# Patient Record
Sex: Female | Born: 1985 | Race: White | Hispanic: No | Marital: Single | Smoking: Light tobacco smoker
Health system: Southern US, Community
[De-identification: ages and names within clinical notes are randomized; demographics above are authoritative.]

## PROBLEM LIST (undated history)

## (undated) ENCOUNTER — Inpatient Hospital Stay (HOSPITAL_COMMUNITY): Payer: Self-pay

## (undated) DIAGNOSIS — F909 Attention-deficit hyperactivity disorder, unspecified type: Secondary | ICD-10-CM

## (undated) DIAGNOSIS — F419 Anxiety disorder, unspecified: Secondary | ICD-10-CM

## (undated) DIAGNOSIS — I079 Rheumatic tricuspid valve disease, unspecified: Secondary | ICD-10-CM

## (undated) DIAGNOSIS — F1990 Other psychoactive substance use, unspecified, uncomplicated: Secondary | ICD-10-CM

## (undated) DIAGNOSIS — I330 Acute and subacute infective endocarditis: Secondary | ICD-10-CM

## (undated) DIAGNOSIS — F429 Obsessive-compulsive disorder, unspecified: Secondary | ICD-10-CM

## (undated) DIAGNOSIS — I059 Rheumatic mitral valve disease, unspecified: Secondary | ICD-10-CM

## (undated) DIAGNOSIS — Z3490 Encounter for supervision of normal pregnancy, unspecified, unspecified trimester: Secondary | ICD-10-CM

## (undated) DIAGNOSIS — F4481 Dissociative identity disorder: Secondary | ICD-10-CM

## (undated) DIAGNOSIS — M419 Scoliosis, unspecified: Secondary | ICD-10-CM

## (undated) DIAGNOSIS — F3112 Bipolar disorder, current episode manic without psychotic features, moderate: Secondary | ICD-10-CM

## (undated) HISTORY — PX: KIDNEY STONE SURGERY: SHX686

---

## 2015-01-19 NOTE — L&D Delivery Note (Signed)
Delivery Note Pt experienced recurrent variable decels to FHR in 60s.  Pt's anterior lip was reduced after brief second stage.  At 11:01 PM a viable female was delivered via Vaginal, Spontaneous Delivery LOA.  APGAR:8,9 weight pending. Placenta status: intact, spontaneous delivery   3 vessel Cord: Delayed cord clamping after 1 minute.  Anesthesia:  epidural Episiotomy: None Lacerations: None Suture Repair: none Est. Blood Loss (mL): 100  Mom to postpartum.  Baby to Couplet care / Skin to Skin.  Clayton BiblesSamantha Anastasio Wogan, SNM 11/06/2015, 11:18 PM

## 2015-09-26 ENCOUNTER — Encounter (HOSPITAL_COMMUNITY): Payer: Self-pay | Admitting: *Deleted

## 2015-09-26 ENCOUNTER — Inpatient Hospital Stay (HOSPITAL_COMMUNITY)
Admission: AD | Admit: 2015-09-26 | Discharge: 2015-09-26 | Disposition: A | Discharge status: Home / Self Care | Payer: Medicaid Other | Source: Ambulatory Visit | Attending: Obstetrics & Gynecology | Admitting: Obstetrics & Gynecology

## 2015-09-26 DIAGNOSIS — O99323 Drug use complicating pregnancy, third trimester: Secondary | ICD-10-CM | POA: Diagnosis not present

## 2015-09-26 DIAGNOSIS — O99333 Smoking (tobacco) complicating pregnancy, third trimester: Secondary | ICD-10-CM | POA: Insufficient documentation

## 2015-09-26 DIAGNOSIS — N898 Other specified noninflammatory disorders of vagina: Secondary | ICD-10-CM | POA: Diagnosis not present

## 2015-09-26 DIAGNOSIS — O26899 Other specified pregnancy related conditions, unspecified trimester: Secondary | ICD-10-CM

## 2015-09-26 DIAGNOSIS — M419 Scoliosis, unspecified: Secondary | ICD-10-CM | POA: Diagnosis not present

## 2015-09-26 DIAGNOSIS — O26893 Other specified pregnancy related conditions, third trimester: Secondary | ICD-10-CM | POA: Diagnosis not present

## 2015-09-26 DIAGNOSIS — R109 Unspecified abdominal pain: Secondary | ICD-10-CM | POA: Diagnosis present

## 2015-09-26 DIAGNOSIS — Z888 Allergy status to other drugs, medicaments and biological substances status: Secondary | ICD-10-CM | POA: Diagnosis not present

## 2015-09-26 DIAGNOSIS — Z3A33 33 weeks gestation of pregnancy: Secondary | ICD-10-CM | POA: Diagnosis not present

## 2015-09-26 DIAGNOSIS — Z79899 Other long term (current) drug therapy: Secondary | ICD-10-CM | POA: Diagnosis not present

## 2015-09-26 DIAGNOSIS — Z3689 Encounter for other specified antenatal screening: Secondary | ICD-10-CM

## 2015-09-26 DIAGNOSIS — O99320 Drug use complicating pregnancy, unspecified trimester: Secondary | ICD-10-CM | POA: Diagnosis present

## 2015-09-26 HISTORY — DX: Scoliosis, unspecified: M41.9

## 2015-09-26 LAB — URINALYSIS, DIPSTICK ONLY
BILIRUBIN URINE: NEGATIVE
GLUCOSE, UA: NEGATIVE mg/dL
Ketones, ur: NEGATIVE mg/dL
Nitrite: NEGATIVE
PROTEIN: NEGATIVE mg/dL
pH: 6 (ref 5.0–8.0)

## 2015-09-26 LAB — DIFFERENTIAL
Basophils Absolute: 0 10*3/uL (ref 0.0–0.1)
Basophils Relative: 0 %
EOS ABS: 0 10*3/uL (ref 0.0–0.7)
EOS PCT: 0 %
Lymphocytes Relative: 23 %
Lymphs Abs: 2.1 10*3/uL (ref 0.7–4.0)
MONO ABS: 0.2 10*3/uL (ref 0.1–1.0)
Monocytes Relative: 3 %
NEUTROS PCT: 74 %
Neutro Abs: 6.6 10*3/uL (ref 1.7–7.7)

## 2015-09-26 LAB — OB RESULTS CONSOLE ANTIBODY SCREEN: Antibody Screen: NEGATIVE

## 2015-09-26 LAB — CBC
HCT: 34.3 % — ABNORMAL LOW (ref 36.0–46.0)
Hemoglobin: 11.7 g/dL — ABNORMAL LOW (ref 12.0–15.0)
MCH: 30 pg (ref 26.0–34.0)
MCHC: 34.1 g/dL (ref 30.0–36.0)
MCV: 87.9 fL (ref 78.0–100.0)
PLATELETS: 175 10*3/uL (ref 150–400)
RBC: 3.9 MIL/uL (ref 3.87–5.11)
RDW: 12.8 % (ref 11.5–15.5)
WBC: 8.9 10*3/uL (ref 4.0–10.5)

## 2015-09-26 LAB — OB RESULTS CONSOLE ABO/RH

## 2015-09-26 LAB — RAPID URINE DRUG SCREEN, HOSP PERFORMED
Amphetamines: NOT DETECTED
BARBITURATES: NOT DETECTED
Benzodiazepines: NOT DETECTED
Cocaine: NOT DETECTED
Opiates: POSITIVE — AB
Tetrahydrocannabinol: POSITIVE — AB

## 2015-09-26 LAB — WET PREP, GENITAL
CLUE CELLS WET PREP: NONE SEEN
Sperm: NONE SEEN
TRICH WET PREP: NONE SEEN
YEAST WET PREP: NONE SEEN

## 2015-09-26 LAB — OB RESULTS CONSOLE RPR: RPR: NONREACTIVE

## 2015-09-26 NOTE — MAU Note (Signed)
Has had some PNC in [place]. Sporadic care. No care in [place]. Today having some abd pain in lower abd and some lower back pain. Feel like I'm leaking fld for last 2 days and alittle more today.

## 2015-09-26 NOTE — MAU Provider Note (Signed)
History     CSN: [id number]  Arrival date and time: 09/26/15 1843   First Provider Initiated Contact with Patient 09/26/15 2001      Chief Complaint  Patient presents with  . Abdominal Pain  . Vaginal Discharge   30 y.o. [id number] at [redacted]w[redacted]d here for evaluation of leaking of fluid and vaginal discharge for three days; noticed white-clear thin discharge occasionally over the past few days. She has minimal prenatal care, just moved from [place]. Reports seeing a midwife a few times, but mostly going to ER multiple times "to make sure everything is okay".  No bleeding. Also reports mild lower abdominal cramping occasionally.  No contractions. Good fetal movement.   Obstetric History   G5   P0   T0   P0   A3   L1    SAB0   TAB0   Ectopic0   Multiple0   Live Births1     # Outcome Date GA Lbr Len/2nd Weight Sex Delivery Anes PTL Lv  5 Current           4 Gravida 2007     Vag-Spont  Y LIV  3 AB           2 AB           1 AB                Past Medical History:  Diagnosis Date  . Anemia   . Scoliosis     Past Surgical History:  Procedure Laterality Date  . KIDNEY STONE SURGERY      Family History  Problem Relation Age of Onset  . Hypertension Mother   . Hyperlipidemia Father     Social History  Substance Use Topics  . Smoking status: Light Tobacco Smoker    Types: Cigarettes  . Smokeless tobacco: Never Used     Comment: uses vape pen primarily now  . Alcohol use Yes     Comment: not since found out was pregnant at 2 mths.    Allergies:  Allergies  Allergen Reactions  . Latex Swelling    Prescriptions Prior to Admission  Medication Sig Dispense Refill Last Dose  . Prenatal Vit-Fe Fumarate-FA (PRENATAL MULTIVITAMIN) TABS tablet Take 1 tablet by mouth at bedtime.    09/25/2015 at Unknown time  Reports recent Vicodin use for tooth pain  ROS Physical Exam   Blood pressure 94/74, pulse 94, temperature 97.9 F (36.6 C), resp. rate 18, height 5' (1.524 m), weight 113 lb  0.6 oz (51.3 kg), SpO2 98 %. Reactive FHR tracing - Category I No contractions on tocometer  Physical Exam  Constitutional: She is oriented to person, place, and time. She appears well-developed and well-nourished. No distress.  HENT:  Head: Normocephalic and atraumatic.  Eyes: EOM are normal. Pupils are equal, round, and reactive to light.  Neck: Normal range of motion. Neck supple.  Cardiovascular: Normal rate and regular rhythm.   Respiratory: Effort normal and breath sounds normal.  GI: Soft. There is no tenderness.  Gravid, patient pointed to bilateral groin areas as points of occasional tenderness  Genitourinary: Uterus normal. Vaginal discharge found.  Genitourinary Comments: No evidence of amniotic fluid. Scant white discharge seen, wet prep and GC/Chlam samples obtained.  Musculoskeletal: Normal range of motion.  Neurological: She is alert and oriented to person, place, and time.  Skin: Skin is warm and dry.    MAU Course  Procedures  MDM Prenatal labs drawn Pelvic cultures pending  Results  for orders placed or performed during the hospital encounter of 09/26/15 (from the past 24 hour(s))  Urine rapid drug screen (hosp performed)     Status: Abnormal   Collection Time: 09/26/15  7:25 PM  Result Value Ref Range   Opiates POSITIVE (A) NONE DETECTED   Cocaine NONE DETECTED NONE DETECTED   Benzodiazepines NONE DETECTED NONE DETECTED   Amphetamines NONE DETECTED NONE DETECTED   Tetrahydrocannabinol POSITIVE (A) NONE DETECTED   Barbiturates NONE DETECTED NONE DETECTED  Urinalysis, dipstick only     Status: Abnormal   Collection Time: 09/26/15  7:25 PM  Result Value Ref Range   Color, Urine YELLOW YELLOW   APPearance CLEAR CLEAR   Specific Gravity, Urine >1.030 (H) 1.005 - 1.030   pH 6.0 5.0 - 8.0   Glucose, UA NEGATIVE NEGATIVE mg/dL   Hgb urine dipstick TRACE (A) NEGATIVE   Bilirubin Urine NEGATIVE NEGATIVE   Ketones, ur NEGATIVE NEGATIVE mg/dL   Protein, ur  NEGATIVE NEGATIVE mg/dL   Nitrite NEGATIVE NEGATIVE   Leukocytes, UA TRACE (A) NEGATIVE  Wet prep, genital     Status: Abnormal   Collection Time: 09/26/15  8:19 PM  Result Value Ref Range   Yeast Wet Prep HPF POC NONE SEEN NONE SEEN   Trich, Wet Prep NONE SEEN NONE SEEN   Clue Cells Wet Prep HPF POC NONE SEEN NONE SEEN   WBC, Wet Prep HPF POC MODERATE (A) NONE SEEN   Sperm NONE SEEN   CBC     Status: Abnormal   Collection Time: 09/26/15  8:36 PM  Result Value Ref Range   WBC 8.9 4.0 - 10.5 K/uL   RBC 3.90 3.87 - 5.11 MIL/uL   Hemoglobin 11.7 (L) 12.0 - 15.0 g/dL   HCT 46.934.3 (L) 62.936.0 - 52.846.0 %   MCV 87.9 78.0 - 100.0 fL   MCH 30.0 26.0 - 34.0 pg   MCHC 34.1 30.0 - 36.0 g/dL   RDW 41.312.8 24.411.5 - 01.015.5 %   Platelets 175 150 - 400 K/uL  Differential     Status: None (Preliminary result)   Collection Time: 09/26/15  8:36 PM  Result Value Ref Range   Neutrophils Relative % 74 %   Neutro Abs 6.6 1.7 - 7.7 K/uL   Lymphocytes Relative 23 %   Lymphs Abs 2.1 0.7 - 4.0 K/uL   Monocytes Relative 3 %   Monocytes Absolute 0.2 0.1 - 1.0 K/uL   Eosinophils Relative 0 %   Eosinophils Absolute 0.0 0.0 - 0.7 K/uL   Basophils Relative 0 %   Basophils Absolute 0.0 0.0 - 0.1 K/uL   Other PENDING %    Assessment and Plan  Vaginal discharge in pregnancy, negative evaluation. No intervention needed.   Abdominal cramping likely round ligament pain, rare Braxton-Hicks. UA negative.  Patient cautioned about opiate and THC use in pregnancy.  Anatomy scan ordered as outpatient. Will send message to clinic to get her an appointment soon. Preterm labor and fetal movement precautions advised.  Tereso NewcomerANYANWU,Ahmiya Abee A, MD 09/26/2015, 9:02 PM

## 2015-09-26 NOTE — Discharge Instructions (Signed)

## 2015-09-27 LAB — HEMOGLOBIN A1C
Hgb A1c MFr Bld: 5.2 % (ref 4.8–5.6)
MEAN PLASMA GLUCOSE: 103 mg/dL

## 2015-09-27 LAB — HEPATITIS B SURFACE ANTIGEN: HEP B S AG: NEGATIVE

## 2015-09-27 LAB — ABO/RH: ABO/RH(D): O POS

## 2015-09-27 LAB — RPR: RPR: NONREACTIVE

## 2015-09-27 LAB — HIV ANTIBODY (ROUTINE TESTING W REFLEX): HIV SCREEN 4TH GENERATION: NONREACTIVE

## 2015-09-27 LAB — RUBELLA SCREEN: Rubella: 2.71 index (ref >0.99)

## 2015-09-29 LAB — GC/CHLAMYDIA PROBE AMP (~~LOC~~) NOT AT ARMC
Chlamydia: NEGATIVE
Neisseria Gonorrhea: NEGATIVE

## 2015-10-01 ENCOUNTER — Other Ambulatory Visit: Payer: Self-pay | Admitting: Family

## 2015-10-01 ENCOUNTER — Ambulatory Visit (INDEPENDENT_AMBULATORY_CARE_PROVIDER_SITE_OTHER): Payer: Medicaid Other | Admitting: Family

## 2015-10-01 ENCOUNTER — Ambulatory Visit (INDEPENDENT_AMBULATORY_CARE_PROVIDER_SITE_OTHER): Payer: Self-pay | Admitting: Clinical

## 2015-10-01 ENCOUNTER — Encounter: Payer: Self-pay | Admitting: Family

## 2015-10-01 VITALS — BP 100/63 | HR 82 | Wt 114.0 lb

## 2015-10-01 DIAGNOSIS — F3162 Bipolar disorder, current episode mixed, moderate: Secondary | ICD-10-CM

## 2015-10-01 DIAGNOSIS — Z23 Encounter for immunization: Secondary | ICD-10-CM | POA: Diagnosis not present

## 2015-10-01 DIAGNOSIS — O09213 Supervision of pregnancy with history of pre-term labor, third trimester: Secondary | ICD-10-CM

## 2015-10-01 DIAGNOSIS — O09893 Supervision of other high risk pregnancies, third trimester: Secondary | ICD-10-CM

## 2015-10-01 DIAGNOSIS — O0990 Supervision of high risk pregnancy, unspecified, unspecified trimester: Secondary | ICD-10-CM | POA: Insufficient documentation

## 2015-10-01 DIAGNOSIS — O0933 Supervision of pregnancy with insufficient antenatal care, third trimester: Secondary | ICD-10-CM

## 2015-10-01 DIAGNOSIS — O0993 Supervision of high risk pregnancy, unspecified, third trimester: Secondary | ICD-10-CM

## 2015-10-01 MED ORDER — TETANUS-DIPHTH-ACELL PERTUSSIS 5-2.5-18.5 LF-MCG/0.5 IM SUSP
0.5000 mL | Freq: Once | INTRAMUSCULAR | Status: AC
  Administered 2015-10-01: 0.5 mL via INTRAMUSCULAR

## 2015-10-01 NOTE — Patient Instructions (Addendum)
Third Trimester of Pregnancy The third trimester is from week 29 through week 42, months 7 through 9. The third trimester is a time when the fetus is growing rapidly. At the end of the ninth month, the fetus is about 20 inches in length and weighs 6-10 pounds.  BODY CHANGES Your body goes through many changes during pregnancy. The changes vary from woman to woman.   Your weight will continue to increase. You can expect to gain 25-35 pounds (11-16 kg) by the end of the pregnancy.  You may begin to get stretch marks on your hips, abdomen, and breasts.  You may urinate more often because the fetus is moving lower into your pelvis and pressing on your bladder.  You may develop or continue to have heartburn as a result of your pregnancy.  You may develop constipation because certain hormones are causing the muscles that push waste through your intestines to slow down.  You may develop hemorrhoids or swollen, bulging veins (varicose veins).  You may have pelvic pain because of the weight gain and pregnancy hormones relaxing your joints between the bones in your pelvis. Backaches may result from overexertion of the muscles supporting your posture.  You may have changes in your hair. These can include thickening of your hair, rapid growth, and changes in texture. Some women also have hair loss during or after pregnancy, or hair that feels dry or thin. Your hair will most likely return to normal after your baby is born.  Your breasts will continue to grow and be tender. A yellow discharge may leak from your breasts called colostrum.  Your belly button may stick out.  You may feel short of breath because of your expanding uterus.  You may notice the fetus "dropping," or moving lower in your abdomen.  You may have a bloody mucus discharge. This usually occurs a few days to a week before labor begins.  Your cervix becomes thin and soft (effaced) near your due date. WHAT TO EXPECT AT YOUR  PRENATAL EXAMS  You will have prenatal exams every 2 weeks until week 36. Then, you will have weekly prenatal exams. During a routine prenatal visit:  You will be weighed to make sure you and the fetus are growing normally.  Your blood pressure is taken.  Your abdomen will be measured to track your baby's growth.  The fetal heartbeat will be listened to.  Any test results from the previous visit will be discussed.  You may have a cervical check near your due date to see if you have effaced. At around 36 weeks, your caregiver will check your cervix. At the same time, your caregiver will also perform a test on the secretions of the vaginal tissue. This test is to determine if a type of bacteria, Group B streptococcus, is present. Your caregiver will explain this further. Your caregiver may ask you:  What your birth plan is.  How you are feeling.  If you are feeling the baby move.  If you have had any abnormal symptoms, such as leaking fluid, bleeding, severe headaches, or abdominal cramping.  If you are using any tobacco products, including cigarettes, chewing tobacco, and electronic cigarettes.  If you have any questions. Other tests or screenings that may be performed during your third trimester include:  Blood tests that check for low iron levels (anemia).  Fetal testing to check the health, activity level, and growth of the fetus. Testing is done if you have certain medical conditions or if   there are problems during the pregnancy.  HIV (human immunodeficiency virus) testing. If you are at high risk, you may be screened for HIV during your third trimester of pregnancy. FALSE LABOR You may feel small, irregular contractions that eventually go away. These are called Braxton Hicks contractions, or false labor. Contractions may last for hours, days, or even weeks before true labor sets in. If contractions come at regular intervals, intensify, or become painful, it is best to be seen  by your caregiver.  SIGNS OF LABOR   Menstrual-like cramps.  Contractions that are 5 minutes apart or less.  Contractions that start on the top of the uterus and spread down to the lower abdomen and back.  A sense of increased pelvic pressure or back pain.  A watery or bloody mucus discharge that comes from the vagina. If you have any of these signs before the 37th week of pregnancy, call your caregiver right away. You need to go to the hospital to get checked immediately. HOME CARE INSTRUCTIONS   Avoid all smoking, herbs, alcohol, and unprescribed drugs. These chemicals affect the formation and growth of the baby.  Do not use any tobacco products, including cigarettes, chewing tobacco, and electronic cigarettes. If you need help quitting, ask your health care provider. You may receive counseling support and other resources to help you quit.  Follow your caregiver's instructions regarding medicine use. There are medicines that are either safe or unsafe to take during pregnancy.  Exercise only as directed by your caregiver. Experiencing uterine cramps is a good sign to stop exercising.  Continue to eat regular, healthy meals.  Wear a good support bra for breast tenderness.  Do not use hot tubs, steam rooms, or saunas.  Wear your seat belt at all times when driving.  Avoid raw meat, uncooked cheese, cat litter boxes, and soil used by cats. These carry germs that can cause birth defects in the baby.  Take your prenatal vitamins.  Take 1500-2000 mg of calcium daily starting at the 20th week of pregnancy until you deliver your baby.  Try taking a stool softener (if your caregiver approves) if you develop constipation. Eat more high-fiber foods, such as fresh vegetables or fruit and whole grains. Drink plenty of fluids to keep your urine clear or pale yellow.  Take warm sitz baths to soothe any pain or discomfort caused by hemorrhoids. Use hemorrhoid cream if your caregiver  approves.  If you develop varicose veins, wear support hose. Elevate your feet for 15 minutes, 3-4 times a day. Limit salt in your diet.  Avoid heavy lifting, wear low heal shoes, and practice good posture.  Rest a lot with your legs elevated if you have leg cramps or low back pain.  Visit your dentist if you have not gone during your pregnancy. Use a soft toothbrush to brush your teeth and be gentle when you floss.  A sexual relationship may be continued unless your caregiver directs you otherwise.  Do not travel far distances unless it is absolutely necessary and only with the approval of your caregiver.  Take prenatal classes to understand, practice, and ask questions about the labor and delivery.  Make a trial run to the hospital.  Pack your hospital bag.  Prepare the baby's nursery.  Continue to go to all your prenatal visits as directed by your caregiver. SEEK MEDICAL CARE IF:  You are unsure if you are in labor or if your water has broken.  You have dizziness.  You have   mild pelvic cramps, pelvic pressure, or nagging pain in your abdominal area.  You have persistent nausea, vomiting, or diarrhea.  You have a bad smelling vaginal discharge.  You have pain with urination. SEEK IMMEDIATE MEDICAL CARE IF:   You have a fever.  You are leaking fluid from your vagina.  You have spotting or bleeding from your vagina.  You have severe abdominal cramping or pain.  You have rapid weight loss or gain.  You have shortness of breath with chest pain.  You notice sudden or extreme swelling of your face, hands, ankles, feet, or legs.  You have not felt your baby move in over an hour.  You have severe headaches that do not go away with medicine.  You have vision changes.   This information is not intended to replace advice given to you by your health care provider. Make sure you discuss any questions you have with your health care provider.   Document Released:  12/29/2000 Document Revised: 01/25/2014 Document Reviewed: 03/07/2012 Elsevier Interactive Patient Education 2016 Elsevier Inc.  Group B streptococcus (GBS) is a type of bacteria often found in healthy women. GBS is not the same as the bacteria that causes strep throat. You may have GBS in your vagina, rectum, or bladder. GBS does not spread through sexual contact, but it can be passed to a baby during childbirth. This can be dangerous for your baby. It is not dangerous to you and usually does not cause any symptoms. Your health care provider may test you for GBS when your pregnancy is between 35 and 37 weeks. GBS is dangerous only during birth, so there is no need to test for it earlier. It is possible to have GBS during pregnancy and never pass it to your baby. If your test results are positive for GBS, your health care provider may recommend giving you antibiotic medicine during delivery to make sure your baby stays healthy. RISK FACTORS You are more likely to pass GBS to your baby if:   Your water breaks (ruptured membrane) or you go into labor before 37 weeks.  Your water breaks 18 hours before you deliver.  You passed GBS during a previous pregnancy.  You have a urinary tract infection caused by GBS any time during pregnancy.  You have a fever during labor. SYMPTOMS Most women who have GBS do not have any symptoms. If you have a urinary tract infection caused by GBS, you might have frequent or painful urination and fever. Babies who get GBS usually show symptoms within 7 days of birth. Symptoms may include:   Breathing problems.  Heart and blood pressure problems.  Digestive and kidney problems. DIAGNOSIS Routine screening for GBS is recommended for all pregnant women. A health care provider takes a sample of the fluid in your vagina and rectum with a swab. It is then sent to a lab to be checked for GBS. A sample of your urine may also be checked for the bacteria.  TREATMENT If  you test positive for GBS, you may need treatment with an antibiotic medicine during labor. As soon as you go into labor, or as soon as your membranes rupture, you will get the antibiotic medicine through an IV access. You will continue to get the medicine until after you give birth. You do not need antibiotic medicine if you are having a cesarean delivery.If your baby shows signs or symptoms of GBS after birth, your baby can also be treated with an antibiotic medicine. HOME CARE  INSTRUCTIONS   Take all antibiotic medicine as prescribed by your health care provider. Only take medicine as directed.   Continue with prenatal visits and care.   Keep all follow-up appointments.  SEEK MEDICAL CARE IF:   You have pain when you urinate.   You have to urinate frequently.   You have a fever.  SEEK IMMEDIATE MEDICAL CARE IF:   Your membranes rupture.  You go into labor.   This information is not intended to replace advice given to you by your health care provider. Make sure you discuss any questions you have with your health care provider.   Document Released: 04/13/2007 Document Revised: 01/09/2013 Document Reviewed: 10/27/2012 Elsevier Interactive Patient Education 2016 [employer]  [facility]  [facility] [address], [address] [place], [place]  [address] Phone - [phone]   Fax - [phone]  [facility] [address] [place], [place] [address] Phone - [phone]   Fax - [phone]  JACK AMOS [address] [place], [place]  [address] Phone - [phone]   Fax - [phone]  [facility] [address], [address] [place], [place]  [address] Phone - [phone]   Fax - [phone]  [facility] [address] [place], [place]  [address] Phone - [phone]   Fax - [phone]  [facility] [address], [address] [place], [place]   [address] Phone - [phone]   Fax - [phone]  [facility] [address], [address] [place], [place]  [address] Phone - [phone]   Fax - [phone]  [facility] [address], [address] [place], [place]  [address] Phone - [phone]   Fax - [phone]  [facility] [address] [place], [place]  [address] Phone - [phone]   Fax - [phone] [facility] [address] [place], [place]  [address] Phone - [phone]   Fax - [phone]  [facility] [address] [place], [place]  [address] Phone - [phone]   Fax - [phone]  [facility] [address], [address] [place], [place]  [address] Phone - [phone]   Fax - [phone]  [facility] [address], [address] [place], [place]  [address] Phone - [phone]   Fax - [phone]  [facility] [address], [address] [place], [place]  [address] Phone - [phone]  [facility] [address] [place] , [place]  [address] Phone - [phone]   Fax - [phone]  [facility] [address], [address] [place], [place]  [address] Phone - [phone]   Fax - [phone]  [facility] [address] [place], [place]  [address] Phone - [phone]   Fax - [phone]  [facility] [address] [place], [place]  [address] Phone - [phone]   Fax - [phone]   [facility] [address], [address] [place], [place]  [address] Phone - [phone]   Fax - [phone]  [facility] [address], [address] [place], [place]  [address] Phone - [phone]   Fax - [phone]  DAVID RUBIN [address], [address] [place], [place]  [address] Phone - [phone]   Fax - [phone]  [facility] [address], [address] [place], [place]  [address] Phone - [phone]  Fax - [phone]  [facility] [address] [place], [place]  [address] Phone - [phone]   Fax - [phone] Gerarda Fraction [phone] [address] [place], [place]  [address] Phone - [phone]   Fax - [phone]  [facility] [address] [place], [place]  [address] Phone - [phone]   Fax - [phone]  [facility] [place] [address], [address] [place], [place]  [address] Phone - [phone]   Fax - [phone]

## 2015-10-01 NOTE — Addendum Note (Signed)
Addended by: Cheree DittoGRAHAM, DEMETRICE A on: 10/01/2015 10:41 AM   Modules accepted: Orders

## 2015-10-01 NOTE — Progress Notes (Signed)
  ASSESSMENT: Pt currently experiencing Bipolar affective disorder, current episode mixed,mild.Pt needs [facility]. Pt would benefit from psychoeducation and brief therapeutic intervention regarding coping with symptoms of bipolar disorder. Pt may benefit from additional community resources.  Stage of Change: contemplative  PLAN: 1. F/U with behavioral health clinician in two weeks, or as needed, for postpartum planning 2. Psychiatric Medications: none 3. Behavioral recommendations:   -Consider establishing care with psychiatry, as discussed in office visit -Consider substance use and opioid use outpatient treatment, as discussed in office visit -Consider [facility] as resource for asking questions about substance and BH meds in pregnancy and postpartum -Consider community resources, as discussed in office visit -Consider reading educational material regarding coping with symptoms of anxiety and depression, as they relate to bipolar disorder  SUBJECTIVE: Pt. referred by Rochele PagesWalidah Karim, CNM, for symptoms of anxiety and depression Pt. reports the following symptoms/concerns: Pt states that she has been treated in past for bipolar disorder, but that she does not like medication; family history of bipolar with postpartum depression(mother), but pt did not experience any postpartum mood issues after last pregnancy. Pt copes by "going into the backyard and screaming until I feel better". Pt [facility] and substance treatment in area, but not interested in going at this time.  Duration of problem: Undetermined number of years Severity: mild   OBJECTIVE: Orientation & Cognition: Oriented x3. Thought processes normal and appropriate to situation. Mood: appropriate Affect: appropriate Appearance: appropriate Risk of harm to self or others: no known risk of harm to self or others, no SI, no HI Substance use: opioids and marijuana(in past  history) Assessments administered: PHQ9: 14/ GAD7: 15  Diagnosis: Bipolar disorder, current episode mixed, mild CPT Code: F31.62  -------------------------------------------- Other(s) present in the room: none  Time spent with patient in exam room: 30 minutes, 9:30-10:00am  Dep[facility] screen [facility] 10/01/2015  Decreased Interest 2  Down, Depressed, Hopeless 1  PHQ - 2 Score 3  Altered sleeping 3  Tired, decreased energy 3  Change in appetite 3  Feeling bad or failure about yourself  0  Trouble concentrating 1  Moving slowly or fidgety/restless 1  Suicidal thoughts 0  PHQ-9 Score 14   GAD 7 : Generalized Anxiety Score 10/01/2015  Nervous, Anxious, on Edge 2  Control/stop worrying 3  Worry too much - different things 3  Trouble relaxing 3  Restless 1  Easily annoyed or irritable 2  Afraid - awful might happen 1  Total GAD 7 Score 15

## 2015-10-01 NOTE — Progress Notes (Signed)
  Subjective:    Summer Cain is a [id number] 2441w1d being seen today for her first obstetrical visit.  Her obstetrical history is significant for late prenatatal care, +opiates/marijuana, decreased appetite in early pregnancy, and history of preterm delivery.  Pt states pregnancy was induced due to baby being too small (IUGR??).  Patient does intend to breast feed. Pregnancy history fully reviewed.  Patient reports backache.  Vitals:   10/01/15 0857  BP: 100/63  Pulse: 82  Weight: 114 lb (51.7 kg)    HISTORY: OB History  Gravida Para Term Preterm AB Living  5 1 0 1 3 1   SAB TAB Ectopic Multiple Live Births  2 1 0 0 1    # Outcome Date GA Lbr Len/2nd Weight Sex Delivery Anes PTL Lv  5 Current           4 Preterm 06/09/05 2322w0d  6 lb 1 oz (2.75 kg) M Vag-Spont  N LIV  3 TAB           2 SAB           1 SAB              Past Medical History:  Diagnosis Date  . Scoliosis    Past Surgical History:  Procedure Laterality Date  . KIDNEY STONE SURGERY     Family History  Problem Relation Age of Onset  . Hypertension Mother   . Depression Mother   . Hyperlipidemia Father   . Diabetes Paternal Grandmother      Exam   Vitals:   10/01/15 0857  BP: 100/63  Pulse: 82  Weight: 114 lb (51.7 kg)    Fetal Status: Fetal Heart Rate (bpm): 146   Movement: Present     General:  Alert, oriented and cooperative. Patient is in no acute distress.  Skin: Skin is warm and dry. No rash noted.   Cardiovascular: Normal heart rate noted  Respiratory: Normal respiratory effort, no problems with respiration noted  Abdomen: Soft, gravid, appropriate for gestational age. Pain/Pressure: Present     Vaginal: Vag. Bleeding: None.       Cervix: Not evaluated        Extremities: Normal range of motion.  Edema: None  Mental Status: Normal mood and affect. Normal behavior. Normal judgment and thought content.   Urinalysis:     Urine results not available at discharge.      Assessment:    Pregnancy: [id number] Patient Active Problem List   Diagnosis Date Noted  . THC and opiate use complicating pregnancy, antepartum 09/26/2015     Plan:     Antibody lab drawn; 1 hr obtained Prenatal vitamins. Visit with Asher MuirJamie today [facility](Behavior Health) Problem list reviewed and updated. Genetic Screening discussed:  Late to care  Ultrasound discussed; fetal survey: ordered.  Marlis EdelsonKARIM, Nasiyah Laverdiere N 10/01/2015

## 2015-10-01 NOTE — Progress Notes (Signed)
Patient reports she was told to take CBD oil [place]. Patient was not aware that wasn't okay in 

## 2015-10-03 LAB — GLUCOSE TOLERANCE, 1 HOUR

## 2015-10-03 LAB — GLUCOSE TOLERANCE, 1 HOUR (50G) W/O FASTING: Glucose, 1 Hr, gestational: 102 mg/dL (ref <140)

## 2015-10-07 ENCOUNTER — Ambulatory Visit (HOSPITAL_COMMUNITY)
Admission: RE | Admit: 2015-10-07 | Discharge: 2015-10-07 | Disposition: A | Discharge status: Home / Self Care | Payer: Medicaid Other | Source: Ambulatory Visit | Attending: Obstetrics & Gynecology | Admitting: Obstetrics & Gynecology

## 2015-10-07 DIAGNOSIS — O99323 Drug use complicating pregnancy, third trimester: Secondary | ICD-10-CM | POA: Diagnosis not present

## 2015-10-07 DIAGNOSIS — Z3689 Encounter for other specified antenatal screening: Secondary | ICD-10-CM

## 2015-10-07 DIAGNOSIS — O0933 Supervision of pregnancy with insufficient antenatal care, third trimester: Secondary | ICD-10-CM | POA: Insufficient documentation

## 2015-10-07 DIAGNOSIS — O09213 Supervision of pregnancy with history of pre-term labor, third trimester: Secondary | ICD-10-CM | POA: Diagnosis not present

## 2015-10-07 DIAGNOSIS — Z3A35 35 weeks gestation of pregnancy: Secondary | ICD-10-CM | POA: Diagnosis not present

## 2015-10-08 ENCOUNTER — Other Ambulatory Visit: Payer: Self-pay | Admitting: Obstetrics & Gynecology

## 2015-10-08 DIAGNOSIS — O0933 Supervision of pregnancy with insufficient antenatal care, third trimester: Secondary | ICD-10-CM

## 2015-10-08 DIAGNOSIS — O99323 Drug use complicating pregnancy, third trimester: Secondary | ICD-10-CM

## 2015-10-08 NOTE — Progress Notes (Signed)
Anatomy scan normal with limitations due to late GA at 6371w1d but EFW 13%, normal AFV. Needs follow up in 3-4 weeks given insufficient prenatal care, opiate and THC use. Order placed, patient will be contacted with time and date of ultrasound. Message sent to clinic for her to be seen as a new patient as soon as possible.  Tereso NewcomerUgonna A Anyanwu, MD

## 2015-10-13 ENCOUNTER — Encounter: Payer: Self-pay | Admitting: *Deleted

## 2015-10-13 ENCOUNTER — Telehealth: Payer: Self-pay | Admitting: *Deleted

## 2015-10-13 NOTE — Telephone Encounter (Signed)
Aili called front desk and call transferred to nurse.  She c/o dental issues and can't get a dentist to see her.  I informed her she will need a dental letter which she can pick up at our office front desk.. I also gave her 2 dentists that will see patients with medicaid. She voices understanding. I also reviewed her next ob appt with her.

## 2015-10-17 NOTE — Progress Notes (Signed)
Patient has been scheduled for appt and u/s

## 2015-10-21 ENCOUNTER — Encounter: Payer: Self-pay | Admitting: Obstetrics and Gynecology

## 2015-10-21 ENCOUNTER — Ambulatory Visit (INDEPENDENT_AMBULATORY_CARE_PROVIDER_SITE_OTHER): Payer: Medicaid Other | Admitting: Obstetrics and Gynecology

## 2015-10-21 ENCOUNTER — Other Ambulatory Visit (HOSPITAL_COMMUNITY)
Admission: RE | Admit: 2015-10-21 | Discharge: 2015-10-21 | Disposition: A | Discharge status: Home / Self Care | Payer: Medicaid Other | Source: Ambulatory Visit | Attending: Obstetrics and Gynecology | Admitting: Obstetrics and Gynecology

## 2015-10-21 VITALS — BP 100/82 | HR 96 | Wt 117.3 lb

## 2015-10-21 DIAGNOSIS — Z113 Encounter for screening for infections with a predominantly sexual mode of transmission: Secondary | ICD-10-CM | POA: Insufficient documentation

## 2015-10-21 DIAGNOSIS — Z3A38 38 weeks gestation of pregnancy: Secondary | ICD-10-CM

## 2015-10-21 DIAGNOSIS — O9989 Other specified diseases and conditions complicating pregnancy, childbirth and the puerperium: Secondary | ICD-10-CM

## 2015-10-21 DIAGNOSIS — K047 Periapical abscess without sinus: Secondary | ICD-10-CM | POA: Insufficient documentation

## 2015-10-21 LAB — OB RESULTS CONSOLE GBS: GBS: NEGATIVE

## 2015-10-21 LAB — OB RESULTS CONSOLE GC/CHLAMYDIA: Gonorrhea: NEGATIVE

## 2015-10-21 NOTE — Patient Instructions (Signed)

## 2015-10-21 NOTE — Progress Notes (Signed)
   PRENATAL VISIT NOTE  Subjective:  Summer Cain is a 30 y.o. [phone] at 2264w1d being seen today for ongoing prenatal care.  She is currently monitored for the following issues for this low-risk pregnancy and has THC and opiate use complicating pregnancy, antepartum; Supervision of high risk pregnancy, antepartum; History of preterm delivery, currently pregnant in third trimester; Insufficient prenatal care in third trimester; and Dental infection on her problem list.  Patient reports dental pain with some fevers of 100. Takes Tylenol and Tylenol #3, Penicillin. Has seen dentists and surgery (extraction of 4 teeth) planned for postpartum  .  Marland Kitchen.  Movement: Present. Denies leaking of fluid.   The following portions of the patient's history were reviewed and updated as appropriate: allergies, current medications, past family history, past medical history, past social history, past surgical history and problem list. Problem list updated.  Objective:   Vitals:   10/21/15 1546  BP: 100/82  Pulse: 96  Weight: 117 lb 5 oz (53.2 kg)    Fetal Status: Fetal Heart Rate (bpm): 143   Movement: Present     General:  Alert, oriented and cooperative. Patient is in no acute distress.  Skin: Skin is warm and dry. No rash noted.   Cardiovascular: Normal heart rate noted  Respiratory: Normal respiratory effort, no problems with respiration noted  Abdomen: Soft, gravid, appropriate for gestational age. Pain/Pressure: Absent     Pelvic:  Cervical exam performed      1/L/H  Extremities: Normal range of motion.     Mental Status: Normal mood and affect. Normal behavior. Normal judgment and thought content.   Urinalysis:      Assessment and Plan:  Pregnancy: G5P0131 at 5764w1d [redacted] weeks gestation of pregnancy - Plan: GC/Chlamydia probe amp ([place])[facility], Culture, beta strep (group b only)  Dental infection   1. Dental infection Continue analgesics, not over 4gm acetaminophen /day,  and PCN.     Term labor symptoms and general obstetric precautions including but not limited to vaginal bleeding, contractions, leaking of fluid and fetal movement were reviewed in detail with the patient. Please refer to After Visit Summary for other counseling recommendations.  Return in about 1 week (around 10/28/2015).  Danae Orleanseirdre C Nasif Bos, CNM

## 2015-10-22 LAB — GC/CHLAMYDIA PROBE AMP (~~LOC~~) NOT AT ARMC
CHLAMYDIA, DNA PROBE: NEGATIVE
Neisseria Gonorrhea: NEGATIVE

## 2015-10-24 LAB — CULTURE, BETA STREP (GROUP B ONLY)

## 2015-10-28 ENCOUNTER — Other Ambulatory Visit: Payer: Self-pay | Admitting: Obstetrics & Gynecology

## 2015-10-28 ENCOUNTER — Ambulatory Visit (HOSPITAL_COMMUNITY)
Admission: RE | Admit: 2015-10-28 | Discharge: 2015-10-28 | Disposition: A | Discharge status: Home / Self Care | Payer: Medicaid Other | Source: Ambulatory Visit | Attending: Obstetrics & Gynecology | Admitting: Obstetrics & Gynecology

## 2015-10-28 ENCOUNTER — Other Ambulatory Visit (HOSPITAL_COMMUNITY): Payer: Self-pay | Admitting: *Deleted

## 2015-10-28 ENCOUNTER — Ambulatory Visit (INDEPENDENT_AMBULATORY_CARE_PROVIDER_SITE_OTHER): Payer: Medicaid Other | Admitting: Obstetrics & Gynecology

## 2015-10-28 ENCOUNTER — Ambulatory Visit (INDEPENDENT_AMBULATORY_CARE_PROVIDER_SITE_OTHER): Payer: Medicaid Other | Admitting: Clinical

## 2015-10-28 VITALS — BP 92/77 | HR 93 | Wt 118.0 lb

## 2015-10-28 DIAGNOSIS — F3162 Bipolar disorder, current episode mixed, moderate: Secondary | ICD-10-CM

## 2015-10-28 DIAGNOSIS — O0933 Supervision of pregnancy with insufficient antenatal care, third trimester: Secondary | ICD-10-CM

## 2015-10-28 DIAGNOSIS — O99323 Drug use complicating pregnancy, third trimester: Secondary | ICD-10-CM | POA: Diagnosis not present

## 2015-10-28 DIAGNOSIS — O09213 Supervision of pregnancy with history of pre-term labor, third trimester: Secondary | ICD-10-CM | POA: Diagnosis not present

## 2015-10-28 DIAGNOSIS — O0990 Supervision of high risk pregnancy, unspecified, unspecified trimester: Secondary | ICD-10-CM

## 2015-10-28 DIAGNOSIS — O0993 Supervision of high risk pregnancy, unspecified, third trimester: Secondary | ICD-10-CM

## 2015-10-28 DIAGNOSIS — Z3A38 38 weeks gestation of pregnancy: Secondary | ICD-10-CM | POA: Insufficient documentation

## 2015-10-28 DIAGNOSIS — O365930 Maternal care for other known or suspected poor fetal growth, third trimester, not applicable or unspecified: Secondary | ICD-10-CM

## 2015-10-28 DIAGNOSIS — O365931 Maternal care for other known or suspected poor fetal growth, third trimester, fetus 1: Secondary | ICD-10-CM

## 2015-10-28 NOTE — Patient Instructions (Signed)
Return to clinic for any scheduled appointments or obstetric concerns, or go to MAU for evaluation  

## 2015-10-28 NOTE — Progress Notes (Signed)
   PRENATAL VISIT NOTE  Subjective:  Rosine DoorHeather Hoelzel is a 30 y.o. [phone] at 2965w1d being seen today for ongoing prenatal care.  She is currently monitored for the following issues for this high-risk pregnancy and has THC and opiate use complicating pregnancy, antepartum; Supervision of high risk pregnancy, antepartum; History of preterm delivery, currently pregnant in third trimester; Insufficient prenatal care in third trimester; and Dental infection on her problem list.  Patient reports no complaints.  Contractions: Irregular.  .  Movement: Present. Denies leaking of fluid.   The following portions of the patient's history were reviewed and updated as appropriate: allergies, current medications, past family history, past medical history, past social history, past surgical history and problem list. Problem list updated.  Objective:   Vitals:   10/28/15 1532  BP: 92/77  Pulse: 93  Weight: 118 lb (53.5 kg)    Fetal Status: Fetal Heart Rate (bpm): 147 Fundal Height: 32 cm Movement: Present     General:  Alert, oriented and cooperative. Patient is in no acute distress.  Skin: Skin is warm and dry. No rash noted.   Cardiovascular: Normal heart rate noted  Respiratory: Normal respiratory effort, no problems with respiration noted  Abdomen: Soft, gravid, appropriate for gestational age. Pain/Pressure: Absent     Pelvic:  Cervical exam deferred        Extremities: Normal range of motion.  Edema: None  Mental Status: Normal mood and affect. Normal behavior. Normal judgment and thought content.   Urinalysis:      Assessment and Plan:  Pregnancy: G5P0131 at 8465w1d  1. IUGR (intrauterine growth restriction) affecting care of mother, third trimester, fetus 1 Will follow up today's scan; dopplers and BPP also done. Will follow up MFM recommendations.  2. Late prenatal care affecting pregnancy, antepartum, third trimester  3. Supervision of high risk pregnancy, antepartum, third  trimester Preterm labor symptoms and general obstetric precautions including but not limited to vaginal bleeding, contractions, leaking of fluid and fetal movement were reviewed in detail with the patient. Please refer to After Visit Summary for other counseling recommendations.  Return in about 1 week (around 11/04/2015) for OB Visit.  Tereso NewcomerUgonna A Anyanwu, MD

## 2015-10-28 NOTE — Progress Notes (Signed)
Session Start time: 4:30   End Time: 4:50pm Total Time:  20 minutes Type of Service: Behavioral Health - Individual/Family Interpreter: No.   Interpreter Name & Language: n/a # [facility]   SUBJECTIVE: Summer Cain is a 30 y.o. female  Pt. f/u for:  anxiety and depression. Pt. reports the following symptoms/concerns: Pt states that she is not concerned with symptoms of anxiety or depression today; greatest concern today is desire to give birth soon, so she may get dental work completed. Pt currently expresses pain in mouth and back; will cope by finalizing nursery/baby furniture in upcoming week, and walk dog daily. Duration of problem:  Over three months  Severity: moderate Previous treatment: treated in past, not interested in treatment now   OBJECTIVE: Mood: Appropriate & Affect: Appropriate Risk of harm to self or others: no known risk of harm to self or others Assessments administered: PHQ9: 15/ GAD7: 13  LIFE CONTEXT:  Family & Social: Lives with husband, child, Oncologistdog School/ Work: no work or school current; FOB supports Self-Care: opioids, not current/ walks dog daily, sleeps well  Life changes: Current pregnancy What is important to pt/family (values): Family, dog, being pain free all important   GOALS ADDRESSED:  -Alleviate symptoms of anxiety and depression to manageable level  INTERVENTIONS: Strength-based   ASSESSMENT:  Pt currently experiencing Bipolar disorder, mixed, mild.  Pt may benefit from brief therapeutic intervention regarding coping with symptoms of anxiety and depression.     PLAN: 1. F/U with behavioral health clinician: At postpartum visit 2. Behavioral Health meds: none 3. Behavioral recommendations:  -Continue taking dog on daily walks to distract from pain -Work with FOB to finalize nursery this week -Continue using non-pharmacological methods of coping with dental pain(black tea bag between teeth, etc.) until after  birth -After birth, set up appointment with dentist for dental work -Consider re-establishing with psychiatry postpartum 4. Referral: Brief Counseling/Psychotherapy 5. From scale of 1-10, how likely are you to follow plan: uncertain   Gaynell FaceJamie C Mcmannes LCSWA Behavioral Health Clinician  Marlon PelWarmhandoff: no  Depression screen [facility] 2/9 10/28/2015 10/21/2015 10/01/2015  Decreased Interest 1 3 2   Down, Depressed, Hopeless 0 0 1  PHQ - 2 Score 1 3 3   Altered sleeping 3 3 3   Tired, decreased energy 3 3 3   Change in appetite 3 3 3   Feeling bad or failure about yourself  0 0 0  Trouble concentrating 2 3 1   Moving slowly or fidgety/restless 3 0 1  Suicidal thoughts 0 0 0  PHQ-9 Score 15 15 14    GAD 7 : Generalized Anxiety Score 10/28/2015 10/21/2015 10/01/2015  Nervous, Anxious, on Edge 2 3 2   Control/stop worrying 1 3 3   Worry too much - different things 1 3 3   Trouble relaxing 3 3 3   Restless 3 3 1   Easily annoyed or irritable 3 3 2   Afraid - awful might happen 0 0 1  Total GAD 7 Score 13 18 15

## 2015-10-29 ENCOUNTER — Encounter: Payer: Self-pay | Admitting: Obstetrics & Gynecology

## 2015-10-29 DIAGNOSIS — O365931 Maternal care for other known or suspected poor fetal growth, third trimester, fetus 1: Secondary | ICD-10-CM | POA: Insufficient documentation

## 2015-11-04 ENCOUNTER — Encounter (HOSPITAL_COMMUNITY): Payer: Self-pay | Admitting: *Deleted

## 2015-11-04 ENCOUNTER — Inpatient Hospital Stay (HOSPITAL_COMMUNITY)
Admission: AD | Admit: 2015-11-04 | Discharge: 2015-11-04 | Disposition: A | Discharge status: Home / Self Care | Payer: Medicaid Other | Source: Ambulatory Visit | Attending: Obstetrics & Gynecology | Admitting: Obstetrics & Gynecology

## 2015-11-04 ENCOUNTER — Encounter (HOSPITAL_COMMUNITY): Payer: Self-pay

## 2015-11-04 ENCOUNTER — Ambulatory Visit (HOSPITAL_COMMUNITY)
Admission: RE | Admit: 2015-11-04 | Discharge: 2015-11-04 | Disposition: A | Discharge status: Home / Self Care | Payer: Medicaid Other | Source: Ambulatory Visit | Attending: Obstetrics & Gynecology | Admitting: Obstetrics & Gynecology

## 2015-11-04 ENCOUNTER — Ambulatory Visit (INDEPENDENT_AMBULATORY_CARE_PROVIDER_SITE_OTHER): Payer: Medicaid Other | Admitting: Advanced Practice Midwife

## 2015-11-04 ENCOUNTER — Other Ambulatory Visit (HOSPITAL_COMMUNITY): Payer: Self-pay | Admitting: Maternal and Fetal Medicine

## 2015-11-04 VITALS — BP 124/78 | HR 99 | Wt 122.1 lb

## 2015-11-04 DIAGNOSIS — Z3A39 39 weeks gestation of pregnancy: Secondary | ICD-10-CM | POA: Insufficient documentation

## 2015-11-04 DIAGNOSIS — O365930 Maternal care for other known or suspected poor fetal growth, third trimester, not applicable or unspecified: Secondary | ICD-10-CM | POA: Insufficient documentation

## 2015-11-04 DIAGNOSIS — O99323 Drug use complicating pregnancy, third trimester: Secondary | ICD-10-CM | POA: Diagnosis not present

## 2015-11-04 DIAGNOSIS — O0933 Supervision of pregnancy with insufficient antenatal care, third trimester: Secondary | ICD-10-CM | POA: Diagnosis not present

## 2015-11-04 DIAGNOSIS — R109 Unspecified abdominal pain: Secondary | ICD-10-CM

## 2015-11-04 DIAGNOSIS — O26893 Other specified pregnancy related conditions, third trimester: Secondary | ICD-10-CM | POA: Diagnosis not present

## 2015-11-04 DIAGNOSIS — O09213 Supervision of pregnancy with history of pre-term labor, third trimester: Secondary | ICD-10-CM | POA: Diagnosis not present

## 2015-11-04 DIAGNOSIS — O0990 Supervision of high risk pregnancy, unspecified, unspecified trimester: Secondary | ICD-10-CM

## 2015-11-04 DIAGNOSIS — O365931 Maternal care for other known or suspected poor fetal growth, third trimester, fetus 1: Secondary | ICD-10-CM

## 2015-11-04 DIAGNOSIS — O99320 Drug use complicating pregnancy, unspecified trimester: Secondary | ICD-10-CM

## 2015-11-04 DIAGNOSIS — O09893 Supervision of other high risk pregnancies, third trimester: Secondary | ICD-10-CM

## 2015-11-04 DIAGNOSIS — O99333 Smoking (tobacco) complicating pregnancy, third trimester: Secondary | ICD-10-CM | POA: Diagnosis not present

## 2015-11-04 DIAGNOSIS — F1910 Other psychoactive substance abuse, uncomplicated: Secondary | ICD-10-CM

## 2015-11-04 DIAGNOSIS — F17210 Nicotine dependence, cigarettes, uncomplicated: Secondary | ICD-10-CM | POA: Insufficient documentation

## 2015-11-04 NOTE — ED Notes (Signed)
NST post amnio for fetal lung maturity.

## 2015-11-04 NOTE — MAU Provider Note (Signed)
History     CSN: [id number]  Arrival date and time: 11/04/15 1744   First Provider Initiated Contact with Patient 11/04/15 1814      Chief Complaint  Patient presents with  . Abdominal Pain   HPI RN note: Summer Chard, RN  Nursing    [] Hide copied text [] Hover for attribution information Pt was seen at [facility] today for amnio.  M. Ardelia Mems, RN reports pt has reactive NST after procedure but pt was crying in pain at amnio puncture site.  MD wanted pt to be evaluated further in MAU.  Pt denies any vaginal bleeding, abnormal vaginal discharge or ROM.  Baby is moving well and very active.  Pt states the pain is now just a dull pain at the two sites of fluid withdrawn on mid abd.    Electronically signed by Summer Chard, RN at 11/04/2015 6:00 PM       Past Medical History:  Diagnosis Date  . Scoliosis     Past Surgical History:  Procedure Laterality Date  . KIDNEY STONE SURGERY      Family History  Problem Relation Age of Onset  . Hypertension Mother   . Depression Mother   . Hyperlipidemia Father   . Diabetes Paternal Grandmother     Social History  Substance Use Topics  . Smoking status: Light Tobacco Smoker    Types: Cigarettes  . Smokeless tobacco: Never Used     Comment: uses vape pen primarily now  . Alcohol use Yes     Comment: not since found out was pregnant at 2 mths.    Allergies:  Allergies  Allergen Reactions  . Latex Swelling    Prescriptions Prior to Admission  Medication Sig Dispense Refill Last Dose  . acetaminophen-codeine (TYLENOL #3) 300-30 MG tablet Take 1 tablet by mouth every 4 (four) hours as needed for moderate pain.   Not Taking  . penicillin v potassium (VEETID) 500 MG tablet Take 500 mg by mouth 3 (three) times daily.   Not Taking  . Prenatal Vit-Fe Fumarate-FA (PRENATAL MULTIVITAMIN) TABS tablet Take 1 tablet by mouth at bedtime.    Taking    Review of Systems  Constitutional: Negative for chills and fever.    Gastrointestinal: Positive for abdominal pain. Negative for constipation, diarrhea, nausea and vomiting.  Genitourinary: Negative for dysuria.  Neurological: Negative for headaches.   Physical Exam   Blood pressure 109/70, pulse 77, temperature 97.6 F (36.4 C), temperature source Oral, resp. rate 18, last menstrual period 02/03/2015, SpO2 100 %.  Physical Exam  Constitutional: She is oriented to person, place, and time. She appears well-developed and well-nourished. No distress.  HENT:  Head: Normocephalic.  Eyes: Pupils are equal, round, and reactive to light.  Neck: Normal range of motion. Neck supple.  Cardiovascular: Normal rate.   Respiratory: Effort normal.  GI: Soft. She exhibits no distension. There is no tenderness. There is no rebound and no guarding.  Musculoskeletal: Normal range of motion.  Neurological: She is alert and oriented to person, place, and time.  Skin: Skin is warm and dry.  Psychiatric: She has a normal mood and affect.    MAU Course  Procedures NST reactive with 6 to 25 bpm variability 15x15 accelerations; irreg mild contractions Pt is not in pain at this time and want to go home Discussed with Dr. Macon Large- pt may be discharged home Assessment and Plan  IUGR at [redacted]w[redacted]d with amnio results pending Pain after amnio- subsided F/u as scheduled  Dinara Lupu 11/04/2015, 6:33 PM

## 2015-11-04 NOTE — Patient Instructions (Signed)
Labor Precautions Reasons to come to MAU:  1.  Contractions are  5 minutes apart or less, each last 1 minute, these have been going on for 1-2 hours, and you cannot walk or talk during them 2.  You have a large gush of fluid, or a trickle of fluid that will not stop and you have to wear a pad 3.  You have bleeding that is bright red, heavier than spotting--like menstrual bleeding (spotting can be normal in early labor or after a check of your cervix) 4.  You do not feel the baby moving like he/she normally does 

## 2015-11-04 NOTE — ED Notes (Signed)
Pt crying.  Reports cramping.  Dr. Claudean SeveranceWhitecar aware.  Pt to MAU for prolonged monitoring.

## 2015-11-04 NOTE — MAU Note (Signed)
[facility].  M. Ardelia Memsobb, RN reports pt has reactive NST after procedure but pt was crying in pain at amnio puncture site.  MD wanted pt to be evaluated further in MAU.  Pt denies any vaginal bleeding, abnormal vaginal discharge or ROM.  Baby is moving well and very active.  Pt states the pain is now just a dull pain at the two sites of fluid withdrawn on mid abd.

## 2015-11-04 NOTE — Progress Notes (Signed)
   PRENATAL VISIT NOTE  Subjective:  Summer Cain is a 30 y.o. [phone] at 2292w1d being seen today for ongoing prenatal care.  She is currently monitored for the following issues for this high-risk pregnancy and has THC and opiate use complicating pregnancy, antepartum; Supervision of high risk pregnancy, antepartum; History of preterm delivery, currently pregnant in third trimester; Insufficient prenatal care in third trimester; Dental infection; and IUGR (intrauterine growth restriction) affecting care of mother, third trimester on her problem list.  Patient reports backache.  Contractions: Irregular.  .  Movement: Present. Denies leaking of fluid.   The following portions of the patient's history were reviewed and updated as appropriate: allergies, current medications, past family history, past medical history, past social history, past surgical history and problem list. Problem list updated.  Objective:   Vitals:   11/04/15 1328  BP: 124/78  Pulse: 99  Weight: 122 lb 1.6 oz (55.4 kg)    Fetal Status: Fetal Heart Rate (bpm): 156 Fundal Height: 33 cm Movement: Present  Presentation: Vertex  General:  Alert, oriented and cooperative. Patient is in no acute distress.  Skin: Skin is warm and dry. No rash noted.   Cardiovascular: Normal heart rate noted  Respiratory: Normal respiratory effort, no problems with respiration noted  Abdomen: Soft, gravid, appropriate for gestational age. Pain/Pressure: Present     Pelvic:  Cervical exam performed Dilation: 2 Effacement (%): 0 Station: -3  Extremities: Normal range of motion.     Mental Status: Normal mood and affect. Normal behavior. Normal judgment and thought content.   Assessment and Plan:  Pregnancy: G5P0131 at 5092w1d  1. IUGR (intrauterine growth restriction) affecting care of mother, third trimester --US with MFM today to evaluate.  2. Drug abuse during pregnancy  - Pain Mgmt, Profile 6 Conf w/o mM, U  Term labor symptoms and  general obstetric precautions including but not limited to vaginal bleeding, contractions, leaking of fluid and fetal movement were reviewed in detail with the patient. Please refer to After Visit Summary for other counseling recommendations.  No Follow-up on file.  Hurshel PartyLisa A Leftwich-Kirby, CNM

## 2015-11-04 NOTE — Discharge Instructions (Signed)
Amniocentesis Amniocentesis is a procedure to remove a small amount of the fluid that surrounds a baby in the uterus (amniotic fluid) so that it can be tested. Testing this fluid can provide important information about the baby. Amniocentesis is most commonly done to check whether the baby has certain genetic problems and to check how well the baby's lungs have matured. It is typically performed between 15-20 weeks of pregnancy. LET [facility] PROVIDER KNOW ABOUT:  Any complications you have had with your pregnancy, such as bleeding or contractions.  Any allergies you have.  All medicines you are taking, including vitamins, herbs, eye drops, creams, and over-the-counter medicines.  Previous problems you or members of your family have had with the use of anesthetics.  Any blood disorders you have.  Previous surgeries you have had.  Any medical conditions you may have.  Your blood type, if it is Rh negative. RISKS AND COMPLICATIONS Generally this is a safe procedure. However, problems may occur, including:  Vaginal bleeding.  Leaking of amniotic fluid.  Premature labor.  Injury to the fetus.  Injury to the placenta.  Infection.  Miscarriage (rare). BEFORE THE PROCEDURE  Drink enough fluid to keep your urine clear or pale yellow.  You may want to arrange for someone to take you home after the procedure. PROCEDURE  You may be asked to undress and put on a patient gown.  You will be asked to lie down on an exam table.  An ultrasound will be done to determine the baby's position and the best place to remove the fluid sample.  A solution to help prevent infection will be applied to your abdomen. Do not touch the area where the solution was applied until after the procedure.  You may be given a medicine to numb the area (local anesthetic). It is normal to feel some cramping while the procedure is being performed, even after getting this medicine.  A thin needle  will be inserted through the skin and into the uterus.  The needle will be used to remove a small amount (usually less than 2 tablespoons) of amniotic fluid from the amniotic sac.  The thin needle will be removed.  The part of the skin where the needle was inserted will be covered with a bandage (dressing).  The fluid that was removed will be sent for testing. The procedure may vary among health care providers and hospitals. AFTER THE PROCEDURE  You may be asked to stay after the procedure for 1 to 2 hours. During this time, you and your baby will be monitored by your health care team to make sure there are no problems before you go home.  If you are Rh negative, you may need a Rho (D) immune globulin shot. Ask your health care provider if you need to have this shot.   This information is not intended to replace advice given to you by your health care provider. Make sure you discuss any questions you have with your health care provider.   Document Released: 01/02/2000 Document Revised: 09/25/2014 Document Reviewed: 05/08/2014 Elsevier Interactive Patient Education 2016 [employer] Inc[employer]   Abdominal Pain During Pregnancy Belly (abdominal) pain is common during pregnancy. Most of the time, it is not a serious problem. Other times, it can be a sign that something is wrong with the pregnancy. Always tell your doctor if you have belly pain. HOME CARE Monitor your belly pain for any changes. The following actions may help you feel better:  Do not  have sex (intercourse) or put anything in your vagina until you feel better.  Rest until your pain stops.  Drink clear fluids if you feel sick to your stomach (nauseous). Do not eat solid food until you feel better.  Only take medicine as told by your doctor.  Keep all doctor visits as told. GET HELP RIGHT AWAY IF:   You are bleeding, leaking fluid, or pieces of tissue come out of your vagina.  You have more pain or cramping.  You keep  throwing up (vomiting).  You have pain when you pee (urinate) or have blood in your pee.  You have a fever.  You do not feel your baby moving as much.  You feel very weak or feel like passing out.  You have trouble breathing, with or without belly pain.  You have a very bad headache and belly pain.  You have fluid leaking from your vagina and belly pain.  You keep having watery poop (diarrhea).  Your belly pain does not go away after resting, or the pain gets worse. MAKE SURE YOU:   Understand these instructions.  Will watch your condition.  Will get help right away if you are not doing well or get worse.   This information is not intended to replace advice given to you by your health care provider. Make sure you discuss any questions you have with your health care provider.   Document Released: 12/23/2008 Document Revised: 09/06/2012 Document Reviewed: 08/03/2012 Elsevier Interactive Patient Education [employer]

## 2015-11-05 ENCOUNTER — Encounter (HOSPITAL_COMMUNITY): Payer: Self-pay

## 2015-11-05 ENCOUNTER — Telehealth: Payer: Self-pay | Admitting: *Deleted

## 2015-11-05 ENCOUNTER — Other Ambulatory Visit: Payer: Self-pay | Admitting: Advanced Practice Midwife

## 2015-11-05 LAB — MISC LABCORP TEST (SEND OUT): LABCORP TEST CODE: 5038

## 2015-11-05 NOTE — Telephone Encounter (Signed)
Per Dr Shawnie PonsPratt, patient is scheduled for IOL at 7am tomorrow morning. Called patient to inform her of scheduled induction. Patient stated that she cannot be there at 7 d/t having a 28 year old that she has to get to the school bus. Can she come at 8? I called Dr Shawnie PonsPratt who said that is fine. Patient informed that she can come in at 8am, Understanding voiced.

## 2015-11-06 ENCOUNTER — Inpatient Hospital Stay (HOSPITAL_COMMUNITY): Payer: Medicaid Other | Admitting: Anesthesiology

## 2015-11-06 ENCOUNTER — Inpatient Hospital Stay (HOSPITAL_COMMUNITY)
Admission: RE | Admit: 2015-11-06 | Discharge: 2015-11-08 | DRG: 775 | Disposition: A | Discharge status: Home / Self Care | Payer: Medicaid Other | Source: Ambulatory Visit | Attending: Obstetrics & Gynecology | Admitting: Obstetrics & Gynecology

## 2015-11-06 ENCOUNTER — Encounter (HOSPITAL_COMMUNITY): Payer: Self-pay

## 2015-11-06 DIAGNOSIS — O99324 Drug use complicating childbirth: Secondary | ICD-10-CM | POA: Diagnosis present

## 2015-11-06 DIAGNOSIS — Z833 Family history of diabetes mellitus: Secondary | ICD-10-CM

## 2015-11-06 DIAGNOSIS — O99334 Smoking (tobacco) complicating childbirth: Secondary | ICD-10-CM | POA: Diagnosis present

## 2015-11-06 DIAGNOSIS — F1190 Opioid use, unspecified, uncomplicated: Secondary | ICD-10-CM | POA: Diagnosis present

## 2015-11-06 DIAGNOSIS — O365990 Maternal care for other known or suspected poor fetal growth, unspecified trimester, not applicable or unspecified: Secondary | ICD-10-CM | POA: Diagnosis present

## 2015-11-06 DIAGNOSIS — O365930 Maternal care for other known or suspected poor fetal growth, third trimester, not applicable or unspecified: Principal | ICD-10-CM | POA: Diagnosis present

## 2015-11-06 DIAGNOSIS — M419 Scoliosis, unspecified: Secondary | ICD-10-CM | POA: Diagnosis present

## 2015-11-06 DIAGNOSIS — Z8249 Family history of ischemic heart disease and other diseases of the circulatory system: Secondary | ICD-10-CM | POA: Diagnosis not present

## 2015-11-06 DIAGNOSIS — F17210 Nicotine dependence, cigarettes, uncomplicated: Secondary | ICD-10-CM | POA: Diagnosis present

## 2015-11-06 DIAGNOSIS — Z3A39 39 weeks gestation of pregnancy: Secondary | ICD-10-CM

## 2015-11-06 DIAGNOSIS — F1120 Opioid dependence, uncomplicated: Secondary | ICD-10-CM

## 2015-11-06 LAB — RAPID URINE DRUG SCREEN, HOSP PERFORMED
Amphetamines: NOT DETECTED
BARBITURATES: NOT DETECTED
Benzodiazepines: NOT DETECTED
COCAINE: NOT DETECTED
Opiates: NOT DETECTED
TETRAHYDROCANNABINOL: POSITIVE — AB

## 2015-11-06 LAB — TYPE AND SCREEN
ABO/RH(D): O POS
ANTIBODY SCREEN: NEGATIVE

## 2015-11-06 LAB — CBC
HCT: 38.7 % (ref 36.0–46.0)
HEMOGLOBIN: 13.3 g/dL (ref 12.0–15.0)
MCH: 30.9 pg (ref 26.0–34.0)
MCHC: 34.4 g/dL (ref 30.0–36.0)
MCV: 90 fL (ref 78.0–100.0)
Platelets: 175 10*3/uL (ref 150–400)
RBC: 4.3 MIL/uL (ref 3.87–5.11)
RDW: 13.8 % (ref 11.5–15.5)
WBC: 8.2 10*3/uL (ref 4.0–10.5)

## 2015-11-06 MED ORDER — LACTATED RINGERS IV SOLN: 500.0000 mL | Freq: Once | INTRAVENOUS | Status: DC

## 2015-11-06 MED ORDER — LIDOCAINE HCL (PF) 1 % IJ SOLN
INTRAMUSCULAR | Status: DC | PRN
  Administered 2015-11-06: 4 mL via EPIDURAL

## 2015-11-06 MED ORDER — LACTATED RINGERS IV SOLN
INTRAVENOUS | Status: DC
  Administered 2015-11-06: 18:00:00 via INTRAVENOUS

## 2015-11-06 MED ORDER — ONDANSETRON HCL 4 MG/2ML IJ SOLN
4.0000 mg | Freq: Four times a day (QID) | INTRAMUSCULAR | Status: DC | PRN
  Administered 2015-11-06: 4 mg via INTRAVENOUS
  Filled 2015-11-06: qty 2

## 2015-11-06 MED ORDER — OXYCODONE-ACETAMINOPHEN 5-325 MG PO TABS: 1.0000 | ORAL_TABLET | ORAL | Status: DC | PRN

## 2015-11-06 MED ORDER — SOD CITRATE-CITRIC ACID 500-334 MG/5ML PO SOLN: 30.0000 mL | ORAL | Status: DC | PRN

## 2015-11-06 MED ORDER — LACTATED RINGERS IV SOLN: 500.0000 mL | INTRAVENOUS | Status: DC | PRN

## 2015-11-06 MED ORDER — DIPHENHYDRAMINE HCL 50 MG/ML IJ SOLN: 12.5000 mg | INTRAMUSCULAR | Status: DC | PRN

## 2015-11-06 MED ORDER — ACETAMINOPHEN 325 MG PO TABS: 650.0000 mg | ORAL_TABLET | ORAL | Status: DC | PRN

## 2015-11-06 MED ORDER — OXYTOCIN 40 UNITS IN LACTATED RINGERS INFUSION - SIMPLE MED
2.5000 [IU]/h | INTRAVENOUS | Status: DC
Start: 2015-11-06 — End: 2015-11-07
  Filled 2015-11-06: qty 1000

## 2015-11-06 MED ORDER — EPHEDRINE 5 MG/ML INJ
10.0000 mg | INTRAVENOUS | Status: DC | PRN
  Filled 2015-11-06: qty 4

## 2015-11-06 MED ORDER — PHENYLEPHRINE 40 MCG/ML (10ML) SYRINGE FOR IV PUSH (FOR BLOOD PRESSURE SUPPORT)
80.0000 ug | PREFILLED_SYRINGE | INTRAVENOUS | Status: DC | PRN
  Filled 2015-11-06: qty 10
  Filled 2015-11-06: qty 5

## 2015-11-06 MED ORDER — OXYTOCIN BOLUS FROM INFUSION: 500.0000 mL | Freq: Once | INTRAVENOUS | Status: DC

## 2015-11-06 MED ORDER — LIDOCAINE HCL (PF) 1 % IJ SOLN
30.0000 mL | INTRAMUSCULAR | Status: DC | PRN
  Filled 2015-11-06: qty 30

## 2015-11-06 MED ORDER — FENTANYL 2.5 MCG/ML BUPIVACAINE 1/10 % EPIDURAL INFUSION (WH - ANES)
14.0000 mL/h | INTRAMUSCULAR | Status: DC | PRN
  Administered 2015-11-06: 14 mL/h via EPIDURAL
  Filled 2015-11-06: qty 125

## 2015-11-06 MED ORDER — TERBUTALINE SULFATE 1 MG/ML IJ SOLN
0.2500 mg | Freq: Once | INTRAMUSCULAR | Status: DC | PRN
  Filled 2015-11-06: qty 1

## 2015-11-06 MED ORDER — PHENYLEPHRINE 40 MCG/ML (10ML) SYRINGE FOR IV PUSH (FOR BLOOD PRESSURE SUPPORT): 80.0000 ug | PREFILLED_SYRINGE | INTRAVENOUS | Status: DC | PRN

## 2015-11-06 MED ORDER — PHENYLEPHRINE 40 MCG/ML (10ML) SYRINGE FOR IV PUSH (FOR BLOOD PRESSURE SUPPORT)
80.0000 ug | PREFILLED_SYRINGE | INTRAVENOUS | Status: DC | PRN
  Filled 2015-11-06: qty 5

## 2015-11-06 MED ORDER — MISOPROSTOL 25 MCG QUARTER TABLET
25.0000 ug | ORAL_TABLET | ORAL | Status: DC | PRN
  Administered 2015-11-06 (×2): 25 ug via VAGINAL
  Filled 2015-11-06: qty 1
  Filled 2015-11-06 (×2): qty 0.25

## 2015-11-06 MED ORDER — FENTANYL CITRATE (PF) 100 MCG/2ML IJ SOLN: 100.0000 ug | INTRAMUSCULAR | Status: DC | PRN

## 2015-11-06 MED ORDER — OXYTOCIN 40 UNITS IN LACTATED RINGERS INFUSION - SIMPLE MED
1.0000 m[IU]/min | INTRAVENOUS | Status: DC
  Administered 2015-11-06: 2 m[IU]/min via INTRAVENOUS

## 2015-11-06 MED ORDER — OXYCODONE-ACETAMINOPHEN 5-325 MG PO TABS: 2.0000 | ORAL_TABLET | ORAL | Status: DC | PRN

## 2015-11-06 MED ORDER — EPHEDRINE 5 MG/ML INJ: 10.0000 mg | INTRAVENOUS | Status: DC | PRN

## 2015-11-06 NOTE — Anesthesia Pain Management Evaluation Note (Signed)
  CRNA Pain Management Visit Note  Patient: Summer Cain, 30 y.o., female  "Hello I am a member of the anesthesia team at [facility]. We have an anesthesia team available at all times to provide care throughout the hospital, including epidural management and anesthesia for C-section. I don't know your plan for the delivery whether it a natural birth, water birth, IV sedation, nitrous supplementation, doula or epidural, but we want to meet your pain goals."   1.Was your pain managed to your expectations on prior hospitalizations?   Yes   2.What is your expectation for pain management during this hospitalization?     Epidural and IV pain meds  3.How can we help you reach that goal? IV medication and epidural, nursing comfort measures  Record the patient's initial score and the patient's pain goal.   Pain: 3  Pain Goal: 4 [facility] wants you to be able to say your pain was always managed very well.  [facility],[name] 11/06/2015

## 2015-11-06 NOTE — Progress Notes (Signed)
Delivery under my direct supervision.  CRESENZO-DISHMAN,Kerry-Anne Mezo

## 2015-11-06 NOTE — Anesthesia Preprocedure Evaluation (Signed)
Anesthesia Evaluation  Patient identified by MRN, date of birth, ID band Patient awake    Reviewed: Allergy & Precautions, NPO status , Patient's Chart, lab work & pertinent test results  Airway Mallampati: I  TM Distance: >3 FB Neck ROM: Full    Dental  (+) Teeth Intact, Dental Advisory Given   Pulmonary Current Smoker,    breath sounds clear to auscultation       Cardiovascular negative cardio ROS   Rhythm:Regular Rate:Normal     Neuro/Psych negative neurological ROS  negative psych ROS   GI/Hepatic negative GI ROS, Neg liver ROS,   Endo/Other  negative endocrine ROS  Renal/GU negative Renal ROS  negative genitourinary   Musculoskeletal negative musculoskeletal ROS (+)   Abdominal   Peds negative pediatric ROS (+)  Hematology negative hematology ROS (+)   Anesthesia Other Findings Illicit Drug Use  Reproductive/Obstetrics negative OB ROS                             Lab Results  Component Value Date   WBC 8.2 11/06/2015   HGB 13.3 11/06/2015   HCT 38.7 11/06/2015   MCV 90.0 11/06/2015   PLT 175 11/06/2015   No results found for: INR, PROTIME   Anesthesia Physical Anesthesia Plan  ASA: II  Anesthesia Plan: Epidural   Post-op Pain Management:    Induction:   Airway Management Planned:   Additional Equipment:   Intra-op Plan:   Post-operative Plan:   Informed Consent: I have reviewed the patients History and Physical, chart, labs and discussed the procedure including the risks, benefits and alternatives for the proposed anesthesia with the patient or authorized representative who has indicated his/her understanding and acceptance.     Plan Discussed with:   Anesthesia Plan Comments:         Anesthesia Quick Evaluation

## 2015-11-06 NOTE — H&P (Signed)
LABOR AND DELIVERY ADMISSION HISTORY AND PHYSICAL NOTE  Summer Cain is a 30 y.o. female [id number] with IUP at 5571w3d by LMP presenting for IOL 2/2 IUGR.   She reports positive fetal movement. She denies leakage of fluid or vaginal bleeding. States had a previous preterm infant about [redacted] weeks EGA, induced for IUGR. Admits to smoking "vapes".   Prenatal History/Complications:  Late prenatal care at 34 weeks; h/o preterm birth 2/2 IUGR  Past Medical History: Past Medical History:  Diagnosis Date  . Scoliosis     Past Surgical History: Past Surgical History:  Procedure Laterality Date  . KIDNEY STONE SURGERY      Obstetrical History: OB History    Gravida Para Term Preterm AB Living   5 1 0 1 3 1    SAB TAB Ectopic Multiple Live Births   2 1 0 0 1      Social History: Social History   Social History  . Marital status: Single    Spouse name: N/A  . Number of children: N/A  . Years of education: N/A   Social History Main Topics  . Smoking status: Light Tobacco Smoker    Types: Cigarettes  . Smokeless tobacco: Never Used     Comment: uses vape pen primarily now  . Alcohol use Yes     Comment: not since found out was pregnant at 2 mths.  . Drug use:     Types: Marijuana     Comment: last marijuana use was 2016  . Sexual activity: Yes    Birth control/ protection: None   Other Topics Concern  . None   Social History Narrative  . None    Family History: Family History  Problem Relation Age of Onset  . Hypertension Mother   . Depression Mother   . Hyperlipidemia Father   . Diabetes Paternal Grandmother     Allergies: Allergies  Allergen Reactions  . Latex Swelling    Prescriptions Prior to Admission  Medication Sig Dispense Refill Last Dose  . acetaminophen-codeine (TYLENOL #3) 300-30 MG tablet Take 1 tablet by mouth every 4 (four) hours as needed for moderate pain.   Not Taking  . penicillin v potassium (VEETID) 500 MG tablet Take 500 mg by mouth 3  (three) times daily.   Not Taking  . Prenatal Vit-Fe Fumarate-FA (PRENATAL MULTIVITAMIN) TABS tablet Take 1 tablet by mouth at bedtime.    Taking     Review of Systems   All systems reviewed and negative except as stated in HPI  Blood pressure 108/67, pulse 81, temperature 97.9 F (36.6 C), height 5\' 1"  (1.549 m), weight 122 lb (55.3 kg), last menstrual period 02/03/2015. General appearance: alert, cooperative, appears stated age and no distress Lungs: clear to auscultation bilaterally Heart: regular rate and rhythm Abdomen: soft, non-tender; bowel sounds normal Extremities: No calf swelling or tenderness Presentation: cephalic Fetal monitoring: 140, mod var +accels, no decels Uterine activity: Infrequent q3-5 min     Prenatal labs: ABO, Rh: --/--/O POS (10/19 1040) Antibody: PENDING (10/19 1040) Rubella: !Error! RPR: Non Reactive (09/08 2036)  HBsAg: Negative (09/08 2036)  HIV: Non Reactive (09/08 2036)  GBS: Negative (10/03 0000)  1 hr Glucola: 102 Genetic screening:  Declined (too late) Anatomy US: Limited 2/2 advanced gestational age, but normal, female  Prenatal Transfer Tool  Maternal Diabetes: No Genetic Screening: Declined Maternal Ultrasounds/Referrals: Normal Fetal Ultrasounds or other Referrals:  None Maternal Substance Abuse:  Yes:  Type: Smoker, Marijuana, Prescription drugs Significant Maternal  Medications:  None Significant Maternal Lab Results: None  Results for orders placed or performed during the hospital encounter of 11/06/15 (from the past 24 hour(s))  CBC   Collection Time: 11/06/15 10:40 AM  Result Value Ref Range   WBC 8.2 4.0 - 10.5 K/uL   RBC 4.30 3.87 - 5.11 MIL/uL   Hemoglobin 13.3 12.0 - 15.0 g/dL   HCT 40.9 81.1 - 91.4 %   MCV 90.0 78.0 - 100.0 fL   MCH 30.9 26.0 - 34.0 pg   MCHC 34.4 30.0 - 36.0 g/dL   RDW 78.2 95.6 - 21.3 %   Platelets 175 150 - 400 K/uL  Type and screen   Collection Time: 11/06/15 10:40 AM  Result Value Ref  Range   ABO/RH(D) O POS    Antibody Screen PENDING    Sample Expiration 11/09/2015     Patient Active Problem List   Diagnosis Date Noted  . IUGR (intrauterine growth restriction) affecting care of mother 11/06/2015  . IUGR (intrauterine growth restriction) affecting care of mother, third trimester 10/29/2015  . Dental infection 10/21/2015  . Insufficient prenatal care in third trimester 10/08/2015  . Supervision of high risk pregnancy, antepartum 10/01/2015  . History of preterm delivery, currently pregnant in third trimester 10/01/2015  . THC and opiate use complicating pregnancy, antepartum 09/26/2015    Assessment: Summer Cain is a 30 y.o. [id number] at [redacted]w[redacted]d here for IOL 2/2 IUGR.  #Labor: IOL w/ FB and PV cytotec #Pain: Epidural on request, IV pain meds prn before #FWB: Cat I #ID:  GBS Neg #MOF: Breast #MOC: Nexplanon #Circ:   Yes (outpatient)  Jen Mow, DO 11/06/2015, 11:45 AM

## 2015-11-06 NOTE — Anesthesia Procedure Notes (Signed)
Epidural Patient location during procedure: OB Start time: 11/06/2015 7:05 PM End time: 11/06/2015 7:13 PM  Staffing Anesthesiologist: Shona SimpsonHOLLIS, Summer Corriher D Performed: anesthesiologist   Preanesthetic Checklist Completed: patient identified, site marked, surgical consent, pre-op evaluation, timeout performed, IV checked, risks and benefits discussed and monitors and equipment checked  Epidural Patient position: sitting Prep: ChloraPrep Patient monitoring: heart rate, continuous pulse ox and blood pressure Approach: midline Location: L3-L4 Injection technique: LOR saline  Needle:  Needle type: Tuohy  Needle gauge: 17 G Needle length: 9 cm Catheter type: closed end flexible Catheter size: 20 Guage Test dose: negative and 1.5% lidocaine  Assessment Events: blood not aspirated, injection not painful, no injection resistance and no paresthesia  Additional Notes LOR @ 5  Patient identified. Risks/Benefits/Options discussed with patient including but not limited to bleeding, infection, nerve damage, paralysis, failed block, incomplete pain control, headache, blood pressure changes, nausea, vomiting, reactions to medications, itching and postpartum back pain. Confirmed with bedside nurse the patient's most recent platelet count. Confirmed with patient that they are not currently taking any anticoagulation, have any bleeding history or any family history of bleeding disorders. Patient expressed understanding and wished to proceed. All questions were answered. Sterile technique was used throughout the entire procedure. Please see nursing notes for vital signs. Test dose was given through epidural catheter and negative prior to continuing to dose epidural or start infusion. Warning signs of high block given to the patient including shortness of breath, tingling/numbness in hands, complete motor block, or any concerning symptoms with instructions to call for help. Patient was given instructions on  fall risk and not to get out of bed. All questions and concerns addressed with instructions to call with any issues or inadequate analgesia.    Reason for block:procedure for pain

## 2015-11-07 ENCOUNTER — Encounter (HOSPITAL_COMMUNITY): Payer: Self-pay

## 2015-11-07 LAB — RPR: RPR: NONREACTIVE

## 2015-11-07 MED ORDER — OXYTOCIN 40 UNITS IN LACTATED RINGERS INFUSION - SIMPLE MED: 2.5000 [IU]/h | INTRAVENOUS | Status: DC | PRN

## 2015-11-07 MED ORDER — OXYCODONE HCL 5 MG PO TABS
10.0000 mg | ORAL_TABLET | ORAL | Status: DC | PRN
Start: 2015-11-07 — End: 2015-11-08

## 2015-11-07 MED ORDER — ONDANSETRON HCL 4 MG/2ML IJ SOLN: 4.0000 mg | INTRAMUSCULAR | Status: DC | PRN

## 2015-11-07 MED ORDER — TETANUS-DIPHTH-ACELL PERTUSSIS 5-2.5-18.5 LF-MCG/0.5 IM SUSP: 0.5000 mL | Freq: Once | INTRAMUSCULAR | Status: DC

## 2015-11-07 MED ORDER — PRENATAL MULTIVITAMIN CH
1.0000 | ORAL_TABLET | Freq: Every day | ORAL | Status: DC
  Administered 2015-11-07: 1 via ORAL
  Filled 2015-11-07: qty 1

## 2015-11-07 MED ORDER — DIPHENHYDRAMINE HCL 25 MG PO CAPS: 25.0000 mg | ORAL_CAPSULE | Freq: Four times a day (QID) | ORAL | Status: DC | PRN

## 2015-11-07 MED ORDER — ONDANSETRON HCL 4 MG PO TABS: 4.0000 mg | ORAL_TABLET | ORAL | Status: DC | PRN

## 2015-11-07 MED ORDER — OXYCODONE HCL 5 MG PO TABS: 5.0000 mg | ORAL_TABLET | ORAL | Status: DC | PRN

## 2015-11-07 MED ORDER — METHYLERGONOVINE MALEATE 0.2 MG/ML IJ SOLN: 0.2000 mg | INTRAMUSCULAR | Status: DC | PRN

## 2015-11-07 MED ORDER — BENZOCAINE-MENTHOL 20-0.5 % EX AERO: 1.0000 "application " | INHALATION_SPRAY | CUTANEOUS | Status: DC | PRN

## 2015-11-07 MED ORDER — ACETAMINOPHEN 325 MG PO TABS
650.0000 mg | ORAL_TABLET | ORAL | Status: DC | PRN
  Administered 2015-11-07: 650 mg via ORAL
  Filled 2015-11-07: qty 2

## 2015-11-07 MED ORDER — DIBUCAINE 1 % RE OINT: 1.0000 "application " | TOPICAL_OINTMENT | RECTAL | Status: DC | PRN

## 2015-11-07 MED ORDER — SIMETHICONE 80 MG PO CHEW: 80.0000 mg | CHEWABLE_TABLET | ORAL | Status: DC | PRN

## 2015-11-07 MED ORDER — COCONUT OIL OIL: 1.0000 "application " | TOPICAL_OIL | Status: DC | PRN

## 2015-11-07 MED ORDER — MEASLES, MUMPS & RUBELLA VAC ~~LOC~~ INJ
0.5000 mL | INJECTION | Freq: Once | SUBCUTANEOUS | Status: DC
  Filled 2015-11-07: qty 0.5

## 2015-11-07 MED ORDER — SENNOSIDES-DOCUSATE SODIUM 8.6-50 MG PO TABS
2.0000 | ORAL_TABLET | ORAL | Status: DC
  Filled 2015-11-07 (×2): qty 2

## 2015-11-07 MED ORDER — METHYLERGONOVINE MALEATE 0.2 MG PO TABS: 0.2000 mg | ORAL_TABLET | ORAL | Status: DC | PRN

## 2015-11-07 MED ORDER — ZOLPIDEM TARTRATE 5 MG PO TABS: 5.0000 mg | ORAL_TABLET | Freq: Every evening | ORAL | Status: DC | PRN

## 2015-11-07 MED ORDER — IBUPROFEN 600 MG PO TABS
600.0000 mg | ORAL_TABLET | Freq: Four times a day (QID) | ORAL | Status: DC
  Administered 2015-11-07 – 2015-11-08 (×4): 600 mg via ORAL
  Filled 2015-11-07 (×6): qty 1

## 2015-11-07 MED ORDER — WITCH HAZEL-GLYCERIN EX PADS: 1.0000 "application " | MEDICATED_PAD | CUTANEOUS | Status: DC | PRN

## 2015-11-07 NOTE — Plan of Care (Signed)
Problem: Urinary Elimination: Goal: Ability to reestablish a normal urinary elimination pattern will improve Outcome: Completed/Met Date Met: 11/07/15 Pt voiding independently and without difficulty.
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
## 2015-11-07 NOTE — Lactation Note (Signed)
This note was copied from a baby's chart. Lactation Consultation Note  Patient Name: Summer Cain [id number]'s Date: 11/07/2015 Reason for consult: Initial assessment;Infant < 6lbs  Visited with 2nd time Mom, baby 7116 hrs old.  Baby term (512w3d) weighing 5 lbs 4.1 oz.  Mom has BF baby 6 times, with 2 attempts so far.  Output today 3 stools, and 3 voids.  Baby appear vigorous and alert, with bruised face from face presentation at delivery (per Mom).  Assisted Mom with latching baby in cradle hold (resisted cross cradle).  Mom has small breasts, and erect nipples.  Baby able to latch deeply onto areola while in cradle.  Reviewed basics, and showed how to use breast compression to increase milk transfer during feeding.  Encouraged skin to skin, and feeding baby every 3 hrs or sooner if he cues to feed.  Encouraged Mom to call for assistance as needed.  Mentioned the possibility that baby may require supplementation due to being small at birth.  We will continue to monitor baby and watch for sleepiness at the breast, and output, and baby's weight each night.   Brochure left with Mom.  Informed her of IP and OP lactation support available to her.  Lactation to f/u in am.  Maternal Data Formula Feeding for Exclusion: No Has patient been taught Hand Expression?: Yes Does the patient have breastfeeding experience prior to this delivery?: Yes  Feeding Feeding Type: Breast Fed Length of feed: 15 min  LATCH Score/Interventions Latch: Grasps breast easily, tongue down, lips flanged, rhythmical sucking. Intervention(s): Waking techniques;Teach feeding cues;Skin to skin Intervention(s): Adjust position;Assist with latch;Breast massage;Breast compression  Audible Swallowing: A few with stimulation Intervention(s): Skin to skin;Hand expression Intervention(s): Skin to skin;Hand expression;Alternate breast massage  Type of Nipple: Everted at rest and after stimulation  Comfort (Breast/Nipple): Soft /  non-tender     Hold (Positioning): Assistance needed to correctly position infant at breast and maintain latch. Intervention(s): Breastfeeding basics reviewed;Support Pillows;Position options;Skin to skin  LATCH Score: 8  Consult Status Consult Status: Follow-up Date: 11/08/15 Follow-up type: In-patient    Summer Cain, Summer Cain 11/07/2015, 3:24 PM

## 2015-11-07 NOTE — Progress Notes (Signed)
Patient ID: Summer Cain, female   DOB: March 12, 1985, 30 y.o.   MRN: [id number]  POSTPARTUM PROGRESS NOTE  Post Partum Day #1 -   Subjective:  Summer DoorHeather Einstein is a 30 y.o. [id number] 1025w3d s/p NSVD.  No acute events overnight.  Pt denies problems with ambulating, voiding or po intake.  She denies nausea or vomiting.  Pain is well controlled.  She has had flatus. She has not had bowel movement.  Lochia Small.   Objective: Blood pressure 118/62, pulse (!) 54, temperature 97.5 F (36.4 C), temperature source Oral, resp. rate 18, height 5\' 1"  (1.549 m), weight 122 lb (55.3 kg), last menstrual period 02/03/2015, unknown if currently breastfeeding.  Physical Exam:  General: alert, cooperative and no distress Lochia:normal flow Chest: CTAB Heart: RRR no m/r/g Abdomen: +BS, soft, nontender,  Uterine Fundus: firm, below umbilicus DVT Evaluation: No calf swelling or tenderness Extremities: No edema   Recent Labs  11/06/15 1040  HGB 13.3  HCT 38.7    Assessment/Plan:  ASSESSMENT: Summer DoorHeather Sollers is a 30 y.o. [id number] 5225w3d s/p NSVD  Plan for discharge tomorrow, Breastfeeding, Lactation consult and Contraception Nexplanon. Circumcision outpatient.   LOS: 1 day   Jen MowElizabeth Medha Pippen, DO 11/07/2015

## 2015-11-07 NOTE — Anesthesia Postprocedure Evaluation (Signed)
Anesthesia Post Note  Patient: Geophysical data processorHeather Natter  Procedure(s) Performed: * No procedures listed *  Patient location during evaluation: Mother Baby Anesthesia Type: Epidural Level of consciousness: awake and alert, oriented and patient cooperative Pain management: pain level controlled Vital Signs Assessment: post-procedure vital signs reviewed and stable Respiratory status: spontaneous breathing Cardiovascular status: stable Postop Assessment: no headache, epidural receding, patient able to bend at knees and no signs of nausea or vomiting Anesthetic complications: no Comments: Pain score 1.     Last Vitals:  Vitals:   11/07/15 0200 11/07/15 0600  BP: (!) 97/54 (!) 96/51  Pulse: 70 69  Resp: 18 18  Temp: 36.6 C 36.7 C    Last Pain:  Vitals:   11/07/15 0600  TempSrc: Oral  PainSc:    Pain Goal:                 [facility] Scott[facility]aleWRINKLE,Karri Kallenbach

## 2015-11-08 ENCOUNTER — Ambulatory Visit: Payer: Self-pay

## 2015-11-08 MED ORDER — ACETAMINOPHEN 325 MG PO TABS: 650.0000 mg | ORAL_TABLET | ORAL | 0 refills | Status: DC | PRN

## 2015-11-08 MED ORDER — SENNOSIDES-DOCUSATE SODIUM 8.6-50 MG PO TABS: 2.0000 | ORAL_TABLET | ORAL | 0 refills | Status: DC

## 2015-11-08 MED ORDER — IBUPROFEN 600 MG PO TABS: 600.0000 mg | ORAL_TABLET | Freq: Four times a day (QID) | ORAL | 0 refills | Status: DC

## 2015-11-08 NOTE — Progress Notes (Signed)
At approximately 2130pm shouting noted at nurses station from room 106 "get out!" heard by Clinical research associatewriter and Jarvis MorganW. Semple RN (verbal confirmation confirmed).  In to see pt. Pt stated, "The baby hurt me when he latched."  Assisted MOB with breastfeeding.

## 2015-11-08 NOTE — Discharge Instructions (Signed)

## 2015-11-08 NOTE — Lactation Note (Signed)
This note was copied from a baby's chart. Lactation Consultation Note  Baby is being supplemented with formula related to increasing NAS scores, respirations and weight loss.  Mom is unsure why baby has lost so much weight because he has been "eating". Observed him BF and with breast compressions many swallows were heard. Suspect mom was not using them before and baby was not transferring well. Plan is to feed on cue. Follow-up with supplement and post pump to aid in bringing milk to volume. Explained to mother that we had consider birth weight, weight loss, facial bruising and potential for jaundice and NAS. She was understanding of this and agreed to plan.  Follow-up tomorrow.  Patient Name: Summer Cain [id number] Date: 11/08/2015     Maternal Data    Feeding Feeding Type: Bottle Fed - Formula Nipple Type: Slow - flow  LATCH Score/Interventions                      Lactation Tools Discussed/Used     Consult Status Consult Status: Follow-up Date: 11/09/15 Follow-up type: In-patient    Soyla Dryer 11/08/2015, 3:36 PM

## 2015-11-08 NOTE — Discharge Summary (Signed)
OB Discharge Summary     Patient Name: Summer Cain DOB: 1985/06/29 MRN: [id number]  Date of admission: 11/06/2015 Delivering MD: Clayton Bibles C   Date of discharge: 11/08/2015  Admitting diagnosis: INDUCTION Intrauterine pregnancy: [redacted]w[redacted]d     Secondary diagnosis:  Active Problems:   IUGR (intrauterine growth restriction) affecting care of mother  Additional problems: none     Discharge diagnosis: Term Pregnancy Delivered                                                                                                Post partum procedures:none  Augmentation: AROM, Pitocin, Cytotec and Foley Balloon  Complications: None  Hospital course:  Induction of Labor With Vaginal Delivery   30 y.o. yo [id number] at [redacted]w[redacted]d was admitted to the hospital 11/06/2015 for induction of labor.  Indication for induction: IUGR.  Patient had an uncomplicated labor course as follows: Membrane Rupture Time/Date: 6:41 PM ,11/06/2015   Intrapartum Procedures: Episiotomy: None [1]                                         Lacerations:  None [1]  Patient had delivery of a Viable infant.  Information for the patient's newborn:  Foxworthy, Sparkle Aube [[id number]]  Delivery Method: Vaginal, Spontaneous Delivery (Filed from Delivery Summary)   11/06/2015  Details of delivery can be found in separate delivery note.  Patient had a routine postpartum course. Patient is discharged home 11/08/15.   Physical exam Vitals:   11/07/15 0921 11/07/15 1807 11/07/15 2000 11/08/15 0648  BP: 109/65 118/62  98/61  Pulse: (!) 57 (!) 54  (!) 51  Resp: 18 18  18   Temp: 97.5 F (36.4 C)  97.9 F (36.6 C) 98 F (36.7 C)  TempSrc: Oral  Oral Oral  Weight:      Height:       General: alert, cooperative and no distress Lochia: appropriate Uterine Fundus: firm Incision: N/A DVT Evaluation: No evidence of DVT seen on physical exam. No significant calf/ankle edema. Labs: Lab Results  Component Value Date   WBC  8.2 11/06/2015   HGB 13.3 11/06/2015   HCT 38.7 11/06/2015   MCV 90.0 11/06/2015   PLT 175 11/06/2015   No flowsheet data found.  Discharge instruction: per After Visit Summary and "Baby and Me Booklet".  After visit meds:    Medication List    STOP taking these medications   penicillin v potassium 500 MG tablet Commonly known as:  VEETID     TAKE these medications   acetaminophen 325 MG tablet Commonly known as:  TYLENOL Take 2 tablets (650 mg total) by mouth every 4 (four) hours as needed (for pain scale < 4).   ibuprofen 600 MG tablet Commonly known as:  ADVIL,MOTRIN Take 1 tablet (600 mg total) by mouth every 6 (six) hours.   prenatal multivitamin Tabs tablet Take 1 tablet by mouth at bedtime.   senna-docusate 8.6-50 MG tablet Commonly known as:  Senokot-S Take 2 tablets  by mouth daily. Start taking on:  11/09/2015       Diet: routine diet  Activity: Advance as tolerated. Pelvic rest for 6 weeks.   Outpatient follow up:6 weeks Follow up Appt:No future appointments. Follow up Visit:No Follow-up on file.  Postpartum contraception: Nexplanon  Newborn Data: Live born female  Birth Weight: 5 lb 4.1 oz (2384 g) APGAR: 8, 9  Baby Feeding: Breast Disposition:home with mother   11/08/2015 Ernestina PennaNicholas Zafir Schauer, MD
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
## 2015-11-09 ENCOUNTER — Ambulatory Visit: Payer: Self-pay

## 2015-11-09 NOTE — Lactation Note (Signed)
This note was copied from a baby's chart. Lactation Consultation Note  Patient Name: Summer Cain NFAOZ'HToday's Date: 11/09/2015 Reason for consult: Follow-up assessment Baby at 71 hr of life. Mom is reporting bilateral sore nipples. The R breast as 2 dark brown scabs and the L breast has 1 dark brown scab. Given comfort gels. Reviewed nipple care and manual expression. Mom stated that because of the nipple soreness she has not been latching, she has been pumping to feed. Encouraged her to pump g3hr around the clock and she was agreeable. She will call for latch help as needed. She is aware of lactation services and support group. She will call as needed.    Maternal Data    Feeding Feeding Type: Breast Milk Nipple Type: Slow - flow Length of feed: 15 min  LATCH Score/Interventions       Type of Nipple: Everted at rest and after stimulation  Comfort (Breast/Nipple): Filling, red/small blisters or bruises, mild/mod discomfort  Problem noted: Mild/Moderate discomfort;Cracked, bleeding, blisters, bruises Interventions  (Cracked/bleeding/bruising/blister): Expressed breast milk to nipple Interventions (Mild/moderate discomfort): Comfort gels        Lactation Tools Discussed/Used     Consult Status Consult Status: Follow-up Date: 11/10/15 Follow-up type: In-patient    Summer Cain 11/09/2015, 10:02 PM

## 2015-11-10 ENCOUNTER — Ambulatory Visit: Payer: Self-pay

## 2015-11-10 NOTE — Lactation Note (Signed)
This note was copied from a baby's chart. Lactation Consultation Note  Patient Name: Boy Herbert SetaHeather Cecere Today's Date: 11/10/2015  Follow up visit made prior to discharge.  Breasts are slightly engorged but leaking.  Baby crying and showing feeding cues.  Observed mom latch baby well to breast.  Instructed on good breast massage and compression during feeding.  Ice packs given to mom to use in between feedings.  Instructed mom on keeping a feeding diary and use of manual pump.  Mom denies questions.  Lactation outpatient services and support reviewed and encouraged.   Maternal Data    Feeding Feeding Type: Breast Milk Nipple Type: Slow - flow  LATCH Score/Interventions                      Lactation Tools Discussed/Used     Consult Status      Huston FoleyMOULDEN, Wiletta Bermingham S 11/10/2015, 9:36 AM

## 2015-11-11 ENCOUNTER — Encounter: Payer: Self-pay | Admitting: Family

## 2015-11-17 ENCOUNTER — Encounter: Payer: Self-pay | Admitting: Medical

## 2015-12-22 ENCOUNTER — Ambulatory Visit: Payer: Self-pay | Admitting: Medical

## 2016-01-02 ENCOUNTER — Ambulatory Visit (HOSPITAL_COMMUNITY)
Admission: EM | Admit: 2016-01-02 | Discharge: 2016-01-02 | Disposition: A | Discharge status: Home / Self Care | Payer: Medicaid Other | Attending: Internal Medicine | Admitting: Internal Medicine

## 2016-01-02 ENCOUNTER — Encounter (HOSPITAL_COMMUNITY): Payer: Self-pay | Admitting: Emergency Medicine

## 2016-01-02 DIAGNOSIS — L02412 Cutaneous abscess of left axilla: Secondary | ICD-10-CM | POA: Diagnosis not present

## 2016-01-02 MED ORDER — IBUPROFEN 800 MG PO TABS: 800.0000 mg | ORAL_TABLET | Freq: Three times a day (TID) | ORAL | 0 refills | Status: AC

## 2016-01-02 MED ORDER — LIDOCAINE-EPINEPHRINE (PF) 2 %-1:200000 IJ SOLN
INTRAMUSCULAR | Status: AC
  Filled 2016-01-02: qty 20

## 2016-01-02 MED ORDER — CEPHALEXIN 500 MG PO CAPS: 500.0000 mg | ORAL_CAPSULE | Freq: Two times a day (BID) | ORAL | 0 refills | Status: DC

## 2016-01-02 MED ORDER — SULFAMETHOXAZOLE-TRIMETHOPRIM 800-160 MG PO TABS: 1.0000 | ORAL_TABLET | Freq: Two times a day (BID) | ORAL | 0 refills | Status: AC

## 2016-01-02 NOTE — Discharge Instructions (Addendum)
Partially drained abscess left underarm.  Wash wound with soap/water 1-2 times daily and apply antibiotic ointment/bandage.  Recheck for increasing redness/swelling/drainage/pain or new fever >100.5, or if not starting to improve in a few days.

## 2016-01-02 NOTE — ED Provider Notes (Signed)
[facility]    CSN: [id number] Arrival date & time: 01/02/16  1846     History   Chief Complaint Chief Complaint  Patient presents with  . Abscess    HPI Summer Cain is a 30 y.o. female. She presents today with a one-week history of increasingly painful abscess in the left axilla. She has attempted to drain this a couple of times with a pin at home, with minimal relief. No fever, no malaise. She has had some boils in the past but not persistent like this.    HPI  Past Medical History:  Diagnosis Date  . Scoliosis     Patient Active Problem List   Diagnosis Date Noted  . IUGR (intrauterine growth restriction) affecting care of mother 11/06/2015  . IUGR (intrauterine growth restriction) affecting care of mother, third trimester 10/29/2015  . Dental infection 10/21/2015  . Insufficient prenatal care in third trimester 10/08/2015  . Supervision of high risk pregnancy, antepartum 10/01/2015  . History of preterm delivery, currently pregnant in third trimester 10/01/2015  . THC and opiate use complicating pregnancy, antepartum 09/26/2015    Past Surgical History:  Procedure Laterality Date  . KIDNEY STONE SURGERY      OB History    Gravida Para Term Preterm AB Living   5 2 1 1 3 2    SAB TAB Ectopic Multiple Live Births   2 1 0 0 2       Home Medications    Prior to Admission medications   Medication Sig Start Date End Date Taking? Authorizing Provider  Prenatal Vit-Fe Fumarate-FA (PRENATAL MULTIVITAMIN) TABS tablet Take 1 tablet by mouth at bedtime.    Yes Historical Provider, MD  acetaminophen (TYLENOL) 325 MG tablet Take 2 tablets (650 mg total) by mouth every 4 (four) hours as needed (for pain scale < 4). 11/08/15   Lorne SkeensNicholas Michael Schenk, MD  cephALEXin (KEFLEX) 500 MG capsule Take 1 capsule (500 mg total) by mouth 2 (two) times daily. 01/02/16   Eustace MooreLaura W Lamoine Magallon, MD  ibuprofen (ADVIL,MOTRIN) 800 MG tablet Take 1 tablet (800 mg total) by mouth  3 (three) times daily. 01/02/16 01/12/16  Eustace MooreLaura W Shanece Cochrane, MD  senna-docusate (SENOKOT-S) 8.6-50 MG tablet Take 2 tablets by mouth daily. 11/09/15   Lorne SkeensNicholas Michael Schenk, MD  sulfamethoxazole-trimethoprim (BACTRIM DS,SEPTRA DS) 800-160 MG tablet Take 1 tablet by mouth 2 (two) times daily. 01/02/16 01/12/16  Eustace MooreLaura W Janila Arrazola, MD    Family History Family History  Problem Relation Age of Onset  . Hypertension Mother   . Depression Mother   . Hyperlipidemia Father   . Diabetes Paternal Grandmother     Social History Social History  Substance Use Topics  . Smoking status: Light Tobacco Smoker    Types: Cigarettes  . Smokeless tobacco: Never Used     Comment: uses vape pen primarily now  . Alcohol use Yes     Comment: not since found out was pregnant at 2 mths.     Allergies   Latex   Review of Systems Review of Systems  All other systems reviewed and are negative.    Physical Exam Triage Vital Signs ED Triage Vitals  Enc Vitals Group     BP 01/02/16 1908 107/71     Pulse Rate 01/02/16 1908 79     Resp 01/02/16 1908 20     Temp 01/02/16 1908 98.4 F (36.9 C)     Temp Source 01/02/16 1908 Oral     SpO2 01/02/16  1908 96 %     Weight --      Height --      Pain Score 01/02/16 1907 7   Updated Vital Signs BP 107/71 (BP Location: Left Arm)   Pulse 79   Temp 98.4 F (36.9 C) (Oral)   Resp 20   SpO2 96%   Breastfeeding? No  Physical Exam  Constitutional: She is oriented to person, place, and time. No distress.  Alert, nicely groomed  HENT:  Head: Atraumatic.  Eyes:  Conjugate gaze, no eye redness/drainage  Neck: Neck supple.  Cardiovascular: Normal rate.   Pulmonary/Chest: No respiratory distress.  Abdominal: She exhibits no distension.  Musculoskeletal: Normal range of motion.  No leg swelling  Neurological: She is alert and oriented to person, place, and time.  Skin: Skin is warm and dry.  No cyanosis 2.5 inch deep red tense fluctuant swelling in the  left axilla, pointing.  Nursing note and vitals reviewed.    UC Treatments / Results   Procedures Procedures (including critical care time) Surface of the abscess prepped with Hibiclens and saline; infiltrated with 2% lidocaine with epi. A stab incision was made with a #11 blade, with release of a moderate amount of pus, and reduction in size of the abscess by about 50%. Patient did not tolerate further exploration or expression of contents of the abscess, and the procedure was stopped. A antibiotic ointment/dry dressing were applied by clinical staff.  Final Clinical Impressions(s) / UC Diagnoses   Final diagnoses:  Abscess of left axilla   Partially drained abscess left underarm.  Wash wound with soap/water 1-2 times daily and apply antibiotic ointment/bandage.  Recheck for increasing redness/swelling/drainage/pain or new fever >100.5, or if not starting to improve in a few days.   New Prescriptions New Prescriptions   CEPHALEXIN (KEFLEX) 500 MG CAPSULE    Take 1 capsule (500 mg total) by mouth 2 (two) times daily.   IBUPROFEN (ADVIL,MOTRIN) 800 MG TABLET    Take 1 tablet (800 mg total) by mouth 3 (three) times daily.   SULFAMETHOXAZOLE-TRIMETHOPRIM (BACTRIM DS,SEPTRA DS) 800-160 MG TABLET    Take 1 tablet by mouth 2 (two) times daily.     Eustace MooreLaura W Dafna Romo, MD 01/04/16 2206

## 2016-01-02 NOTE — ED Triage Notes (Signed)
Here for abscess on left axilla onset 1 week associated w/drainage, pain and swelling   Denies fevers  A&O x4... NAD

## 2016-01-04 NOTE — ED Notes (Signed)
Patient called by today 01/04/2016 stating that she needed a prescription for diflucan. Per Dr. Piedad ClimesHonig, Called in Diflucan 150mg  1 po with no refills. [facility]

## 2016-07-05 ENCOUNTER — Encounter (HOSPITAL_COMMUNITY): Payer: Self-pay | Admitting: Emergency Medicine

## 2016-07-05 ENCOUNTER — Ambulatory Visit (HOSPITAL_COMMUNITY)
Admission: EM | Admit: 2016-07-05 | Discharge: 2016-07-05 | Disposition: A | Discharge status: Home / Self Care | Payer: Medicaid Other | Attending: Internal Medicine | Admitting: Internal Medicine

## 2016-07-05 DIAGNOSIS — B9789 Other viral agents as the cause of diseases classified elsewhere: Secondary | ICD-10-CM | POA: Diagnosis not present

## 2016-07-05 DIAGNOSIS — J069 Acute upper respiratory infection, unspecified: Secondary | ICD-10-CM | POA: Diagnosis not present

## 2016-07-05 MED ORDER — ONDANSETRON 4 MG PO TBDP: 4.0000 mg | ORAL_TABLET | Freq: Three times a day (TID) | ORAL | 0 refills | Status: DC | PRN

## 2016-07-05 MED ORDER — BENZONATATE 100 MG PO CAPS: 100.0000 mg | ORAL_CAPSULE | Freq: Three times a day (TID) | ORAL | 0 refills | Status: DC

## 2016-07-05 NOTE — ED Provider Notes (Signed)
CSN: [id number]     Arrival date & time 07/05/16  1602 History   None    Chief Complaint  Patient presents with  . URI   (Consider location/radiation/quality/duration/timing/severity/associated sxs/prior Treatment) Summer Cain is a 31 y.o. female who [facility] with a chief complaint of nausea, vomiting, cough, congestion for 5 days   The history is provided by the patient.  URI  Presenting symptoms: congestion, cough, fever and rhinorrhea   Presenting symptoms: no ear pain, no fatigue and no sore throat   Severity:  Moderate Onset quality:  Gradual Duration:  5 days Timing:  Constant Progression:  Improving Chronicity:  New Relieved by:  None tried Worsened by:  Nothing Ineffective treatments:  None tried Associated symptoms: no arthralgias, no neck pain, no sinus pain, no sneezing, no swollen glands and no wheezing   Associated symptoms comment:  Nausea, vomiting, and diarrhea   Past Medical History:  Diagnosis Date  . Scoliosis    Past Surgical History:  Procedure Laterality Date  . KIDNEY STONE SURGERY     Family History  Problem Relation Age of Onset  . Hypertension Mother   . Depression Mother   . Hyperlipidemia Father   . Diabetes Paternal Grandmother    Social History  Substance Use Topics  . Smoking status: Light Tobacco Smoker    Types: Cigarettes  . Smokeless tobacco: Never Used     Comment: uses vape pen primarily now  . Alcohol use Yes     Comment: not since found out was pregnant at 2 mths.   OB History    Gravida Para Term Preterm AB Living   5 2 1 1 3 2    SAB TAB Ectopic Multiple Live Births   2 1 0 0 2     Review of Systems  Constitutional: Positive for appetite change and fever. Negative for chills and fatigue.  HENT: Positive for congestion and rhinorrhea. Negative for ear pain, sinus pain, sneezing and sore throat.   Respiratory: Positive for cough. Negative for wheezing.   Cardiovascular: Negative.    Gastrointestinal: Positive for diarrhea, nausea and vomiting. Negative for abdominal pain and constipation.  Musculoskeletal: Negative for arthralgias, neck pain and neck stiffness.  Skin: Negative.   Neurological: Negative.     Allergies  Latex  Home Medications   Prior to Admission medications   Medication Sig Start Date End Date Taking? Authorizing Provider  acetaminophen (TYLENOL) 325 MG tablet Take 2 tablets (650 mg total) by mouth every 4 (four) hours as needed (for pain scale < 4). 11/08/15   Lorne SkeensSchenk, Nicholas Michael, MD  benzonatate (TESSALON) 100 MG capsule Take 1 capsule (100 mg total) by mouth every 8 (eight) hours. 07/05/16   Dorena BodoKennard, Izekiel Flegel, NP  cephALEXin (KEFLEX) 500 MG capsule Take 1 capsule (500 mg total) by mouth 2 (two) times daily. 01/02/16   Eustace MooreMurray, Laura W, MD  ondansetron (ZOFRAN ODT) 4 MG disintegrating tablet Take 1 tablet (4 mg total) by mouth every 8 (eight) hours as needed for nausea or vomiting. 07/05/16   Dorena BodoKennard, Kenecia Barren, NP  Prenatal Vit-Fe Fumarate-FA (PRENATAL MULTIVITAMIN) TABS tablet Take 1 tablet by mouth at bedtime.     [provider]  senna-docusate (SENOKOT-S) 8.6-50 MG tablet Take 2 tablets by mouth daily. 11/09/15   Lorne SkeensSchenk, Nicholas Michael, MD   Meds Ordered and Administered this Visit  Medications - No data to display  BP 92/66 (BP Location: Left Arm)   Pulse 71  Temp 97.6 F (36.4 C) (Oral)   Resp 20   LMP 07/05/2016   SpO2 99%   Breastfeeding? No  No data found.   Physical Exam  Constitutional: She is oriented to person, place, and time. She appears well-developed and well-nourished. She does not have a sickly appearance. She does not appear ill. No distress.  HENT:  Right Ear: Tympanic membrane and external ear normal.  Left Ear: Tympanic membrane and external ear normal.  Nose: Nose normal. Right sinus exhibits no maxillary sinus tenderness and no frontal sinus tenderness. Left sinus exhibits no maxillary sinus  tenderness and no frontal sinus tenderness.  Mouth/Throat: Uvula is midline and oropharynx is clear and moist. No oropharyngeal exudate.  Eyes: Conjunctivae are normal.  Neck: Normal range of motion. Neck supple. No JVD present.  Cardiovascular: Normal rate and regular rhythm.   Pulmonary/Chest: Effort normal and breath sounds normal. No respiratory distress. She has no wheezes.  Abdominal: Soft. Bowel sounds are normal. She exhibits no distension. There is no tenderness. There is no guarding.  Lymphadenopathy:    She has no cervical adenopathy.  Neurological: She is alert and oriented to person, place, and time.  Skin: Skin is warm and dry. Capillary refill takes less than 2 seconds. She is not diaphoretic.  Psychiatric: She has a normal mood and affect. Her behavior is normal.  Nursing note and vitals reviewed.   Urgent Care Course     Procedures (including critical care time)  Labs Review Labs Reviewed - No data to display  Imaging Review No results found.    MDM   1. Viral URI with cough    Most likely viral respiratory illness, given Zofran, Tessalon, right counseling on over-the-counter therapies for symptom management. Recommend rest, fluid, follow-up with primary care provider or return to clinic as needed.     Dorena Bodo, NP 07/05/16 1734

## 2016-07-05 NOTE — ED Triage Notes (Signed)
Pt c/o cold sx onset 1 week associated w/prod cough, CP, BA  A&O x4... NAD... Ambulatory

## 2016-07-05 NOTE — Discharge Instructions (Signed)
You most likely have a viral URI, this type of infection will not be helped by antibiotics. I advise rest, plenty of fluids and management of symptoms with over the counter medicines. For symptoms you may take Tylenol as needed every 4-6 hours for body aches or fever, not to exceed 4,000 mg a day, Take mucinex or mucinex DM ever 12 hours with a full glass of water, you may use an inhaled steroid such as Flonase, 2 sprays each nostril once a day for congestion, or an antihistamine such as Claritin or Zyrtec once a day. Another alternative for congestion, is a pseudoephedrine containing product available from the pharmacist. For cough, I have prescribed a medication called Tessalon. Take 1 tablet every 8 hours as needed for your cough. For Nausea, I have prescribed Zofran, take 1 tablet under the tongue every 8 hours as needed. Should your symptoms worsen or fail to resolve, follow up with your primary care provider or return to clinic.

## 2016-10-09 ENCOUNTER — Encounter (HOSPITAL_COMMUNITY): Payer: Self-pay | Admitting: Emergency Medicine

## 2016-10-09 ENCOUNTER — Ambulatory Visit (HOSPITAL_COMMUNITY)
Admission: EM | Admit: 2016-10-09 | Discharge: 2016-10-09 | Disposition: A | Discharge status: Home / Self Care | Payer: Medicaid Other | Attending: Family Medicine | Admitting: Family Medicine

## 2016-10-09 DIAGNOSIS — J40 Bronchitis, not specified as acute or chronic: Secondary | ICD-10-CM | POA: Diagnosis not present

## 2016-10-09 MED ORDER — AZITHROMYCIN 250 MG PO TABS: 250.0000 mg | ORAL_TABLET | Freq: Every day | ORAL | 0 refills | Status: DC

## 2016-10-09 MED ORDER — ALBUTEROL SULFATE HFA 108 (90 BASE) MCG/ACT IN AERS: 2.0000 | INHALATION_SPRAY | RESPIRATORY_TRACT | 0 refills | Status: DC | PRN

## 2016-10-09 NOTE — ED Triage Notes (Signed)
Patient thinks she has bronchitis.  Patient says she has chest tightness, frequent cough, yellow, green brown phlegm, sore throat

## 2016-10-09 NOTE — ED Provider Notes (Signed)
  [facility]   [id number] 10/09/16 Arrival Time: 1341  ASSESSMENT & PLAN:  1. Bronchitis     Meds ordered this encounter  Medications  . ibuprofen (ADVIL,MOTRIN) 200 MG tablet    Sig: Take 200 mg by mouth every 6 (six) hours as needed.  Marland Kitchen albuterol (PROVENTIL HFA;VENTOLIN HFA) 108 (90 Base) MCG/ACT inhaler    Sig: Inhale 2 puffs into the lungs every 4 (four) hours as needed for wheezing or shortness of breath.    Dispense:  1 Inhaler    Refill:  0  . azithromycin (ZITHROMAX) 250 MG tablet    Sig: Take 1 tablet (250 mg total) by mouth daily. Take first 2 tablets together, then 1 every day until finished.    Dispense:  6 tablet    Refill:  0    OTC symptom care as needed. May f/u as needed. Declines short course of prednisone.  Reviewed expectations re: course of current medical issues. Questions answered. Outlined signs and symptoms indicating need for more acute intervention. Patient verbalized understanding. After Visit Summary given.   SUBJECTIVE:  Carollee Nussbaumer is a 31 y.o. female who presents with complaint of nasal congestion, post-nasal drainage, and a persistent dry cough for greater than one week. Nasal symptoms better but cough continuing. Smoker. Wheezing at night. No OTC treatment. No SOB. Afebrile.  ROS: As per HPI.   OBJECTIVE:  Vitals:   10/09/16 1524  BP: 111/60  Pulse: 89  Resp: 18  Temp: 98.1 F (36.7 C)  TempSrc: Oral  SpO2: 98%     General appearance: alert; no distress HEENT: nasal congestion; clear runny nose; throat irritation secondary to post-nasal drainage Neck: supple without LAD Lungs: mild to moderate wheezing bilaterally; active cough Skin: warm and dry Psychological: alert and cooperative; normal mood and affect    Allergies  Allergen Reactions  . Latex Swelling    Past Medical History:  Diagnosis Date  . Scoliosis    Social History   Social History  . Marital status: Single    Spouse name: N/A  .  Number of children: N/A  . Years of education: N/A   Occupational History  . Not on file.   Social History Main Topics  . Smoking status: Light Tobacco Smoker    Types: Cigarettes  . Smokeless tobacco: Never Used     Comment: uses vape pen primarily now  . Alcohol use Yes     Comment: not since found out was pregnant at 2 mths.  . Drug use: Yes    Types: Marijuana     Comment: last marijuana use was 2016  . Sexual activity: Yes    Birth control/ protection: None   Other Topics Concern  . Not on file   Social History Narrative  . No narrative on file   Family History  Problem Relation Age of Onset  . Hypertension Mother   . Depression Mother   . Hyperlipidemia Father   . Diabetes Paternal Dulce Sellar, MD 10/09/16 [phone]

## 2016-12-20 IMAGING — US US MFM AMNIOCENTESIS
1 series · 14 of 24 positions shown · non-contrast
Comparison: none

[Series 1: us mfm amniocentesis · 24 acquisitions, 14 frames shown]
[im 1/24]
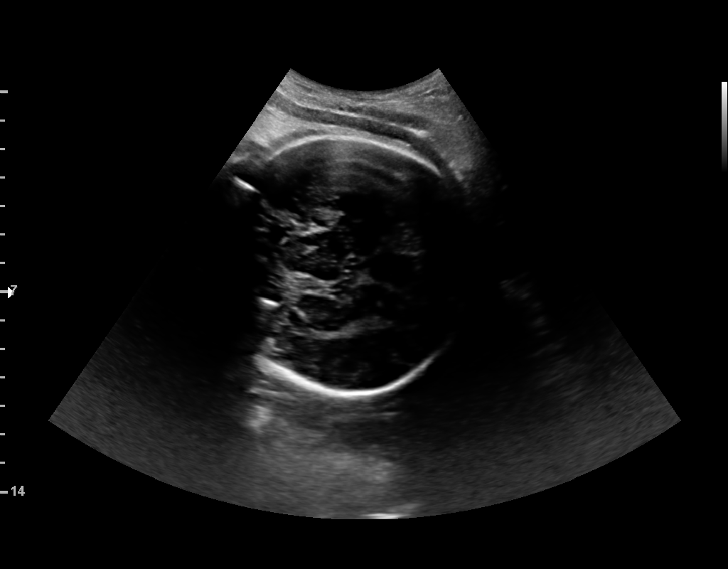
[im 3/24]
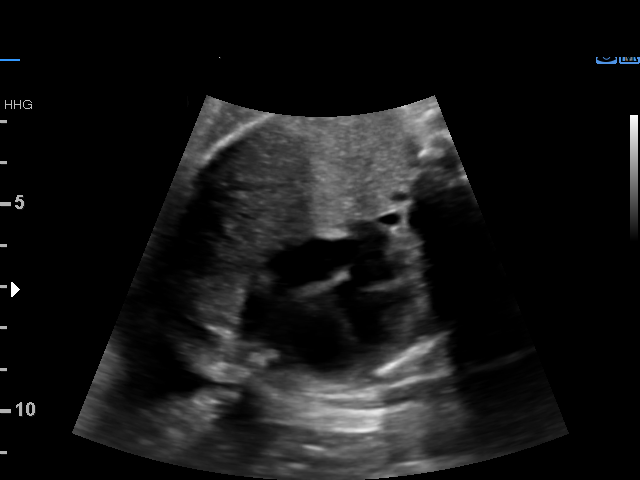
[im 5/24]
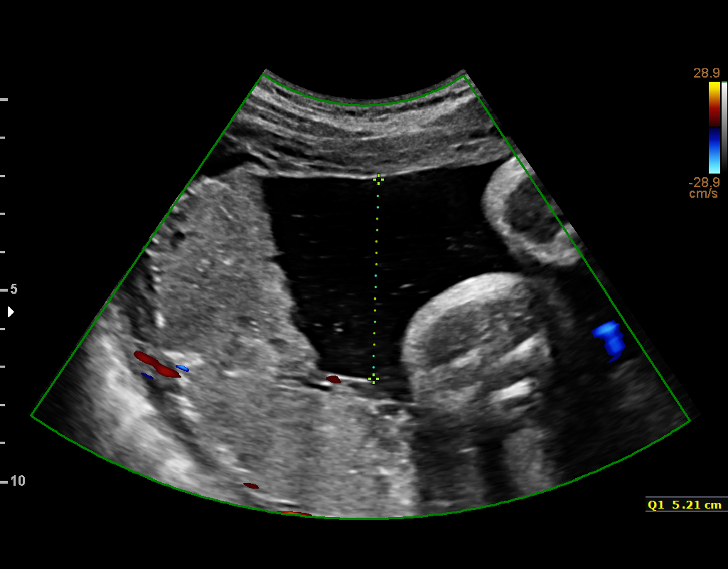
[im 7/24]
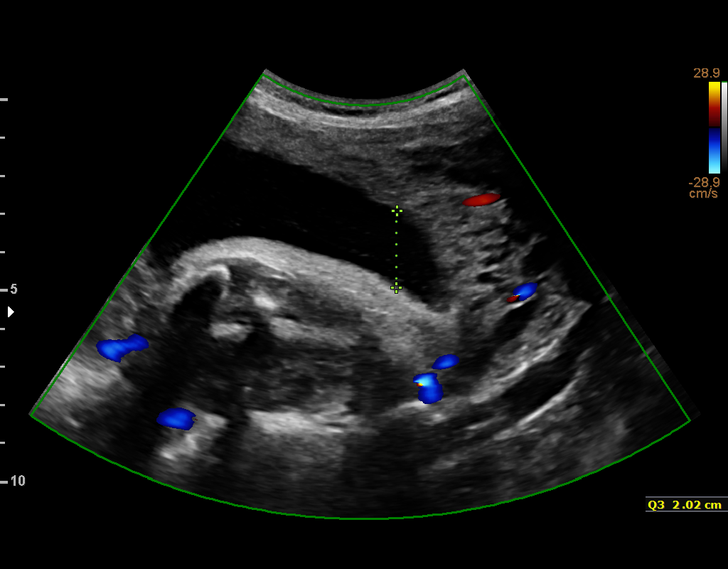
[im 8/24]
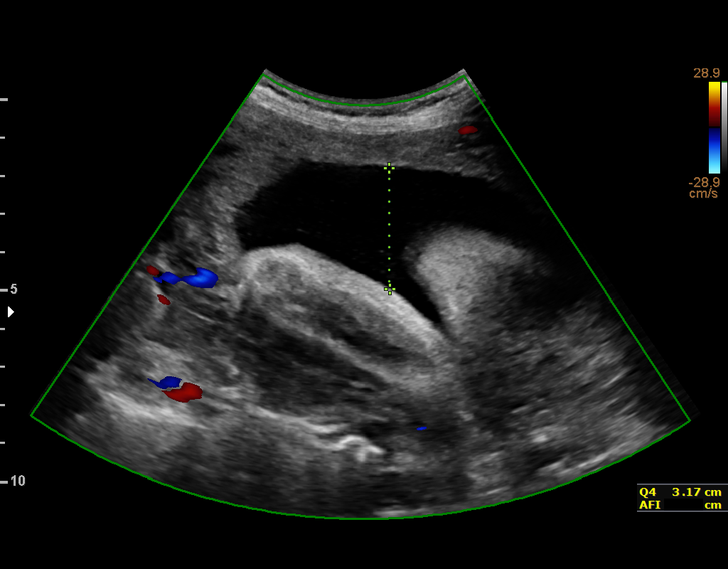
[im 10/24]
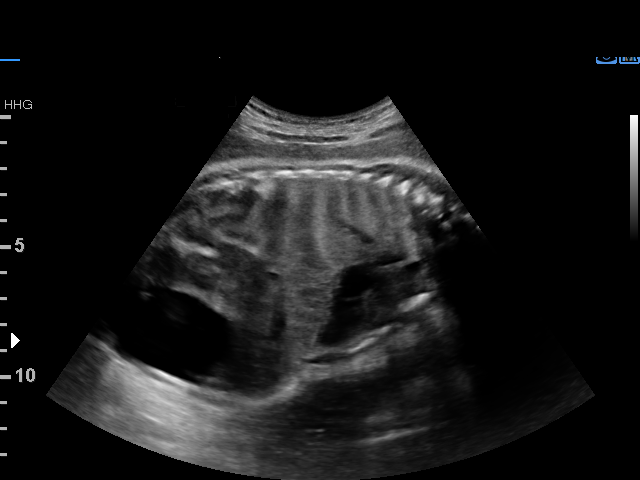
[im 12/24]
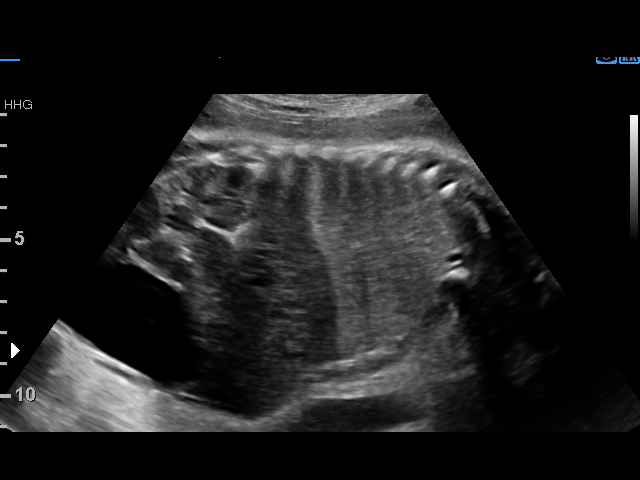
[im 13/24]
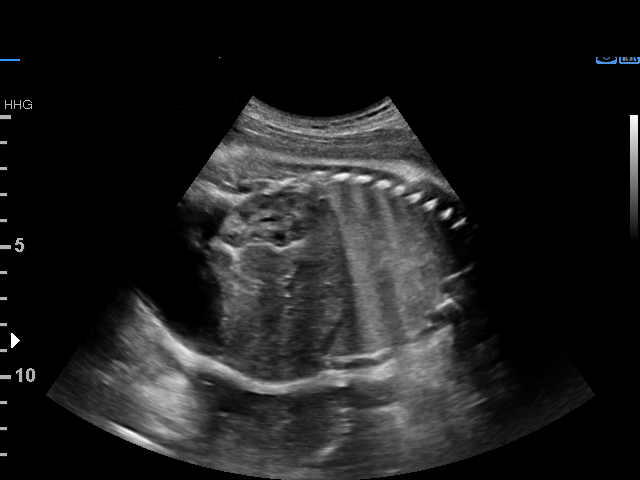
[im 15/24]
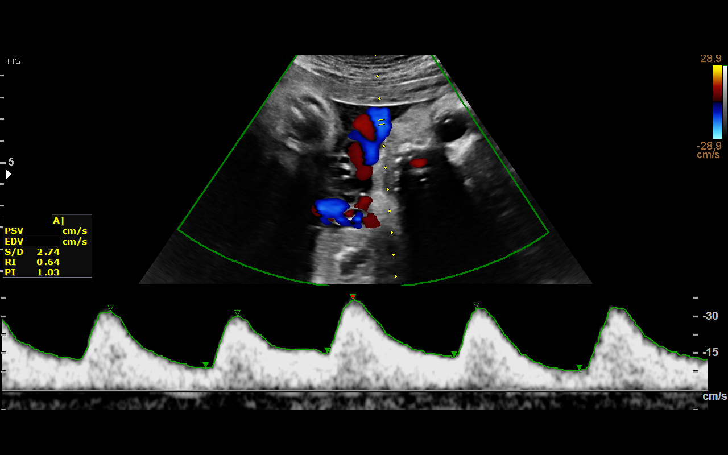
[im 17/24]
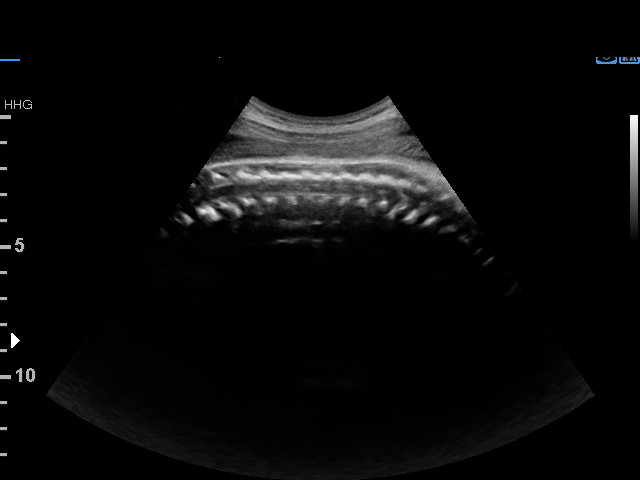
[im 19/24]
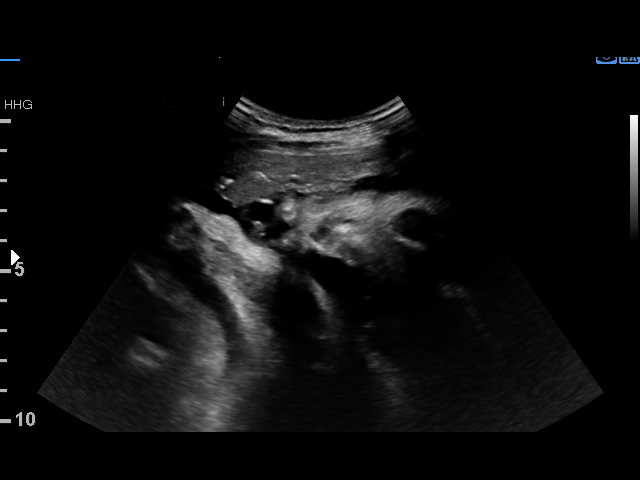
[im 20/24]
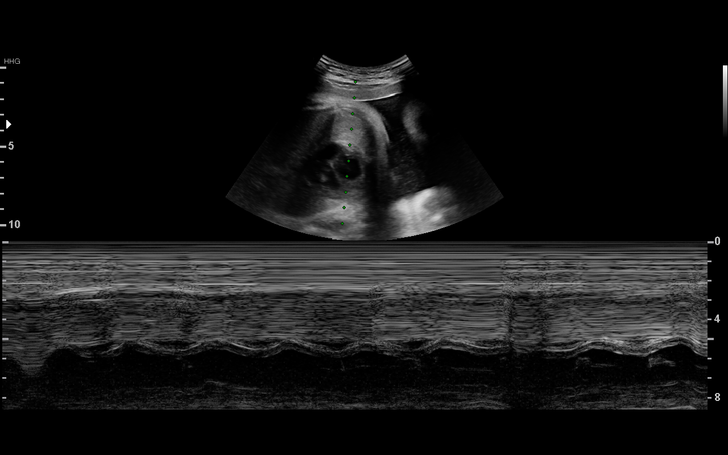
[im 22/24]
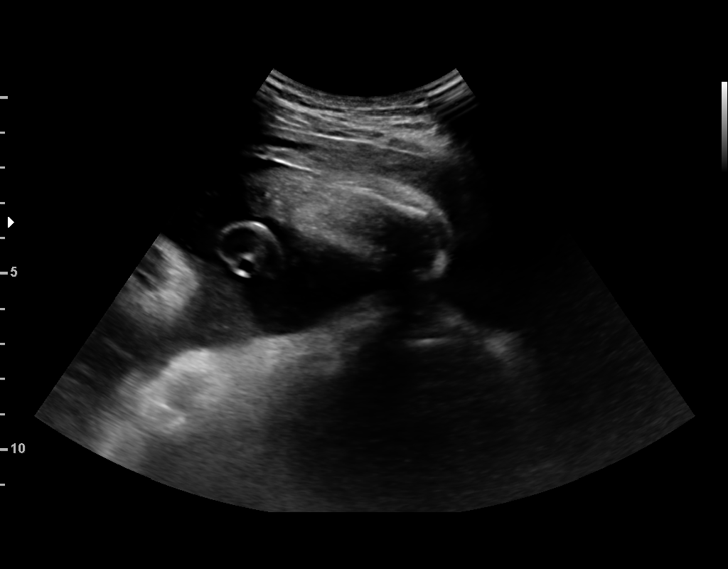
[im 24/24]
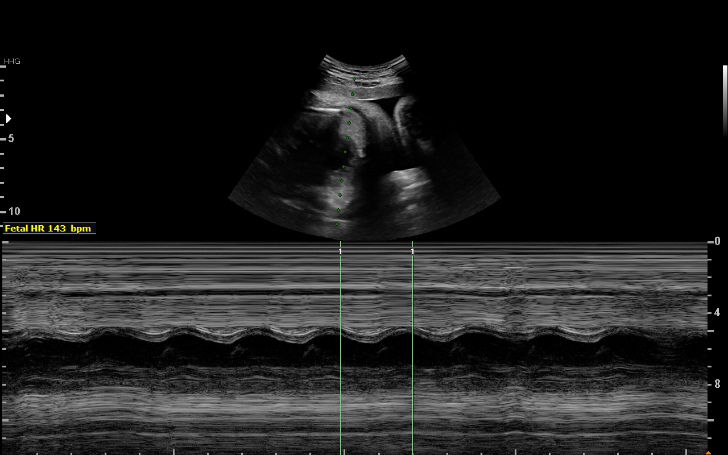

[14 of 24 positions shown; findings below may reference images not displayed]

1  NELDA ISSA            [id number]      [id number]     [id number]
2  NELDA ISSA            [id number]      [id number]     [id number]
3  KHEMAKHEM SIERRA            [id number]      [id number]     [id number]
Indications

39 weeks gestation of pregnancy
Late to prenatal care, third trimester
Poor obstetric history (prior pre-term labor)
Poor obstetric history: Previous preterm
delivery, antepartum; 35 weeks
Drug use complicating pregnancy, third
trimester
Tobacco use complicating pregnancy
OB History

Gravidity:    5         Term:   0        Prem:   1        SAB:   2
TOP:          1       Ectopic:  0        Living: 1
Fetal Evaluation

Num Of Fetuses:     1
Fetal Heart         140
Rate(bpm):
Cardiac Activity:   Observed
Presentation:       Cephalic
Placenta:           Posterior right
P. Cord Insertion:  Previously Visualized

Amniotic Fluid
AFI FV:      Subjectively within normal limits
AFI Sum(cm)     %Tile       Largest Pocket(cm)
14.96           61

RUQ(cm)       RLQ(cm)       LUQ(cm)        LLQ(cm)
5.21
Biophysical Evaluation

Amniotic F.V:   Within normal limits       F. Tone:        Observed
F. Movement:    Observed                   Score:          [DATE]
F. Breathing:   Observed
Gestational Age

LMP:           39w 1d       Date:   02/03/15                 EDD:   11/10/15
Best:          39w 1d    Det. By:   LMP  (02/03/15)          EDD:   11/10/15
Guided Procedures

Type:   Amniocentesis for fetal lung maturity

FH Post Procedure:     Normal             RH Type:          O+
Rh Immune Globulin:    Not required,      Discharge Inst.:  Post-procedure
Rh positive                          instructions
given
Needle Insertions:     22 gauge x 2       Vol. Withdrawn:   10 ml
Catheter Passes:       2 passes
Transabdominal:        Yes

Complications:  None

Comment:                    After informed consent and time out preformed, a 22 gu needle was inserted.  Had uterine
contraction - needle withdrawn and a second pass was required.  10 ml of clear amniotic fluid
was withdrawn.
Doppler - Fetal Vessels

Umbilical Artery
S/D     %tile     RI              PI              PSV
(cm/s)
2.69       74

Impression

Single IUP at 39 w 1d by dates
Suspected IUGR vs. poor dates (unable to obtain
documentation of previous ultrasounds)
UA Doppler studies normal for gestation
BPP [DATE]
Normal amniotic fluid volume

Amniocentesis was performed for fetal lung maturity.  After
initial needle insertion, a uterine contraction occurred and the
needle was withdrawn.  A second 22 gu spine was obtained
for a second pass - approximately 10 ml of clear amniotic
fluid was obtained without difficulty and sent for fetal lung
maturity studies.  The fetus was monitored for approximately
30 minutes following the procedure - reactive NST.
Recommendations

Once fetal lung maturity is documented, recommend moving
toward induction.  Will continue antenatal testing pending the
results.

## 2017-01-18 NOTE — L&D Delivery Note (Signed)
Pt states that she thought she was having BH ctx until about 40 minutes prior to delivery. She spent time getting her bags packed and her 2716 month old ready, but when she went to the bathroom, her water broke and she knew the baby was coming.  She got (back in ) the bathtub and delivered a viable female there. Wt 5# 4 oz.  38.5 weeks by LMP per pt. No PNC. States time was 0535.  Called EMS at that time.  Pt arrived w/placenta already delivered, bleeding normal.  1st degree perineal lac, hemostatic, pt declined repair.    Please schedule this patient for PP visit in: 4 weeks  pregnancy complicated by: no PNC. Hx opioid abuse last pregnancy Delivery mode:  SVD at home Anticipated Birth Control:  Plans Interval BTL PP Procedures needed: needs preop ASAP  Schedule Integrated BH visit: yes Provider: Any provider  Jacklyn ShellFrances Cresenzo-Dishmon 7:44 AM

## 2017-04-06 ENCOUNTER — Encounter (HOSPITAL_COMMUNITY): Payer: Self-pay | Admitting: Emergency Medicine

## 2017-04-06 ENCOUNTER — Ambulatory Visit (HOSPITAL_COMMUNITY)
Admission: EM | Admit: 2017-04-06 | Discharge: 2017-04-06 | Disposition: A | Discharge status: Home / Self Care | Payer: Medicaid Other | Attending: Family Medicine | Admitting: Family Medicine

## 2017-04-06 ENCOUNTER — Other Ambulatory Visit: Payer: Self-pay

## 2017-04-06 DIAGNOSIS — Z3A38 38 weeks gestation of pregnancy: Secondary | ICD-10-CM | POA: Insufficient documentation

## 2017-04-06 DIAGNOSIS — J069 Acute upper respiratory infection, unspecified: Secondary | ICD-10-CM | POA: Insufficient documentation

## 2017-04-06 DIAGNOSIS — F17210 Nicotine dependence, cigarettes, uncomplicated: Secondary | ICD-10-CM | POA: Insufficient documentation

## 2017-04-06 DIAGNOSIS — O99333 Smoking (tobacco) complicating pregnancy, third trimester: Secondary | ICD-10-CM | POA: Diagnosis not present

## 2017-04-06 DIAGNOSIS — Z79899 Other long term (current) drug therapy: Secondary | ICD-10-CM | POA: Insufficient documentation

## 2017-04-06 DIAGNOSIS — O99513 Diseases of the respiratory system complicating pregnancy, third trimester: Secondary | ICD-10-CM | POA: Diagnosis not present

## 2017-04-06 DIAGNOSIS — K5900 Constipation, unspecified: Secondary | ICD-10-CM | POA: Diagnosis not present

## 2017-04-06 DIAGNOSIS — R05 Cough: Secondary | ICD-10-CM | POA: Diagnosis not present

## 2017-04-06 DIAGNOSIS — B9789 Other viral agents as the cause of diseases classified elsewhere: Secondary | ICD-10-CM | POA: Diagnosis not present

## 2017-04-06 DIAGNOSIS — M419 Scoliosis, unspecified: Secondary | ICD-10-CM | POA: Diagnosis not present

## 2017-04-06 DIAGNOSIS — R0981 Nasal congestion: Secondary | ICD-10-CM | POA: Diagnosis not present

## 2017-04-06 HISTORY — DX: Encounter for supervision of normal pregnancy, unspecified, unspecified trimester: Z34.90

## 2017-04-06 LAB — POCT RAPID STREP A: Streptococcus, Group A Screen (Direct): NEGATIVE

## 2017-04-06 MED ORDER — CETIRIZINE HCL 10 MG PO CAPS: 10.0000 mg | ORAL_CAPSULE | Freq: Every day | ORAL | 0 refills | Status: DC

## 2017-04-06 MED ORDER — GUAIFENESIN-DM 100-10 MG/5ML PO SYRP: 5.0000 mL | ORAL_SOLUTION | ORAL | 0 refills | Status: DC | PRN

## 2017-04-06 MED ORDER — SALINE NASAL SPRAY 0.65 % NA SOLN: 1.0000 | NASAL | 12 refills | Status: DC | PRN

## 2017-04-06 MED ORDER — POLYETHYLENE GLYCOL 3350 17 GM/SCOOP PO POWD: 17.0000 g | Freq: Once | ORAL | 0 refills | Status: DC

## 2017-04-06 MED ORDER — POLYETHYLENE GLYCOL 3350 17 GM/SCOOP PO POWD: 17.0000 g | Freq: Once | ORAL | 0 refills | Status: AC

## 2017-04-06 NOTE — Discharge Instructions (Signed)
Please use Zyrtec and saline nasal spray for congestion.  You may use Robitussin-DM for cough every 6 hours.  Please use capful of MiraLAX daily to help with constipation.

## 2017-04-06 NOTE — ED Triage Notes (Signed)
Pt. Stated, I've had body aches, constipated, nasal congestion.  Pt is preganant

## 2017-04-06 NOTE — ED Provider Notes (Signed)
[facility]    CSN: [id number] Arrival date & time: 04/06/17  1859     History   Chief Complaint Chief Complaint  Patient presents with  . Nasal Congestion  . Cough    HPI Summer Cain is a 32 y.o. female currently pregnant, due in 9 days presenting today with backache, constipation, congestion and cough.  Her main concern is the cough as it has been causing chest discomfort.  Symptoms began last Thursday and have persisted for approximately 4-5 days.  Denies fevers.  Patient has not taken anything.  Tolerating oral intake.  Denies nausea or vomiting.  States that she had a bowel movement 2 days ago, but prior to that she had not had a bowel movement for 2 weeks.  HPI  Past Medical History:  Diagnosis Date  . Pregnant   . Scoliosis     Patient Active Problem List   Diagnosis Date Noted  . IUGR (intrauterine growth restriction) affecting care of mother 11/06/2015  . IUGR (intrauterine growth restriction) affecting care of mother, third trimester 10/29/2015  . Dental infection 10/21/2015  . Insufficient prenatal care in third trimester 10/08/2015  . Supervision of high risk pregnancy, antepartum 10/01/2015  . History of preterm delivery, currently pregnant in third trimester 10/01/2015  . THC and opiate use complicating pregnancy, antepartum 09/26/2015    Past Surgical History:  Procedure Laterality Date  . KIDNEY STONE SURGERY      OB History    Gravida Para Term Preterm AB Living   5 2 1 1 3 2    SAB TAB Ectopic Multiple Live Births   2 1 0 0 2       Home Medications    Prior to Admission medications   Medication Sig Start Date End Date Taking? Authorizing Provider  acetaminophen (TYLENOL) 325 MG tablet Take 2 tablets (650 mg total) by mouth every 4 (four) hours as needed (for pain scale < 4). 11/08/15   Lorne Skeens, MD  albuterol (PROVENTIL HFA;VENTOLIN HFA) 108 (90 Base) MCG/ACT inhaler Inhale 2 puffs into the lungs every 4 (four)  hours as needed for wheezing or shortness of breath. 10/09/16   Mardella Layman, MD  azithromycin (ZITHROMAX) 250 MG tablet Take 1 tablet (250 mg total) by mouth daily. Take first 2 tablets together, then 1 every day until finished. 10/09/16   Mardella Layman, MD  benzonatate (TESSALON) 100 MG capsule Take 1 capsule (100 mg total) by mouth every 8 (eight) hours. 07/05/16   Dorena Bodo, NP  cephALEXin (KEFLEX) 500 MG capsule Take 1 capsule (500 mg total) by mouth 2 (two) times daily. 01/02/16   Isa Rankin, MD  Cetirizine HCl 10 MG CAPS Take 1 capsule (10 mg total) by mouth daily for 10 days. 04/06/17 04/16/17  Tyffani Foglesong C, PA-C  guaiFENesin-dextromethorphan (ROBITUSSIN DM) 100-10 MG/5ML syrup Take 5 mLs by mouth every 4 (four) hours as needed for up to 10 days for cough. 04/06/17 04/16/17  Costas Sena C, PA-C  ibuprofen (ADVIL,MOTRIN) 200 MG tablet Take 200 mg by mouth every 6 (six) hours as needed.    [provider]  ondansetron (ZOFRAN ODT) 4 MG disintegrating tablet Take 1 tablet (4 mg total) by mouth every 8 (eight) hours as needed for nausea or vomiting. 07/05/16   Dorena Bodo, NP  polyethylene glycol powder (GLYCOLAX/MIRALAX) powder Take 17 g by mouth once for 1 dose. 04/06/17 04/06/17  Carlyon Nolasco, Junius Creamer, PA-C  Prenatal Vit-Fe Fumarate-FA (PRENATAL MULTIVITAMIN) TABS tablet Take  1 tablet by mouth at bedtime.     [provider]  senna-docusate (SENOKOT-S) 8.6-50 MG tablet Take 2 tablets by mouth daily. 11/09/15   Lorne Skeens, MD  sodium chloride (OCEAN) 0.65 % nasal spray Place 1 spray into the nose as needed for congestion. 04/06/17   Shadeed Colberg, Junius Creamer, PA-C    Family History Family History  Problem Relation Age of Onset  . Hypertension Mother   . Depression Mother   . Hyperlipidemia Father   . Diabetes Paternal Grandmother     Social History Social History   Tobacco Use  . Smoking status: Light Tobacco Smoker    Types: Cigarettes  .  Smokeless tobacco: Never Used  . Tobacco comment: uses vape pen primarily now  Substance Use Topics  . Alcohol use: Yes    Comment: not since found out was pregnant at 2 mths.  . Drug use: Yes    Types: Marijuana    Comment: last marijuana use was 2016     Allergies   Latex   Review of Systems Review of Systems  Constitutional: Negative for chills, fatigue and fever.  HENT: Positive for congestion, rhinorrhea and sore throat. Negative for ear pain, sinus pressure and trouble swallowing.   Respiratory: Positive for cough. Negative for chest tightness and shortness of breath.   Cardiovascular: Negative for chest pain.  Gastrointestinal: Positive for constipation. Negative for abdominal pain, nausea and vomiting.  Musculoskeletal: Positive for back pain and myalgias.  Skin: Negative for rash.  Neurological: Negative for dizziness, light-headedness and headaches.     Physical Exam Triage Vital Signs ED Triage Vitals  Enc Vitals Group     BP --      Pulse Rate 04/06/17 2021 81     Resp 04/06/17 2021 18     Temp 04/06/17 2021 (!) 97.4 F (36.3 C)     Temp Source 04/06/17 2021 Oral     SpO2 04/06/17 2021 100 %     Weight 04/06/17 2019 140 lb (63.5 kg)     Height 04/06/17 2019 (!) 5.25" (0.133 m)     Head Circumference --      Peak Flow --      Pain Score 04/06/17 2019 4     Pain Loc --      Pain Edu? --      Excl. in GC? --    No data found.  Updated Vital Signs Pulse 81   Temp (!) 97.4 F (36.3 C) (Oral)   Resp 18   Ht (!) 5.25" (0.133 m)   Wt 140 lb (63.5 kg)   LMP 04/06/2016   SpO2 100%   BMI 3571.18 kg/m   Visual Acuity Right Eye Distance:   Left Eye Distance:   Bilateral Distance:    Right Eye Near:   Left Eye Near:    Bilateral Near:     Physical Exam  Constitutional: She appears well-developed and well-nourished. No distress.  HENT:  Head: Normocephalic and atraumatic.  Eyes: Conjunctivae are normal.  Neck: Neck supple.  Cardiovascular:  Normal rate and regular rhythm.  No murmur heard. Pulmonary/Chest: Effort normal and breath sounds normal. No respiratory distress.  Breathing comfortably at rest, CTA BL  Abdominal:  Pregnant  Musculoskeletal: She exhibits no edema.  Neurological: She is alert.  Skin: Skin is warm and dry.  Psychiatric: She has a normal mood and affect.  Nursing note and vitals reviewed.    UC Treatments / Results  Labs (all labs  ordered are listed, but only abnormal results are displayed) Labs Reviewed  CULTURE, GROUP A STREP [facility])  POCT RAPID STREP A    EKG  EKG Interpretation None       Radiology No results found.  Procedures Procedures (including critical care time)  Medications Ordered in UC Medications - No data to display   Initial Impression / Assessment and Plan / UC Course  I have reviewed the triage vital signs and the nursing notes.  Pertinent labs & imaging results that were available during my care of the patient were reviewed by me and considered in my medical decision making (see chart for details).     Pregnant patient with cough and congestion.  Will start on Zyrtec, Robitussin-DM and Ocean nasal spray.  Provided list of safe medications in pregnancy.  Will begin on MiraLAX daily.  Discussed strict return precautions. Patient verbalized understanding and is agreeable with plan.   Final Clinical Impressions(s) / UC Diagnoses   Final diagnoses:  Viral URI with cough  Constipation, unspecified constipation type    ED Discharge Orders        Ordered    guaiFENesin-dextromethorphan (ROBITUSSIN DM) 100-10 MG/5ML syrup  Every 4 hours PRN,   Status:  Discontinued     04/06/17 2101    Cetirizine HCl 10 MG CAPS  Daily,   Status:  Discontinued     04/06/17 2101    sodium chloride (OCEAN) 0.65 % nasal spray  As needed,   Status:  Discontinued     04/06/17 2101    polyethylene glycol powder (GLYCOLAX/MIRALAX) powder   Once,   Status:  Discontinued      04/06/17 2103    Cetirizine HCl 10 MG CAPS  Daily     04/06/17 2108    guaiFENesin-dextromethorphan (ROBITUSSIN DM) 100-10 MG/5ML syrup  Every 4 hours PRN     04/06/17 2108    polyethylene glycol powder (GLYCOLAX/MIRALAX) powder   Once     04/06/17 2108    sodium chloride (OCEAN) 0.65 % nasal spray  As needed     04/06/17 2108       Controlled Substance Prescriptions [place] Controlled Substance Registry consulted? Not Applicable   Lew DawesWieters, Kaylob Wallen C, [place] JerseyPA-C 04/06/17 2122

## 2017-04-08 ENCOUNTER — Inpatient Hospital Stay (HOSPITAL_COMMUNITY)
Admission: AD | Admit: 2017-04-08 | Discharge: 2017-04-10 | DRG: 776 | Disposition: A | Discharge status: Home / Self Care | Payer: Medicaid Other | Source: Ambulatory Visit | Attending: Obstetrics and Gynecology | Admitting: Obstetrics and Gynecology

## 2017-04-08 ENCOUNTER — Encounter (HOSPITAL_COMMUNITY): Payer: Self-pay

## 2017-04-08 DIAGNOSIS — Z3A38 38 weeks gestation of pregnancy: Secondary | ICD-10-CM | POA: Diagnosis not present

## 2017-04-08 DIAGNOSIS — O365931 Maternal care for other known or suspected poor fetal growth, third trimester, fetus 1: Secondary | ICD-10-CM | POA: Diagnosis present

## 2017-04-08 DIAGNOSIS — O0990 Supervision of high risk pregnancy, unspecified, unspecified trimester: Secondary | ICD-10-CM

## 2017-04-08 DIAGNOSIS — O09213 Supervision of pregnancy with history of pre-term labor, third trimester: Secondary | ICD-10-CM

## 2017-04-08 DIAGNOSIS — Z23 Encounter for immunization: Secondary | ICD-10-CM

## 2017-04-08 DIAGNOSIS — O09893 Supervision of other high risk pregnancies, third trimester: Secondary | ICD-10-CM

## 2017-04-08 DIAGNOSIS — F1910 Other psychoactive substance abuse, uncomplicated: Secondary | ICD-10-CM | POA: Diagnosis present

## 2017-04-08 DIAGNOSIS — O0933 Supervision of pregnancy with insufficient antenatal care, third trimester: Secondary | ICD-10-CM

## 2017-04-08 DIAGNOSIS — O99320 Drug use complicating pregnancy, unspecified trimester: Secondary | ICD-10-CM | POA: Diagnosis present

## 2017-04-08 LAB — DIFFERENTIAL
BAND NEUTROPHILS: 0 %
BASOS PCT: 0 %
BLASTS: 0 %
Basophils Absolute: 0 10*3/uL (ref 0.0–0.1)
EOS PCT: 1 %
Eosinophils Absolute: 0.1 10*3/uL (ref 0.0–0.7)
LYMPHS PCT: 16 %
Lymphs Abs: 2.2 10*3/uL (ref 0.7–4.0)
MONOS PCT: 3 %
Metamyelocytes Relative: 0 %
Monocytes Absolute: 0.4 10*3/uL (ref 0.1–1.0)
Myelocytes: 0 %
NEUTROS PCT: 80 %
NRBC: 0 /100{WBCs}
Neutro Abs: 11 10*3/uL — ABNORMAL HIGH (ref 1.7–7.7)
OTHER: 0 %
Promyelocytes Absolute: 0 %

## 2017-04-08 LAB — HEPATITIS B SURFACE ANTIGEN: HEP B S AG: NEGATIVE

## 2017-04-08 LAB — RAPID HIV SCREEN (HIV 1/2 AB+AG)
HIV 1/2 Antibodies: NONREACTIVE
HIV-1 P24 Antigen - HIV24: NONREACTIVE

## 2017-04-08 LAB — CBC
HCT: 32.4 % — ABNORMAL LOW (ref 36.0–46.0)
HEMOGLOBIN: 11.2 g/dL — AB (ref 12.0–15.0)
MCH: 30.1 pg (ref 26.0–34.0)
MCHC: 34.6 g/dL (ref 30.0–36.0)
MCV: 87.1 fL (ref 78.0–100.0)
Platelets: 159 10*3/uL (ref 150–400)
RBC: 3.72 MIL/uL — AB (ref 3.87–5.11)
RDW: 13.3 % (ref 11.5–15.5)
WBC: 13.7 10*3/uL — AB (ref 4.0–10.5)

## 2017-04-08 LAB — TYPE AND SCREEN
ABO/RH(D): O POS
Antibody Screen: NEGATIVE

## 2017-04-08 LAB — RPR: RPR: NONREACTIVE

## 2017-04-08 MED ORDER — BENZOCAINE-MENTHOL 20-0.5 % EX AERO: 1.0000 "application " | INHALATION_SPRAY | CUTANEOUS | Status: DC | PRN

## 2017-04-08 MED ORDER — METHYLERGONOVINE MALEATE 0.2 MG PO TABS: 0.2000 mg | ORAL_TABLET | ORAL | Status: DC | PRN

## 2017-04-08 MED ORDER — ACETAMINOPHEN 325 MG PO TABS
650.0000 mg | ORAL_TABLET | ORAL | Status: DC | PRN
  Administered 2017-04-08 – 2017-04-10 (×3): 650 mg via ORAL
  Filled 2017-04-08 (×3): qty 2

## 2017-04-08 MED ORDER — TETANUS-DIPHTH-ACELL PERTUSSIS 5-2.5-18.5 LF-MCG/0.5 IM SUSP
0.5000 mL | Freq: Once | INTRAMUSCULAR | Status: AC
  Administered 2017-04-09: 0.5 mL via INTRAMUSCULAR
  Filled 2017-04-08: qty 0.5

## 2017-04-08 MED ORDER — LIDOCAINE HCL (PF) 1 % IJ SOLN
INTRAMUSCULAR | Status: AC
  Filled 2017-04-08: qty 30

## 2017-04-08 MED ORDER — HYDROXYZINE HCL 50 MG PO TABS
50.0000 mg | ORAL_TABLET | Freq: Four times a day (QID) | ORAL | Status: DC | PRN
  Filled 2017-04-08: qty 1

## 2017-04-08 MED ORDER — LIDOCAINE HCL (PF) 1 % IJ SOLN
30.0000 mL | INTRAMUSCULAR | Status: AC | PRN
  Administered 2017-04-08: 30 mL via SUBCUTANEOUS
  Filled 2017-04-08: qty 30

## 2017-04-08 MED ORDER — FERROUS SULFATE 325 (65 FE) MG PO TABS
325.0000 mg | ORAL_TABLET | Freq: Two times a day (BID) | ORAL | Status: DC
  Administered 2017-04-09 – 2017-04-10 (×3): 325 mg via ORAL
  Filled 2017-04-08 (×3): qty 1

## 2017-04-08 MED ORDER — METHYLERGONOVINE MALEATE 0.2 MG/ML IJ SOLN: 0.2000 mg | INTRAMUSCULAR | Status: DC | PRN

## 2017-04-08 MED ORDER — LIDOCAINE HCL (PF) 1 % IJ SOLN: 30.0000 mL | Freq: Once | INTRAMUSCULAR | Status: DC

## 2017-04-08 MED ORDER — SOD CITRATE-CITRIC ACID 500-334 MG/5ML PO SOLN: 30.0000 mL | ORAL | Status: DC | PRN

## 2017-04-08 MED ORDER — ONDANSETRON HCL 4 MG PO TABS: 4.0000 mg | ORAL_TABLET | ORAL | Status: DC | PRN

## 2017-04-08 MED ORDER — DIBUCAINE 1 % RE OINT: 1.0000 "application " | TOPICAL_OINTMENT | RECTAL | Status: DC | PRN

## 2017-04-08 MED ORDER — DIPHENHYDRAMINE HCL 25 MG PO CAPS
25.0000 mg | ORAL_CAPSULE | Freq: Four times a day (QID) | ORAL | Status: DC | PRN
  Administered 2017-04-10: 25 mg via ORAL
  Filled 2017-04-08: qty 1

## 2017-04-08 MED ORDER — ONDANSETRON HCL 4 MG/2ML IJ SOLN: 4.0000 mg | Freq: Four times a day (QID) | INTRAMUSCULAR | Status: DC | PRN

## 2017-04-08 MED ORDER — PRENATAL MULTIVITAMIN CH
1.0000 | ORAL_TABLET | Freq: Every day | ORAL | Status: DC
  Administered 2017-04-09 – 2017-04-10 (×2): 1 via ORAL
  Filled 2017-04-08 (×2): qty 1

## 2017-04-08 MED ORDER — ONDANSETRON HCL 4 MG/2ML IJ SOLN: 4.0000 mg | INTRAMUSCULAR | Status: DC | PRN

## 2017-04-08 MED ORDER — IBUPROFEN 600 MG PO TABS
600.0000 mg | ORAL_TABLET | Freq: Four times a day (QID) | ORAL | Status: DC
  Administered 2017-04-08 – 2017-04-10 (×10): 600 mg via ORAL
  Filled 2017-04-08 (×10): qty 1

## 2017-04-08 MED ORDER — INFLUENZA VAC SPLIT QUAD 0.5 ML IM SUSY
0.5000 mL | PREFILLED_SYRINGE | INTRAMUSCULAR | Status: AC
  Administered 2017-04-09: 0.5 mL via INTRAMUSCULAR
  Filled 2017-04-08: qty 0.5

## 2017-04-08 MED ORDER — ACETAMINOPHEN 325 MG PO TABS
650.0000 mg | ORAL_TABLET | ORAL | Status: DC | PRN
  Administered 2017-04-08: 650 mg via ORAL
  Filled 2017-04-08: qty 2

## 2017-04-08 MED ORDER — BISACODYL 10 MG RE SUPP: 10.0000 mg | Freq: Every day | RECTAL | Status: DC | PRN

## 2017-04-08 MED ORDER — SIMETHICONE 80 MG PO CHEW: 80.0000 mg | CHEWABLE_TABLET | ORAL | Status: DC | PRN

## 2017-04-08 MED ORDER — FLEET ENEMA 7-19 GM/118ML RE ENEM: 1.0000 | ENEMA | RECTAL | Status: DC | PRN

## 2017-04-08 MED ORDER — LACTATED RINGERS IV SOLN: INTRAVENOUS | Status: DC

## 2017-04-08 MED ORDER — MEASLES, MUMPS & RUBELLA VAC ~~LOC~~ INJ
0.5000 mL | INJECTION | Freq: Once | SUBCUTANEOUS | Status: DC
  Filled 2017-04-08: qty 0.5

## 2017-04-08 MED ORDER — WITCH HAZEL-GLYCERIN EX PADS: 1.0000 "application " | MEDICATED_PAD | CUTANEOUS | Status: DC | PRN

## 2017-04-08 MED ORDER — DOCUSATE SODIUM 100 MG PO CAPS
100.0000 mg | ORAL_CAPSULE | Freq: Two times a day (BID) | ORAL | Status: DC
  Administered 2017-04-08 – 2017-04-10 (×4): 100 mg via ORAL
  Filled 2017-04-08 (×4): qty 1

## 2017-04-08 MED ORDER — COCONUT OIL OIL: 1.0000 "application " | TOPICAL_OIL | Status: DC | PRN

## 2017-04-08 MED ORDER — FLEET ENEMA 7-19 GM/118ML RE ENEM: 1.0000 | ENEMA | Freq: Every day | RECTAL | Status: DC | PRN

## 2017-04-08 NOTE — Progress Notes (Signed)
CSW made CPS report with CPS worker, K. Herndon.  CPS will follow-up with CSW regarding acceptance of report.  There are barriers to d/c.  Hiroko Tregre Boyd-Gilyard, MSW, LCSW Clinical Social Work [phone]  

## 2017-04-08 NOTE — Plan of Care (Signed)
Admission education completed.  MOB voiced understanding.  She is able to tolerate increased activity and is ambulating in room by herself.

## 2017-04-08 NOTE — Progress Notes (Signed)
CSW acknowledges consult and completed clinical assessment.  Clinical documentation will follow.  There are barriers to d/c. CSW left a message for [place] CPS Intake Worker (Pam Miller) and requested a return call to complete a CPS report.  Freddie Nghiem Boyd-Gilyard, MSW, LCSW Clinical Social Work [phone] 

## 2017-04-08 NOTE — H&P (Signed)
Summer Cain is a 32 y.o. female [phone] with IUP at Unknown presenting for delivered at home at 0535.  Arrived via EMS with placenta delivered, bleeding normal. See Delivery summary for details. . Pt did not have any prenatal care this pregnancy 38.5 by LMP .  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 Pregnant   . Scoliosis     Past Surgical History: Past Surgical History:  Procedure Laterality Date  . KIDNEY STONE SURGERY      Obstetrical History: OB History    Gravida  6   Para  2   Term  1   Preterm  1   AB  3   Living  2     SAB  2   TAB  1   Ectopic  0   Multiple  0   Live Births  2            Social History: Social History   Socioeconomic History  . Marital status: Single    Spouse name: Not on file  . Number of children: Not on file  . Years of education: Not on file  . Highest education level: Not on file  Occupational History  . Not on file  Social Needs  . Financial resource strain: Not on file  . Food insecurity:    Worry: Not on file    Inability: Not on file  . Transportation needs:    Medical: Not on file    Non-medical: Not on file  Tobacco Use  . Smoking status: Light Tobacco Smoker    Types: Cigarettes  . Smokeless tobacco: Never Used  . Tobacco comment: uses vape pen primarily now  Substance and Sexual Activity  . Alcohol use: Yes    Comment: not since found out was pregnant at 2 mths.  . Drug use: Yes    Types: Marijuana    Comment: last marijuana use was "couple months ago"-pt states she has prescriptions for anxiety meds that she takes-unable to provide strength or name of meds  . Sexual activity: Yes    Birth control/protection: None  Lifestyle  . Physical activity:    Days per week: Not on file     Minutes per session: Not on file  . Stress: Not on file  Relationships  . Social connections:    Talks on phone: Not on file    Gets together: Not on file    Attends religious service: Not on file    Active member of club or organization: Not on file    Attends meetings of clubs or organizations: Not on file    Relationship status: Not on file  Other Topics Concern  . Not on file  Social History Narrative  . Not on file    Family History: Family History  Problem Relation Age of Onset  . Hypertension Mother   . Depression Mother   . Hyperlipidemia Father   . Diabetes Paternal Grandmother     Allergies: Allergies  Allergen Reactions  . Latex Swelling    Medications Prior to Admission  Medication Sig Dispense Refill Last Dose  . acetaminophen (TYLENOL) 325 MG tablet Take 2 tablets (650 mg total) by mouth every 4 (four) hours as needed (for pain scale < 4). 30 tablet 0  Unknown at Unknown time  . albuterol (PROVENTIL HFA;VENTOLIN HFA) 108 (90 Base) MCG/ACT inhaler Inhale 2 puffs into the lungs every 4 (four) hours as needed for wheezing or shortness of breath. 1 Inhaler 0   . azithromycin (ZITHROMAX) 250 MG tablet Take 1 tablet (250 mg total) by mouth daily. Take first 2 tablets together, then 1 every day until finished. 6 tablet 0   . benzonatate (TESSALON) 100 MG capsule Take 1 capsule (100 mg total) by mouth every 8 (eight) hours. 21 capsule 0   . cephALEXin (KEFLEX) 500 MG capsule Take 1 capsule (500 mg total) by mouth 2 (two) times daily. 20 capsule 0 Unknown at Unknown time  . Cetirizine HCl 10 MG CAPS Take 1 capsule (10 mg total) by mouth daily for 10 days. 10 capsule 0   . guaiFENesin-dextromethorphan (ROBITUSSIN DM) 100-10 MG/5ML syrup Take 5 mLs by mouth every 4 (four) hours as needed for up to 10 days for cough. 118 mL 0   . ibuprofen (ADVIL,MOTRIN) 200 MG tablet Take 200 mg by mouth every 6 (six) hours as needed.     . ondansetron (ZOFRAN ODT) 4 MG disintegrating  tablet Take 1 tablet (4 mg total) by mouth every 8 (eight) hours as needed for nausea or vomiting. 20 tablet 0   . Prenatal Vit-Fe Fumarate-FA (PRENATAL MULTIVITAMIN) TABS tablet Take 1 tablet by mouth at bedtime.    Unknown at Unknown time  . senna-docusate (SENOKOT-S) 8.6-50 MG tablet Take 2 tablets by mouth daily. 30 tablet 0 Unknown at Unknown time  . sodium chloride (OCEAN) 0.65 % nasal spray Place 1 spray into the nose as needed for congestion. 30 mL 12         Review of Systems   Constitutional: Negative for fever and chills Eyes: Negative for visual disturbances Respiratory: Negative for shortness of breath, dyspnea Cardiovascular: Negative for chest pain or palpitations  Gastrointestinal: Negative for vomiting, diarrhea and constipation.  POSITIVE for abdominal pain (contractions) Genitourinary: Negative for dysuria and urgency Musculoskeletal: Negative for back pain, joint pain, myalgias  Neurological: Negative for dizziness and headaches      Blood pressure 100/60, pulse 70, temperature (!) 97.3 F (36.3 C), temperature source Oral, last menstrual period 07/10/2016, not currently breastfeeding. General appearance: alert, distracted, no distress and somewhat agitated Lungs: clear to auscultation bilaterally Heart: regular rate and rhythm Abdomen: soft, non-tender; bowel sounds normal,.  FF @ U-2 Pelvic:  Lochia minimal.  1st degree perineal lac, hemostatic. Offered repair and pt refused.  Extremities: Homans sign is negative, no sign of DVT DTR's 2+   Prenatal labs:  pending    No results found for this or any previous visit (from the past 24 hour(s)).  Assessment: Summer Cain is a 32 y.o. [id number] , 2 hrs pp s/p unplanned home delivery, stable.  Suspicious for drug use d/t erratic behavior.  EMS personal stated that when they arrived, pt excused herself to the bathroom for a little while and "seemed disoriented" when she came out  Plan: Routine PP  care UDS SW consult PT wants BTL, so get papers signed while in hospital  Summer Cain 04/08/2017, 7:29 AM

## 2017-04-09 LAB — CULTURE, GROUP A STREP (THRC)

## 2017-04-09 LAB — RUBELLA SCREEN: RUBELLA: 1.69 {index} (ref >0.99)

## 2017-04-09 NOTE — Progress Notes (Signed)
Post Partum Day 1 Subjective: no complaints, up ad lib, voiding, tolerating PO and + flatus  Objective: Blood pressure (!) 99/55, pulse 63, temperature 98.6 F (37 C), temperature source Oral, resp. rate 14, height 5' 0.25" (1.53 m), weight 115 lb (52.2 kg), last menstrual period 07/10/2016, SpO2 97 %, unknown if currently breastfeeding.  Physical Exam:  General: alert and no distress Lochia: appropriate Uterine Fundus: firm Incision: NA DVT Evaluation: No evidence of DVT seen on physical exam.  Recent Labs    04/08/17 0802  HGB 11.2*  HCT 32.4*    Assessment/Plan: Plan for discharge tomorrow and Social Work consult  SW consult pending SW is aware and has contacted CPS SW notes barriers to DC at this time UDS that was ordered on admission was not sent a new one has been ordered, but patient has had pain medication while here.    LOS: 1 day   Thressa ShellerHeather Eyan Hagood 04/09/2017, 4:16 AM

## 2017-04-09 NOTE — Progress Notes (Signed)
RN instructed pt to save urine for collection. Pt states she did not have time to urinate in container during her last void because it was an emergency. RN instructed pt to void into the container next time. Will continue to monitor for urine collected.

## 2017-04-09 NOTE — Progress Notes (Signed)
Found mom sleeping with baby/mom hard to arouse    Baby taken to nursery   Baby very restless/mom stated baby can have formula 

## 2017-04-09 NOTE — Clinical Social Work Maternal (Signed)
CLINICAL SOCIAL WORK MATERNAL/CHILD NOTE  Patient Details  Name: Summer Cain MRN: [id number] Date of Birth: 04/22/1985  Date:  04/09/2017  Clinical Social Worker Initiating Note:  Beyonca Wisz Boyd-Gilyard Date/Time: Initiated:  04/08/17/1530     Child's Name:  Meadow Thomas(This is the name MOB is thinking about. )   Biological Parents:  Mother, Father   Need for Interpreter:  None   Reason for Referral:  Late or No Prenatal Care , Current Substance Use/Substance Use During Pregnancy , Behavioral Health Concerns   Address:  [address] [place] [address]    Phone number:  [phone] (home)     Additional phone number: FOB number is [phone]  Household Members/Support Persons (HM/SP):   Household Member/Support Person 1, Household Member/Support Person 2, Household Member/Support Person 3   HM/SP Name Relationship DOB or Age  HM/SP -1 Keving Thomas FOB  11/20/1985  HM/SP -2 Shane Gray son 06/09/05  HM/SP -3 Jameson Thomas  son 11/06/15  HM/SP -4        HM/SP -5        HM/SP -6        HM/SP -7        HM/SP -8          Natural Supports (not living in the home):  (Per MOB, the family has no supports. )   Professional Supports: Case Manager/Social Worker(CPS report was made and a worker will be assigned within 24 hours. )   Employment: Unemployed   Type of Work:     Education:  High school graduate   Homebound arranged:    Financial Resources:  Medicaid   Other Resources:  Food Stamps    Cultural/Religious Considerations Which May Impact Care:  Per MOB's Face Sheet, MOB is Christian.   Strengths:  Ability to meet basic needs , Home prepared for child , Understanding of illness, Pediatrician chosen   Psychotropic Medications:         Pediatrician:    [place] area  Pediatrician List:   [facility]  [place]    [place]    [place]    [place]    [place]       Pediatrician Fax Number:    Risk Factors/Current Problems:  Substance Use , Mental Health Concerns    Cognitive State:  Linear Thinking , Alert    Mood/Affect:  Anxious , Interested , Comfortable    CSW Assessment: CSW met with MOB to complete an assessment for MH hx, NPNC, and SA hx.  When CSW arrived, MOB was bonding with infant as evidence by MOB engaging in skin to skin.  MOB was polite, appeared forthcoming, and receptive to meeting with CSW.    CSW asked about MOB MH hx and MOB openly shared that MOB has a dx of bipolar disorder and anxiety.  MOB reported that she was dx as teen and is currently not taking any medications.  Per MOB, MOB manages her symptoms by taking walks, painting, and playing various musical instruments. CSW offered MOB resources for outpatient counseling and MOB declined. CSW provided education regarding the baby blues period vs. perinatal mood disorders, discussed treatment and gave resources for mental health follow up if concerns arise.  CSW recommends self-evaluation during the postpartum time period using the New Mom Checklist from Postpartum Progress and encouraged MOB to contact a medical professional if symptoms are noted at any time.  CSW assessed for safety and MOB denied   SI, HI, and DV.  MOB did not present with any acute MH symptoms and appeared to have insight and awareness about her dx.   CSW asked about MOB not receiving PNC and MOB communicated, "I did not plan to have this baby.  For months I contemplated on an abortion and recently an adoption."  CSW inquired to see if MOB was still interested in doing an adoption plan and MOB responded, "No." CSW shared the hospital policy regarding [place] and MOB was understanding.  CSW made MOB aware of 2 screens for infants and MOB is aware that CSW will monitor screens and will report the results to [place]. MOB openly shared that MOB used marijuana (last use was January/February 2019), Percocets  (last use was last week; MOB does not have an active Rx), and Xanax (last use was 2 weeks ago; MOB does not have an active Rx). CSW thanked MOB for her honesty and made MOB that CSW was making a report to CPS; MOB was understanding and shared that she had a CPS report made from the hospital with her 2nd child for substance exposure. Per MOB, MOB's case closed after 2 months (CSW verified with CPS worker, Wendall Stade, that MOB does not currently have an open case.  CSW also made MOB aware that medical team will monitor infant for the next 3-5 days for withdrawal signs and other medical problems bc MOB did not have any PNC; MOB was understanding and communicated that she will remain in the hospital with infant until infant is ready for discharge. CSW offered MOB resources for SA and MOB declined.   Per MOB, MOB has all essential for infant and feels prepared to parent. MOB also stated the MOB's oldest 2 children are home with FOB.  CSW provided review of Sudden Infant Death Syndrome (SIDS) precautions.    There are barriers to d/c.  CSW Plan/Description:  Sudden Infant Death Syndrome (SIDS) Education, Perinatal Mood and Anxiety Disorder (PMADs) Education, [place], Child Protective Service Report , CSW Will Continue to Monitor Umbilical Cord Tissue Drug Screen Results and Make Report if Warranted, CSW Awaiting CPS Disposition Plan   Summer Cain, MSW, LCSW Clinical Social Work [phone]   Summer Nanas, LCSW 04/09/2017, 8:35 AM

## 2017-04-09 NOTE — Progress Notes (Deleted)
Found mom sleeping with baby/mom hard to arouse    Baby taken to nursery   Baby very restless/mom stated baby can have formula

## 2017-04-09 NOTE — Lactation Note (Addendum)
This note was copied from a baby's chart. Lactation Consultation Note  Patient Name: Girl Rosine DoorHeather Altier ZOXWR'UToday's Date: 04/09/2017 Reason for consult: Initial assessment;Term;Infant < 6lbs No PNC, history of Substance abuse (THC, multiple drug use)  Visited with P2 Mom of ?term infant at 5338 hrs old.  Baby born at 5 lbs 3 oz, weight loss of 4.6% down to <5 lbs.   Mom delivered baby at home, with no PNC.    Mom resting on her side, with baby beside her.  Offered to assist with feeding.  Undressed baby to provide STS.  Mom has small breasts, with small areola (compressible), and erect nipples.  Reviewed hand expression, and transitional milk easily expressed.  Baby able to attain a deep areolar latch.  Occasional swallowing noted.  Taught Mom how to do alternate breast compression to increase milk transfer.    Baby has gotten 3 formula feedings of 10, 22 and 40 ml.  Mom states that was because baby went to nursery so she could visit her children.    Late Preterm Infant guidelines given to Mom.  Informed Mom of importance of limiting breastfeeding to 30 mins, avoiding overstimulation, and offering 10-20 ml of EBM+/formula by bottle after breast feeding.  Mom doesn't seem to  Understand that baby is very small, and will need assistance (pumping and supplementing) with breastfeeding for a few weeks until she gains weight.  Plan- 1- Every 3 hrs or sooner, breastfeed STS, using breast compression to increase swallowing. Limit breastfeeding to 30 mins. 2- offer supplement of 10-20 ml EBM+/formula by bottle after breast, increasing volume each day as tolerated. 3- pump both breasts 15 mins on initiation setting. 4- Keep baby STS as much as possible (Plan written on dry erase board, and explained to Mom)  Explained about washing pump parts, and drying parts in basin away from sink.  Mom has a pacifier for baby due to probably withdrawal symptoms.  Encouraged Mom to keep baby STS to help with this.  Lights  dimmed. Baby very alert and gazing at [facility]Mom while she breastfeeds.   Lactation brochure given to Mom. Informed her of IP and OP lactation services.  Encouraged her to call prn   Interventions Interventions: Breast feeding basics reviewed;Assisted with latch;Skin to skin;Breast massage;Hand express;Breast compression;Adjust position;Support pillows;Position options;Expressed milk;DEBP  Lactation Tools Discussed/Used Tools: Pump;Bottle Breast pump type: Double-Electric Breast Pump WIC Program: No Pump Review: Setup, frequency, and cleaning;Milk Storage Initiated by:: RN Date initiated:: 04/08/17   Consult Status Consult Status: Follow-up Date: 04/10/17 Follow-up type: In-patient    Judee ClaraSmith, Arnold Depinto E 04/09/2017, 8:24 PM

## 2017-04-10 MED ORDER — IBUPROFEN 600 MG PO TABS: 600.0000 mg | ORAL_TABLET | Freq: Four times a day (QID) | ORAL | 0 refills | Status: DC

## 2017-04-10 NOTE — Progress Notes (Signed)
CSW spoke with CPS worker, Bethann Berkshireasey Owens regarding discharge safety plan for family.  CPS informed CSW that there are barriers to infant's discharge to MOB at this time. CPS stated that CPS will follow-up with CSW on Monday (04/11/17) regarding discharge plan. CSW also updated MOB regarding MOB continuing to co sleep with infant after being educated about SIDS and safe sleep at least twice by staff.   There are barriers to infant's discharge to MOB.   ]Terea Neubauer Boyd-Gilyard, MSW, LCSW Clinical Social Work [phone]

## 2017-04-10 NOTE — Discharge Instructions (Signed)
Vaginal Delivery, Care After °Refer to this sheet in the next few weeks. These instructions provide you with information about caring for yourself after vaginal delivery. Your health care provider may also give you more specific instructions. Your treatment has been planned according to current medical practices, but problems sometimes occur. Call your health care provider if you have any problems or questions. °What can I expect after the procedure? °After vaginal delivery, it is common to have: °· Some bleeding from your vagina. °· Soreness in your abdomen, your vagina, and the area of skin between your vaginal opening and your anus (perineum). °· Pelvic cramps. °· Fatigue. ° °Follow these instructions at home: °Medicines °· Take over-the-counter and prescription medicines only as told by your health care provider. °· If you were prescribed an antibiotic medicine, take it as told by your health care provider. Do not stop taking the antibiotic until it is finished. °Driving ° °· Do not drive or operate heavy machinery while taking prescription pain medicine. °· Do not drive for 24 hours if you received a sedative. °Lifestyle °· Do not drink alcohol. This is especially important if you are breastfeeding or taking medicine to relieve pain. °· Do not use tobacco products, including cigarettes, chewing tobacco, or e-cigarettes. If you need help quitting, ask your health care provider. °Eating and drinking °· Drink at least 8 eight-ounce glasses of water every day unless you are told not to by your health care provider. If you choose to breastfeed your baby, you may need to drink more water than this. °· Eat high-fiber foods every day. These foods may help prevent or relieve constipation. High-fiber foods include: °? Whole grain cereals and breads. °? Brown rice. °? Beans. °? Fresh fruits and vegetables. °Activity °· Return to your normal activities as told by your health care provider. Ask your health care provider  what activities are safe for you. °· Rest as much as possible. Try to rest or take a nap when your baby is sleeping. °· Do not lift anything that is heavier than your baby or 10 lb (4.5 kg) until your health care provider says that it is safe. °· Talk with your health care provider about when you can engage in sexual activity. This may depend on your: °? Risk of infection. °? Rate of healing. °? Comfort and desire to engage in sexual activity. °Vaginal Care °· If you have an episiotomy or a vaginal tear, check the area every day for signs of infection. Check for: °? More redness, swelling, or pain. °? More fluid or blood. °? Warmth. °? Pus or a bad smell. °· Do not use tampons or douches until your health care provider says this is safe. °· Watch for any blood clots that may pass from your vagina. These may look like clumps of dark red, brown, or black discharge. °General instructions °· Keep your perineum clean and dry as told by your health care provider. °· Wear loose, comfortable clothing. °· Wipe from front to back when you use the toilet. °· Ask your health care provider if you can shower or take a bath. If you had an episiotomy or a perineal tear during labor and delivery, your health care provider may tell you not to take baths for a certain length of time. °· Wear a bra that supports your breasts and fits you well. °· If possible, have someone help you with household activities and help care for your baby for at least a few days after   you leave the hospital. °· Keep all follow-up visits for you and your baby as told by your health care provider. This is important. °Contact a health care provider if: °· You have: °? Vaginal discharge that has a bad smell. °? Difficulty urinating. °? Pain when urinating. °? A sudden increase or decrease in the frequency of your bowel movements. °? More redness, swelling, or pain around your episiotomy or vaginal tear. °? More fluid or blood coming from your episiotomy or  vaginal tear. °? Pus or a bad smell coming from your episiotomy or vaginal tear. °? A fever. °? A rash. °? Little or no interest in activities you used to enjoy. °? Questions about caring for yourself or your baby. °· Your episiotomy or vaginal tear feels warm to the touch. °· Your episiotomy or vaginal tear is separating or does not appear to be healing. °· Your breasts are painful, hard, or turn red. °· You feel unusually sad or worried. °· You feel nauseous or you vomit. °· You pass large blood clots from your vagina. If you pass a blood clot from your vagina, save it to show to your health care provider. Do not flush blood clots down the toilet without having your health care provider look at them. °· You urinate more than usual. °· You are dizzy or light-headed. °· You have not breastfed at all and you have not had a menstrual period for 12 weeks after delivery. °· You have stopped breastfeeding and you have not had a menstrual period for 12 weeks after you stopped breastfeeding. °Get help right away if: °· You have: °? Pain that does not go away or does not get better with medicine. °? Chest pain. °? Difficulty breathing. °? Blurred vision or spots in your vision. °? Thoughts about hurting yourself or your baby. °· You develop pain in your abdomen or in one of your legs. °· You develop a severe headache. °· You faint. °· You bleed from your vagina so much that you fill two sanitary pads in one hour. °This information is not intended to replace advice given to you by your health care provider. Make sure you discuss any questions you have with your health care provider. °Document Released: 01/02/2000 Document Revised: 06/18/2015 Document Reviewed: 01/19/2015 °Elsevier Interactive Patient Education © 2018 Elsevier Inc. ° °

## 2017-04-10 NOTE — Discharge Summary (Addendum)
OB Discharge Summary     Patient Name: Summer DoorHeather Ivanov DOB: 1985/03/15 MRN: [id number]  Date of admission: 04/08/2017 Delivering MD:     Date of discharge: 04/10/2017  Admitting diagnosis: DELIVERED AT HOME Intrauterine pregnancy: Unknown     Secondary diagnosis:  Active Problems:   Precipitate labor, with delivery  Additional problems:  No PNC Marijuana use during pregnancy H/o previous PTD and prior IUGR     Discharge diagnosis: Term Pregnancy Delivered at home                                                                                          Post partum procedures: None  Augmentation: None  Complications: None  Hospital course:  Patient arrived to hospital after delivery of viable infant, 5lbs 4oz, and placenta at home in the bathtub around 0535. Patient noted to have 1st degree perineal lac but was hemostatic and patient declined repair. Postpartum course uncomplicated but notable for CPS involvement.   Physical exam  Vitals:   04/08/17 1530 04/09/17 0607 04/09/17 1847 04/10/17 0525  BP: (!) 99/55 96/60 (!) 99/58 92/80  Pulse: 63 76 68 75  Resp: 14 18 20 20   Temp: 98.6 F (37 C) 98.5 F (36.9 C) 98.1 F (36.7 C) 97.8 F (36.6 C)  TempSrc: Oral Oral  Oral  SpO2: 97% 98%    Weight:      Height:       General: alert, cooperative and no distress Lochia: appropriate Uterine Fundus: firm Incision: N/A DVT Evaluation: No evidence of DVT seen on physical exam. Labs: Lab Results  Component Value Date   WBC 13.7 (H) 04/08/2017   HGB 11.2 (L) 04/08/2017   HCT 32.4 (L) 04/08/2017   MCV 87.1 04/08/2017   PLT 159 04/08/2017   No flowsheet data found.  Discharge instruction: per After Visit Summary and "Baby and Me Booklet".  After visit meds:  Allergies as of 04/10/2017      Reactions   Latex Swelling      Medication List    STOP taking these medications   oxyCODONE-acetaminophen 10-325 MG tablet Commonly known as:  PERCOCET     TAKE these  medications   albuterol 108 (90 Base) MCG/ACT inhaler Commonly known as:  PROVENTIL HFA;VENTOLIN HFA Inhale 2 puffs into the lungs every 4 (four) hours as needed for wheezing or shortness of breath.   ALPRAZolam 1 MG tablet Commonly known as:  XANAX Take 1 mg by mouth at bedtime as needed for anxiety.   ibuprofen 600 MG tablet Commonly known as:  ADVIL,MOTRIN Take 1 tablet (600 mg total) by mouth every 6 (six) hours.   prenatal multivitamin Tabs tablet Take 1 tablet by mouth at bedtime.       Diet: routine diet  Activity: Advance as tolerated. Pelvic rest for 6 weeks.   Outpatient follow up:4 weeks, PP BTL Follow up Appt: Future Appointments  Date Time Provider Department Center  05/12/2017  8:35 AM Marble BingPickens, Charlie, MD [facility]   Follow up Visit:No follow-ups on file.  Postpartum contraception: outpatient Tubal Ligation  Newborn Data: Live born female  Birth Weight: 5  lb 3 oz (2353 g) APGAR: ,   Newborn Delivery   Birth date/time:  04/08/2017 05:35:00 Delivery type:  Vaginal, Spontaneous     Baby Feeding: Breast Disposition:rooming in

## 2017-04-11 ENCOUNTER — Ambulatory Visit: Payer: Self-pay

## 2017-04-11 NOTE — Lactation Note (Addendum)
This note was copied from a baby's chart. Lactation Consultation Note  Patient Name: Girl Herbert SetaHeather Manthei Today's Date: 04/11/2017   Despite infant's negative UDS, I shared my concerns with Irving BurtonEmily, RN about staff supporting pumping in a Mom who had a +cocaine history with her last pregnancy & received no PNC, etc with this pregnancy.   Lurline HareRichey, Nasteho Glantz [facility] 04/11/2017, 11:09 PM

## 2017-05-12 ENCOUNTER — Ambulatory Visit: Payer: Self-pay | Admitting: Obstetrics and Gynecology

## 2017-05-12 ENCOUNTER — Encounter: Payer: Self-pay | Admitting: Obstetrics and Gynecology

## 2017-05-12 NOTE — Progress Notes (Signed)
Patient did not keep postpartum appointment for 05/12/2017.  Summer Cain, Jr MD Attending Center for [facility](Faculty Practice)

## 2018-01-18 NOTE — L&D Delivery Note (Signed)
Patient arrived complete and pushing. SVD of viable female infant in MAU over intact perineum. Infant delivered to mom's abdomen. Delayed cord clamping x 1 minute. Cord clamped x 2, cut. Spontaneous cry heard. Weight and Apgars pending. Cord blood obtained. Placenta delivered spontaneously and intact. EBL: 150 cc Anesthesia: none

## 2018-04-13 ENCOUNTER — Ambulatory Visit (HOSPITAL_COMMUNITY)
Admission: EM | Admit: 2018-04-13 | Discharge: 2018-04-13 | Disposition: A | Discharge status: Home / Self Care | Payer: Medicaid Other

## 2018-05-22 ENCOUNTER — Encounter (HOSPITAL_COMMUNITY): Payer: Self-pay | Admitting: *Deleted

## 2018-05-22 ENCOUNTER — Inpatient Hospital Stay (HOSPITAL_COMMUNITY)
Admission: AD | Admit: 2018-05-22 | Discharge: 2018-05-24 | DRG: 805 | Disposition: A | Discharge status: Home / Self Care | Payer: Medicaid Other | Attending: Family Medicine | Admitting: Family Medicine

## 2018-05-22 DIAGNOSIS — O99334 Smoking (tobacco) complicating childbirth: Secondary | ICD-10-CM | POA: Diagnosis present

## 2018-05-22 DIAGNOSIS — Z3A35 35 weeks gestation of pregnancy: Secondary | ICD-10-CM | POA: Diagnosis not present

## 2018-05-22 DIAGNOSIS — O26893 Other specified pregnancy related conditions, third trimester: Secondary | ICD-10-CM | POA: Diagnosis present

## 2018-05-22 DIAGNOSIS — F17210 Nicotine dependence, cigarettes, uncomplicated: Secondary | ICD-10-CM | POA: Diagnosis present

## 2018-05-22 DIAGNOSIS — O0930 Supervision of pregnancy with insufficient antenatal care, unspecified trimester: Secondary | ICD-10-CM

## 2018-05-22 LAB — DIFFERENTIAL
Abs Immature Granulocytes: 0.04 10*3/uL (ref 0.00–0.07)
Basophils Absolute: 0 10*3/uL (ref 0.0–0.1)
Basophils Relative: 0 %
Eosinophils Absolute: 0 10*3/uL (ref 0.0–0.5)
Eosinophils Relative: 0 %
Immature Granulocytes: 1 %
Lymphocytes Relative: 11 %
Lymphs Abs: 0.9 10*3/uL (ref 0.7–4.0)
Monocytes Absolute: 0.5 10*3/uL (ref 0.1–1.0)
Monocytes Relative: 6 %
Neutro Abs: 6.7 10*3/uL (ref 1.7–7.7)
Neutrophils Relative %: 82 %

## 2018-05-22 LAB — CBC
HCT: 32.6 % — ABNORMAL LOW (ref 36.0–46.0)
Hemoglobin: 10.9 g/dL — ABNORMAL LOW (ref 12.0–15.0)
MCH: 29.4 pg (ref 26.0–34.0)
MCHC: 33.4 g/dL (ref 30.0–36.0)
MCV: 87.9 fL (ref 80.0–100.0)
Platelets: 145 10*3/uL — ABNORMAL LOW (ref 150–400)
RBC: 3.71 MIL/uL — ABNORMAL LOW (ref 3.87–5.11)
RDW: 12.9 % (ref 11.5–15.5)
WBC: 8.2 10*3/uL (ref 4.0–10.5)
nRBC: 0 % (ref 0.0–0.2)

## 2018-05-22 LAB — RPR: RPR Ser Ql: NONREACTIVE

## 2018-05-22 LAB — TYPE AND SCREEN
ABO/RH(D): O POS
Antibody Screen: NEGATIVE

## 2018-05-22 LAB — HEPATITIS B SURFACE ANTIGEN: Hepatitis B Surface Ag: NEGATIVE

## 2018-05-22 LAB — HIV ANTIBODY (ROUTINE TESTING W REFLEX): HIV Screen 4th Generation wRfx: NONREACTIVE

## 2018-05-22 MED ORDER — DIBUCAINE (PERIANAL) 1 % EX OINT: 1.0000 "application " | TOPICAL_OINTMENT | CUTANEOUS | Status: DC | PRN

## 2018-05-22 MED ORDER — SIMETHICONE 80 MG PO CHEW: 80.0000 mg | CHEWABLE_TABLET | ORAL | Status: DC | PRN

## 2018-05-22 MED ORDER — BENZOCAINE-MENTHOL 20-0.5 % EX AERO
1.0000 "application " | INHALATION_SPRAY | CUTANEOUS | Status: DC | PRN
  Filled 2018-05-22: qty 56

## 2018-05-22 MED ORDER — ACETAMINOPHEN 325 MG PO TABS: 650.0000 mg | ORAL_TABLET | ORAL | 1 refills | Status: DC | PRN

## 2018-05-22 MED ORDER — SENNOSIDES-DOCUSATE SODIUM 8.6-50 MG PO TABS
2.0000 | ORAL_TABLET | ORAL | Status: DC
  Administered 2018-05-22 – 2018-05-23 (×2): 2 via ORAL
  Filled 2018-05-22 (×2): qty 2

## 2018-05-22 MED ORDER — ACETAMINOPHEN 325 MG PO TABS: 650.0000 mg | ORAL_TABLET | ORAL | Status: DC | PRN

## 2018-05-22 MED ORDER — OXYTOCIN 40 UNITS IN NORMAL SALINE INFUSION - SIMPLE MED: 2.5000 [IU]/h | INTRAVENOUS | Status: DC | PRN

## 2018-05-22 MED ORDER — DIPHENHYDRAMINE HCL 25 MG PO CAPS
25.0000 mg | ORAL_CAPSULE | Freq: Four times a day (QID) | ORAL | Status: DC | PRN
  Administered 2018-05-22 – 2018-05-23 (×2): 25 mg via ORAL
  Filled 2018-05-22 (×2): qty 1

## 2018-05-22 MED ORDER — IBUPROFEN 600 MG PO TABS
600.0000 mg | ORAL_TABLET | Freq: Four times a day (QID) | ORAL | Status: DC
  Administered 2018-05-22 – 2018-05-24 (×10): 600 mg via ORAL
  Filled 2018-05-22 (×10): qty 1

## 2018-05-22 MED ORDER — ZOLPIDEM TARTRATE 5 MG PO TABS: 5.0000 mg | ORAL_TABLET | Freq: Every evening | ORAL | Status: DC | PRN

## 2018-05-22 MED ORDER — MEDROXYPROGESTERONE ACETATE 150 MG/ML IM SUSP
150.0000 mg | Freq: Once | INTRAMUSCULAR | Status: AC
  Administered 2018-05-23: 150 mg via INTRAMUSCULAR
  Filled 2018-05-22: qty 1

## 2018-05-22 MED ORDER — WITCH HAZEL-GLYCERIN EX PADS: 1.0000 "application " | MEDICATED_PAD | CUTANEOUS | Status: DC | PRN

## 2018-05-22 MED ORDER — ONDANSETRON HCL 4 MG/2ML IJ SOLN: 4.0000 mg | INTRAMUSCULAR | Status: DC | PRN

## 2018-05-22 MED ORDER — TETANUS-DIPHTH-ACELL PERTUSSIS 5-2.5-18.5 LF-MCG/0.5 IM SUSP: 0.5000 mL | Freq: Once | INTRAMUSCULAR | Status: DC

## 2018-05-22 MED ORDER — COCONUT OIL OIL: 1.0000 "application " | TOPICAL_OIL | Status: DC | PRN

## 2018-05-22 MED ORDER — OXYTOCIN 10 UNIT/ML IJ SOLN
INTRAMUSCULAR | Status: AC
  Administered 2018-05-22: 10 [IU]
  Filled 2018-05-22: qty 1

## 2018-05-22 MED ORDER — IBUPROFEN 600 MG PO TABS: 600.0000 mg | ORAL_TABLET | Freq: Four times a day (QID) | ORAL | 0 refills | Status: DC

## 2018-05-22 MED ORDER — PRENATAL MULTIVITAMIN CH
1.0000 | ORAL_TABLET | Freq: Every day | ORAL | Status: DC
  Administered 2018-05-22 – 2018-05-24 (×3): 1 via ORAL
  Filled 2018-05-22 (×3): qty 1

## 2018-05-22 MED ORDER — TETANUS-DIPHTH-ACELL PERTUSSIS 5-2.5-18.5 LF-MCG/0.5 IM SUSP
0.5000 mL | Freq: Once | INTRAMUSCULAR | Status: AC
  Administered 2018-05-24: 0.5 mL via INTRAMUSCULAR
  Filled 2018-05-22: qty 0.5

## 2018-05-22 MED ORDER — ONDANSETRON HCL 4 MG PO TABS: 4.0000 mg | ORAL_TABLET | ORAL | Status: DC | PRN

## 2018-05-22 MED ORDER — MEASLES, MUMPS & RUBELLA VAC IJ SOLR: 0.5000 mL | Freq: Once | INTRAMUSCULAR | Status: DC

## 2018-05-22 NOTE — Progress Notes (Signed)
RN has tried multiple times today to get UDS on pt. Per pt, she has not been able to pee, except for when she coughs or when she had a bowel movement. Pt does not feel like her bladder is full, despite reporting drinking coffee, iced tea, hot chocolate, lemonade, and a couple cups of water today. RN bladder scanned pt to find 39 mL in bladder. Fundus not deviated, bleeding WNL. Encouraged pt to drink water. Will alert MD for what to do for UDS.

## 2018-05-22 NOTE — Lactation Note (Signed)
This note was copied from a baby's chart. Lactation Consultation Note Baby 4 hrs old est. Gestation 35 wks.  Baby sleepy, not interested in BF or bottle feeding.  Suckled on bottle once, milk running out of mouth.  Baby color appeared to be slightly bruised. Mom holding baby STS under her gown. Mom took baby out from her gown to wake baby up.  LC mentioned baby needs to stay covered some and hat on d/t LBW.   Mom stated she BF her daughter 74 months, she is 1 yr old now. Mom denies taking anything drugs while BF.  LPI information sheet given and reviewed. Reviewed supplementing after BF. Encouraged STS, strict I&O, hand expression, supply and demand. Mom has everted nipples, expressed colostrum. Has DEBP, RN has set up at bedside. Encouraged mom to call for assistance or questions.  LC is concerned of baby's Gestational age and LBW and no interest in feeding from breast or bottle. LC concerned of hypoglycemia. Baby appears to be very sleepy. LC understands normal for age. Baby needs to be watched closely for I&O.  Lactation left at bedside.   Patient Name: Summer Cain PTELM'R Date: 05/22/2018 Reason for consult: Initial assessment;Infant < 6lbs;Late-preterm 34-36.6wks   Maternal Data Has patient been taught Hand Expression?: Yes Does the patient have breastfeeding experience prior to this delivery?: Yes  Feeding Feeding Type: Formula Nipple Type: Slow - flow  LATCH Score       Type of Nipple: Everted at rest and after stimulation  Comfort (Breast/Nipple): Soft / non-tender        Interventions Interventions: Breast compression;Breast feeding basics reviewed;DEBP  Lactation Tools Discussed/Used Tools: Pump Breast pump type: Double-Electric Breast Pump WIC Program: Yes Pump Review: Setup, frequency, and cleaning;Milk Storage Initiated by:: Landry Dyke, RN Date initiated:: 05/22/18   Consult Status Consult Status: Follow-up Date: 05/23/18 Follow-up type:  In-patient    Charyl Dancer 05/22/2018, 5:26 AM

## 2018-05-22 NOTE — Progress Notes (Signed)
RN notified CNM Luna Kitchens to determine direction for obtaining UDS on pt. CNM unsure of all of pt's history, and she wants to speak with Dr. Marice Potter and follow up with RN afterwards. As of now, have pt keep trying in clean urine hat.

## 2018-05-22 NOTE — Progress Notes (Signed)
Pt continues to be very drowsy throughout day. Now, pt requesting benadryl due to allergy complaints. RN administered per pt request and educated pt again on safe sleep for infant and the need to take th infant to the nursery if MOB becomes too tired. MOB was upset about the idea of her infant being away from her, stating she wouldn't be ab;e to sleep if he was away because of "how she is". RN educated pt and she verbalized understanding.

## 2018-05-22 NOTE — Progress Notes (Signed)
Nurse Tech entered pt room and MOB was asleep in bed, while holding the infant. NT reiterated the importance of safe sleep practices and pt understood. Infant was taken to the nursery so that MOB could take a nap.

## 2018-05-22 NOTE — Discharge Summary (Signed)
Postpartum Discharge Summary  Patient Name: Summer DoorHeather Cain DOB: September 11, 1985 MRN: [id number]  Date of admission: 05/22/2018 Delivering Provider: Reva BoresPRATT, TANYA S   Date of discharge: 05/24/2018  Admitting diagnosis: 36wks water broke Intrauterine pregnancy: Unknown     Secondary diagnosis:  Active Problems:   Preterm newborn infant with birth weight of 2,000 to 2,499 grams and 35 completed weeks of gestation   Precipitous delivery   No prenatal care in current pregnancy  Discharge diagnosis: Preterm Pregnancy Delivered                           Postpartum procedures:none Augmentation: NONE Complications: None  Hospital course:  Onset of Labor With Vaginal Delivery     33 y.o. yo [id number] at Unknown was admitted in Active Labor on 05/22/2018. Patient had an uncomplicated labor course as follows:  Membrane Rupture Time/Date: 12:27 AM ,05/22/2018   Intrapartum Procedures: Episiotomy: None [1]                                         Lacerations:  None [1]  Patient had a delivery of a Viable infant. 05/22/2018  Information for the patient's newborn:  Gaetano, Graceann CongressBoy Karma [[id number]][[id number]]  Delivery Method: Vag-Spont   Patient had an uncomplicated postpartum course. A social work consult was placed for lack of prenatal care. She is ambulating, tolerating a regular diet, passing flatus, and urinating well. Patient is discharged home in stable condition on 05/24/18.  Magnesium Sulfate recieved: No BMZ received: No  Physical exam  Vitals:   05/23/18 1508 05/23/18 2327 05/24/18 0626 05/24/18 0825  BP: 97/80 103/72 104/64   Pulse: 68 72 87   Resp: 18 18 18    Temp: 98.1 F (36.7 C) 98.3 F (36.8 C) 98.1 F (36.7 C) 97.9 F (36.6 C)  TempSrc: Oral Oral Oral Oral  SpO2: 97%  97%    General: alert, well-appearing, NAD Lochia: appropriate Uterine Fundus: firm Incision: N/A DVT Evaluation: No significant calf/ankle edema  Labs: Lab Results  Component Value Date   WBC 8.2 05/22/2018   HGB 10.9  (L) 05/22/2018   HCT 32.6 (L) 05/22/2018   MCV 87.9 05/22/2018   PLT 145 (L) 05/22/2018   No flowsheet data found.  Discharge instruction: per After Visit Summary and "Baby and Me Booklet".  After visit meds:  Allergies as of 05/24/2018      Reactions   Latex Swelling      Medication List    TAKE these medications   acetaminophen 325 MG tablet Commonly known as:  Tylenol Take 2 tablets (650 mg total) by mouth every 4 (four) hours as needed (for pain scale < 4).   ibuprofen 800 MG tablet Commonly known as:  ADVIL Take 1 tablet (800 mg total) by mouth 3 (three) times daily. What changed:    medication strength  how much to take  when to take this      Diet: routine diet Activity: Advance as tolerated. Pelvic rest for 6 weeks.   Outpatient follow up:4 weeks Follow up Appt:No future appointments. Follow up Visit: Follow-up Information    [facility] Schedule an appointment as soon as possible for a visit.   Specialty:  Obstetrics and Gynecology Why:  You should schedule a postpartum follow-up visit in 4-6 weeks.  Contact information: [address], [address] [id number] mc [facility] [address] [phone]        Please schedule this patient for Postpartum visit in: 4 weeks with the following provider: Any provider For C/S patients schedule nurse incision check in weeks 2 weeks: no Low risk pregnancy complicated by: NO PRENATAL CARE Delivery mode:  SVD Anticipated Birth Control:  other/unsure PP Procedures needed: NONE  Schedule Integrated BH visit: no  Newborn Data: Live born female  Birth Weight: 4 lb 9.9 oz (2095 g) APGAR: 8, 9  Newborn Delivery   Birth date/time:  05/22/2018 01:23:00 Delivery type:  Vaginal, Spontaneous    Baby Feeding: Breast and Bottle Disposition: Pending SW, CPS   05/24/2018 Tamera Stands, DO
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
## 2018-05-22 NOTE — H&P (Signed)
  Summer Cain is an 33 y.o. [phone] Unknown female.   Chief Complaint: labor, ROM HPI: Patient is here without PNC. 35 wks by LMP. Has h/o similar story last year. She arrived complete and pushing. No u/s done. Appears premature by fundal height.  Past Medical History:  Diagnosis Date  . Pregnant   . Scoliosis     Past Surgical History:  Procedure Laterality Date  . KIDNEY STONE SURGERY      Family History  Problem Relation Age of Onset  . Hypertension Mother   . Depression Mother   . Hyperlipidemia Father   . Diabetes Paternal Grandmother    Social History:  reports that she has been smoking cigarettes. She has never used smokeless tobacco. She reports current alcohol use. She reports current drug use. Drug: Marijuana.    Allergies  Allergen Reactions  . Latex Swelling    Medications Prior to Admission  Medication Sig Dispense Refill  . ALPRAZolam (XANAX) 1 MG tablet Take 1 mg by mouth at bedtime as needed for anxiety.    Marland Kitchen ibuprofen (ADVIL,MOTRIN) 600 MG tablet Take 1 tablet (600 mg total) by mouth every 6 (six) hours. 30 tablet 0  . Prenatal Vit-Fe Fumarate-FA (PRENATAL MULTIVITAMIN) TABS tablet Take 1 tablet by mouth at bedtime.     Marland Kitchen albuterol (PROVENTIL HFA;VENTOLIN HFA) 108 (90 Base) MCG/ACT inhaler Inhale 2 puffs into the lungs every 4 (four) hours as needed for wheezing or shortness of breath. 1 Inhaler 0     Pertinent items are noted in HPI.  unknown if currently breastfeeding. General appearance: alert, cooperative and appears stated age Head: Normocephalic, without obvious abnormality, atraumatic Neck: supple, symmetrical, trachea midline Lungs: normal effort Heart: regular rate and rhythm Abdomen: gravid, non-tender, bloody vaginal discharge noted Extremities: Homans sign is negative, no sign of DVT Skin: Skin color, texture, turgor normal. No rashes or lesions Neurologic: Grossly normal   Lab Results  Component Value Date   WBC 13.7 (H)  04/08/2017   HGB 11.2 (L) 04/08/2017   HCT 32.4 (L) 04/08/2017   MCV 87.1 04/08/2017   PLT 159 04/08/2017         ABO, Rh:    Antibody:    Rubella:    RPR:    HBsAg:    HIV:    GBS:       Assessment/Plan Active Problems:   Preterm newborn infant with birth weight of 2,000 to 2,499 grams and 35 completed weeks of gestation  For routine pp care SW to see pt. Labs + UDS  Reva Bores 05/22/2018, 1:38 AM

## 2018-05-22 NOTE — Progress Notes (Signed)
Pt seemed a little drowsy when RN did her assessment around 2030. Reminded pt that I needed to collect urine and informed her to call out after she pees in the hat. Pt agreed. About 30 minutes later the nurse tech went in to ask about giving the baby a bath. The tech came out of the room and reported to the RN that pt was very drowsy and kept nodding off in the middle of sentences. Patient has not been given any medications during this shift. Baby was in the crib at this time. The tech and RN will monitor closely and make sure safe sleep is maintained for baby.

## 2018-05-23 LAB — RAPID URINE DRUG SCREEN, HOSP PERFORMED
Amphetamines: NOT DETECTED
Barbiturates: NOT DETECTED
Benzodiazepines: NOT DETECTED
Cocaine: NOT DETECTED
Opiates: NOT DETECTED
Tetrahydrocannabinol: NOT DETECTED

## 2018-05-23 LAB — ABO/RH: ABO/RH(D): O POS

## 2018-05-23 LAB — RUBELLA SCREEN: Rubella: 2.32 index (ref >0.99)
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
## 2018-05-23 NOTE — Progress Notes (Signed)
Still unable to collect the UDS. The pt states that she "pees when she coughs" but no urine has been collected in the hat. Pt has had multiple cups of tea, a can of sprite, and some water throughout the night. RN noticed that the pts pad that had been thrown in the trash had a blue line down the middle indicating that there was urine on the pad.

## 2018-05-23 NOTE — Progress Notes (Signed)
RN "accidentally" uncovered pt's vape pen in room when moving bedside table (it fell from the space under the table ledge). RN reminded pt that she cannot vape in her hospital room as it is against policy. Pt states that she knows this already and is not vaping. RN also noticed that the pt has a coin purse in her room that she is carrying with her everywhere- to the bathroom, sleeping with it, to the sink, etc. Pt states she was excited to find money in it.

## 2018-05-23 NOTE — Lactation Note (Signed)
This note was copied from a baby's chart. Lactation Consultation Note  Patient Name: Summer Cain [id number] Date: 05/23/2018 Reason for consult: Follow-up assessment;Late-preterm 34-36.6wks;Infant < 6lbs Mom is putting baby to breast with cues and then supplementing with expressed milk/formula.  She post pumps and obtains about 20 mls of transitional milk.  RN reports that baby has some difficulty with bottle.  Speech consult has been ordered.  Mom is currently latching baby to breast using cradle hold.  Mom has large nipple for baby's small mouth so obtaining good depth is challenging.  Baby has a burst of sucks and then slips off and needs relatched.  Mom understands plan and will call for assist prn.  Maternal Data    Feeding Feeding Type: Breast Fed Nipple Type: Slow - flow  LATCH Score Latch: Repeated attempts needed to sustain latch, nipple held in mouth throughout feeding, stimulation needed to elicit sucking reflex.  Audible Swallowing: None  Type of Nipple: Everted at rest and after stimulation  Comfort (Breast/Nipple): Soft / non-tender  Hold (Positioning): No assistance needed to correctly position infant at breast.  LATCH Score: 7  Interventions    Lactation Tools Discussed/Used     Consult Status Consult Status: Follow-up Date: 05/24/18 Follow-up type: In-patient    Huston Foley 05/23/2018, 2:20 PM

## 2018-05-23 NOTE — Progress Notes (Signed)
Post Partum Day 1 Subjective: Doing well, having some mild discomfort in abdomen. Really tired.   Objective: Blood pressure 93/68, pulse (!) 55, temperature 98.2 F (36.8 C), temperature source Oral, resp. rate 16, SpO2 97 %, unknown if currently breastfeeding.  Physical Exam:  General: alert, NAD Lochia: appropriate Uterine Fundus: firm Incision: n/a DVT Evaluation: No significant calf/ankle edema.  Recent Labs    05/22/18 0205  HGB 10.9*  HCT 32.6*    Assessment/Plan: Plan for discharge tomorrow  Discussed reason for UDS, will try to collect today Continue routine postpartum care   LOS: 1 day   Tamera Stands, DO  05/23/2018, 9:19 AM

## 2018-05-23 NOTE — Progress Notes (Signed)
Post Partum Day 1 s/p NSVD, no prenatal care Subjective: no complaints, up ad lib, voiding and tolerating PO  Objective: Blood pressure 93/68, pulse (!) 55, temperature 98.2 F (36.8 C), temperature source Oral, resp. rate 16, SpO2 97 %, unknown if currently breastfeeding.  Physical Exam:  General: groggy Lochia: appropriate Uterine Fundus: firm DVT Evaluation: No evidence of DVT seen on physical exam.  Recent Labs    05/22/18 0205  HGB 10.9*  HCT 32.6*    Assessment/Plan: Plan for discharge tomorrow  She signed Medicaid BTL forms with me this morning.   LOS: 1 day   Allie Bossier 05/23/2018, 8:43 AM

## 2018-05-23 NOTE — Progress Notes (Addendum)
Pt notified RN that she was "finally able to force out enough urine for a sample". RN collected sample and sent to lab for drug screen per order. To note, the urine sample had absolutely no blood mixed with it nor any blood on the urine hat.

## 2018-05-24 MED ORDER — IBUPROFEN 800 MG PO TABS: 800.0000 mg | ORAL_TABLET | Freq: Three times a day (TID) | ORAL | 0 refills | Status: DC

## 2018-05-24 MED ORDER — LORATADINE 10 MG PO TABS
10.0000 mg | ORAL_TABLET | Freq: Every day | ORAL | Status: DC
  Administered 2018-05-24: 09:00:00 10 mg via ORAL
  Filled 2018-05-24: qty 1

## 2018-05-24 NOTE — Progress Notes (Signed)
CSW emphasized concerns to [facility] CPS, however, CSW has been informed that case has been assigned to [facility] who will follow up with MOB within 72 hours. CSW will continue to follow and update CPS of any additional concerns.   Archie Balboa, LCSWA  [facility] [phone]

## 2018-05-24 NOTE — Progress Notes (Signed)
Notified CNM Denton Meek of pt's nasal congestion, no sore throat, no cough, clear lungs, temp 97.9 oral. Pt sates this is seasonal allergies and requests claritin. Verbal order is received for claritin.

## 2018-06-01 NOTE — BH Specialist Note (Signed)
Attempt to contact patient to set up [employer] for visit today; pt does not have MyChart, and did not answer the phone; Left HIPPA-compliant message to call back Asher Muir from [employer] for [employer] at [phone].    Integrated Behavioral Health Visit via Telemedicine (Telephone)  06/01/2018 [facility] [place] [phone]   Rae Lips, LCSW

## 2018-06-05 ENCOUNTER — Other Ambulatory Visit: Payer: Self-pay

## 2018-06-05 ENCOUNTER — Ambulatory Visit: Payer: Medicaid Other | Admitting: Clinical

## 2018-06-06 DIAGNOSIS — F1020 Alcohol dependence, uncomplicated: Secondary | ICD-10-CM | POA: Insufficient documentation

## 2018-06-06 DIAGNOSIS — F1320 Sedative, hypnotic or anxiolytic dependence, uncomplicated: Secondary | ICD-10-CM | POA: Insufficient documentation

## 2018-06-06 DIAGNOSIS — F1220 Cannabis dependence, uncomplicated: Secondary | ICD-10-CM | POA: Insufficient documentation

## 2018-06-06 DIAGNOSIS — F1420 Cocaine dependence, uncomplicated: Secondary | ICD-10-CM | POA: Insufficient documentation

## 2018-06-06 DIAGNOSIS — F3132 Bipolar disorder, current episode depressed, moderate: Secondary | ICD-10-CM | POA: Insufficient documentation

## 2018-06-19 ENCOUNTER — Telehealth: Payer: Self-pay | Admitting: Advanced Practice Midwife

## 2018-06-19 NOTE — Telephone Encounter (Signed)
Called the patient to confirm the virtual appointment. Also sent the patient a link via text message to enroll in [place]. Stated she will setup the account once she ends the call. Stated she picks her child up from the hospital tomorrow, she would much rather have a virtual visit. Has questions about the date of her tubal. Stated she will discuss the concerns tomorrow.

## 2018-06-20 ENCOUNTER — Telehealth: Payer: Medicaid Other

## 2018-06-20 ENCOUNTER — Other Ambulatory Visit: Payer: Self-pay

## 2018-06-20 DIAGNOSIS — Z91199 Patient's noncompliance with other medical treatment and regimen due to unspecified reason: Secondary | ICD-10-CM

## 2018-06-20 DIAGNOSIS — Z5329 Procedure and treatment not carried out because of patient's decision for other reasons: Secondary | ICD-10-CM

## 2018-06-20 NOTE — Progress Notes (Signed)
Unable to reach patient for virtual visit. Will reschedule.  Rolm Bookbinder, CNM 06/20/18 3:55 PM

## 2018-06-21 ENCOUNTER — Telehealth (INDEPENDENT_AMBULATORY_CARE_PROVIDER_SITE_OTHER): Payer: Medicaid Other | Admitting: Nurse Practitioner

## 2018-06-21 ENCOUNTER — Encounter: Payer: Self-pay | Admitting: Nurse Practitioner

## 2018-06-21 ENCOUNTER — Other Ambulatory Visit: Payer: Self-pay

## 2018-06-21 DIAGNOSIS — Z1389 Encounter for screening for other disorder: Secondary | ICD-10-CM | POA: Diagnosis not present

## 2018-06-21 DIAGNOSIS — Z3009 Encounter for other general counseling and advice on contraception: Secondary | ICD-10-CM

## 2018-06-21 MED ORDER — IBUPROFEN 800 MG PO TABS: 800.0000 mg | ORAL_TABLET | Freq: Three times a day (TID) | ORAL | 0 refills | Status: DC

## 2018-06-21 NOTE — Progress Notes (Signed)
TELEHEALTH VIRTUAL POSTPARTUM VISIT ENCOUNTER NOTE  I connected with client on 06/21/18 at  3:55 PM EDT by MyChart at home and verified that I am speaking with the correct person using two identifiers.   I discussed the limitations, risks, security and privacy concerns of performing an evaluation and management service by telephone and the availability of in person appointments. I also discussed with the patient that there may be a patient responsible charge related to this service. The patient expressed understanding and agreed to proceed. Seen on MyChart video visit  Appointment Date: 06/21/2018  OB[facility]N Clinic: [facility]  Chief Complaint:  Chief Complaint  Patient presents with  . Postpartum Care    History of Present Illness: Summer Cain is a 33 y.o. Caucasian [id number] (No LMP recorded.), seen for the above chief complaint. Her past medical history is significant for Bipolar, Alcohol, cocaine and cannibis abuse.  She is s/p precipitous, preterm vaginal delivery on 05-22-18 at 36 weeks; she was discharged to home on 05-24-18. Pregnancy complicated by spotty prenatal care out of state and IUGR. Pecola LeisureBaby is doing well and living with her parents (all children are currently with the clients' parents).  Complains of continued vaginal bleeding and occasional abdominal cramping.  Did not get postpartum medications.  Pharmacy said the prescription had expired and needed to be resent.  Is currently under the care of a psychiatrist and is in counseling.  Has new medications and is being closely monitored.  Is scheduled for BTL on July 28 but client is not sure if she wants additional children.  Asked if her periods would stop when she had the tubal.  Long discussion of the expected permanence of a tubal ligation and some anecdotal reports of heavier bleeding with a tubal ligation.  Client wants to consider another option.  Discussion of IUD contraception as a LARC and is reversible.  Reviewed risks to IUD  including possible ectopic pregnancy if pregnancy occurs with IUD in place, imbedment and perforation.  Vaginal bleeding or discharge: Yes  Mode of feeding infant: Bottle Intercourse: No  Contraception: IUD - Liletta most appropriate choice PP depression s/s: No .  On medication and in counseling Any bowel or bladder issues: No   Some loose stools and frequent urination but no burning and likely due to fluid shifts in postpartum period. Pap smear: Pap smear is overdue and can be done at IUD insertion   Review of Systems: Positive for vaginal bleeding. Her 12 point review of systems is negative or as noted in the History of Present Illness.  Patient Active Problem List   Diagnosis Date Noted  . Preterm newborn infant with birth weight of 2,000 to 2,499 grams and 35 completed weeks of gestation 05/22/2018  . Precipitous delivery 05/22/2018  . No prenatal care in current pregnancy 05/22/2018  . Precipitate labor, with delivery 04/08/2017  . IUGR (intrauterine growth restriction) affecting care of mother 11/06/2015  . IUGR (intrauterine growth restriction) affecting care of mother, third trimester 10/29/2015  . Dental infection 10/21/2015  . Insufficient prenatal care in third trimester 10/08/2015  . Supervision of high risk pregnancy, antepartum 10/01/2015  . History of preterm delivery, currently pregnant in third trimester 10/01/2015  . THC and opiate use complicating pregnancy, antepartum 09/26/2015    Medications Sugar Benedetti had no medications administered during this visit. Current Outpatient Medications  Medication Sig Dispense Refill  . acetaminophen (TYLENOL) 325 MG tablet Take 2 tablets (650 mg total) by mouth every 4 (four) hours  as needed (for pain scale < 4). 60 tablet 1  . ibuprofen (ADVIL) 800 MG tablet Take 1 tablet (800 mg total) by mouth 3 (three) times daily. 30 tablet 0   No current facility-administered medications for this visit.     Allergies Latex   Physical Exam:  General:  Alert, oriented and cooperative.   Mental Status: Normal mood and affect perceived. Normal judgment and thought content.  Rest of physical exam deferred due to type of encounter  PP Depression Screening:   Edinburgh Postnatal Depression Scale - 06/21/18 1504      Edinburgh Postnatal Depression Scale:  In the Past 7 Days   I have been able to laugh and see the funny side of things.  0    I have looked forward with enjoyment to things.  1    I have blamed myself unnecessarily when things went wrong.  0    I have been anxious or worried for no good reason.  1    I have felt scared or panicky for no good reason.  0    Things have been getting on top of me.  0    I have been so unhappy that I have had difficulty sleeping.  1    I have felt sad or miserable.  1    I have been so unhappy that I have been crying.  1    The thought of harming myself has occurred to me.  0    Edinburgh Postnatal Depression Scale Total  5       Assessment:Patient is a 33 y.o. [id number] who is 4 weeks postpartum from a normal spontaneous vaginal delivery.  She is doing well.   Plan: 1. Encounter for postpartum visit Sent rx again to pharmacy for ibuprofen.  Advised to take with food around the clock for 5 days to see if vaginal bleeding will decrease. Advised continue no sex until IUD inserted. Return in one-2 weeks for Pap smear and IUD insertion. Continue to keep appointments with mental health providers.  2. Family planning education, guidance, and counseling Planning to have Liletta IUD inserted rather than BTL which is scheduled July 28. Wanting reversible contraception for now.   RTC 1-2 weeks for IUD insertion and PAP smear.  I discussed the assessment and treatment plan with the patient. The patient was provided an opportunity to ask questions and all were answered. The patient agreed with the plan and demonstrated an understanding of the instructions.   The patient was  advised to call back or seek an in-person evaluation/go to the ED for any concerning postpartum symptoms.  I provided 14 minutes of non-face-to-face time during this encounter.   Currie Paris, NP [facility] [employer], [facility] [phone]

## 2018-06-21 NOTE — Patient Instructions (Signed)

## 2018-07-10 ENCOUNTER — Ambulatory Visit: Payer: Medicaid Other | Admitting: Family Medicine

## 2018-07-10 ENCOUNTER — Telehealth: Payer: Self-pay | Admitting: Family Medicine

## 2018-07-10 NOTE — Telephone Encounter (Signed)
Attempted to call patient about her appointment, and how she needed to have her MyChart app downloaded in order to have this visit. A voicemail was left. °

## 2018-08-02 ENCOUNTER — Telehealth: Payer: Medicaid Other | Admitting: Obstetrics & Gynecology

## 2018-08-15 ENCOUNTER — Ambulatory Visit: Admit: 2018-08-15 | Payer: Medicaid Other | Admitting: Obstetrics & Gynecology

## 2018-08-15 SURGERY — LIGATION, FALLOPIAN TUBE, LAPAROSCOPIC
Anesthesia: Choice | Laterality: Bilateral

## 2019-03-31 ENCOUNTER — Emergency Department (HOSPITAL_COMMUNITY)
Admission: EM | Admit: 2019-03-31 | Discharge: 2019-04-01 | Discharge status: Against Medical Advice | Payer: Medicaid Other | Source: Home / Self Care

## 2019-03-31 ENCOUNTER — Encounter (HOSPITAL_COMMUNITY): Payer: Self-pay | Admitting: Emergency Medicine

## 2019-03-31 NOTE — ED Triage Notes (Signed)
Pt c/o swelling and pain to L dorsal hand x 4-5 days from IV drug use injection site.

## 2019-04-01 ENCOUNTER — Encounter (HOSPITAL_COMMUNITY): Payer: Self-pay | Admitting: Emergency Medicine

## 2019-04-01 ENCOUNTER — Inpatient Hospital Stay (HOSPITAL_COMMUNITY)
Admission: EM | Admit: 2019-04-01 | Discharge: 2019-04-03 | DRG: 603 | Discharge status: Against Medical Advice | Payer: Medicaid Other | Attending: Student in an Organized Health Care Education/Training Program | Admitting: Student in an Organized Health Care Education/Training Program

## 2019-04-01 ENCOUNTER — Emergency Department (HOSPITAL_COMMUNITY): Payer: Medicaid Other

## 2019-04-01 DIAGNOSIS — F17210 Nicotine dependence, cigarettes, uncomplicated: Secondary | ICD-10-CM | POA: Diagnosis present

## 2019-04-01 DIAGNOSIS — Z91040 Latex allergy status: Secondary | ICD-10-CM

## 2019-04-01 DIAGNOSIS — L299 Pruritus, unspecified: Secondary | ICD-10-CM | POA: Diagnosis present

## 2019-04-01 DIAGNOSIS — Z818 Family history of other mental and behavioral disorders: Secondary | ICD-10-CM | POA: Diagnosis not present

## 2019-04-01 DIAGNOSIS — F1220 Cannabis dependence, uncomplicated: Secondary | ICD-10-CM | POA: Diagnosis present

## 2019-04-01 DIAGNOSIS — B1710 Acute hepatitis C without hepatic coma: Secondary | ICD-10-CM

## 2019-04-01 DIAGNOSIS — Z8249 Family history of ischemic heart disease and other diseases of the circulatory system: Secondary | ICD-10-CM

## 2019-04-01 DIAGNOSIS — Z20822 Contact with and (suspected) exposure to covid-19: Secondary | ICD-10-CM | POA: Diagnosis present

## 2019-04-01 DIAGNOSIS — F319 Bipolar disorder, unspecified: Secondary | ICD-10-CM | POA: Diagnosis present

## 2019-04-01 DIAGNOSIS — I808 Phlebitis and thrombophlebitis of other sites: Secondary | ICD-10-CM | POA: Diagnosis not present

## 2019-04-01 DIAGNOSIS — F1123 Opioid dependence with withdrawal: Secondary | ICD-10-CM | POA: Diagnosis present

## 2019-04-01 DIAGNOSIS — M419 Scoliosis, unspecified: Secondary | ICD-10-CM | POA: Diagnosis present

## 2019-04-01 DIAGNOSIS — Z872 Personal history of diseases of the skin and subcutaneous tissue: Secondary | ICD-10-CM

## 2019-04-01 DIAGNOSIS — F1320 Sedative, hypnotic or anxiolytic dependence, uncomplicated: Secondary | ICD-10-CM | POA: Diagnosis present

## 2019-04-01 DIAGNOSIS — B1920 Unspecified viral hepatitis C without hepatic coma: Secondary | ICD-10-CM | POA: Diagnosis present

## 2019-04-01 DIAGNOSIS — F419 Anxiety disorder, unspecified: Secondary | ICD-10-CM | POA: Diagnosis present

## 2019-04-01 DIAGNOSIS — D509 Iron deficiency anemia, unspecified: Secondary | ICD-10-CM | POA: Diagnosis present

## 2019-04-01 DIAGNOSIS — Z833 Family history of diabetes mellitus: Secondary | ICD-10-CM | POA: Diagnosis not present

## 2019-04-01 DIAGNOSIS — R7989 Other specified abnormal findings of blood chemistry: Secondary | ICD-10-CM

## 2019-04-01 DIAGNOSIS — R8281 Pyuria: Secondary | ICD-10-CM | POA: Diagnosis present

## 2019-04-01 DIAGNOSIS — F1990 Other psychoactive substance use, unspecified, uncomplicated: Secondary | ICD-10-CM | POA: Diagnosis present

## 2019-04-01 DIAGNOSIS — F1420 Cocaine dependence, uncomplicated: Secondary | ICD-10-CM | POA: Diagnosis present

## 2019-04-01 DIAGNOSIS — R8271 Bacteriuria: Secondary | ICD-10-CM | POA: Diagnosis present

## 2019-04-01 DIAGNOSIS — Z5329 Procedure and treatment not carried out because of patient's decision for other reasons: Secondary | ICD-10-CM | POA: Diagnosis not present

## 2019-04-01 DIAGNOSIS — L03114 Cellulitis of left upper limb: Secondary | ICD-10-CM | POA: Diagnosis present

## 2019-04-01 DIAGNOSIS — F1190 Opioid use, unspecified, uncomplicated: Secondary | ICD-10-CM | POA: Diagnosis not present

## 2019-04-01 DIAGNOSIS — Z8614 Personal history of Methicillin resistant Staphylococcus aureus infection: Secondary | ICD-10-CM

## 2019-04-01 DIAGNOSIS — F1910 Other psychoactive substance abuse, uncomplicated: Secondary | ICD-10-CM

## 2019-04-01 DIAGNOSIS — Z8349 Family history of other endocrine, nutritional and metabolic diseases: Secondary | ICD-10-CM

## 2019-04-01 DIAGNOSIS — R7401 Elevation of levels of liver transaminase levels: Secondary | ICD-10-CM

## 2019-04-01 DIAGNOSIS — I809 Phlebitis and thrombophlebitis of unspecified site: Secondary | ICD-10-CM | POA: Diagnosis not present

## 2019-04-01 DIAGNOSIS — K759 Inflammatory liver disease, unspecified: Secondary | ICD-10-CM | POA: Diagnosis present

## 2019-04-01 DIAGNOSIS — F1020 Alcohol dependence, uncomplicated: Secondary | ICD-10-CM | POA: Diagnosis present

## 2019-04-01 LAB — CBC WITH DIFFERENTIAL/PLATELET
Abs Immature Granulocytes: 0.02 10*3/uL (ref 0.00–0.07)
Basophils Absolute: 0 10*3/uL (ref 0.0–0.1)
Basophils Relative: 0 %
Eosinophils Absolute: 0.1 10*3/uL (ref 0.0–0.5)
Eosinophils Relative: 1 %
HCT: 42.6 % (ref 36.0–46.0)
Hemoglobin: 13.2 g/dL (ref 12.0–15.0)
Immature Granulocytes: 0 %
Lymphocytes Relative: 29 %
Lymphs Abs: 2.3 10*3/uL (ref 0.7–4.0)
MCH: 27.4 pg (ref 26.0–34.0)
MCHC: 31 g/dL (ref 30.0–36.0)
MCV: 88.4 fL (ref 80.0–100.0)
Monocytes Absolute: 0.4 10*3/uL (ref 0.1–1.0)
Monocytes Relative: 5 %
Neutro Abs: 5.3 10*3/uL (ref 1.7–7.7)
Neutrophils Relative %: 65 %
Platelets: 266 10*3/uL (ref 150–400)
RBC: 4.82 MIL/uL (ref 3.87–5.11)
RDW: 15.1 % (ref 11.5–15.5)
WBC: 8.1 10*3/uL (ref 4.0–10.5)
nRBC: 0 % (ref 0.0–0.2)

## 2019-04-01 LAB — URINALYSIS, ROUTINE W REFLEX MICROSCOPIC
Glucose, UA: NEGATIVE mg/dL
Hgb urine dipstick: NEGATIVE
Ketones, ur: NEGATIVE mg/dL
Nitrite: NEGATIVE
Protein, ur: NEGATIVE mg/dL
Specific Gravity, Urine: 1.029 (ref 1.005–1.030)
pH: 5 (ref 5.0–8.0)

## 2019-04-01 LAB — COMPREHENSIVE METABOLIC PANEL
ALT: 330 U/L — ABNORMAL HIGH (ref 0–44)
AST: 197 U/L — ABNORMAL HIGH (ref 15–41)
Albumin: 3.3 g/dL — ABNORMAL LOW (ref 3.5–5.0)
Alkaline Phosphatase: 145 U/L — ABNORMAL HIGH (ref 38–126)
Anion gap: 13 (ref 5–15)
BUN: 10 mg/dL (ref 6–20)
CO2: 21 mmol/L — ABNORMAL LOW (ref 22–32)
Calcium: 8.8 mg/dL — ABNORMAL LOW (ref 8.9–10.3)
Chloride: 102 mmol/L (ref 98–111)
Creatinine, Ser: 0.57 mg/dL (ref 0.44–1.00)
GFR calc Af Amer: >60 mL/min (ref >60)
GFR calc non Af Amer: >60 mL/min (ref >60)
Glucose, Bld: 96 mg/dL (ref 70–99)
Potassium: 3.7 mmol/L (ref 3.5–5.1)
Sodium: 136 mmol/L (ref 135–145)
Total Bilirubin: 2.1 mg/dL — ABNORMAL HIGH (ref 0.3–1.2)
Total Protein: 6.8 g/dL (ref 6.5–8.1)

## 2019-04-01 LAB — RAPID URINE DRUG SCREEN, HOSP PERFORMED
Amphetamines: POSITIVE — AB
Barbiturates: NOT DETECTED
Benzodiazepines: NOT DETECTED
Cocaine: POSITIVE — AB
Opiates: POSITIVE — AB
Tetrahydrocannabinol: POSITIVE — AB

## 2019-04-01 LAB — HEPATITIS C ANTIBODY: HCV Ab: REACTIVE — AB

## 2019-04-01 LAB — LACTIC ACID, PLASMA
Lactic Acid, Venous: 0.9 mmol/L (ref 0.5–1.9)
Lactic Acid, Venous: 1.7 mmol/L (ref 0.5–1.9)

## 2019-04-01 LAB — BILIRUBIN, FRACTIONATED(TOT/DIR/INDIR)
Bilirubin, Direct: 0.9 mg/dL — ABNORMAL HIGH (ref 0.0–0.2)
Indirect Bilirubin: 0.9 mg/dL (ref 0.3–0.9)
Total Bilirubin: 1.8 mg/dL — ABNORMAL HIGH (ref 0.3–1.2)

## 2019-04-01 LAB — I-STAT BETA HCG BLOOD, ED (MC, WL, AP ONLY): I-stat hCG, quantitative: <5 m[IU]/mL (ref <5)

## 2019-04-01 LAB — APTT: aPTT: 36 seconds (ref 24–36)

## 2019-04-01 LAB — HEPATITIS B CORE ANTIBODY, TOTAL: Hep B Core Total Ab: NONREACTIVE

## 2019-04-01 LAB — HEPATITIS B SURFACE ANTIGEN: Hepatitis B Surface Ag: NONREACTIVE

## 2019-04-01 LAB — PROTIME-INR
INR: 0.9 (ref 0.8–1.2)
Prothrombin Time: 12.5 seconds (ref 11.4–15.2)

## 2019-04-01 LAB — HIV ANTIBODY (ROUTINE TESTING W REFLEX): HIV Screen 4th Generation wRfx: NONREACTIVE

## 2019-04-01 MED ORDER — LACTATED RINGERS IV BOLUS
1000.0000 mL | Freq: Once | INTRAVENOUS | Status: AC
  Administered 2019-04-01: 07:00:00 1000 mL via INTRAVENOUS

## 2019-04-01 MED ORDER — LACTATED RINGERS IV BOLUS
1000.0000 mL | Freq: Once | INTRAVENOUS | Status: AC
  Administered 2019-04-01: 1000 mL via INTRAVENOUS

## 2019-04-01 MED ORDER — HYDROMORPHONE HCL 1 MG/ML IJ SOLN
1.0000 mg | INTRAMUSCULAR | Status: DC | PRN
  Administered 2019-04-01 – 2019-04-03 (×5): 1 mg via INTRAVENOUS
  Filled 2019-04-01 (×5): qty 1

## 2019-04-01 MED ORDER — SODIUM CHLORIDE 0.9 % IV SOLN
1.0000 g | INTRAVENOUS | Status: DC
  Administered 2019-04-01 – 2019-04-02 (×2): 1 g via INTRAVENOUS
  Filled 2019-04-01 (×3): qty 10

## 2019-04-01 MED ORDER — HEPARIN SODIUM (PORCINE) 5000 UNIT/ML IJ SOLN
5000.0000 [IU] | Freq: Three times a day (TID) | INTRAMUSCULAR | Status: DC
  Administered 2019-04-01 – 2019-04-02 (×5): 5000 [IU] via SUBCUTANEOUS
  Filled 2019-04-01 (×4): qty 1

## 2019-04-01 MED ORDER — VANCOMYCIN HCL IN DEXTROSE 1-5 GM/200ML-% IV SOLN
1000.0000 mg | Freq: Once | INTRAVENOUS | Status: AC
  Administered 2019-04-01: 07:00:00 1000 mg via INTRAVENOUS
  Filled 2019-04-01: qty 200

## 2019-04-01 NOTE — ED Notes (Signed)
Pt to and from xray via cart

## 2019-04-01 NOTE — ED Notes (Signed)
Pt wants to leave AMA , Admitting MD called , came to talk to pt , Pt. Has agreed to stay for now

## 2019-04-01 NOTE — ED Provider Notes (Signed)
Emergency Department Provider Note   I have reviewed the triage vital signs and the nursing notes.   HISTORY  Chief Complaint Abscess   HPI Summer Cain is a 34 y.o. female who presents the emerge department today with left hand swelling and pain.  Patient states that she recently had some weight try to shoot up there and since then she is a progressively worsening swelling, pain or redness and warmth over the last 4 days.  States had a fever at home as high as 102.0.  No chest pain or shortness of breath.  She is sleepy but she states that she was here last night was not seen then went walked around all night and then came back and think she sleepy because of that.  She states that she thinks she is pregnant currently secondary to a pregnancy test she took at home.  She states that she is quit using drugs recently.  She relates a history of multiple areas that have drained pus on her arms before but it is unclear what that means.  She also states that she has had a staph infection on her finger 1 time before.   No other associated or modifying symptoms.    Past Medical History:  Diagnosis Date  . Pregnant   . Scoliosis     Patient Active Problem List   Diagnosis Date Noted  . Alcohol use disorder, severe, dependence ([place]) 06/06/2018  . Bipolar affective disorder, currently depressed, moderate ([place]) 06/06/2018  . Hypnotic dependence with current use ([place]) 06/06/2018  . Moderate cannabis use disorder ([place]) 06/06/2018  . Moderate cocaine use disorder ([place]) 06/06/2018    Past Surgical History:  Procedure Laterality Date  . KIDNEY STONE SURGERY     Allergies Latex  Family History  Problem Relation Age of Onset  . Hypertension Mother   . Depression Mother   . Hyperlipidemia Father   . Diabetes Paternal Grandmother     Social History Social History   Tobacco Use  . Smoking status: Light Tobacco Smoker    Types: Cigarettes  . Smokeless tobacco: Never Used  .  Tobacco comment: uses vape pen primarily now  Substance Use Topics  . Alcohol use: Yes    Comment: not since found out was pregnant at 2 mths.  . Drug use: Yes    Types: IV    Review of Systems  All other systems negative except as documented in the HPI. All pertinent positives and negatives as reviewed in the HPI. ____________________________________________   PHYSICAL EXAM:  VITAL SIGNS: ED Triage Vitals  Enc Vitals Group     BP 04/01/19 0521 112/83     Pulse Rate 04/01/19 0521 78     Resp 04/01/19 0521 18     Temp 04/01/19 0521 97.8 F (36.6 C)     Temp Source 04/01/19 0558 Oral     SpO2 04/01/19 0521 97 %    Constitutional: Alert and oriented. Well appearing and in no acute distress. Eyes: Conjunctivae are normal. PERRL. EOMI. Head: Atraumatic. Nose: No congestion/rhinnorhea. Mouth/Throat: Mucous membranes are moist.  Oropharynx non-erythematous. Neck: No stridor.  No meningeal signs.   Cardiovascular: Normal rate, regular rhythm. Good peripheral circulation. Grossly normal heart sounds.   Respiratory: Normal respiratory effort.  No retractions. Lungs CTAB. Gastrointestinal: Soft and nontender. No distention.  Musculoskeletal: No lower extremity tenderness nor edema. No gross deformities of extremities. Neurologic:  Normal speech and language. No gross focal neurologic deficits are appreciated.  Skin:  Scattered track marks. Significant erythema/edema/ttp to dorsal left hand from DIP to just proximal to wrist with sharp demarcated areas.  Multiple nodular areas on upper extremities as well.  ____________________________________________   LABS (all labs ordered are listed, but only abnormal results are displayed)  Labs Reviewed  CULTURE, BLOOD (ROUTINE X 2)  CULTURE, BLOOD (ROUTINE X 2)  URINE CULTURE  LACTIC ACID, PLASMA  CBC WITH DIFFERENTIAL/PLATELET  APTT  PROTIME-INR  LACTIC ACID, PLASMA  COMPREHENSIVE METABOLIC PANEL  URINALYSIS, ROUTINE W REFLEX  MICROSCOPIC  I-STAT BETA HCG BLOOD, ED ([facility], [facility], [facility] ONLY)   ____________________________________________  EKG   EKG Interpretation  Date/Time:    Ventricular Rate:    PR Interval:    QRS Duration:   QT Interval:    QTC Calculation:   R Axis:     Text Interpretation:         ____________________________________________  RADIOLOGY  DG Hand Complete Left  Result Date: 04/01/2019 CLINICAL DATA:  Swelling at IV injection site of hand. EXAM: LEFT HAND - COMPLETE 3+ VIEW COMPARISON:  10/29/2018 FINDINGS: Dorsal soft tissue swelling . No evidence of fracture, erosion, or malalignment. Remote fourth proximal phalanx head fracture. IMPRESSION: Soft tissue swelling without acute osseous finding or opaque foreign body. Electronically Signed   By: Marnee Spring M.D.   On: 04/01/2019 07:02    ____________________________________________   PROCEDURES  Procedure(s) performed:   Procedures   ____________________________________________   INITIAL IMPRESSION / ASSESSMENT AND PLAN / ED COURSE  Patient with pretty extensive tight cellulitis to her hand without evidence of superficial drainable abscess.  She is not septic at this point but has a history of bacteremia.  This is been progressively worsening for 4 days.  She is also introduced foreign bodies on her own concern for possible deep-seated infection that we can evaluate very well on physical exam.  Suspect the patient would likely need to be admitted on IV antibiotics which he already been started after blood cultures were drawn.  No labs done at the time of shift change, transfer of care pending labs, reevaluation and ultimately decision for disposition.     Pertinent labs & imaging results that were available during my care of the patient were reviewed by me and considered in my medical decision making (see chart for details).  ____________________________________________  FINAL CLINICAL IMPRESSION(S) / ED  DIAGNOSES  Final diagnoses:  None     MEDICATIONS GIVEN DURING THIS VISIT:  Medications  vancomycin (VANCOCIN) IVPB 1000 mg/200 mL premix (1,000 mg Intravenous New Bag/Given 04/01/19 0652)  lactated ringers bolus 1,000 mL (1,000 mLs Intravenous New Bag/Given 04/01/19 0651)     NEW OUTPATIENT MEDICATIONS STARTED DURING THIS VISIT:  New Prescriptions   No medications on file    Note:  This note was prepared with assistance of Dragon voice recognition software. Occasional wrong-word or sound-a-like substitutions may have occurred due to the inherent limitations of voice recognition software.   Marynell Bies, Barbara Cower, MD 04/02/19 [phone]

## 2019-04-01 NOTE — ED Notes (Signed)
NT Anise Salvo was assisting the patient to the  when she found an uncapped needle on the patient's bed. The needle was not any needle that we carry here in the hospital. She brought it to my attention and we immediately discarded the contents of the syringe in the narcotic waste bin in the medication room and properly disposed of the sharps in the sharps container.

## 2019-04-01 NOTE — ED Triage Notes (Signed)
Pt c/o redness, swelling to L hand. IV drug injection site

## 2019-04-01 NOTE — H&P (Addendum)
Date: 04/01/2019               Patient Name:  Summer Cain MRN: [id number]  DOB: 1985-06-22 Age / Sex: 34 y.o., female   PCP: Patient, No Pcp Per         Medical Service: Internal Medicine Teaching Service         Attending Physician: Dr. Reymundo Poll, MD    First Contact: Marchia Bond, DO, Sharlet Salina Pager: [facility] [phone])  Second Contact: Dortha Schwalbe, MD, [place] Pager: [facility] [phone])       After Hours (After 5p/  First Contact Pager: [phone]  weekends / holidays): Second Contact Pager: [phone]   Chief Complaint: Left hand pain and swelling  History of Present Illness: Summer Cain is a 34 year old female with a pertinent past medical history of substance use disorder (EtOH, cannabis, cocaine) and  bipolar disorder who presents with a roughly 4 day history of left hand swelling and pain.  She states that she recently used this location to inject heroin and has since had progressively worsening of her symptoms including swelling, pain, redness and warmth.  She has had associated fevers as high as 102F.  She is concerned that she might be pregnant as she recently had a positive pregnancy test at home.  She does have a history of similar symptoms from IV drug use with multiple areas in her arms becoming red and draining pus.  Previous history of staph infection of her finger before last October for a similar circumstance.  She denies any chest pain, shortness of breath, back/neck pain, sick contacts.   The last time she injected Heroin was 2 days ago. She also used meth several days ago. She reports of subjective fevers and chills. She denies chest pain, shortness of breath, cough. She reports of prior symptoms of opioid withdrawal including diarrhea, tremors, diaphoresis. She reports previously on Suboxone through the [facility].   She states that she tested positive for pregnancy within the past week. She denies vaginal discharge but endorses pain with intercourse. She does not use  contraceptive, but admits to only being sexually active with one female partner. She denies dysuria or hematuria.    ED Course: n/a    Lab Orders     Blood Culture (routine x 2)     Urine culture     Lactic acid, plasma     Comprehensive metabolic panel     CBC WITH DIFFERENTIAL     APTT     Protime-INR     Urinalysis, Routine w reflex microscopic     Hepatitis B core antibody, total     Hepatitis B surface antibody     Hepatitis B surface antigen     Hepatitis C antibody     Urine rapid drug screen (hosp performed)     HIV Antibody (routine testing w rflx)     Comprehensive metabolic panel     CBC     Protime-INR     Bilirubin, fractionated(tot/dir/indir)     HCV RNA quant     I-Stat beta hCG blood, ED   Meds:  No outpatient medications have been marked as taking for the 04/01/19 encounter [facility] Encounter).    Social: Lives with a roommate. She has 4 children who have all been taken away from her due to her opioid use disorder. She cleans houses for a living. Lives in [place], [place]  Family History:  Family History  Problem Relation Age of Onset  . Hypertension Mother   .  Depression Mother   . Hyperlipidemia Father   . Diabetes Paternal Grandmother     Allergies: Allergies as of 04/01/2019 - Review Complete 04/01/2019  Allergen Reaction Noted  . Latex Swelling 09/26/2015   Past Medical History:  Diagnosis Date  . Pregnant   . Scoliosis     Review of Systems: A complete ROS was negative except as per HPI.   Physical Exam: Blood pressure 115/80, pulse 96, temperature 98 F (36.7 C), temperature source Oral, resp. rate 20, height 5' (1.524 m), weight 52 kg, SpO2 100 %, not currently breastfeeding. Physical Exam  Constitutional: She is oriented to person, place, and time. She appears malnourished. She appears distressed.  HENT:  Head: Normocephalic and atraumatic.  Eyes: EOM are normal.  Cardiovascular: Intact distal pulses. Tachycardia present.  No  murmur heard. Pulmonary/Chest: Effort normal and breath sounds normal.  Abdominal: Soft. She exhibits no distension (diffuse). There is abdominal tenderness.  Musculoskeletal:        General: Tenderness (diffuse myalgias) present. No edema. Normal range of motion.     Cervical back: Normal range of motion.  Neurological: She is alert and oriented to person, place, and time.  Skin: She is diaphoretic.  Multiple excoriations of her upper extremities bilaterally consistent with injection related trauma.  Excoriations have surrounding crust and erythema without purulence.  Left hand is swollen with significant erythema and pain.  Pain limited examination for areas of fluctuance  Psychiatric: Her mood appears anxious. Her affect is labile.     Labs: CBC    Component Value Date/Time   WBC 8.1 04/01/2019 0630   RBC 4.82 04/01/2019 0630   HGB 13.2 04/01/2019 0630   HCT 42.6 04/01/2019 0630   PLT 266 04/01/2019 0630   MCV 88.4 04/01/2019 0630   MCH 27.4 04/01/2019 0630   MCHC 31.0 04/01/2019 0630   RDW 15.1 04/01/2019 0630   LYMPHSABS 2.3 04/01/2019 0630   MONOABS 0.4 04/01/2019 0630   EOSABS 0.1 04/01/2019 0630   BASOSABS 0.0 04/01/2019 0630  Lactic acid: 0.9 > 1.7 Blood cultures: Pending  CMP     Component Value Date/Time   NA 136 04/01/2019 0630   K 3.7 04/01/2019 0630   CL 102 04/01/2019 0630   CO2 21 (L) 04/01/2019 0630   GLUCOSE 96 04/01/2019 0630   BUN 10 04/01/2019 0630   CREATININE 0.57 04/01/2019 0630   CALCIUM 8.8 (L) 04/01/2019 0630   PROT 6.8 04/01/2019 0630   ALBUMIN 3.3 (L) 04/01/2019 0630   AST 197 (H) 04/01/2019 0630   ALT 330 (H) 04/01/2019 0630   ALKPHOS 145 (H) 04/01/2019 0630   BILITOT 1.8 (H) 04/01/2019 1101   GFRNONAA >60 04/01/2019 0630   GFRAA >60 04/01/2019 0630   Hepatic Function Panel     Component Value Date/Time   PROT 6.8 04/01/2019 0630   ALBUMIN 3.3 (L) 04/01/2019 0630   AST 197 (H) 04/01/2019 0630   ALT 330 (H) 04/01/2019 0630    ALKPHOS 145 (H) 04/01/2019 0630   BILITOT 1.8 (H) 04/01/2019 1101   BILIDIR 0.9 (H) 04/01/2019 1101   IBILI 0.9 04/01/2019 1101  aPTT: 36 INR: 0.9  Urinalysis    Component Value Date/Time   COLORURINE AMBER (A) 04/01/2019 1017   APPEARANCEUR HAZY (A) 04/01/2019 1017   LABSPEC 1.029 04/01/2019 1017   PHURINE 5.0 04/01/2019 1017   GLUCOSEU NEGATIVE 04/01/2019 1017   HGBUR NEGATIVE 04/01/2019 1017   BILIRUBINUR SMALL (A) 04/01/2019 1017   KETONESUR NEGATIVE 04/01/2019 1017  PROTEINUR NEGATIVE 04/01/2019 1017   NITRITE NEGATIVE 04/01/2019 1017   LEUKOCYTESUR SMALL (A) 04/01/2019 1017  Micro exam: 11-20 WBC's per HPF, 6-10 RBC's per HPF, Rare+ bacteria, Ca oxylate crystals seen and Mucus casts seen. Urine Culture: Pending  Drugs of Abuse     Component Value Date/Time   LABOPIA POSITIVE (A) 04/01/2019 1017   COCAINSCRNUR POSITIVE (A) 04/01/2019 1017   LABBENZ NONE DETECTED 04/01/2019 1017   AMPHETMU POSITIVE (A) 04/01/2019 1017   THCU POSITIVE (A) 04/01/2019 1017   LABBARB NONE DETECTED 04/01/2019 1017   Serum hCG: <5.0  HIV: pending HBV: pending HCV: pending  Imaging: DG Hand Complete Left  Result Date: 04/01/2019 CLINICAL DATA:  Swelling at IV injection site of hand. EXAM: LEFT HAND - COMPLETE 3+ VIEW COMPARISON:  10/29/2018 FINDINGS: Dorsal soft tissue swelling . No evidence of fracture, erosion, or malalignment. Remote fourth proximal phalanx head fracture. IMPRESSION: Soft tissue swelling without acute osseous finding or opaque foreign body. Electronically Signed   By: Monte Fantasia M.D.   On: 04/01/2019 07:02    EKG: personally reviewed my interpretation is sinus rhythm  Assessment & Plan by Problem: Principal Problem:   Non-Purulent Cellulitis of left hand Active Problems:   Hepatitis   Substance use disorder   Bacteriuria with pyuria   Hepatitis C   Kierra Snapp is a 34 y.o. with pertinent PMH of substance use disorder (EtOH, cannabis, cocaine) and   bipolar disorder  who presented with left dorsal hand erythema and edema and admit for nonpurulent cellulitis on hospital day 0  #Nonpurulent cellulitis, left Dorsal Hand Patient presents with left dorsal hand swelling and erythema in the setting of IV drug use and is being treated for cellulitis.  Difficult to appreciate any fluctuance or purulence.  We will start her on IV ceftriaxone awaiting for blood cultures.  If blood cultures are positive for bacteremia she will likely need further evaluation for bacterial endocarditis.  She does not show any overt signs of endocarditis such as murmur, chest pain, skin or immunologic stigmata. -IV ceftriaxone 1 g every 24 hours -Continue cardiac monitoring -Patient was given a liter bolus of fluids in the ED and 5 will give her another liter. -Hand surgery consulted in the ED for further evaluation and management.  #Acute Hepatitis, unknown etiology: Patient presents with elevated liver enzymes consistent with a hepatocellular dysfunction.  She does have several risk factors for blood related infections including IV drug use and unprotected sexual intercourse. -HIV screening -Hepatitis C and B screening  #Polysubstance-use Disorder:  Patient presents with a history of polysubstance use including IV heroin, methamphetamines, THC.  She states that in the last week she has had meth and heroin as recent as 2 days ago.  She does seem to be exhibiting signs and symptoms concerning for opioid withdrawal including muscle pain, restlessness, diaphoresis, anxiety, tachycardia, and tremor.  -Start COWS protocol -Started Dilaudid 1 mg every 4 hours as needed for pain and withdrawal  Diet: NPO VTE: Heparin IVF: LR,Bolus Code: Full  Prior to Admission Living Arrangement: Home, living Roommate Anticipated Discharge Location: Home Barriers to Discharge: Further medical evaluation and management  Dispo: Admit patient to Inpatient with expected length of stay  greater than 2 midnights.  Signed: Marianna Payment, D.O. Date 04/01/2019 [facility] Internal Medicine, PGY-1 Pager: [phone]

## 2019-04-01 NOTE — ED Provider Notes (Signed)
Accepted patient at change of shift, 34 year old female presenting with fevers over 102 with cellulitis over the dorsum of the left hand with inability to extend the fingers secondary to severe pain.  She is not tachycardic on exam with a pulse of 90, blood pressure currently 94/71, she is speaking in full sentences.  I do not detect any cardiac murmurs.  She has evidence of track marks in bilateral antecubital fossa's as well as scattered over the bilateral arms and other places.  Antibiotic started, labs pending.        Discussed with hand surgeon who will see the patient in consultation today in the hospital.  Will admit to hospital - medical service - they have called back and accepted care of the patient.  Final diagnoses:  Cellulitis of left hand  IVDU (intravenous drug user)      Eber Hong, MD 04/01/19 [phone]

## 2019-04-01 NOTE — ED Notes (Signed)
Called no answer

## 2019-04-01 NOTE — ED Notes (Signed)
This nurse tech found a needle containing an unknown substance in the pts bed while providing pt care. The substance was immediately wasted in the med room with Victorino Dike, RN present and needle disposed of in sharps container.

## 2019-04-01 NOTE — ED Notes (Signed)
Removed small bag from pt belonging with a white powder substance in it , was given to security to depose of along with a needle and syringe that was placed in sharps box

## 2019-04-01 NOTE — ED Notes (Signed)
Pt came to the ED per triage complaint. Pt conscious, breathing, and A&Ox4. Pt brought back to [facility] via wheelchair. Pt endorses "I injected heroin into my left hand and now there is redness and swelling". Chest rise and fall equally with non-labored breathing. Lungs clear apex to base. Abd soft and non-tender. Pt denies chest pain, n/v/d, shortness of breath, and f/c. Pt endorses pain on the left hand. Pt endorses pain 8 out of 10 pain that's vice-like. PIVC placed on the right forearm with a 20G which had positive blood return and flushed without pain or infiltration. Blood collected, labeled, and sent to lab. Bed in lowest position with call light within reach. Pt on continuous blood pressure, pulse ox, and cardiac monitor. Will continue to monitor. Awaiting MD eval. No distress noted.

## 2019-04-02 ENCOUNTER — Inpatient Hospital Stay (HOSPITAL_COMMUNITY): Payer: Medicaid Other

## 2019-04-02 ENCOUNTER — Other Ambulatory Visit: Payer: Self-pay

## 2019-04-02 DIAGNOSIS — L03114 Cellulitis of left upper limb: Secondary | ICD-10-CM

## 2019-04-02 LAB — CBC
HCT: 36.6 % (ref 36.0–46.0)
Hemoglobin: 11.3 g/dL — ABNORMAL LOW (ref 12.0–15.0)
MCH: 27 pg (ref 26.0–34.0)
MCHC: 30.9 g/dL (ref 30.0–36.0)
MCV: 87.6 fL (ref 80.0–100.0)
Platelets: 268 10*3/uL (ref 150–400)
RBC: 4.18 MIL/uL (ref 3.87–5.11)
RDW: 15.1 % (ref 11.5–15.5)
WBC: 7 10*3/uL (ref 4.0–10.5)
nRBC: 0 % (ref 0.0–0.2)

## 2019-04-02 LAB — COMPREHENSIVE METABOLIC PANEL
ALT: 287 U/L — ABNORMAL HIGH (ref 0–44)
AST: 169 U/L — ABNORMAL HIGH (ref 15–41)
Albumin: 3 g/dL — ABNORMAL LOW (ref 3.5–5.0)
Alkaline Phosphatase: 133 U/L — ABNORMAL HIGH (ref 38–126)
Anion gap: 9 (ref 5–15)
BUN: 8 mg/dL (ref 6–20)
CO2: 27 mmol/L (ref 22–32)
Calcium: 8.7 mg/dL — ABNORMAL LOW (ref 8.9–10.3)
Chloride: 104 mmol/L (ref 98–111)
Creatinine, Ser: 0.6 mg/dL (ref 0.44–1.00)
GFR calc Af Amer: >60 mL/min (ref >60)
GFR calc non Af Amer: >60 mL/min (ref >60)
Glucose, Bld: 112 mg/dL — ABNORMAL HIGH (ref 70–99)
Potassium: 3.8 mmol/L (ref 3.5–5.1)
Sodium: 140 mmol/L (ref 135–145)
Total Bilirubin: 1.5 mg/dL — ABNORMAL HIGH (ref 0.3–1.2)
Total Protein: 6.4 g/dL — ABNORMAL LOW (ref 6.5–8.1)

## 2019-04-02 LAB — HCV RNA QUANT
HCV Quantitative Log: 5.328 log10 IU/mL (ref >1.70)
HCV Quantitative: 213000 IU/mL (ref >50)

## 2019-04-02 LAB — URINE CULTURE: Culture: NO GROWTH

## 2019-04-02 LAB — PROTIME-INR
INR: 0.9 (ref 0.8–1.2)
Prothrombin Time: 12.3 seconds (ref 11.4–15.2)

## 2019-04-02 MED ORDER — VANCOMYCIN HCL 1250 MG/250ML IV SOLN
1250.0000 mg | INTRAVENOUS | Status: DC
  Administered 2019-04-02: 1250 mg via INTRAVENOUS
  Filled 2019-04-02 (×2): qty 250

## 2019-04-02 NOTE — Progress Notes (Signed)
Declines wearing teley monitor 2ndry to the patches cause her to itch

## 2019-04-02 NOTE — Plan of Care (Signed)
  Problem: Clinical Measurements: Goal: Ability to maintain clinical measurements within normal limits will improve Outcome: Progressing   Problem: Activity: Goal: Risk for activity intolerance will decrease Outcome: Progressing   Problem: Nutrition: Goal: Adequate nutrition will be maintained 04/02/2019 0023 by Martyn Ehrich, RN Outcome: Progressing 04/02/2019 0022 by Martyn Ehrich, RN Outcome: Progressing   Problem: Coping: Goal: Level of anxiety will decrease Outcome: Progressing   Problem: Elimination: Goal: Will not experience complications related to urinary retention Outcome: Progressing   Problem: Pain Managment: Goal: General experience of comfort will improve Outcome: Progressing   Problem: Safety: Goal: Ability to remain free from injury will improve Outcome: Progressing

## 2019-04-02 NOTE — Consult Note (Signed)
Reason for Consult/CC: Left hand infection  Summer Cain is an 34 y.o. female.  HPI: Patient presents with several day history of pain erythema and swelling in the dorsal aspect the left hand.  She has a history of IV drug use and has injected this area multiple times.  She noticed this getting worse and ultimately tried to drain it herself the other day before coming to the ER.  She has not been febrile or had a white count.  She has quite a bit of pain in the area of concern while moving her fingers.  She has had a staph infection in this area before.  Allergies:  Allergies  Allergen Reactions  . Latex Swelling    Medications:  Current Facility-Administered Medications:  .  cefTRIAXone (ROCEPHIN) 1 g in sodium chloride 0.9 % 100 mL IVPB, 1 g, Intravenous, Q24H, Agyei, Obed K, MD, Stopped at 04/01/19 1410 .  heparin injection 5,000 Units, 5,000 Units, Subcutaneous, Q8H, Agyei, Obed K, MD, 5,000 Units at 04/02/19 0521 .  HYDROmorphone (DILAUDID) injection 1 mg, 1 mg, Intravenous, Q4H PRN, Dortha Schwalbe, Obed K, MD, 1 mg at 04/02/19 7121  Past Medical History:  Diagnosis Date  . Pregnant   . Scoliosis     Past Surgical History:  Procedure Laterality Date  . KIDNEY STONE SURGERY      Family History  Problem Relation Age of Onset  . Hypertension Mother   . Depression Mother   . Hyperlipidemia Father   . Diabetes Paternal Grandmother     Social History:  reports that she has been smoking cigarettes. She has never used smokeless tobacco. She reports current alcohol use. She reports current drug use. Drug: IV.   Blood pressure (!) 103/57, pulse 86, temperature 98 F (36.7 C), resp. rate 18, height 5' (1.524 m), weight 52 kg, SpO2 100 %, not currently breastfeeding. Physical exam  Left hand: Fingers are well-perfused normal capillary refill popliteal pulse.  Sensation is intact throughout.  She has limited range of motion due to pain.  The dorsal aspect of the hand is erythematous and  mildly swollen.  I cannot identify any fluctuance.  The erythema this morning is better than pictures from yesterday.  Results for orders placed or performed during the hospital encounter of 04/01/19 (from the past 48 hour(s))  Lactic acid, plasma     Status: None   Collection Time: 04/01/19  6:30 AM  Result Value Ref Range   Lactic Acid, Venous 0.9 0.5 - 1.9 mmol/L    Comment: Performed at [facility] Lab, [address] [place], [place] [address]  Comprehensive metabolic panel     Status: Abnormal   Collection Time: 04/01/19  6:30 AM  Result Value Ref Range   Sodium 136 135 - 145 mmol/L   Potassium 3.7 3.5 - 5.1 mmol/L   Chloride 102 98 - 111 mmol/L   CO2 21 (L) 22 - 32 mmol/L   Glucose, Bld 96 70 - 99 mg/dL    Comment: Glucose reference range applies only to samples taken after fasting for at least 8 hours.   BUN 10 6 - 20 mg/dL   Creatinine, Ser 3.25 0.44 - 1.00 mg/dL   Calcium 8.8 (L) 8.9 - 10.3 mg/dL   Total Protein 6.8 6.5 - 8.1 g/dL   Albumin 3.3 (L) 3.5 - 5.0 g/dL   AST 498 (H) 15 - 41 U/L   ALT 330 (H) 0 - 44 U/L   Alkaline Phosphatase 145 (H) 38 -  126 U/L   Total Bilirubin 2.1 (H) 0.3 - 1.2 mg/dL   GFR calc non Af Amer >60 >60 mL/min   GFR calc Af Amer >60 >60 mL/min   Anion gap 13 5 - 15    Comment: Performed at [facility] Lab, [address], [place], [place] [address]  CBC WITH DIFFERENTIAL     Status: None   Collection Time: 04/01/19  6:30 AM  Result Value Ref Range   WBC 8.1 4.0 - 10.5 K/uL   RBC 4.82 3.87 - 5.11 MIL/uL   Hemoglobin 13.2 12.0 - 15.0 g/dL   HCT 36.4 68.0 - 32.1 %   MCV 88.4 80.0 - 100.0 fL   MCH 27.4 26.0 - 34.0 pg   MCHC 31.0 30.0 - 36.0 g/dL   RDW 22.4 82.5 - 00.3 %   Platelets 266 150 - 400 K/uL   nRBC 0.0 0.0 - 0.2 %   Neutrophils Relative % 65 %   Neutro Abs 5.3 1.7 - 7.7 K/uL   Lymphocytes Relative 29 %   Lymphs Abs 2.3 0.7 - 4.0 K/uL   Monocytes Relative 5 %   Monocytes Absolute 0.4 0.1 - 1.0 K/uL   Eosinophils Relative 1  %   Eosinophils Absolute 0.1 0.0 - 0.5 K/uL   Basophils Relative 0 %   Basophils Absolute 0.0 0.0 - 0.1 K/uL   Immature Granulocytes 0 %   Abs Immature Granulocytes 0.02 0.00 - 0.07 K/uL    Comment: Performed at [facility] Lab, [address], [place], [place] [address]  APTT     Status: None   Collection Time: 04/01/19  6:30 AM  Result Value Ref Range   aPTT 36 24 - 36 seconds    Comment: Performed at [facility] Lab, [address] [place], [place] [address]  Protime-INR     Status: None   Collection Time: 04/01/19  6:30 AM  Result Value Ref Range   Prothrombin Time 12.5 11.4 - 15.2 seconds   INR 0.9 0.8 - 1.2    Comment: (NOTE) INR goal varies based on device and disease states. Performed at [facility] Lab, [address], [place], [place] [address]   Blood Culture (routine x 2)     Status: None (Preliminary result)   Collection Time: 04/01/19  6:30 AM   Specimen: BLOOD  Result Value Ref Range   Specimen Description BLOOD RIGHT ARM    Special Requests      BOTTLES DRAWN AEROBIC AND ANAEROBIC Blood Culture adequate volume Performed at [facility], [address], [place], [place] [address]    Culture NO GROWTH 1 DAY    Report Status PENDING   Blood Culture (routine x 2)     Status: None (Preliminary result)   Collection Time: 04/01/19  6:30 AM   Specimen: BLOOD  Result Value Ref Range   Specimen Description BLOOD RIGHT FOREARM    Special Requests      BOTTLES DRAWN AEROBIC AND ANAEROBIC Blood Culture adequate volume Performed at [facility] Lab, [address] [place], [place] [address]    Culture NO GROWTH 1 DAY    Report Status PENDING   I-Stat beta hCG blood, ED     Status: None   Collection Time: 04/01/19  6:43 AM  Result Value Ref Range   I-stat hCG, quantitative <5.0 <5 mIU/mL   Comment 3            Comment:  GEST. AGE      CONC.  (mIU/mL)   <=1 WEEK        5 - 50     2 WEEKS       50 - 500     3 WEEKS       100 - 10,000     4 WEEKS      1,000 - 30,000        FEMALE AND NON-PREGNANT FEMALE:     LESS THAN 5 mIU/mL   Lactic acid, plasma     Status: None   Collection Time: 04/01/19  9:38 AM  Result Value Ref Range   Lactic Acid, Venous 1.7 0.5 - 1.9 mmol/L    Comment: Performed at [facility], [address] [place], [place] [address]  Hepatitis B core antibody, total     Status: None   Collection Time: 04/01/19  9:38 AM  Result Value Ref Range   Hep B Core Total Ab NON REACTIVE NON REACTIVE    Comment: Performed at [facility], [address] [place], [place] [address]  Hepatitis B surface antigen     Status: None   Collection Time: 04/01/19  9:38 AM  Result Value Ref Range   Hepatitis B Surface Ag NON REACTIVE NON REACTIVE    Comment: Performed at [facility], [address] [place], [place] [address]  Hepatitis C antibody     Status: Abnormal   Collection Time: 04/01/19  9:38 AM  Result Value Ref Range   HCV Ab Reactive (A) NON REACTIVE    Comment: (NOTE) The CDC recommends that a Reactive HCV antibody result be followed up  with a HCV Nucleic Acid Amplification test. Performed at [facility], [address] [place], [place] [address]   Urinalysis, Routine w reflex microscopic     Status: Abnormal   Collection Time: 04/01/19 10:17 AM  Result Value Ref Range   Color, Urine AMBER (A) YELLOW    Comment: BIOCHEMICALS MAY BE AFFECTED BY COLOR   APPearance HAZY (A) CLEAR   Specific Gravity, Urine 1.029 1.005 - 1.030   pH 5.0 5.0 - 8.0   Glucose, UA NEGATIVE NEGATIVE mg/dL   Hgb urine dipstick NEGATIVE NEGATIVE   Bilirubin Urine SMALL (A) NEGATIVE   Ketones, ur NEGATIVE NEGATIVE mg/dL   Protein, ur NEGATIVE NEGATIVE mg/dL   Nitrite NEGATIVE NEGATIVE   Leukocytes,Ua SMALL (A) NEGATIVE   RBC / HPF 6-10 0 - 5 RBC/hpf   WBC, UA 11-20 0 - 5 WBC/hpf   Bacteria, UA RARE (A) NONE SEEN   Squamous Epithelial / LPF 0-5 0 - 5   Mucus PRESENT    Ca Oxalate Crys, UA PRESENT     Comment:  Performed at [facility], [address] [place], [place] [address]  Urine culture     Status: None   Collection Time: 04/01/19 10:17 AM   Specimen: In/Out Cath Urine  Result Value Ref Range   Specimen Description IN/OUT CATH URINE    Special Requests NONE    Culture      NO GROWTH Performed at [facility]  Lab, [address] [place], [place] [address]    Report Status 04/02/2019 FINAL   Urine rapid drug screen (hosp performed)     Status: Abnormal   Collection Time: 04/01/19 10:17 AM  Result Value Ref Range   Opiates POSITIVE (A) NONE DETECTED   Cocaine POSITIVE (A) NONE DETECTED  Benzodiazepines NONE DETECTED NONE DETECTED   Amphetamines POSITIVE (A) NONE DETECTED   Tetrahydrocannabinol POSITIVE (A) NONE DETECTED   Barbiturates NONE DETECTED NONE DETECTED    Comment: (NOTE) DRUG SCREEN FOR MEDICAL PURPOSES ONLY.  IF CONFIRMATION IS NEEDED FOR ANY PURPOSE, NOTIFY LAB WITHIN 5 DAYS. LOWEST DETECTABLE LIMITS FOR URINE DRUG SCREEN Drug Class                     Cutoff (ng/mL) Amphetamine and metabolites    1000 Barbiturate and metabolites    200 Benzodiazepine                 956 Tricyclics and metabolites     300 Opiates and metabolites        300 Cocaine and metabolites        300 THC                            50 Performed at [facility] Lab, [place] [address], [place], [place] [address]   HIV Antibody (routine testing w rflx)     Status: None   Collection Time: 04/01/19 11:01 AM  Result Value Ref Range   HIV Screen 4th Generation wRfx NON REACTIVE NON REACTIVE    Comment: Performed at [facility] [address], [place], [place] [address]  Bilirubin, fractionated(tot/dir/indir)     Status: Abnormal   Collection Time: 04/01/19 11:01 AM  Result Value Ref Range   Total Bilirubin 1.8 (H) 0.3 - 1.2 mg/dL   Bilirubin, Direct 0.9 (H) 0.0 - 0.2 mg/dL   Indirect Bilirubin 0.9 0.3 - 0.9 mg/dL    Comment: Performed at [facility] [address], [place], [place] [address]  Comprehensive metabolic panel     Status: Abnormal   Collection Time: 04/02/19  2:46 AM  Result Value Ref Range   Sodium 140 135 - 145 mmol/L   Potassium 3.8 3.5 - 5.1 mmol/L   Chloride 104 98 - 111 mmol/L   CO2 27 22 - 32 mmol/L   Glucose, Bld 112 (H) 70 - 99 mg/dL    Comment: Glucose reference range applies only to samples taken after fasting for at least 8 hours.   BUN 8 6 - 20 mg/dL   Creatinine, Ser 0.60 0.44 - 1.00 mg/dL   Calcium 8.7 (L) 8.9 - 10.3 mg/dL   Total Protein 6.4 (L) 6.5 - 8.1 g/dL   Albumin 3.0 (L) 3.5 - 5.0 g/dL   AST 169 (H) 15 - 41 U/L   ALT 287 (H) 0 - 44 U/L   Alkaline Phosphatase 133 (H) 38 - 126 U/L   Total Bilirubin 1.5 (H) 0.3 - 1.2 mg/dL   GFR calc non Af Amer >60 >60 mL/min   GFR calc Af Amer >60 >60 mL/min   Anion gap 9 5 - 15    Comment: Performed at [facility], [place] [address], [place], [place] [address]  CBC     Status: Abnormal   Collection Time: 04/02/19  2:46 AM  Result Value Ref Range   WBC 7.0 4.0 - 10.5 K/uL   RBC 4.18 3.87 - 5.11 MIL/uL   Hemoglobin 11.3 (L) 12.0 - 15.0 g/dL   HCT 36.6 36.0 - 46.0 %   MCV 87.6 80.0 - 100.0 fL   MCH 27.0 26.0 - 34.0 pg   MCHC 30.9 30.0 - 36.0 g/dL   RDW 15.1  11.5 - 15.5 %   Platelets 268 150 - 400 K/uL   nRBC 0.0 0.0 - 0.2 %    Comment: Performed at [facility] Lab, [address] [place], [place] [address]  Protime-INR     Status: None   Collection Time: 04/02/19  2:46 AM  Result Value Ref Range   Prothrombin Time 12.3 11.4 - 15.2 seconds   INR 0.9 0.8 - 1.2    Comment: (NOTE) INR goal varies based on device and disease states. Performed at [facility], [address] [place], [place] [address]     DG Hand Complete Left  Result Date: 04/01/2019 CLINICAL DATA:  Swelling at IV injection site of hand. EXAM: LEFT HAND - COMPLETE 3+ VIEW COMPARISON:  10/29/2018 FINDINGS: Dorsal soft tissue swelling . No evidence of fracture, erosion, or malalignment.  Remote fourth proximal phalanx head fracture. IMPRESSION: Soft tissue swelling without acute osseous finding or opaque foreign body. Electronically Signed   By: Marnee Spring M.D.   On: 04/01/2019 07:02    Assessment/Plan: Patient presents with what looks like left hand cellulitis.  We will treat her nonoperatively for now.  Please make her n.p.o. at midnight and I will check her again tomorrow morning to make sure this is not getting any worse.  Hopefully this will resolve with antibiotics.  Would encourage her to elevate the hand as much as possible to reduce swelling.  Call with any questions or concerns.  Allena Napoleon 04/02/2019, 10:24 AM

## 2019-04-02 NOTE — Progress Notes (Signed)
Boyfriend came up to hospital to visit and bring her some things- security had noted that he was under the influence- and had escorted him up.  Made action plan with security that al visitors should be banned from this point forward- for security reasons- he did search the bag in room in front of pt. nothing found- boyfriend's presence also seemed to aggitatet her-she has been calm and cooperative since his departure

## 2019-04-02 NOTE — Progress Notes (Signed)
Pharmacy Antibiotic Note  Summer Cain is a 34 y.o. female with h/o IVDU admitted on 04/01/2019 with injection site cellulitis.  Pharmacy has been consulted for vancomycin dosing. Patient received vancomycin 1000mg  on 3/14 at ~0700.   Plan: Vancomycin 1250mg  IV q24h for expected AUC 512, with Scr of 0.8 used.  Monitor clinical course, LOT, and deescalation.  Levels as needed.   Height: 5' (152.4 cm) Weight: 114 lb 10.2 oz (52 kg) IBW/kg (Calculated) : 45.5  Temp (24hrs), Avg:98 F (36.7 C), Min:97.9 F (36.6 C), Max:98 F (36.7 C)  Recent Labs  Lab 04/01/19 0630 04/01/19 0938 04/02/19 0246  WBC 8.1  --  7.0  CREATININE 0.57  --  0.60  LATICACIDVEN 0.9 1.7  --     Estimated Creatinine Clearance: 73.2 mL/min (by C-G formula based on SCr of 0.6 mg/dL).    Allergies  Allergen Reactions  . Latex Swelling    Antimicrobials this admission: 3/14 ceftriaxone >>  3/14 Vancomycin >>   Summer Cain A. 4/14, PharmD, BCPS, FNKF Clinical Pharmacist [place] Please utilize Amion for appropriate phone number to reach the unit pharmacist [facility])     04/02/2019 7:34 PM

## 2019-04-02 NOTE — Progress Notes (Addendum)
Subjective: HD#1 Events Overnight: Patient's partner came to the room last night.  Apparently he was found to be intoxicated.  Partner now banned from patient's room.  Patient was seen this morning on rounds.  Patient was upset because she had to get a right upper quadrant [place] as she would not be able to finish her breakfast.  Patient was counseled on her current infection and her acute hepatitis due to hepatitis C.  We told her that we need the right upper quadrant ultrasound to see if there are any signs of cirrhosis.  Patient admits understanding and all questions were thoroughly answered.  She admits to pain in her left hand with some small bits of clear exudate coming from the dorsal aspect of her left hand.  Objective:  Vital signs in last 24 hours: Vitals:   04/01/19 1515 04/01/19 1631 04/01/19 1927 04/02/19 0355  BP: 102/78 106/77 104/84 (!) 148/66  Pulse: 82 83 85 70  Resp:  18 16 15   Temp:  98.1 F (36.7 C) 98.3 F (36.8 C) 97.9 F (36.6 C)  TempSrc:  Oral Oral Oral  SpO2: 100% 100% 94% 100%  Weight:      Height:       Supplemental O2: RA COWS: 1, 7   Physical Exam: Physical Exam  Constitutional: She is oriented to person, place, and time.  HENT:  Head: Normocephalic and atraumatic.  Eyes: EOM are normal.  Cardiovascular: Normal rate and intact distal pulses.  No murmur heard. Pulmonary/Chest: Effort normal and breath sounds normal. No respiratory distress. She has no wheezes.  Abdominal: Soft. She exhibits no distension. There is abdominal tenderness (diffuse).  Musculoskeletal:        General: No tenderness or edema. Normal range of motion.     Cervical back: Normal range of motion.  Neurological: She is alert and oriented to person, place, and time.  Skin: Skin is warm and dry. She is not diaphoretic.  Patient's cellulitis appears to be mildly improved from yesterday.  Psychiatric: Her mood appears anxious. Her affect is labile. She is agitated. She expresses  impulsivity.    Filed Weights   04/01/19 0810  Weight: 52 kg     Intake/Output Summary (Last 24 hours) at 04/02/2019 0557 Last data filed at 04/01/2019 1927 Gross per 24 hour  Intake 1680 ml  Output --  Net 1680 ml    Risk Score: N/A  Pertinent labs/Imaging: CBC Latest Ref Rng & Units 04/02/2019 04/01/2019 05/22/2018  WBC 4.0 - 10.5 K/uL 7.0 8.1 8.2  Hemoglobin 12.0 - 15.0 g/dL 11.3(L) 13.2 10.9(L)  Hematocrit 36.0 - 46.0 % 36.6 42.6 32.6(L)  Platelets 150 - 400 K/uL 268 266 145(L)    CMP Latest Ref Rng & Units 04/02/2019 04/01/2019 04/01/2019  Glucose 70 - 99 mg/dL 112(H) - 96  BUN 6 - 20 mg/dL 8 - 10  Creatinine 0.44 - 1.00 mg/dL 0.60 - 0.57  Sodium 135 - 145 mmol/L 140 - 136  Potassium 3.5 - 5.1 mmol/L 3.8 - 3.7  Chloride 98 - 111 mmol/L 104 - 102  CO2 22 - 32 mmol/L 27 - 21(L)  Calcium 8.9 - 10.3 mg/dL 8.7(L) - 8.8(L)  Total Protein 6.5 - 8.1 g/dL 6.4(L) - 6.8  Total Bilirubin 0.3 - 1.2 mg/dL 1.5(H) 1.8(H) 2.1(H)  Alkaline Phos 38 - 126 U/L 133(H) - 145(H)  AST 15 - 41 U/L 169(H) - 197(H)  ALT 0 - 44 U/L 287(H) - 330(H)    HCV RNA: pending  Assessment/Plan:  Principal Problem:   Non-Purulent Cellulitis of left hand Active Problems:   Hepatitis   Substance use disorder   Bacteriuria with pyuria   Hepatitis C    Patient Summary: Summer Cain is a 34 y.o. with pertinent PMH of  substance use disorder (EtOH, cannabis, cocaine) and  bipolar disorder  who presented with left dorsal hand erythema and edema  and admit for nonpurulent cellulitis  on hospital day 1   #Nonpurulent cellulitis, left Dorsal Hand Patient states that she still continues to have hand pain but overall feels well.  She believes that there is some clear exudate draining from her left dorsal hand.  On examination she does not appear to have any fluctuating masses of her hands turning for abscess.  She was reassured that we are giving her antibiotics to treat the infection on her hand and  possible infections in her blood.  Cultures are still pending. -Patient is on day 2 of antibiotic therapy and tolerating it well and some improvement of her symptoms.   - I will add vancomycin today due to persistent swelling and erythema.  -Continue ceftriaxone 1 g every 24 hours for 5 to 10 days.  - We will switch oral therapy if cultures are negative. - Blood cultures pending -Hand surgery consulted in the ED.  Pending recommendations.  #Acute Hepatitis #HCV Patient is positive for hepatitis C. HCV RNA level pending.  Patient was counseled on her HCV infection and informed that she will need to follow-up with infectious disease to initiate therapy.  We will get a right upper quadrant ultrasound to look for signs and symptoms of cirrhosis.   -Right upper quadrant ultrasound.  #Polysubstance-use Disorder:  Patient had elevated Scores overnight with concerns for opioid withdrawal.  On evaluation today the patient is appears to be doing well with minimal signs and symptoms of opiate withdrawal.  We are treating her pain and withdrawal symptoms with 1 mg IV Dilaudid every 4 hours as needed. -Continue COWS protocol -Continue Dilaudid 1 mg every 4 hours as needed for pain/withdrawal   Diet: Normal IVF: None,None VTE: Enoxaparin Code: Full PT/OT recs: None TOC recs: None  Dispo: Anticipated discharge 1-2 days.    Dellia Cloud, D.O. MCIMTP, PGY-1 Date 04/02/2019 Time 5:57 AM

## 2019-04-03 DIAGNOSIS — F1190 Opioid use, unspecified, uncomplicated: Secondary | ICD-10-CM

## 2019-04-03 DIAGNOSIS — D509 Iron deficiency anemia, unspecified: Secondary | ICD-10-CM

## 2019-04-03 DIAGNOSIS — I809 Phlebitis and thrombophlebitis of unspecified site: Secondary | ICD-10-CM

## 2019-04-03 DIAGNOSIS — B1920 Unspecified viral hepatitis C without hepatic coma: Secondary | ICD-10-CM

## 2019-04-03 DIAGNOSIS — Z5329 Procedure and treatment not carried out because of patient's decision for other reasons: Secondary | ICD-10-CM

## 2019-04-03 DIAGNOSIS — L03114 Cellulitis of left upper limb: Secondary | ICD-10-CM

## 2019-04-03 LAB — SURGICAL PCR SCREEN
MRSA, PCR: POSITIVE — AB
Staphylococcus aureus: POSITIVE — AB

## 2019-04-03 LAB — COMPREHENSIVE METABOLIC PANEL
ALT: 238 U/L — ABNORMAL HIGH (ref 0–44)
AST: 138 U/L — ABNORMAL HIGH (ref 15–41)
Albumin: 2.5 g/dL — ABNORMAL LOW (ref 3.5–5.0)
Alkaline Phosphatase: 119 U/L (ref 38–126)
Anion gap: 11 (ref 5–15)
BUN: 6 mg/dL (ref 6–20)
CO2: 27 mmol/L (ref 22–32)
Calcium: 8.7 mg/dL — ABNORMAL LOW (ref 8.9–10.3)
Chloride: 103 mmol/L (ref 98–111)
Creatinine, Ser: 0.63 mg/dL (ref 0.44–1.00)
GFR calc Af Amer: >60 mL/min (ref >60)
GFR calc non Af Amer: >60 mL/min (ref >60)
Glucose, Bld: 100 mg/dL — ABNORMAL HIGH (ref 70–99)
Potassium: 4.1 mmol/L (ref 3.5–5.1)
Sodium: 141 mmol/L (ref 135–145)
Total Bilirubin: 1.2 mg/dL (ref 0.3–1.2)
Total Protein: 5.8 g/dL — ABNORMAL LOW (ref 6.5–8.1)

## 2019-04-03 LAB — CBC
HCT: 38.1 % (ref 36.0–46.0)
Hemoglobin: 11.6 g/dL — ABNORMAL LOW (ref 12.0–15.0)
MCH: 27.4 pg (ref 26.0–34.0)
MCHC: 30.4 g/dL (ref 30.0–36.0)
MCV: 89.9 fL (ref 80.0–100.0)
Platelets: 275 10*3/uL (ref 150–400)
RBC: 4.24 MIL/uL (ref 3.87–5.11)
RDW: 15.1 % (ref 11.5–15.5)
WBC: 6.1 10*3/uL (ref 4.0–10.5)
nRBC: 0 % (ref 0.0–0.2)

## 2019-04-03 LAB — VITAMIN B12: Vitamin B-12: 1453 pg/mL — ABNORMAL HIGH (ref 180–914)

## 2019-04-03 LAB — FOLATE: Folate: 14.8 ng/mL (ref >5.9)

## 2019-04-03 LAB — IRON AND TIBC: Iron: <5 ug/dL — ABNORMAL LOW (ref 28–170)

## 2019-04-03 LAB — SARS CORONAVIRUS 2 BY RT PCR (DIASORIN): SARS Coronavirus 2: NEGATIVE

## 2019-04-03 LAB — RETICULOCYTES
Immature Retic Fract: 10 % (ref 2.3–15.9)
RBC.: 4 MIL/uL (ref 3.87–5.11)
Retic Count, Absolute: 50.4 10*3/uL (ref 19.0–186.0)
Retic Ct Pct: 1.3 % (ref 0.4–3.1)

## 2019-04-03 LAB — PROTIME-INR
INR: 0.9 (ref 0.8–1.2)
Prothrombin Time: 12.1 seconds (ref 11.4–15.2)

## 2019-04-03 LAB — FERRITIN: Ferritin: 68 ng/mL (ref 11–307)

## 2019-04-03 LAB — HEPATITIS B SURFACE ANTIBODY, QUANTITATIVE: Hep B S AB Quant (Post): 4.7 m[IU]/mL — ABNORMAL LOW (ref >9.9)

## 2019-04-03 MED ORDER — LOPERAMIDE HCL 2 MG PO CAPS: 2.0000 mg | ORAL_CAPSULE | ORAL | Status: DC | PRN

## 2019-04-03 MED ORDER — METHOCARBAMOL 500 MG PO TABS: 500.0000 mg | ORAL_TABLET | Freq: Three times a day (TID) | ORAL | Status: DC | PRN

## 2019-04-03 MED ORDER — CHLORHEXIDINE GLUCONATE CLOTH 2 % EX PADS: 6.0000 | MEDICATED_PAD | Freq: Every day | CUTANEOUS | Status: DC

## 2019-04-03 MED ORDER — MUPIROCIN 2 % EX OINT
1.0000 "application " | TOPICAL_OINTMENT | Freq: Two times a day (BID) | CUTANEOUS | Status: DC
  Administered 2019-04-03: 1 via NASAL
  Filled 2019-04-03: qty 22

## 2019-04-03 MED ORDER — ONDANSETRON 4 MG PO TBDP: 4.0000 mg | ORAL_TABLET | Freq: Four times a day (QID) | ORAL | Status: DC | PRN

## 2019-04-03 MED ORDER — HYDROMORPHONE HCL 2 MG PO TABS
2.0000 mg | ORAL_TABLET | ORAL | Status: DC | PRN
  Administered 2019-04-03: 15:00:00 2 mg via ORAL
  Filled 2019-04-03 (×2): qty 1

## 2019-04-03 MED ORDER — DICYCLOMINE HCL 20 MG PO TABS
20.0000 mg | ORAL_TABLET | Freq: Four times a day (QID) | ORAL | Status: DC | PRN
  Filled 2019-04-03: qty 1

## 2019-04-03 MED ORDER — HYDROXYZINE HCL 25 MG PO TABS: 25.0000 mg | ORAL_TABLET | Freq: Four times a day (QID) | ORAL | Status: DC | PRN

## 2019-04-03 MED ORDER — NAPROXEN 250 MG PO TABS
500.0000 mg | ORAL_TABLET | Freq: Two times a day (BID) | ORAL | Status: DC | PRN
  Administered 2019-04-03: 500 mg via ORAL
  Filled 2019-04-03 (×2): qty 2

## 2019-04-03 MED ORDER — SULFAMETHOXAZOLE-TRIMETHOPRIM 800-160 MG PO TABS
1.0000 | ORAL_TABLET | Freq: Two times a day (BID) | ORAL | Status: DC
  Administered 2019-04-03: 1 via ORAL
  Filled 2019-04-03: qty 1

## 2019-04-03 NOTE — Plan of Care (Signed)

## 2019-04-03 NOTE — Progress Notes (Signed)
IV infiltrated, removed with tip intact. Patient screaming and cussing trying to rip out IV prior to me removing. Ice pack given but refused and threw it. IV team consulted, patient screamed cursed and crying loudly. MD paged to inform him, IV team recommends PICC.

## 2019-04-03 NOTE — Progress Notes (Signed)
Subjective: No acute overnight events. Pain in left hand improving.  White blood cell this a.m. 6.1. No overnight fevers. Objective: Vital signs in last 24 hours: Temp:  [98 F (36.7 C)] 98 F (36.7 C) (03/15 1242) Pulse Rate:  [52-84] 59 (03/16 0305) Resp:  [14-17] 17 (03/16 0305) BP: (86-110)/(52-74) 94/60 (03/16 0305) SpO2:  [97 %-100 %] 99 % (03/16 0305) Last BM Date: 03/31/19  Intake/Output from previous day: No intake/output data recorded. Intake/Output this shift: No intake/output data recorded.  General appearance: alert, cooperative, no distress and Sitting up in bed, primary team in room.   Resp: Unlabored Pulses: Left radial pulse 2+ Skin: Left hand cellulitis, decreased swelling of dorsal aspect from yesterday, improved range of motion, but still limited.  Tenderness has decreased with palpation.  Fingers are well-perfused with normal capillary refill, normal sensation.  No identifiable fluctuance or fluid collection noted.  Multiple abrasions, scabs noted over left arm.    Lab Results:  CBC Latest Ref Rng & Units 04/03/2019 04/02/2019 04/01/2019  WBC 4.0 - 10.5 K/uL 6.1 7.0 8.1  Hemoglobin 12.0 - 15.0 g/dL 11.6(L) 11.3(L) 13.2  Hematocrit 36.0 - 46.0 % 38.1 36.6 42.6  Platelets 150 - 400 K/uL 275 268 266    BMET Recent Labs    04/02/19 0246 04/03/19 0407  NA 140 141  K 3.8 4.1  CL 104 103  CO2 27 27  GLUCOSE 112* 100*  BUN 8 6  CREATININE 0.60 0.63  CALCIUM 8.7* 8.7*   PT/INR Recent Labs    04/02/19 0246 04/03/19 0407  LABPROT 12.3 12.1  INR 0.9 0.9   ABG No results for input(s): PHART, HCO3 in the last 72 hours.  Invalid input(s): PCO2, PO2  Studies/Results: US Abdomen Limited  Result Date: 04/02/2019 CLINICAL DATA:  Mid abdominal cramping EXAM: ULTRASOUND ABDOMEN LIMITED RIGHT UPPER QUADRANT COMPARISON:  None. FINDINGS: Gallbladder: Contracted. No shadowing stones. Increased wall thickness up to 4.2 mm. Negative sonographic Murphy.  Common bile duct: Diameter: 3.9 mm Liver: No focal lesion identified. Within normal limits in parenchymal echogenicity. Portal vein is patent on color Doppler imaging with normal direction of blood flow towards the liver. Other: None. IMPRESSION: 1. Contracted appearing gallbladder with slight increased wall thickness, suspect largely due to contraction. No other sonographic features suspicious for acute gallbladder disease 2. Otherwise negative right upper quadrant abdominal ultrasound Electronically Signed   By: Donavan Foil M.D.   On: 04/02/2019 20:23    Anti-infectives: Anti-infectives (From admission, onward)   Start     Dose/Rate Route Frequency Ordered Stop   04/02/19 1945  vancomycin (VANCOREADY) IVPB 1250 mg/250 mL     1,250 mg 166.7 mL/hr over 90 Minutes Intravenous Every 24 hours 04/02/19 1932     04/01/19 1122  cefTRIAXone (ROCEPHIN) 1 g in sodium chloride 0.9 % 100 mL IVPB     1 g 200 mL/hr over 30 Minutes Intravenous Every 24 hours 04/01/19 1112     04/01/19 0615  vancomycin (VANCOCIN) IVPB 1000 mg/200 mL premix     1,000 mg 200 mL/hr over 60 Minutes Intravenous  Once 04/01/19 0612 04/01/19 0755      Assessment/Plan: Left hand cellulitis:  No drainable fluid collection noted today on exam.  Erythema is improving.  Range of motion is improving.  Pain improving.   No surgical intervention planned at this time. Continue to elevate hand as much as possible to reduce swelling.  Spoke with primary team this a.m., planning for 1 more day IV  antibiotics then transition to p.o.  Call with any question concerns.  We will continue to follow and monitor. Patient can eat a normal diet now, no longer n.p.o.    LOS: 2 days    Leslee Home, PA-C 04/03/2019

## 2019-04-03 NOTE — Consult Note (Signed)
  Patient is seen and examined.  Patient is a 34 year old female with a past psychiatric history significant for polysubstance dependence.  Consultation was requested.  Concern for underlying bipolar disorder.  Patient stated she had lost her IV this a.m. and "dope sick".  She states she needed her pain meds.  I reviewed the electronic medical record, and clearly her primary diagnosis is polysubstance dependence.  She has been treated with several medications in the past.  This included Remeron, Suboxone, Seroquel, Abilify and Trileptal.  At this point and review of previous psychiatric evaluations I think it is primarily polysubstance dependence.  We will attempt to see her later when her withdrawal symptoms are under better control.  Given her infection I will go on and write for the opiate detox protocol per the behavioral health hospital.  That would not include any opiate blocking agents at this point.  Hopefully we will be able to evaluate her more fully later.

## 2019-04-03 NOTE — Progress Notes (Signed)
04/02/2019 2240- Found pt in the BR, smelled like smoke in the room. Pt denied smoking, stated she was washing her clothes in the BR and they must have had smoke on them. When pt back to bed, patient became extremely drowsy, falling asleep sitting up with head falling down in the bed. She woke up enough to respond when spoken to, but continued this same behavior while nurse gave HS meds.  04/02/2019-2245- Pt sound asleep.  04/03/2019-0330- please see progress note entry. Pt was wide awake during this episode.    04/03/2019- pt received prn hydromorphone @ 0347.    04/03/2019 0630. Pt appears "strung out" with eyes slightly closed and rolling back, but easily aroused. Pt denies having drugs or using drugs at the bedside. She refused to have nurse check her nightstand drawer and belongings. Report given to oncoming nurse who will continue to monitor pt.

## 2019-04-03 NOTE — Progress Notes (Addendum)
Notified Dr Darl Pikes, on call for IM that pt has not had a COVID test for this admission. Requested order to obtain COVID test.   Called lab to confirm COVID test not done. Lab stated there is no order for COVID test.

## 2019-04-03 NOTE — Progress Notes (Addendum)
Patient walked out of room with bags packed and in hands. RN asked where patient was going and she stated that she had to go home to get her kids because the caregiver had to leave and go to work. MD notified & heard patients reasoning. MD called back to state that he would come up and assess patient, but patient was in a rush stating that her ride is going to leave her, so she left before MD could come speak with her. AMA paper signed and placed in chart.

## 2019-04-03 NOTE — Progress Notes (Signed)
MD returned call , informed of IV not able to obtain. MD will be up to speak with patient.

## 2019-04-03 NOTE — Plan of Care (Signed)

## 2019-04-03 NOTE — Progress Notes (Addendum)
Received page from RN Ashely Nevius at [phone] that patient was leaving AMA due to not having child support for her children and that the current caregiver watching her children was planning on leaving her infant with her 32 year old to go to work. As the team was en route to the patient's room to facilitated a discharge, they had been notified that the patient had already left her room.  Dolan Amen, MD IMTS, PGY-1 04/03/2019,9:44 PM

## 2019-04-03 NOTE — Progress Notes (Signed)
Patient stated "it hurts" when RN attempted to do COVID test. Pt wanted a different swab to be done and nurse explained to pt that this was the one ordered by the Dr. She stated "they didn't give me this one at [facility] hospital" Explained to pt that this was the one ordered and must be done. Also informed pt that the COVID test was not done upon admission to the hospital and every pt had to have a COVID test for safety of the patient and others. Pt became very rude and yelling. I requested charge nurse Lauren to assist. Pt continued to be rude and loud, fussing and cussing to this RN and stated she wanted me to leave and did not want me to be her nurse. and started calling this RN names-"bitch". This RN informed pt that she could not talk to me this way. Charge nurse obtained the COVID test and talked to the patient. The COVID test swab was taken to the lab by this RN. RN will continue to monitor.

## 2019-04-03 NOTE — Progress Notes (Addendum)
Subjective: HD#2 Events Overnight: Patient continue to decline COVID testing.  Patient was seen this morning on rounds. Patient was resting comfortably in bed.  She states that she is still having some nausea and feeling flushed/hot flashes.  Otherwise she states that her pain in her left hand is well controlled.  She does feel like she has some swelling and pain on her right forearm where her previous IV was.  Patient does admit to some loose stools.  Patient does admit to diffuse pruritus.  Otherwise she denies chest pain, shortness breath, abdominal pain, headaches, weakness, tremors, constipation.  Objective:  Vital signs in last 24 hours: Vitals:   04/02/19 1242 04/02/19 1737 04/02/19 1958 04/03/19 0305  BP: (!) 86/52 (!) 87/55 110/74 94/60  Pulse: 60 (!) 52 84 (!) 59  Resp: 14  17 17   Temp: 98 F (36.7 C)     TempSrc: Oral     SpO2: 97% 98% 100% 99%  Weight:      Height:       Supplemental O2: RA COWS score: 6 which is been consistent since her hospitalization  Physical Exam: Physical Exam  Constitutional: No distress.  HENT:  Head: Normocephalic and atraumatic.  Eyes: EOM are normal.  Cardiovascular: Normal rate and intact distal pulses.  No murmur heard. Pulmonary/Chest: Effort normal and breath sounds normal.  Abdominal: Soft. She exhibits no distension. There is no abdominal tenderness.  Musculoskeletal:        General: No edema. Normal range of motion.     Cervical back: Normal range of motion.  Neurological: She is alert.  Skin: Skin is warm. She is not diaphoretic.  Right forearm had some edema without erythema surrounding.  Patient does admit to pain in this area.    Filed Weights   04/01/19 0810  Weight: 52 kg    No intake or output data in the 24 hours ending 04/03/19 0631  Risk Score:  N/A  Pertinent labs/Imaging: CBC Latest Ref Rng & Units 04/03/2019 04/02/2019 04/01/2019  WBC 4.0 - 10.5 K/uL 6.1 7.0 8.1  Hemoglobin 12.0 - 15.0 g/dL 11.6(L)  11.3(L) 13.2  Hematocrit 36.0 - 46.0 % 38.1 36.6 42.6  Platelets 150 - 400 K/uL 275 268 266    CMP Latest Ref Rng & Units 04/03/2019 04/02/2019 04/01/2019  Glucose 70 - 99 mg/dL 04/03/2019) 578(I) -  BUN 6 - 20 mg/dL 6 8 -  Creatinine 696(E - 1.00 mg/dL 9.52 8.41 -  Sodium 3.24 - 145 mmol/L 141 140 -  Potassium 3.5 - 5.1 mmol/L 4.1 3.8 -  Chloride 98 - 111 mmol/L 103 104 -  CO2 22 - 32 mmol/L 27 27 -  Calcium 8.9 - 10.3 mg/dL 401) 0.2(V) -  Total Protein 6.5 - 8.1 g/dL 2.5(D) 6.4(L) -  Total Bilirubin 0.3 - 1.2 mg/dL 1.2 6.6(Y) 4.0(H)  Alkaline Phos 38 - 126 U/L 119 133(H) -  AST 15 - 41 U/L 138(H) 169(H) -  ALT 0 - 44 U/L 238(H) 287(H) -   HCV: - Quantitative: 213,000 - Quant Log: 5.328  4.7(Q Abdomen: 1. Contracted appearing gallbladder with slight increased wall thickness, suspect largely due to contraction. No other sonographic features suspicious for acute gallbladder disease  2. Otherwise negative right upper quadrant abdominal ultrasound   Assessment/Plan:  Principal Problem:   Non-Purulent Cellulitis of left hand Active Problems:   Hepatitis   Substance use disorder   Bacteriuria with pyuria   Hepatitis C    Patient Summary: Summer Cain  is a 34 y.o. with pertinent PMH of substance use disorder(EtOH, cannabis, cocaine),bipolar affective disorder, hx of MRSA cellulitis  who presented with left dorsal hand erythema and edemaand admit for nonpurulent cellulitis on hospital day 2  #Nonpurulent cellulitis,left Dorsal Hand Patient is on day 3 of IV vancomycin and ceftriaxone and tolerating it well. Hand surgery will be by today to re-evaluate her need for hand surgery due to decreased range of motion of her hand.  Blood cultures are negative at 2 days.  Considering her improvement on IV antibiotics, the surgeon does not believe she will need surgery.  Otherwise, her pain is well controlled on IV Dilaudid - Continue Vancomycin and ceftriaxone 1 g IV daily - Continue IV  dilaudid 1 mg every 4 hours as needed for pain -We will switch to oral antibiotics for the remainder of her treatment.  She will need antibiotic therapy for roughly 5 to 10 days depending on clinical improvement.  #Thrombophlebitis: Patient is experiencing pain and swelling of the right forearm where the IV site was originally placed can [place] to thrombophlebitis.  Her pain is being managed with IV Dilaudid every 4 hours. Please continue to use warm compresses as needed. -Continue warm compresses as needed.  #Acute Hepatitis #HCV HCV quant was positive and Summer abdomen shows no signs of cholestasis or chronic liver disease.  -Patient will need to be set up with outpatient infectious disease for treatment of her HCV.  #Polysubstance-use Disorder: I counseled the patient on opioid use disorder treatment.  She was agreeable to starting Suboxone and following up at the opioid use disorder clinic at [facility] internal medicine clinic we will keep her on Dilaudid today for acute pain control and transition to Suboxone tomorrow. - Continue COWS. COWS score of 6 overnight  - Continue IV dilaudid q 4 hours for withdrawal   #Bipolar Disorder: Patient was previously hospitalized at [place] for MRSA infection of her hand with abscess.  Hospitalization the patient was seen by inpatient psychiatry and was started on Lexapro 20 mg, Seroquel 200 mg nightly, trazodone 100 mg nightly, and Atarax 50 mg as needed for anxiety and depression.  She was previously hospitalized for behavioral health issues on 05/2018 and diagnosed with bipolar affective disorder.  At that time she was started on Abilify and oxcarbazepine and set up with an IUD clinic for Suboxone therapy.  Patient is no longer taking any of the psychiatric medications and does not follow up with an outpatient psychiatrist.  Clinically, she does appear to be manic/hypomanic.  We consulted psychiatry yesterday for reevaluation and recommendations for  medication management/outpatient follow-up. -Psychiatry recommendations pending  Diet: NPO IVF: None,None VTE: Enoxaparin Code: Full PT/OT recs: None TOC recs: none   Dispo: Anticipated discharge 1-2 days.    Marianna Payment, D.O. [facility], PGY-1 Date 04/03/2019 Time 6:31 AM

## 2019-04-06 LAB — CULTURE, BLOOD (ROUTINE X 2)
Culture: NO GROWTH
Culture: NO GROWTH
Special Requests: ADEQUATE
Special Requests: ADEQUATE

## 2019-04-09 NOTE — Discharge Summary (Signed)
Name: Summer Cain MRN: [id number] DOB: Aug 20, 1985 34 y.o. PCP: Patient, No Pcp Per  Date of Admission: 04/01/2019  5:13 AM Date of Discharge: 04/03/2019 Attending Physician: Dr. Evette Doffing   Discharge Diagnosis: 1.  Cellulitis of the left hand 2.  Opioid use disorder 3.  Hepatitis C infection  Discharge Medications: Allergies as of 04/03/2019      Reactions   Latex Swelling      Medication List    You have not been prescribed any medications.     Disposition and follow-up:   Summer Cain was discharged from [facility] in [place] condition.  At the hospital follow up visit please address:  1.  Cellulitis of the left hand: Unfortunately we were unable to reach her after several attempts to prescribe Bactrim  Opioid use disorder: We would have loved to start her on Suboxone however unfortunately left the hospital  Hepatitis C infection: Requires treatment  Iron deficiency anemia: She was found to have iron level<5 and ferritin at 68.  Requires iron supplements  2.  Labs / imaging needed at time of follow-up: CBC  3.  Pending labs/ test needing follow-up: None  Follow-up Appointments:   Hospital Course by problem list: 1.  Cellulitis of the left hand: Summer Cain is a 33 year old person who is living with opioid use disorder who presented to [facility] on April 01, 2019 with a 4-day history of left hand swelling and pain.  This was in the setting of recent heroin injection.  She reported of subjective fevers with T-max of 102 F.  She was noted to have swelling, pain, erythema and warmness of the dorsal aspect of her left hand.  She was subsequently admitted and treated with IV antibiotics (ceftriaxone and vancomycin).  Her clinical course was unimpressive and she had good response to her IV antibiotics.  She was also evaluated by our plastic surgery colleagues due to the location of her cellulitis, and they had recommended medical therapy.  Our  goal was to transition to oral antibiotics once she was optimized.  She unfortunately left AGAINST MEDICAL ADVICE the morning of April 03, 2019.  We were not able to reach her to prescribe her oral antibiotics  2.  Opioid use disorder: Our goal was to begin Suboxone during this hospitalization as she reports that she had tried Suboxone in the past however we were unable to accomplish this goal   3.  Hepatitis C infection: She was found to have a positive hepatitis C antibody with viral load of 213,000.  An abdominal ultrasound was performed which did not show any evidence of cirrhosis.  Her liver enzymes remained stable with AST in the 130s-160s, ALT in the 230s-280s.  INR and total bilirubin also improved at 1.8>> 1.5>> 1.2  Discharge Vitals:   BP 111/69 (BP Location: Right Arm)   Pulse 73   Temp 97.8 F (36.6 C) (Oral)   Resp 18   Ht 5' (1.524 m)   Wt 52 kg   LMP  (LMP Unknown)   SpO2 100%   BMI 22.39 kg/m   Pertinent Labs, Studies, and Procedures:  CMP Latest Ref Rng & Units 04/03/2019 04/02/2019 04/01/2019  Glucose 70 - 99 mg/dL 100(H) 112(H) -  BUN 6 - 20 mg/dL 6 8 -  Creatinine 0.44 - 1.00 mg/dL 0.63 0.60 -  Sodium 135 - 145 mmol/L 141 140 -  Potassium 3.5 - 5.1 mmol/L 4.1 3.8 -  Chloride 98 - 111 mmol/L 103  104 -  CO2 22 - 32 mmol/L 27 27 -  Calcium 8.9 - 10.3 mg/dL 6.7(E) 9.3(Y) -  Total Protein 6.5 - 8.1 g/dL 1.0(F) 6.4(L) -  Total Bilirubin 0.3 - 1.2 mg/dL 1.2 7.5(Z) 0.2(H)  Alkaline Phos 38 - 126 U/L 119 133(H) -  AST 15 - 41 U/L 138(H) 169(H) -  ALT 0 - 44 U/L 238(H) 287(H) -    ULTRASOUND ABDOMEN LIMITED RIGHT UPPER QUADRANT  COMPARISON:  None.  FINDINGS: Gallbladder:  Contracted. No shadowing stones. Increased wall thickness up to 4.2 mm. Negative sonographic Murphy.  Common bile duct:  Diameter: 3.9 mm  Liver:  No focal lesion identified. Within normal limits in parenchymal echogenicity. Portal vein is patent on color Doppler imaging  with normal direction of blood flow towards the liver.  Other: None.  IMPRESSION: 1. Contracted appearing gallbladder with slight increased wall thickness, suspect largely due to contraction. No other sonographic features suspicious for acute gallbladder disease 2. Otherwise negative right upper quadrant abdominal ultrasound  Discharge Instructions:   Signed: Yvette Rack, MD 04/09/2019, 3:36 PM   Pager: [phone] Internal Medicine Teaching Service

## 2019-08-10 ENCOUNTER — Emergency Department (HOSPITAL_COMMUNITY): Payer: Medicaid Other

## 2019-08-10 ENCOUNTER — Other Ambulatory Visit: Payer: Self-pay

## 2019-08-10 ENCOUNTER — Inpatient Hospital Stay (HOSPITAL_COMMUNITY)
Admission: EM | Admit: 2019-08-10 | Discharge: 2019-08-13 | DRG: 872 | Discharge status: Against Medical Advice | Payer: Medicaid Other | Attending: Family Medicine | Admitting: Family Medicine

## 2019-08-10 DIAGNOSIS — A419 Sepsis, unspecified organism: Secondary | ICD-10-CM

## 2019-08-10 DIAGNOSIS — M25579 Pain in unspecified ankle and joints of unspecified foot: Secondary | ICD-10-CM

## 2019-08-10 DIAGNOSIS — A4102 Sepsis due to Methicillin resistant Staphylococcus aureus: Principal | ICD-10-CM | POA: Diagnosis present

## 2019-08-10 DIAGNOSIS — N390 Urinary tract infection, site not specified: Secondary | ICD-10-CM

## 2019-08-10 DIAGNOSIS — R609 Edema, unspecified: Secondary | ICD-10-CM | POA: Diagnosis not present

## 2019-08-10 DIAGNOSIS — F1990 Other psychoactive substance use, unspecified, uncomplicated: Secondary | ICD-10-CM | POA: Diagnosis not present

## 2019-08-10 DIAGNOSIS — F17210 Nicotine dependence, cigarettes, uncomplicated: Secondary | ICD-10-CM | POA: Diagnosis present

## 2019-08-10 DIAGNOSIS — F1413 Cocaine abuse, unspecified with withdrawal: Secondary | ICD-10-CM | POA: Diagnosis present

## 2019-08-10 DIAGNOSIS — M419 Scoliosis, unspecified: Secondary | ICD-10-CM | POA: Diagnosis present

## 2019-08-10 DIAGNOSIS — I340 Nonrheumatic mitral (valve) insufficiency: Secondary | ICD-10-CM | POA: Diagnosis not present

## 2019-08-10 DIAGNOSIS — R7881 Bacteremia: Secondary | ICD-10-CM | POA: Diagnosis not present

## 2019-08-10 DIAGNOSIS — B1920 Unspecified viral hepatitis C without hepatic coma: Secondary | ICD-10-CM | POA: Diagnosis present

## 2019-08-10 DIAGNOSIS — L02415 Cutaneous abscess of right lower limb: Secondary | ICD-10-CM | POA: Diagnosis present

## 2019-08-10 DIAGNOSIS — L03119 Cellulitis of unspecified part of limb: Secondary | ICD-10-CM

## 2019-08-10 DIAGNOSIS — L03115 Cellulitis of right lower limb: Secondary | ICD-10-CM | POA: Diagnosis present

## 2019-08-10 DIAGNOSIS — B9689 Other specified bacterial agents as the cause of diseases classified elsewhere: Secondary | ICD-10-CM | POA: Diagnosis not present

## 2019-08-10 DIAGNOSIS — I058 Other rheumatic mitral valve diseases: Secondary | ICD-10-CM | POA: Diagnosis present

## 2019-08-10 DIAGNOSIS — B9562 Methicillin resistant Staphylococcus aureus infection as the cause of diseases classified elsewhere: Secondary | ICD-10-CM | POA: Diagnosis not present

## 2019-08-10 DIAGNOSIS — R319 Hematuria, unspecified: Secondary | ICD-10-CM

## 2019-08-10 DIAGNOSIS — F319 Bipolar disorder, unspecified: Secondary | ICD-10-CM | POA: Diagnosis present

## 2019-08-10 DIAGNOSIS — B182 Chronic viral hepatitis C: Secondary | ICD-10-CM | POA: Diagnosis not present

## 2019-08-10 DIAGNOSIS — Z20822 Contact with and (suspected) exposure to covid-19: Secondary | ICD-10-CM | POA: Diagnosis present

## 2019-08-10 DIAGNOSIS — F1910 Other psychoactive substance abuse, uncomplicated: Secondary | ICD-10-CM | POA: Diagnosis not present

## 2019-08-10 DIAGNOSIS — F1210 Cannabis abuse, uncomplicated: Secondary | ICD-10-CM | POA: Diagnosis present

## 2019-08-10 DIAGNOSIS — B965 Pseudomonas (aeruginosa) (mallei) (pseudomallei) as the cause of diseases classified elsewhere: Secondary | ICD-10-CM | POA: Diagnosis not present

## 2019-08-10 LAB — BASIC METABOLIC PANEL
Anion gap: 9 (ref 5–15)
BUN: 10 mg/dL (ref 6–20)
CO2: 27 mmol/L (ref 22–32)
Calcium: 9 mg/dL (ref 8.9–10.3)
Chloride: 102 mmol/L (ref 98–111)
Creatinine, Ser: 0.7 mg/dL (ref 0.44–1.00)
GFR calc Af Amer: >60 mL/min (ref >60)
GFR calc non Af Amer: >60 mL/min (ref >60)
Glucose, Bld: 97 mg/dL (ref 70–99)
Potassium: 4.2 mmol/L (ref 3.5–5.1)
Sodium: 138 mmol/L (ref 135–145)

## 2019-08-10 LAB — URINALYSIS, ROUTINE W REFLEX MICROSCOPIC
Bilirubin Urine: NEGATIVE
Glucose, UA: NEGATIVE mg/dL
Hgb urine dipstick: NEGATIVE
Ketones, ur: NEGATIVE mg/dL
Nitrite: NEGATIVE
Protein, ur: 30 mg/dL — AB
Specific Gravity, Urine: 1.023 (ref 1.005–1.030)
pH: 5 (ref 5.0–8.0)

## 2019-08-10 LAB — CBC
HCT: 40.9 % (ref 36.0–46.0)
Hemoglobin: 12.8 g/dL (ref 12.0–15.0)
MCH: 27.4 pg (ref 26.0–34.0)
MCHC: 31.3 g/dL (ref 30.0–36.0)
MCV: 87.4 fL (ref 80.0–100.0)
Platelets: 279 10*3/uL (ref 150–400)
RBC: 4.68 MIL/uL (ref 3.87–5.11)
RDW: 13.8 % (ref 11.5–15.5)
WBC: 8.9 10*3/uL (ref 4.0–10.5)
nRBC: 0 % (ref 0.0–0.2)

## 2019-08-10 LAB — I-STAT BETA HCG BLOOD, ED (MC, WL, AP ONLY): I-stat hCG, quantitative: <5 m[IU]/mL (ref <5)

## 2019-08-10 LAB — LACTIC ACID, PLASMA: Lactic Acid, Venous: 1.1 mmol/L (ref 0.5–1.9)

## 2019-08-10 MED ORDER — LORAZEPAM 2 MG/ML IJ SOLN
1.0000 mg | Freq: Once | INTRAMUSCULAR | Status: AC
  Administered 2019-08-10: 1 mg via INTRAVENOUS
  Filled 2019-08-10: qty 1

## 2019-08-10 MED ORDER — KETOROLAC TROMETHAMINE 15 MG/ML IJ SOLN
15.0000 mg | Freq: Once | INTRAMUSCULAR | Status: AC
  Administered 2019-08-10: 15 mg via INTRAVENOUS
  Filled 2019-08-10: qty 1

## 2019-08-10 MED ORDER — VANCOMYCIN HCL IN DEXTROSE 1-5 GM/200ML-% IV SOLN
1000.0000 mg | Freq: Once | INTRAVENOUS | Status: AC
  Administered 2019-08-10: 1000 mg via INTRAVENOUS
  Filled 2019-08-10: qty 200

## 2019-08-10 MED ORDER — SODIUM CHLORIDE 0.9 % IV SOLN
1.0000 g | Freq: Once | INTRAVENOUS | Status: AC
  Administered 2019-08-11: 1 g via INTRAVENOUS
  Filled 2019-08-10: qty 10

## 2019-08-10 NOTE — ED Notes (Signed)
Family at bedside. 

## 2019-08-10 NOTE — ED Notes (Signed)
Attempt to draw 2nd set of blood cultures unsuccessful

## 2019-08-10 NOTE — ED Provider Notes (Signed)
[facility] Note   CSN: [id number] Arrival date & time: 08/10/19  1508     History Chief Complaint  Patient presents with  . Abscess  . Hematuria    Summer Cain is a 34 y.o. female.  HPI   34yF with numerous complaints. 3w history of fever, chills and night sweats. In past few days she has noticed multiple painful raised red areas, primarily to her legs. She has a hx of IVDU but says she does not inject in LE. Her boyfriend's mother tried draining one area above her R foot today and she says all that came out was a small amount of blood and then then area "bubbled up." No cough. No v/d. Urine has been very dark/cloudy and smells "foul" but denies dysuria.   Past Medical History:  Diagnosis Date  . Pregnant   . Scoliosis     Patient Active Problem List   Diagnosis Date Noted  . Non-Purulent Cellulitis of left hand 04/01/2019  . Hepatitis 04/01/2019  . Substance use disorder 04/01/2019  . Bacteriuria with pyuria 04/01/2019  . Hepatitis C 04/01/2019  . Alcohol use disorder, severe, dependence (HCC) 06/06/2018  . Bipolar affective disorder, currently depressed, moderate (HCC) 06/06/2018  . 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 Moderate cannabis use disorder (HCC) 06/06/2018  . Moderate cocaine use disorder (HCC) 06/06/2018    Past Surgical History:  Procedure Laterality Date  . KIDNEY STONE SURGERY       OB History    Gravida  7   Para  4   Term  1   Preterm  1   AB  3   Living  4     SAB  2   TAB  1   Ectopic  0   Multiple  0   Live Births  4           Family History  Problem Relation Age of Onset  . Hypertension Mother   . Depression Mother   . Hyperlipidemia Father   . Diabetes Paternal Grandmother     Social History   Tobacco Use  . Smoking status: Light Tobacco Smoker    Types: Cigarettes  . Smokeless tobacco: Never Used  . Tobacco comment: uses vape pen  primarily now  Substance Use Topics  . Alcohol use: Yes    Comment: not since found out was pregnant at 2 mths.  . Drug use: Yes    Types: IV    Home Medications Prior to Admission medications   Not on File    Allergies    Latex  Review of Systems   Review of Systems All systems reviewed and negative, other than as noted in HPI.  Physical Exam Updated Vital Signs BP 119/65   Pulse (!) 113   Temp 98.4 F (36.9 C) (Oral)   Resp 18   SpO2 (!) 18%   Physical Exam Vitals and nursing note reviewed.  Constitutional:      General: She is not in acute distress.    Appearance: She is well-developed.  HENT:     Head: Normocephalic and atraumatic.  Eyes:     General:        Right eye: No discharge.        Left eye: No discharge.     Conjunctiva/sclera: Conjunctivae normal.  Cardiovascular:     Rate and Rhythm: Normal rate and regular rhythm.     Heart sounds: Normal heart sounds. No  murmur heard.  No friction rub. No gallop.   Pulmonary:     Effort: Pulmonary effort is normal. No respiratory distress.     Breath sounds: Normal breath sounds.  Abdominal:     General: There is no distension.     Palpations: Abdomen is soft.     Tenderness: There is no abdominal tenderness.  Musculoskeletal:        General: No tenderness.     Cervical back: Neck supple.  Skin:    General: Skin is warm.     Findings: Rash present.     Comments: Track marks b/l UE. Erythematous/indurated area R posterior thigh. What appears to be a small hematoma distal anterior shin. Streaking erythema.   Neurological:     Mental Status: She is alert.  Psychiatric:        Behavior: Behavior normal.        Thought Content: Thought content normal.     ED Results / Procedures / Treatments   Labs (all labs ordered are listed, but only abnormal results are displayed) Labs Reviewed  URINALYSIS, ROUTINE W REFLEX MICROSCOPIC - Abnormal; Notable for the following components:      Result Value    APPearance HAZY (*)    Protein, ur 30 (*)    Leukocytes,Ua MODERATE (*)    Bacteria, UA FEW (*)    All other components within normal limits  CULTURE, BLOOD (ROUTINE X 2)  CULTURE, BLOOD (ROUTINE X 2)  SARS CORONAVIRUS 2 BY RT PCR (HOSPITAL ORDER, PERFORMED IN [facility])  URINE CULTURE  BASIC METABOLIC PANEL  CBC  LACTIC ACID, PLASMA  LACTIC ACID, PLASMA  PREGNANCY, URINE  I-STAT BETA HCG BLOOD, ED ([facility], [facility], [facility] ONLY)    EKG None  Radiology DG Chest Portable 1 View  Result Date: 08/10/2019 CLINICAL DATA:  Fever history of IV drug use EXAM: PORTABLE CHEST 1 VIEW COMPARISON:  None. FINDINGS: The heart size and mediastinal contours are within normal limits. Both lungs are clear. Scoliosis of the spine. IMPRESSION: No active disease. Electronically Signed   By: Jasmine Pang M.D.   On: 08/10/2019 23:22    Procedures Procedures (including critical care time)  Medications Ordered in ED Medications  vancomycin (VANCOCIN) IVPB 1000 mg/200 mL premix (has no administration in time range)  ketorolac (TORADOL) 15 MG/ML injection 15 mg (has no administration in time range)    ED Course  I have reviewed the triage vital signs and the nursing notes.  Pertinent labs & imaging results that were available during my care of the patient were reviewed by me and considered in my medical decision making (see chart for details).    MDM Rules/Calculators/A&P                          34yF with multiple area of erythema and induration consistent with cellulitis. Only area that could potentially be drained is above her R foot. This "bubbled up" after it a needle inserted in attempt to drain. It seems more consistent with an hematoma than an abscess. She is afebrile currently but reports ~3w history of fever/chills/sweats. HR also up to 140s at times. She last snorted heroin yesterday. Some of this may be related to withdrawal symptoms but I also have concern for possible endocarditis. I  do not appreciate a murmur. Possible UTI as well although some squamous cells on UA. Blood cultures. Rocephin/vancomycin. Consider non-emergent echocardiogram.    Final Clinical Impression(s) /  ED Diagnoses Final diagnoses:  Cellulitis of lower extremity, unspecified laterality  Urinary tract infection with hematuria, site unspecified  Intravenous drug abuse [facility])    Rx / DC Orders ED Discharge Orders    None       Raeford Razor, MD 08/10/19 2344

## 2019-08-10 NOTE — ED Triage Notes (Signed)
Patient reports to the ER for abscesses on her legs and also reports she has dark urine. Patient reports she shoots up in her arms and has a hx of staph infections.

## 2019-08-10 NOTE — ED Notes (Signed)
Urine culture sent with UA 

## 2019-08-10 NOTE — ED Notes (Signed)
Provided pt w/labeled specimen cup for urine collection. ENMiles 

## 2019-08-11 ENCOUNTER — Other Ambulatory Visit: Payer: Self-pay

## 2019-08-11 DIAGNOSIS — A419 Sepsis, unspecified organism: Secondary | ICD-10-CM

## 2019-08-11 DIAGNOSIS — L03119 Cellulitis of unspecified part of limb: Secondary | ICD-10-CM

## 2019-08-11 LAB — BASIC METABOLIC PANEL
Anion gap: 10 (ref 5–15)
BUN: 11 mg/dL (ref 6–20)
CO2: 24 mmol/L (ref 22–32)
Calcium: 8.5 mg/dL — ABNORMAL LOW (ref 8.9–10.3)
Chloride: 102 mmol/L (ref 98–111)
Creatinine, Ser: 0.6 mg/dL (ref 0.44–1.00)
GFR calc Af Amer: >60 mL/min (ref >60)
GFR calc non Af Amer: >60 mL/min (ref >60)
Glucose, Bld: 87 mg/dL (ref 70–99)
Potassium: 4.5 mmol/L (ref 3.5–5.1)
Sodium: 136 mmol/L (ref 135–145)

## 2019-08-11 LAB — CBC
HCT: 39.7 % (ref 36.0–46.0)
Hemoglobin: 12.3 g/dL (ref 12.0–15.0)
MCH: 27.1 pg (ref 26.0–34.0)
MCHC: 31 g/dL (ref 30.0–36.0)
MCV: 87.4 fL (ref 80.0–100.0)
Platelets: 274 10*3/uL (ref 150–400)
RBC: 4.54 MIL/uL (ref 3.87–5.11)
RDW: 13.9 % (ref 11.5–15.5)
WBC: 10.9 10*3/uL — ABNORMAL HIGH (ref 4.0–10.5)
nRBC: 0 % (ref 0.0–0.2)

## 2019-08-11 LAB — BLOOD CULTURE ID PANEL (REFLEXED)
Acinetobacter baumannii: NOT DETECTED
Candida albicans: NOT DETECTED
Candida glabrata: NOT DETECTED
Candida krusei: NOT DETECTED
Candida parapsilosis: NOT DETECTED
Candida tropicalis: NOT DETECTED
Carbapenem resistance: NOT DETECTED
Enterobacter cloacae complex: NOT DETECTED
Enterobacteriaceae species: DETECTED — AB
Enterococcus species: NOT DETECTED
Escherichia coli: NOT DETECTED
Haemophilus influenzae: NOT DETECTED
Klebsiella oxytoca: NOT DETECTED
Klebsiella pneumoniae: NOT DETECTED
Listeria monocytogenes: NOT DETECTED
Methicillin resistance: DETECTED — AB
Neisseria meningitidis: NOT DETECTED
Proteus species: NOT DETECTED
Pseudomonas aeruginosa: DETECTED — AB
Serratia marcescens: DETECTED — AB
Staphylococcus aureus (BCID): DETECTED — AB
Staphylococcus species: DETECTED — AB
Streptococcus agalactiae: NOT DETECTED
Streptococcus pneumoniae: NOT DETECTED
Streptococcus pyogenes: NOT DETECTED
Streptococcus species: NOT DETECTED

## 2019-08-11 LAB — SARS CORONAVIRUS 2 BY RT PCR (HOSPITAL ORDER, PERFORMED IN ~~LOC~~ HOSPITAL LAB): SARS Coronavirus 2: NEGATIVE

## 2019-08-11 LAB — RAPID URINE DRUG SCREEN, HOSP PERFORMED
Amphetamines: NOT DETECTED
Barbiturates: NOT DETECTED
Benzodiazepines: NOT DETECTED
Cocaine: NOT DETECTED
Opiates: POSITIVE — AB
Tetrahydrocannabinol: NOT DETECTED

## 2019-08-11 LAB — LACTIC ACID, PLASMA
Lactic Acid, Venous: 2.2 mmol/L (ref 0.5–1.9)
Lactic Acid, Venous: 1.4 mmol/L (ref 0.5–1.9)

## 2019-08-11 MED ORDER — SODIUM CHLORIDE 0.9 % IV BOLUS
1000.0000 mL | Freq: Once | INTRAVENOUS | Status: AC
  Administered 2019-08-11: 1000 mL via INTRAVENOUS

## 2019-08-11 MED ORDER — ACETAMINOPHEN 325 MG PO TABS
650.0000 mg | ORAL_TABLET | Freq: Four times a day (QID) | ORAL | Status: DC | PRN
  Administered 2019-08-12 (×2): 650 mg via ORAL
  Filled 2019-08-11 (×2): qty 2

## 2019-08-11 MED ORDER — HYDROMORPHONE HCL 1 MG/ML IJ SOLN
1.0000 mg | INTRAMUSCULAR | Status: DC | PRN
  Administered 2019-08-11 – 2019-08-12 (×5): 1 mg via INTRAVENOUS
  Filled 2019-08-11 (×6): qty 1

## 2019-08-11 MED ORDER — VANCOMYCIN HCL 750 MG/150ML IV SOLN
750.0000 mg | Freq: Two times a day (BID) | INTRAVENOUS | Status: DC
  Administered 2019-08-11 – 2019-08-12 (×3): 750 mg via INTRAVENOUS
  Filled 2019-08-11 (×4): qty 150

## 2019-08-11 MED ORDER — MORPHINE SULFATE (PF) 2 MG/ML IV SOLN
2.0000 mg | INTRAVENOUS | Status: DC | PRN
  Administered 2019-08-11 (×2): 2 mg via INTRAVENOUS
  Filled 2019-08-11 (×2): qty 1

## 2019-08-11 MED ORDER — SODIUM CHLORIDE 0.9 % IV SOLN
2.0000 g | Freq: Three times a day (TID) | INTRAVENOUS | Status: DC
  Administered 2019-08-11 – 2019-08-13 (×6): 2 g via INTRAVENOUS
  Filled 2019-08-11 (×7): qty 2

## 2019-08-11 MED ORDER — ENOXAPARIN SODIUM 40 MG/0.4ML ~~LOC~~ SOLN
40.0000 mg | SUBCUTANEOUS | Status: DC
  Administered 2019-08-11: 40 mg via SUBCUTANEOUS
  Filled 2019-08-11: qty 0.4

## 2019-08-11 MED ORDER — VANCOMYCIN HCL IN DEXTROSE 1-5 GM/200ML-% IV SOLN: 1000.0000 mg | Freq: Once | INTRAVENOUS | Status: DC

## 2019-08-11 MED ORDER — PIPERACILLIN-TAZOBACTAM 3.375 G IVPB
3.3750 g | Freq: Three times a day (TID) | INTRAVENOUS | Status: DC
  Administered 2019-08-11 (×2): 3.375 g via INTRAVENOUS
  Filled 2019-08-11 (×2): qty 50

## 2019-08-11 MED ORDER — MAGNESIUM HYDROXIDE 400 MG/5ML PO SUSP: 30.0000 mL | Freq: Every day | ORAL | Status: DC | PRN

## 2019-08-11 MED ORDER — TRAZODONE HCL 50 MG PO TABS
25.0000 mg | ORAL_TABLET | Freq: Every evening | ORAL | Status: DC | PRN
  Administered 2019-08-11 – 2019-08-12 (×2): 25 mg via ORAL
  Filled 2019-08-11 (×2): qty 1

## 2019-08-11 MED ORDER — VANCOMYCIN HCL IN DEXTROSE 1-5 GM/200ML-% IV SOLN
1000.0000 mg | Freq: Two times a day (BID) | INTRAVENOUS | Status: DC
  Administered 2019-08-11: 1000 mg via INTRAVENOUS
  Filled 2019-08-11: qty 200

## 2019-08-11 MED ORDER — SODIUM CHLORIDE 0.9 % IV SOLN: INTRAVENOUS | Status: DC

## 2019-08-11 MED ORDER — ACETAMINOPHEN 650 MG RE SUPP: 650.0000 mg | Freq: Four times a day (QID) | RECTAL | Status: DC | PRN

## 2019-08-11 MED ORDER — KETOROLAC TROMETHAMINE 15 MG/ML IJ SOLN: 15.0000 mg | Freq: Four times a day (QID) | INTRAMUSCULAR | Status: DC | PRN

## 2019-08-11 NOTE — Progress Notes (Signed)
Pharmacy Antibiotic Note  Summer Cain is a 34 y.o. female admitted on 08/10/2019 with cellulitis.  Pharmacy has been consulted for Vancomycin & Zosyn dosing.  Afebrile (but reports fever/chills PTA), WBC & Scr WNL  Plan: Zosyn 3.375gm IV Q8h to be infused over 4hrs Vancomycin 1gm IV q12h Monitor renal function and cx data      Temp (24hrs), Avg:98.4 F (36.9 C), Min:98.4 F (36.9 C), Max:98.4 F (36.9 C)  Recent Labs  Lab 08/10/19 1629 08/10/19 2232  WBC 8.9  --   CREATININE 0.70  --   LATICACIDVEN  --  1.1    CrCl cannot be calculated (Unknown ideal weight.).    Allergies  Allergen Reactions  . Latex Swelling    Antimicrobials this admission: 7/24 Vancomycin >>  7/24 Zosyn >>  7/24 Rocephin x1 in ED  Dose adjustments this admission:  Microbiology results: 7/23 BCx x1 set:  7/23 COVID negative  Thank you for allowing pharmacy to be a part of this patient's care.  Junita Push PharmD, BCPS 08/11/2019 1:51 AM

## 2019-08-11 NOTE — Progress Notes (Signed)
PHARMACY - PHYSICIAN COMMUNICATION CRITICAL VALUE ALERT - BLOOD CULTURE IDENTIFICATION (BCID)  Summer Cain is an 34 y.o. female with hx IVDU presented to the ED on 08/10/2019 with a chief complaint of redness and swelling of RLE.  She's currently on vancomycin and zosyn.  One set of bcx has GNR and GPC (BCID = MRSA, PsA and Serratia).  Name of physician (or Provider) Contacted: Dr. Irene Limbo  Current antibiotics: vancomycin and zosyn  Changes to prescribed antibiotics recommended:  - cont vancomycin and zosyn  Results for orders placed or performed during the hospital encounter of 08/10/19  Blood Culture ID Panel (Reflexed) (Collected: 08/10/2019 10:32 PM)  Result Value Ref Range   Enterococcus species NOT DETECTED NOT DETECTED   Listeria monocytogenes NOT DETECTED NOT DETECTED   Staphylococcus species DETECTED (A) NOT DETECTED   Staphylococcus aureus (BCID) DETECTED (A) NOT DETECTED   Methicillin resistance DETECTED (A) NOT DETECTED   Streptococcus species NOT DETECTED NOT DETECTED   Streptococcus agalactiae NOT DETECTED NOT DETECTED   Streptococcus pneumoniae NOT DETECTED NOT DETECTED   Streptococcus pyogenes NOT DETECTED NOT DETECTED   Acinetobacter baumannii NOT DETECTED NOT DETECTED   Enterobacteriaceae species DETECTED (A) NOT DETECTED   Enterobacter cloacae complex NOT DETECTED NOT DETECTED   Escherichia coli NOT DETECTED NOT DETECTED   Klebsiella oxytoca NOT DETECTED NOT DETECTED   Klebsiella pneumoniae NOT DETECTED NOT DETECTED   Proteus species NOT DETECTED NOT DETECTED   Serratia marcescens DETECTED (A) NOT DETECTED   Carbapenem resistance NOT DETECTED NOT DETECTED   Haemophilus influenzae NOT DETECTED NOT DETECTED   Neisseria meningitidis NOT DETECTED NOT DETECTED   Pseudomonas aeruginosa DETECTED (A) NOT DETECTED   Candida albicans NOT DETECTED NOT DETECTED   Candida glabrata NOT DETECTED NOT DETECTED   Candida krusei NOT DETECTED NOT DETECTED   Candida  parapsilosis NOT DETECTED NOT DETECTED   Candida tropicalis NOT DETECTED NOT DETECTED    Arlester Marker, Mannie Ohlin P 08/11/2019  3:04 PM

## 2019-08-11 NOTE — ED Notes (Signed)
Patient received breakfast tray 

## 2019-08-11 NOTE — ED Notes (Signed)
Went in patient room to answer phone due to patient sleeping, and upon answering phone, this nurse discovered a large collection of needle syringes at bedside hidden in a plastic bag and wrapped in a bra, also found one in bed with patient that had some white residue present, discovered a cigarette butt and lighter in bed with patient previously, no smell of smoke in room, patient aroused to voice, vitals wnl

## 2019-08-11 NOTE — Progress Notes (Signed)
Patient admitted to 1411 from ED. Patient is alert and oriented. Pt verbalized pain from affected leg and requested for pain medication. Leg area appeared swollen with redness, warm to the touch with serosanguinous drainage from open area. Bed pad is placed under affected leg.  RN informed patient of when PRN pain med is due. Patient refused to let RN take belongings out of room. Patient belongings were stored away while in ED. Patient states  she will leave hospital if her belongings are taken. Charge nurse called AC to come talk to patient. RN informed patient of NPO order at midnight. Patient states understanding. Patient currently sleeping, bed in lowest position, call lights in place. Will continue to monitor

## 2019-08-11 NOTE — Progress Notes (Signed)
PROGRESS NOTE  Summer Cain WUJ:[id number] DOB: [birth year] DOA: 08/10/2019 PCP: Patient, No Pcp Per  Brief History   34yow PMH IVDU presented w/ fever, chills, multiple painful raised red areas, primarily legs. Admitted for RLE cellulitis   A & P  Sepsis secondary to RLE cellulitis secondary most likely to IVDU --right ankle abscess vs. hematoma.  --recheck lactic acid, give fluids, follow clinically; doubt worsening or progression of sepsis --continue empiric abx, f/u culture; analgesics for pain --surgery consult placed for consideration of I&D --TOC consult for substance abuse --check UDS  Hepatitis C w/ quant done 03/2019 --outpt followup needed  Bipolar affective disorder --per chart, not on meds. Monitor  Disposition Plan:  Discussion: Continue antibiotics, follow-up surgical recommendations.  Status is: Inpatient  Remains inpatient appropriate because:IV treatments appropriate due to intensity of illness or inability to take PO   Dispo: The patient is from: Home              Anticipated d/c is to: Home              Anticipated d/c date is: 2 days              Patient currently is not medically stable to d/c.  DVT prophylaxis: Place and maintain sequential compression device Start: 08/11/19 1116 Code Status: Full Family Communication: none present or requested   Brendia Sacks, MD  [facility] Direct contact: see [email or web address] (further directions at bottom of note if needed) 7PM-7AM contact night coverage as at bottom of note 08/11/2019, 11:17 AM  LOS: 1 day   Significant Hospital Events   . Admitted 7/23   Consults:  . General surgery   Procedures:  .   Significant Diagnostic Tests:  Marland Kitchen    Micro Data:  . 7/24 BC >   Antimicrobials:  .   Interval History/Subjective  Feels poorly, severe pain in both legs w/ palpation. No n/v.  Objective   Vitals:  Vitals:   08/11/19 0807 08/11/19 0917  BP: (!) 118/100 (!) 97/58  Pulse: (!) 129 (!) 106    Resp:  15  Temp:  98.4 F (36.9 C)  SpO2: 99% 97%    Exam:  Constitutional:   . Appears calm, uncomfortable, disheveled, ill  ENMT:  . grossly normal hearing  . Lips appear normal Respiratory:  . CTA bilaterally, no w/r/r.  . Respiratory effort normal.  Cardiovascular:  . RRR, no m/r/g . No LE extremity edema   Abdomen:  . Soft ntnd Musculoskeletal:  . RUE, LUE, RLE, LLE   o tone grossly normal, no atrophy, no abnormal movements Skin:  . Induration right outer thigh w/ erythema . Induration right ankle w/ prominent swelling and exquisitve pain w/ palpation . Some erythema left leg and hand, no swelling or induration . Track marks notes Psychiatric:  . Mental status o Mood, affect odd. Follows commands. Speech appropriate.  I have personally reviewed the following:   Today's Data  . BMP unremarkable . WBC up 8.9 > 10.9  Scheduled Meds:  Continuous Infusions: . sodium chloride 100 mL/hr at 08/11/19 0235  . piperacillin-tazobactam 3.375 g (08/11/19 1055)  . sodium chloride    . vancomycin 1,000 mg (08/11/19 1057)    Principal Problem:   Sepsis (HCC) Active Problems:   Hepatitis C   Lower extremity cellulitis   LOS: 1 day   How to contact the [facility] Attending or Consulting provider 7A - 7P or covering provider during after hours 7P -7A, for this patient?  1. Check the care team in [facility] and look for a) attending/consulting [facility] provider listed and b) the [facility] team listed 2. Log into [email or web address] and use [place]'s universal password to access. If you do not have the password, please contact the hospital operator. 3. Locate the [facility] provider you are looking for under [facility] and page to a number that you can be directly reached. 4. If you still have difficulty reaching the provider, please page the [facility] (Director on Call) for the Hospitalists listed on amion for assistance.

## 2019-08-11 NOTE — ED Notes (Signed)
Pt refused to let NT hook her back up to the monitor.

## 2019-08-11 NOTE — Progress Notes (Signed)
Pharmacy Antibiotic Note  Summer Cain is a 34 y.o. female admitted on 08/10/2019 with with hx IVDU presented to the ED on 08/10/2019 with a chief complaint of redness and swelling of RLE.  She's currently on vancomycin and zosyn.  One set of bcx has GNR and GPC (BCID = MRSA, PsA and Serratia).  ID recom. to continue with vancomycin and change zosyn to cefepime.  Plan: - cefepime 2gm IV q8h - adjust vancomycin 750 mg  IV q12h for goal trough 15-20  ___________________________________________  Temp (24hrs), Avg:98.4 F (36.9 C), Min:98.4 F (36.9 C), Max:98.4 F (36.9 C)  Recent Labs  Lab 08/10/19 1629 08/10/19 2232 08/11/19 0500 08/11/19 0530  WBC 8.9  --  10.9*  --   CREATININE 0.70  --  0.60  --   LATICACIDVEN  --  1.1  --  2.2*    CrCl cannot be calculated (Unknown ideal weight.).    Allergies  Allergen Reactions  . Latex Swelling     Thank you for allowing pharmacy to be a part of this patient's care.  Lucia Gaskins 08/11/2019 3:29 PM

## 2019-08-11 NOTE — H&P (Addendum)
[place] at [facility]   PATIENT NAME: Summer Cain    MR#:  [id number]  DATE OF BIRTH:  1985/02/22  DATE OF ADMISSION:  08/10/2019  PRIMARY CARE PHYSICIAN: Patient, No Pcp Per   REQUESTING/REFERRING PHYSICIAN: Raeford Razor, MD  CHIEF COMPLAINT:   Chief Complaint  Patient presents with  . Abscess  . Hematuria    HISTORY OF PRESENT ILLNESS:  Summer Cain  is a 34 y.o. Caucasian female with a known history of hepatitis C and polysubstance abuse including IV heroin abuse as well as cocaine and marijuana, who presented to the emergency room with acute onset of right lower extremity swelling with associated erythema, tenderness and pain with boil/small abscess on her ankle that apparently her boyfriend's mom tried to pop and it extended to her upper leg.  She admitted to intermittent fever and night sweats over the last 3 weeks.  No nausea or vomiting or diarrhea.  She admitted to occasional abdominal pain.  No dysuria, oliguria or hematuria or flank pain.  No chest pain or dyspnea or cough or wheezing.  Her urine has been dark and cloudy with offensive color.  When she came to the ER blood pressure was 94/62 with otherwise normal vital signs.  Labs revealed unremarkable CBC and BMP.  Pregnancy test was negative.  Urinalysis showed 21-50 WBCs and few bacteria.  Portable chest ray showed no acute cardiopulmonary disease.  The patient was given IV vancomycin and Rocephin as well as 1 mg of IV Ativan and 50 mg of IV Toradol.  She will be admitted to a telemetry bed for further evaluation and management. PAST MEDICAL HISTORY:   Past Medical History:  Diagnosis Date  . Pregnant   . Scoliosis     PAST SURGICAL HISTORY:   Past Surgical History:  Procedure Laterality Date  . KIDNEY STONE SURGERY      SOCIAL HISTORY:   Social History   Tobacco Use  . Smoking status: Light Tobacco Smoker    Types: Cigarettes  . Smokeless tobacco: Never Used  . Tobacco  comment: uses vape pen primarily now  Substance Use Topics  . Alcohol use: Yes    Comment: not since found out was pregnant at 2 mths.    FAMILY HISTORY:   Family History  Problem Relation Age of Onset  . Hypertension Mother   . Depression Mother   . Hyperlipidemia Father   . Diabetes Paternal Grandmother     DRUG ALLERGIES:   Allergies  Allergen Reactions  . Latex Swelling    REVIEW OF SYSTEMS:   ROS As per history of present illness. All pertinent systems were reviewed above. Constitutional, HEENT, cardiovascular, respiratory, GI, GU, musculoskeletal, neuro, psychiatric, endocrine, integumentary and hematologic systems were reviewed and are otherwise negative/unremarkable except for positive findings mentioned above in the HPI.   MEDICATIONS AT HOME:   Prior to Admission medications   Not on File      VITAL SIGNS:  Blood pressure 119/65, pulse (!) 113, temperature 98.4 F (36.9 C), temperature source Oral, resp. rate 18, SpO2 (!) 18 %, not currently breastfeeding.  PHYSICAL EXAMINATION:  Physical Exam  GENERAL:  34 y.o.-year-old patient lying in the bed with no acute distress.  EYES: Pupils equal, round, reactive to light and accommodation. No scleral icterus. Extraocular muscles intact.  HEENT: Head atraumatic, normocephalic. Oropharynx and nasopharynx clear.  NECK:  Supple, no jugular venous distention. No thyroid enlargement, no tenderness.  LUNGS: Normal breath sounds bilaterally,  no wheezing, rales,rhonchi or crepitation. No use of accessory muscles of respiration.  CARDIOVASCULAR: Regular rate and rhythm, S1, S2 normal. No murmurs, rubs, or gallops.  ABDOMEN: Soft, nondistended, nontender. Bowel sounds present. No organomegaly or mass.  EXTREMITIES: No pitting edema clubbing or cyanosis.  Skin of the right leg as below NEUROLOGIC: Cranial nerves II through XII are intact. Muscle strength 5/5 in all extremities. Sensation intact. Gait not checked.    PSYCHIATRIC: The patient is alert and oriented x 3.  Normal affect and good eye contact. SKIN: Right leg erythema over the anterior leg as well as right lower lateral thigh with induration on both area as well as small abscess in front of her ankle with associated tenderness and fluctuation as well as surrounding erythema and induration with tenderness and warmth.  She has upper and lower extremities needle tracks.     LABORATORY PANEL:   CBC Recent Labs  Lab 08/10/19 1629  WBC 8.9  HGB 12.8  HCT 40.9  PLT 279   ------------------------------------------------------------------------------------------------------------------  Chemistries  Recent Labs  Lab 08/10/19 1629  NA 138  K 4.2  CL 102  CO2 27  GLUCOSE 97  BUN 10  CREATININE 0.70  CALCIUM 9.0   ------------------------------------------------------------------------------------------------------------------  Cardiac Enzymes No results for input(s): TROPONINI in the last 168 hours. ------------------------------------------------------------------------------------------------------------------  RADIOLOGY:  DG Chest Portable 1 View  Result Date: 08/10/2019 CLINICAL DATA:  Fever history of IV drug use EXAM: PORTABLE CHEST 1 VIEW COMPARISON:  None. FINDINGS: The heart size and mediastinal contours are within normal limits. Both lungs are clear. Scoliosis of the spine. IMPRESSION: No active disease. Electronically Signed   By: Jasmine Pang M.D.   On: 08/10/2019 23:22      IMPRESSION AND PLAN:   1.  Right lower extremity severe nonpurulent cellulitis, likely secondary to IV drug abuse with likely immunosuppression secondarily and associated subsequent sepsis without severe sepsis or septic shock.  This is manifested by tachycardia and mild hypotension as well as tachypnea. -The patient will be admitted to a telemetry bed. -Continue IV antibiotic therapy with vancomycin and Zosyn.  Patient had positive MRSA in the  past. -Pain management will be provided. -Warm compresses will be applied. -General surgery consult will be needed for incision and drainage.  I placed a consult for Dr. Carolynne Edouard and he can be called in a.m. -We will follow lactic acid level. -We will obtain a transthoracic 2D echo given history of recent intermittent fever and night sweats. -We will follow blood cultures.  2.  Polysubstance abuse including, cocaine, marijuana and tobacco abuse. -I counseled her for cessation and she will receive further counseling here. -She will be monitored for withdrawal. -She will benefit from drug rehabilitation program.  3.  Possible UTI that could be contributing to her sepsis. -This should be covered with above-mentioned antibiotics. -Urine culture sensitivity will be obtained.  4.  DVT prophylaxis. -Subcutaneous Lovenox.   All the records are reviewed and case discussed with ED provider. The plan of care was discussed in details with the patient (and family). I answered all questions. The patient agreed to proceed with the above mentioned plan. Further management will depend upon hospital course.   CODE STATUS: Full code  Status is: Inpatient  Remains inpatient appropriate because:Ongoing active pain requiring inpatient pain management, Altered mental status, Ongoing diagnostic testing needed not appropriate for outpatient work up, Unsafe d/c plan, IV treatments appropriate due to intensity of illness or inability to take PO and Inpatient level  of care appropriate due to severity of illness and hemodynamically instability.  Dispo: The patient is from: Home              Anticipated d/c is to: Home              Anticipated d/c date is: 2 days              Patient currently is not medically stable to d/c.   TOTAL TIME TAKING CARE OF THIS PATIENT: 55 minutes.    Hannah Beat M.D on 08/11/2019 at 12:00 AM  [facility]   From 7 PM-7 AM, contact night-coverage [email or web address]  CC:  Primary care physician; Patient, No Pcp Per   Note: This dictation was prepared with Dragon dictation along with smaller phrase technology. Any transcriptional typo errors that result from this process are unintentional.

## 2019-08-12 ENCOUNTER — Inpatient Hospital Stay (HOSPITAL_COMMUNITY): Payer: Medicaid Other

## 2019-08-12 ENCOUNTER — Encounter (HOSPITAL_COMMUNITY): Payer: Self-pay | Admitting: Family Medicine

## 2019-08-12 DIAGNOSIS — B965 Pseudomonas (aeruginosa) (mallei) (pseudomallei) as the cause of diseases classified elsewhere: Secondary | ICD-10-CM

## 2019-08-12 DIAGNOSIS — I340 Nonrheumatic mitral (valve) insufficiency: Secondary | ICD-10-CM

## 2019-08-12 DIAGNOSIS — L03115 Cellulitis of right lower limb: Secondary | ICD-10-CM

## 2019-08-12 DIAGNOSIS — B182 Chronic viral hepatitis C: Secondary | ICD-10-CM

## 2019-08-12 DIAGNOSIS — R7881 Bacteremia: Secondary | ICD-10-CM

## 2019-08-12 DIAGNOSIS — B9562 Methicillin resistant Staphylococcus aureus infection as the cause of diseases classified elsewhere: Secondary | ICD-10-CM

## 2019-08-12 DIAGNOSIS — F1990 Other psychoactive substance use, unspecified, uncomplicated: Secondary | ICD-10-CM

## 2019-08-12 DIAGNOSIS — F1910 Other psychoactive substance abuse, uncomplicated: Secondary | ICD-10-CM

## 2019-08-12 DIAGNOSIS — B9689 Other specified bacterial agents as the cause of diseases classified elsewhere: Secondary | ICD-10-CM

## 2019-08-12 LAB — ECHOCARDIOGRAM COMPLETE
Area-P 1/2: 3.54 cm2
Calc EF: 67.9 %
Height: 61 in
MV M vel: 4.13 m/s
MV Peak grad: 68.2 mmHg
Radius: 0.3 cm
S' Lateral: 3 cm
Single Plane A2C EF: 65.2 %
Single Plane A4C EF: 71.3 %
Weight: 1760 oz

## 2019-08-12 LAB — CBC
HCT: 32.1 % — ABNORMAL LOW (ref 36.0–46.0)
Hemoglobin: 10.4 g/dL — ABNORMAL LOW (ref 12.0–15.0)
MCH: 26.9 pg (ref 26.0–34.0)
MCHC: 32.4 g/dL (ref 30.0–36.0)
MCV: 82.9 fL (ref 80.0–100.0)
Platelets: 298 10*3/uL (ref 150–400)
RBC: 3.87 MIL/uL (ref 3.87–5.11)
RDW: 13.9 % (ref 11.5–15.5)
WBC: 8.2 10*3/uL (ref 4.0–10.5)
nRBC: 0 % (ref 0.0–0.2)

## 2019-08-12 LAB — BASIC METABOLIC PANEL
Anion gap: 9 (ref 5–15)
BUN: 7 mg/dL (ref 6–20)
CO2: 20 mmol/L — ABNORMAL LOW (ref 22–32)
Calcium: 8.5 mg/dL — ABNORMAL LOW (ref 8.9–10.3)
Chloride: 108 mmol/L (ref 98–111)
Creatinine, Ser: 0.49 mg/dL (ref 0.44–1.00)
GFR calc Af Amer: >60 mL/min (ref >60)
GFR calc non Af Amer: >60 mL/min (ref >60)
Glucose, Bld: 86 mg/dL (ref 70–99)
Potassium: 5 mmol/L (ref 3.5–5.1)
Sodium: 137 mmol/L (ref 135–145)

## 2019-08-12 LAB — GLUCOSE, CAPILLARY: Glucose-Capillary: 99 mg/dL (ref 70–99)

## 2019-08-12 MED ORDER — GADOBUTROL 1 MMOL/ML IV SOLN
5.0000 mL | Freq: Once | INTRAVENOUS | Status: AC | PRN
  Administered 2019-08-12: 5 mL via INTRAVENOUS

## 2019-08-12 MED ORDER — NICOTINE 14 MG/24HR TD PT24
14.0000 mg | MEDICATED_PATCH | TRANSDERMAL | Status: DC
  Administered 2019-08-12: 14 mg via TRANSDERMAL
  Filled 2019-08-12: qty 1

## 2019-08-12 MED ORDER — HYDROMORPHONE HCL 1 MG/ML IJ SOLN
1.0000 mg | INTRAMUSCULAR | Status: DC | PRN
  Administered 2019-08-12 – 2019-08-13 (×9): 1 mg via INTRAVENOUS
  Filled 2019-08-12 (×8): qty 1

## 2019-08-12 NOTE — Progress Notes (Signed)
Contacted to rule out septic right ankle.  Recommend MRI given overlying cellulitis and risk of seating joint with arthrocentesis.  Will follow up on MRI.  Full consult to follow.

## 2019-08-12 NOTE — Progress Notes (Signed)
PT refused MRI wrist. Pt completed MRI ankle wo/w at this time. Informed pt will have to wait 48 hours for wrist MRI if needed per Radiology protocol.

## 2019-08-12 NOTE — Consult Note (Signed)
CC: RLE pain  Requesting provider: Dr Irene Limbo  HPI: Summer Cain is an 34 y.o. female who is here for RLE pain and cellulitis.  We were consulted to evaluate for abscess.  H/o IVDU.    Past Medical History:  Diagnosis Date  . Pregnant   . Scoliosis     Past Surgical History:  Procedure Laterality Date  . KIDNEY STONE SURGERY      Family History  Problem Relation Age of Onset  . Hypertension Mother   . Depression Mother   . Hyperlipidemia Father   . Diabetes Paternal Grandmother     Social:  reports that she has been smoking cigarettes. She has never used smokeless tobacco. She reports current alcohol use. She reports current drug use. Drug: IV.  Allergies:  Allergies  Allergen Reactions  . Latex Swelling    Medications: I have reviewed the patient's current medications.  Results for orders placed or performed during the hospital encounter of 08/10/19 (from the past 48 hour(s))  Basic metabolic panel     Status: None   Collection Time: 08/10/19  4:29 PM  Result Value Ref Range   Sodium 138 135 - 145 mmol/L   Potassium 4.2 3.5 - 5.1 mmol/L   Chloride 102 98 - 111 mmol/L   CO2 27 22 - 32 mmol/L   Glucose, Bld 97 70 - 99 mg/dL    Comment: Glucose reference range applies only to samples taken after fasting for at least 8 hours.   BUN 10 6 - 20 mg/dL   Creatinine, Ser 1.49 0.44 - 1.00 mg/dL   Calcium 9.0 8.9 - 70.2 mg/dL   GFR calc non Af Amer >60 >60 mL/min   GFR calc Af Amer >60 >60 mL/min   Anion gap 9 5 - 15    Comment: Performed at [facility], [address], [place], [place] [address]  CBC     Status: None   Collection Time: 08/10/19  4:29 PM  Result Value Ref Range   WBC 8.9 4.0 - 10.5 K/uL   RBC 4.68 3.87 - 5.11 MIL/uL   Hemoglobin 12.8 12.0 - 15.0 g/dL   HCT 88.5 36 - 46 %   MCV 87.4 80.0 - 100.0 fL   MCH 27.4 26.0 - 34.0 pg   MCHC 31.3 30.0 - 36.0 g/dL   RDW 02.7 74.1 - 28.7 %   Platelets 279 150 - 400 K/uL   nRBC 0.0 0.0  - 0.2 %    Comment: Performed at [facility], [address], [place], [place] [address]  I-Stat beta hCG blood, ED     Status: None   Collection Time: 08/10/19  4:34 PM  Result Value Ref Range   I-stat hCG, quantitative <5.0 <5 mIU/mL   Comment 3            Comment:   GEST. AGE      CONC.  (mIU/mL)   <=1 WEEK        5 - 50     2 WEEKS       50 - 500     3 WEEKS       100 - 10,000     4 WEEKS     1,000 - 30,000        FEMALE AND NON-PREGNANT FEMALE:     LESS THAN 5 mIU/mL   Urinalysis, Routine w reflex microscopic- may I&O cath if menses     Status: Abnormal  Collection Time: 08/10/19  7:27 PM  Result Value Ref Range   Color, Urine YELLOW YELLOW   APPearance HAZY (A) CLEAR   Specific Gravity, Urine 1.023 1.005 - 1.030   pH 5.0 5.0 - 8.0   Glucose, UA NEGATIVE NEGATIVE mg/dL   Hgb urine dipstick NEGATIVE NEGATIVE   Bilirubin Urine NEGATIVE NEGATIVE   Ketones, ur NEGATIVE NEGATIVE mg/dL   Protein, ur 30 (A) NEGATIVE mg/dL   Nitrite NEGATIVE NEGATIVE   Leukocytes,Ua MODERATE (A) NEGATIVE   RBC / HPF 0-5 0 - 5 RBC/hpf   WBC, UA 21-50 0 - 5 WBC/hpf   Bacteria, UA FEW (A) NONE SEEN   Squamous Epithelial / LPF 11-20 0 - 5   Mucus PRESENT     Comment: Performed at [facility], [address], [place], [place] [address]  SARS Coronavirus 2 by RT PCR (hospital order, performed in [facility] hospital lab) Nasopharyngeal Nasopharyngeal Swab     Status: None   Collection Time: 08/10/19 10:06 PM   Specimen: Nasopharyngeal Swab  Result Value Ref Range   SARS Coronavirus 2 NEGATIVE NEGATIVE    Comment: (NOTE) SARS-CoV-2 target nucleic acids are NOT DETECTED.  The SARS-CoV-2 RNA is generally detectable in upper and lower respiratory specimens during the acute phase of infection. The lowest concentration of SARS-CoV-2 viral copies this assay can detect is 250 copies / mL. A negative result does not preclude SARS-CoV-2 infection and should not be  used as the sole basis for treatment or other patient management decisions.  A negative result may occur with improper specimen collection / handling, submission of specimen other than nasopharyngeal swab, presence of viral mutation(s) within the areas targeted by this assay, and inadequate number of viral copies (<250 copies / mL). A negative result must be combined with clinical observations, patient history, and epidemiological information.  Fact Sheet for Patients:   BoilerBrush.com.cy  Fact Sheet for Healthcare Providers: https://pope.com/  This test is not yet approved or  cleared by the [place] FDA and has been authorized for detection and/or diagnosis of SARS-CoV-2 by FDA under an Emergency Use Authorization (EUA).  This EUA will remain in effect (meaning this test can be used) for the duration of the COVID-19 declaration under Section 564(b)(1) of the Act, 21 U.S.C. section 360bbb-3(b)(1), unless the authorization is terminated or revoked sooner.  Performed at [facility], [address] [place], [place] [address]   Lactic acid, plasma     Status: None   Collection Time: 08/10/19 10:32 PM  Result Value Ref Range   Lactic Acid, Venous 1.1 0.5 - 1.9 mmol/L    Comment: Performed at [facility], [address], [place], [place] [address]  Blood culture (routine x 2)     Status: None (Preliminary result)   Collection Time: 08/10/19 10:32 PM   Specimen: BLOOD  Result Value Ref Range   Specimen Description      BLOOD LEFT HAND Performed at [facility], [address], [place], [place] [address]    Special Requests      BOTTLES DRAWN AEROBIC AND ANAEROBIC Blood Culture adequate volume Performed at [facility], [address], [place], [place] [address]    Culture  Setup Time      GRAM NEGATIVE RODS GRAM POSITIVE COCCI IN BOTH AEROBIC AND  ANAEROBIC BOTTLES CRITICAL RESULT CALLED TO, READ BACK BY AND VERIFIED WITH: PHARMD A PHAN [id number] AT 1455 BY CM Performed at  [facility], [address] [place], [place], [place] [address]    Culture GRAM NEGATIVE RODS GRAM POSITIVE COCCI     Report Status PENDING   Blood Culture ID Panel (Reflexed)     Status: Abnormal   Collection Time: 08/10/19 10:32 PM  Result Value Ref Range   Enterococcus species NOT DETECTED NOT DETECTED   Listeria monocytogenes NOT DETECTED NOT DETECTED   Staphylococcus species DETECTED (A) NOT DETECTED    Comment: CRITICAL RESULT CALLED TO, READ BACK BY AND VERIFIED WITH: PHARMD A PHAN [id number] AT 1455 BY CM    Staphylococcus aureus (BCID) DETECTED (A) NOT DETECTED    Comment: Methicillin (oxacillin)-resistant Staphylococcus aureus (MRSA). MRSA is predictably resistant to beta-lactam antibiotics (except ceftaroline). Preferred therapy is vancomycin unless clinically contraindicated. Patient requires contact precautions if  hospitalized. CRITICAL RESULT CALLED TO, READ BACK BY AND VERIFIED WITH: PHARMD A PHAN [id number] AT 1455 BY CM    Methicillin resistance DETECTED (A) NOT DETECTED    Comment: CRITICAL RESULT CALLED TO, READ BACK BY AND VERIFIED WITH: PHARMD A PHAN [id number] AT 1455 BY CM    Streptococcus species NOT DETECTED NOT DETECTED   Streptococcus agalactiae NOT DETECTED NOT DETECTED   Streptococcus pneumoniae NOT DETECTED NOT DETECTED   Streptococcus pyogenes NOT DETECTED NOT DETECTED   Acinetobacter baumannii NOT DETECTED NOT DETECTED   Enterobacteriaceae species DETECTED (A) NOT DETECTED    Comment: Enterobacteriaceae represent a large family of gram-negative bacteria, not a single organism. CRITICAL RESULT CALLED TO, READ BACK BY AND VERIFIED WITH: PHARMD A PHAN [id number] AT 1455 BY CM    Enterobacter cloacae complex NOT DETECTED NOT DETECTED   Escherichia coli NOT DETECTED NOT DETECTED   Klebsiella oxytoca NOT DETECTED NOT DETECTED   Klebsiella  pneumoniae NOT DETECTED NOT DETECTED   Proteus species NOT DETECTED NOT DETECTED   Serratia marcescens DETECTED (A) NOT DETECTED    Comment: CRITICAL RESULT CALLED TO, READ BACK BY AND VERIFIED WITH: PHARMD A PHAN [id number] AT 1455 BY CM    Carbapenem resistance NOT DETECTED NOT DETECTED   Haemophilus influenzae NOT DETECTED NOT DETECTED   Neisseria meningitidis NOT DETECTED NOT DETECTED   Pseudomonas aeruginosa DETECTED (A) NOT DETECTED    Comment: CRITICAL RESULT CALLED TO, READ BACK BY AND VERIFIED WITH: PHARMD A PHAN [id number] AT 1455 BY CM    Candida albicans NOT DETECTED NOT DETECTED   Candida glabrata NOT DETECTED NOT DETECTED   Candida krusei NOT DETECTED NOT DETECTED   Candida parapsilosis NOT DETECTED NOT DETECTED   Candida tropicalis NOT DETECTED NOT DETECTED    Comment: Performed at [facility], [address] [place], [place] [address]  Basic metabolic panel     Status: Abnormal   Collection Time: 08/11/19  5:00 AM  Result Value Ref Range   Sodium 136 135 - 145 mmol/L   Potassium 4.5 3.5 - 5.1 mmol/L   Chloride 102 98 - 111 mmol/L   CO2 24 22 - 32 mmol/L   Glucose, Bld 87 70 - 99 mg/dL    Comment: Glucose reference range applies only to samples taken after fasting for at least 8 hours.   BUN 11 6 - 20 mg/dL   Creatinine, Ser 4.40 0.44 - 1.00 mg/dL   Calcium 8.5 (L) 8.9 - 10.3 mg/dL   GFR calc non Af Amer >60 >60 mL/min   GFR calc Af Amer >60 >60 mL/min   Anion gap 10 5 - 15    Comment: Performed  at [facility], [address] [place], [place] [address]  CBC     Status: Abnormal   Collection Time: 08/11/19  5:00 AM  Result Value Ref Range   WBC 10.9 (H) 4.0 - 10.5 K/uL   RBC 4.54 3.87 - 5.11 MIL/uL   Hemoglobin 12.3 12.0 - 15.0 g/dL   HCT 82.4 36 - 46 %   MCV 87.4 80.0 - 100.0 fL   MCH 27.1 26.0 - 34.0 pg   MCHC 31.0 30.0 - 36.0 g/dL   RDW 23.5 36.1 - 44.3 %   Platelets 274 150 - 400 K/uL   nRBC 0.0 0.0 - 0.2 %    Comment: Performed  at [facility], [address], [place], [place] [address]  Lactic acid, plasma     Status: Abnormal   Collection Time: 08/11/19  5:30 AM  Result Value Ref Range   Lactic Acid, Venous 2.2 (HH) 0.5 - 1.9 mmol/L    Comment: CRITICAL RESULT CALLED TO, READ BACK BY AND VERIFIED WITH: C JACKSON AT 0630 ON 08/11/2019 BY MOSLEY,J  Performed at [facility], [address], [place], [place] [address]   Lactic acid, plasma     Status: None   Collection Time: 08/11/19 11:15 AM  Result Value Ref Range   Lactic Acid, Venous 1.4 0.5 - 1.9 mmol/L    Comment: Performed at [facility], [address] [place], [place] [address]  Urine rapid drug screen (hosp performed)     Status: Abnormal   Collection Time: 08/11/19 10:48 PM  Result Value Ref Range   Opiates POSITIVE (A) NONE DETECTED   Cocaine NONE DETECTED NONE DETECTED   Benzodiazepines NONE DETECTED NONE DETECTED   Amphetamines NONE DETECTED NONE DETECTED   Tetrahydrocannabinol NONE DETECTED NONE DETECTED   Barbiturates NONE DETECTED NONE DETECTED    Comment: (NOTE) DRUG SCREEN FOR MEDICAL PURPOSES ONLY.  IF CONFIRMATION IS NEEDED FOR ANY PURPOSE, NOTIFY LAB WITHIN 5 DAYS.  LOWEST DETECTABLE LIMITS FOR URINE DRUG SCREEN Drug Class                     Cutoff (ng/mL) Amphetamine and metabolites    1000 Barbiturate and metabolites    200 Benzodiazepine                 200 Tricyclics and metabolites     300 Opiates and metabolites        300 Cocaine and metabolites        300 THC                            50 Performed at [facility], [address] [place], [place] [address]   Glucose, capillary     Status: None   Collection Time: 08/12/19  4:24 AM  Result Value Ref Range   Glucose-Capillary 99 70 - 99 mg/dL    Comment: Glucose reference range applies only to samples taken after fasting for at least 8 hours.    DG Chest Portable 1 View  Result Date:  08/10/2019 CLINICAL DATA:  Fever history of IV drug use EXAM: PORTABLE CHEST 1 VIEW COMPARISON:  None. FINDINGS: The heart size and mediastinal contours are within normal limits. Both lungs are clear. Scoliosis of the spine. IMPRESSION: No active disease. Electronically Signed   By: Jasmine Pang M.D.   On: 08/10/2019 23:22    ROS -  all of the below systems have been reviewed with the patient and positives are indicated with bold text General: chills, fever or night sweats Eyes: blurry vision or double vision ENT: epistaxis or sore throat Allergy/Immunology: itchy/watery eyes or nasal congestion Hematologic/Lymphatic: bleeding problems, blood clots or swollen lymph nodes Endocrine: temperature intolerance or unexpected weight changes Breast: new or changing breast lumps or nipple discharge Resp: cough, shortness of breath, or wheezing CV: chest pain or dyspnea on exertion GI: as per HPI GU: dysuria, trouble voiding, or hematuria MSK: joint pain or joint stiffness Neuro: TIA or stroke symptoms Derm: pruritus and skin lesion changes Psych: anxiety and depression  PE Blood pressure (!) 96/61, pulse 73, temperature 98.2 F (36.8 C), temperature source Oral, resp. rate 16, height  (1.549 m), weight 49.9 kg, SpO2 98 %, not currently breastfeeding. Constitutional: NAD; conversant; no deformities Eyes: Moist conjunctiva; no lid lag; anicteric; PERRL Neck: Trachea midline; no thyromegaly Lungs: Normal respiratory effort; no tactile fremitus CV: RRR;  GI: Abd soft MSK: Decreased range of motion in right lower extremity; no clubbing/cyanosis   Results for orders placed or performed during the hospital encounter of 08/10/19 (from the past 48 hour(s))  Basic metabolic panel     Status: None   Collection Time: 08/10/19  4:29 PM  Result Value Ref Range   Sodium 138 135 - 145 mmol/L   Potassium 4.2 3.5 - 5.1 mmol/L   Chloride 102 98 - 111 mmol/L   CO2 27 22 - 32 mmol/L   Glucose, Bld 97  70 - 99 mg/dL    Comment: Glucose reference range applies only to samples taken after fasting for at least 8 hours.   BUN 10 6 - 20 mg/dL   Creatinine, Ser 1.61 0.44 - 1.00 mg/dL   Calcium 9.0 8.9 - 09.6 mg/dL   GFR calc non Af Amer >60 >60 mL/min   GFR calc Af Amer >60 >60 mL/min   Anion gap 9 5 - 15    Comment: Performed at [facility], [address], [place], [place] [address]  CBC     Status: None   Collection Time: 08/10/19  4:29 PM  Result Value Ref Range   WBC 8.9 4.0 - 10.5 K/uL   RBC 4.68 3.87 - 5.11 MIL/uL   Hemoglobin 12.8 12.0 - 15.0 g/dL   HCT 98.1 36 - 46 %   MCV 87.4 80.0 - 100.0 fL   MCH 27.4 26.0 - 34.0 pg   MCHC 31.3 30.0 - 36.0 g/dL   RDW 19.1 47.8 - 29.5 %   Platelets 279 150 - 400 K/uL   nRBC 0.0 0.0 - 0.2 %    Comment: Performed at [facility], [address] [place], [place] [address]  I-Stat beta hCG blood, ED     Status: None   Collection Time: 08/10/19  4:34 PM  Result Value Ref Range   I-stat hCG, quantitative <5.0 <5 mIU/mL   Comment 3            Comment:   GEST. AGE      CONC.  (mIU/mL)   <=1 WEEK        5 - 50     2 WEEKS       50 - 500     3 WEEKS       100 - 10,000     4 WEEKS     1,000 - 30,000  FEMALE AND NON-PREGNANT FEMALE:     LESS THAN 5 mIU/mL   Urinalysis, Routine w reflex microscopic- may I&O cath if menses     Status: Abnormal   Collection Time: 08/10/19  7:27 PM  Result Value Ref Range   Color, Urine YELLOW YELLOW   APPearance HAZY (A) CLEAR   Specific Gravity, Urine 1.023 1.005 - 1.030   pH 5.0 5.0 - 8.0   Glucose, UA NEGATIVE NEGATIVE mg/dL   Hgb urine dipstick NEGATIVE NEGATIVE   Bilirubin Urine NEGATIVE NEGATIVE   Ketones, ur NEGATIVE NEGATIVE mg/dL   Protein, ur 30 (A) NEGATIVE mg/dL   Nitrite NEGATIVE NEGATIVE   Leukocytes,Ua MODERATE (A) NEGATIVE   RBC / HPF 0-5 0 - 5 RBC/hpf   WBC, UA 21-50 0 - 5 WBC/hpf   Bacteria, UA FEW (A) NONE SEEN   Squamous Epithelial / LPF  11-20 0 - 5   Mucus PRESENT     Comment: Performed at [facility], [address] [place], [place] [address]  SARS Coronavirus 2 by RT PCR (hospital order, performed in [facility] hospital lab) Nasopharyngeal Nasopharyngeal Swab     Status: None   Collection Time: 08/10/19 10:06 PM   Specimen: Nasopharyngeal Swab  Result Value Ref Range   SARS Coronavirus 2 NEGATIVE NEGATIVE    Comment: (NOTE) SARS-CoV-2 target nucleic acids are NOT DETECTED.  The SARS-CoV-2 RNA is generally detectable in upper and lower respiratory specimens during the acute phase of infection. The lowest concentration of SARS-CoV-2 viral copies this assay can detect is 250 copies / mL. A negative result does not preclude SARS-CoV-2 infection and should not be used as the sole basis for treatment or other patient management decisions.  A negative result may occur with improper specimen collection / handling, submission of specimen other than nasopharyngeal swab, presence of viral mutation(s) within the areas targeted by this assay, and inadequate number of viral copies (<250 copies / mL). A negative result must be combined with clinical observations, patient history, and epidemiological information.  Fact Sheet for Patients:   BoilerBrush.com.cyhttps://www.fda.gov/media/136312/download  Fact Sheet for Healthcare Providers: https://pope.com/https://www.fda.gov/media/136313/download  This test is not yet approved or  cleared by the [place] FDA and has been authorized for detection and/or diagnosis of SARS-CoV-2 by FDA under an Emergency Use Authorization (EUA).  This EUA will remain in effect (meaning this test can be used) for the duration of the COVID-19 declaration under Section 564(b)(1) of the Act, 21 U.S.C. section 360bbb-3(b)(1), unless the authorization is terminated or revoked sooner.  Performed at [facility], [address] [place], [place] [address]   Lactic acid, plasma     Status:  None   Collection Time: 08/10/19 10:32 PM  Result Value Ref Range   Lactic Acid, Venous 1.1 0.5 - 1.9 mmol/L    Comment: Performed at [facility], [address] [place], [place] [address]  Blood culture (routine x 2)     Status: None (Preliminary result)   Collection Time: 08/10/19 10:32 PM   Specimen: BLOOD  Result Value Ref Range   Specimen Description      BLOOD LEFT HAND Performed at [facility], [address] [place], [place] [address]    Special Requests      BOTTLES DRAWN AEROBIC AND ANAEROBIC Blood Culture adequate volume Performed at [facility], [address] [place], [place] [address]    Culture  Setup Time  GRAM NEGATIVE RODS GRAM POSITIVE COCCI IN BOTH AEROBIC AND ANAEROBIC BOTTLES CRITICAL RESULT CALLED TO, READ BACK BY AND VERIFIED WITH: PHARMD A PHAN [id number] AT 1455 BY CM Performed at [facility], [address], [place], [place] [address]    Culture GRAM NEGATIVE RODS GRAM POSITIVE COCCI     Report Status PENDING   Blood Culture ID Panel (Reflexed)     Status: Abnormal   Collection Time: 08/10/19 10:32 PM  Result Value Ref Range   Enterococcus species NOT DETECTED NOT DETECTED   Listeria monocytogenes NOT DETECTED NOT DETECTED   Staphylococcus species DETECTED (A) NOT DETECTED    Comment: CRITICAL RESULT CALLED TO, READ BACK BY AND VERIFIED WITH: PHARMD A PHAN [id number] AT 1455 BY CM    Staphylococcus aureus (BCID) DETECTED (A) NOT DETECTED    Comment: Methicillin (oxacillin)-resistant Staphylococcus aureus (MRSA). MRSA is predictably resistant to beta-lactam antibiotics (except ceftaroline). Preferred therapy is vancomycin unless clinically contraindicated. Patient requires contact precautions if  hospitalized. CRITICAL RESULT CALLED TO, READ BACK BY AND VERIFIED WITH: PHARMD A PHAN [id number] AT 1455 BY CM    Methicillin resistance DETECTED (A) NOT DETECTED    Comment: CRITICAL RESULT  CALLED TO, READ BACK BY AND VERIFIED WITH: PHARMD A PHAN [id number] AT 1455 BY CM    Streptococcus species NOT DETECTED NOT DETECTED   Streptococcus agalactiae NOT DETECTED NOT DETECTED   Streptococcus pneumoniae NOT DETECTED NOT DETECTED   Streptococcus pyogenes NOT DETECTED NOT DETECTED   Acinetobacter baumannii NOT DETECTED NOT DETECTED   Enterobacteriaceae species DETECTED (A) NOT DETECTED    Comment: Enterobacteriaceae represent a large family of gram-negative bacteria, not a single organism. CRITICAL RESULT CALLED TO, READ BACK BY AND VERIFIED WITH: PHARMD A PHAN [id number] AT 1455 BY CM    Enterobacter cloacae complex NOT DETECTED NOT DETECTED   Escherichia coli NOT DETECTED NOT DETECTED   Klebsiella oxytoca NOT DETECTED NOT DETECTED   Klebsiella pneumoniae NOT DETECTED NOT DETECTED   Proteus species NOT DETECTED NOT DETECTED   Serratia marcescens DETECTED (A) NOT DETECTED    Comment: CRITICAL RESULT CALLED TO, READ BACK BY AND VERIFIED WITH: PHARMD A PHAN [id number] AT 1455 BY CM    Carbapenem resistance NOT DETECTED NOT DETECTED   Haemophilus influenzae NOT DETECTED NOT DETECTED   Neisseria meningitidis NOT DETECTED NOT DETECTED   Pseudomonas aeruginosa DETECTED (A) NOT DETECTED    Comment: CRITICAL RESULT CALLED TO, READ BACK BY AND VERIFIED WITH: PHARMD A PHAN [id number] AT 1455 BY CM    Candida albicans NOT DETECTED NOT DETECTED   Candida glabrata NOT DETECTED NOT DETECTED   Candida krusei NOT DETECTED NOT DETECTED   Candida parapsilosis NOT DETECTED NOT DETECTED   Candida tropicalis NOT DETECTED NOT DETECTED    Comment: Performed at [facility], [address] [place], [place] [address]  Basic metabolic panel     Status: Abnormal   Collection Time: 08/11/19  5:00 AM  Result Value Ref Range   Sodium 136 135 - 145 mmol/L   Potassium 4.5 3.5 - 5.1 mmol/L   Chloride 102 98 - 111 mmol/L   CO2 24 22 - 32 mmol/L   Glucose, Bld 87 70 - 99 mg/dL    Comment: Glucose reference  range applies only to samples taken after fasting for at least 8 hours.   BUN 11 6 - 20 mg/dL   Creatinine, Ser 6.06 0.44 - 1.00 mg/dL   Calcium 8.5 (L) 8.9 - 10.3  mg/dL   GFR calc non Af Amer >60 >60 mL/min   GFR calc Af Amer >60 >60 mL/min   Anion gap 10 5 - 15    Comment: Performed at [facility], [address] [place], [place] [address]  CBC     Status: Abnormal   Collection Time: 08/11/19  5:00 AM  Result Value Ref Range   WBC 10.9 (H) 4.0 - 10.5 K/uL   RBC 4.54 3.87 - 5.11 MIL/uL   Hemoglobin 12.3 12.0 - 15.0 g/dL   HCT 63.0 36 - 46 %   MCV 87.4 80.0 - 100.0 fL   MCH 27.1 26.0 - 34.0 pg   MCHC 31.0 30.0 - 36.0 g/dL   RDW 16.0 10.9 - 32.3 %   Platelets 274 150 - 400 K/uL   nRBC 0.0 0.0 - 0.2 %    Comment: Performed at [facility], [address], [place], [place] [address]  Lactic acid, plasma     Status: Abnormal   Collection Time: 08/11/19  5:30 AM  Result Value Ref Range   Lactic Acid, Venous 2.2 (HH) 0.5 - 1.9 mmol/L    Comment: CRITICAL RESULT CALLED TO, READ BACK BY AND VERIFIED WITH: C JACKSON AT 0630 ON 08/11/2019 BY MOSLEY,J  Performed at [facility], [address], [place], [place] [address]   Lactic acid, plasma     Status: None   Collection Time: 08/11/19 11:15 AM  Result Value Ref Range   Lactic Acid, Venous 1.4 0.5 - 1.9 mmol/L    Comment: Performed at [facility], [address] [place], [place] [address]  Urine rapid drug screen (hosp performed)     Status: Abnormal   Collection Time: 08/11/19 10:48 PM  Result Value Ref Range   Opiates POSITIVE (A) NONE DETECTED   Cocaine NONE DETECTED NONE DETECTED   Benzodiazepines NONE DETECTED NONE DETECTED   Amphetamines NONE DETECTED NONE DETECTED   Tetrahydrocannabinol NONE DETECTED NONE DETECTED   Barbiturates NONE DETECTED NONE DETECTED    Comment: (NOTE) DRUG SCREEN FOR MEDICAL PURPOSES ONLY.  IF CONFIRMATION IS NEEDED FOR  ANY PURPOSE, NOTIFY LAB WITHIN 5 DAYS.  LOWEST DETECTABLE LIMITS FOR URINE DRUG SCREEN Drug Class                     Cutoff (ng/mL) Amphetamine and metabolites    1000 Barbiturate and metabolites    200 Benzodiazepine                 200 Tricyclics and metabolites     300 Opiates and metabolites        300 Cocaine and metabolites        300 THC                            50 Performed at [facility], [address] [place], [place] [address]   Glucose, capillary     Status: None   Collection Time: 08/12/19  4:24 AM  Result Value Ref Range   Glucose-Capillary 99 70 - 99 mg/dL    Comment: Glucose reference range applies only to samples taken after fasting for at least 8 hours.    DG Chest Portable 1 View  Result Date: 08/10/2019 CLINICAL DATA:  Fever history of IV drug use EXAM: PORTABLE CHEST 1 VIEW COMPARISON:  None. FINDINGS: The heart size and mediastinal contours are within  normal limits. Both lungs are clear. Scoliosis of the spine. IMPRESSION: No active disease. Electronically Signed   By: Jasmine Pang M.D.   On: 08/10/2019 23:22     A/P: Summer Cain is an 34 y.o. female with RLE cellulitis.  Appears to be infected hematoma over her right anterior ankle, which is now draining on its own.  Nothing further to do from a general surgery standpoint but would recommend Ortho consult to rule out joint sepsis.  Will sign off.  Please call with any questions or concerns.     Vanita Panda, MD  Colorectal and General Surgery [facility]

## 2019-08-12 NOTE — Progress Notes (Signed)
Assumed care of patient from previous nurse. Agree with previous nurses assessment. Will continue to monitor.  

## 2019-08-12 NOTE — Progress Notes (Signed)
  Echocardiogram 2D Echocardiogram has been performed.  Summer Cain F 08/12/2019, 12:48 PM

## 2019-08-12 NOTE — Progress Notes (Signed)
NT found fresh cigarette butt in room. Room smelled like smoke, patient admitted to smoking a cigarette. Lighter and cigarettes confiscated. MD and [facility] notified. See new orders.

## 2019-08-12 NOTE — Progress Notes (Signed)
Patient refusing blood cultures. Dr. Irene Limbo and Dr. Daiva Eves aware.

## 2019-08-12 NOTE — Consult Note (Signed)
Date of Admission:  08/10/2019          Reason for Consult: Polymicrobial bacteremia and likely septic joint (s) +/ endocarditis in patient who injects IVDrugs    Referring Provider: Dr. Irene Limbo   Assessment:  1. Polymicrobial bacteremia with MRSA, Serratia and Pseudomonas + on BCID 2. Likely septic left ankle 3. R/o septic right wrist, ? Septic left hand 4. IVDU 5. Hepatitis C 6. Possible superficial thrombus left AC  Plan:  1. Continue vancomycin and cefepime  2. MRI of right ankle and MRI right wrist 3. Repeat blood cultures attempted (pt refused) 4. DO NOT PLACE PICC OR CENTRAL LINE 5. TTE and will need TEE 6. HCV, I suspect Chronic will recheck RNA and genotype, check Hep A ab 7. I will order duplex of left AC  Principal Problem:   Sepsis (HCC) Active Problems:   Hepatitis C   Cellulitis of right lower extremity   Scheduled Meds: Continuous Infusions: . sodium chloride 150 mL/hr at 08/12/19 1116  . ceFEPime (MAXIPIME) IV 2 g (08/12/19 1024)  . vancomycin 750 mg (08/12/19 1117)   PRN Meds:.acetaminophen **OR** acetaminophen, HYDROmorphone (DILAUDID) injection, magnesium hydroxide, traZODone  HPI: Summer Cain is a 34 y.o. female with history of injection drug use, hepatitis C presented to emergency room with acute worsening of right lower extremity swelling with erythema and tenderness and an abscess that it drained purulent material.  She been having some fevers and night sweats for the past 3 weeks.  She also has pain in her left AC and as well as pain and tenderness in her right wrist and fingers of her left hand.  She has evidence of lymphangitic spread of her infection her ankle going up her leg.  Her blood cultures on admission are growing MRSA, Serratia and Pseudomonas.  She is currently on vancomycin and cefepime.  Orthopedics of seen the patient I have ordered MRI of her right ankle.  I also ordered an MRI of her right wrist given her  tenderness in that area.  He has pain in the fingers of her left hand as well but to me not clearly an area of infection    Review of Systems: Review of Systems  Constitutional: Positive for fever and malaise/fatigue. Negative for chills, diaphoresis and weight loss.  HENT: Negative for congestion, hearing loss, sore throat and tinnitus.   Eyes: Negative for blurred vision and double vision.  Respiratory: Negative for cough, sputum production, shortness of breath and wheezing.   Cardiovascular: Negative for chest pain, palpitations and leg swelling.  Gastrointestinal: Negative for abdominal pain, blood in stool, constipation, diarrhea, heartburn, melena, nausea and vomiting.  Genitourinary: Negative for dysuria, flank pain and hematuria.  Musculoskeletal: Positive for joint pain and myalgias. Negative for back pain and falls.  Skin: Negative for itching and rash.  Neurological: Negative for dizziness, sensory change, focal weakness, loss of consciousness, weakness and headaches.  Endo/Heme/Allergies: Does not bruise/bleed easily.  Psychiatric/Behavioral: Positive for depression and substance abuse. Negative for memory loss and suicidal ideas. The patient is not nervous/anxious.     Past Medical History:  Diagnosis Date  . Pregnant   . Scoliosis     Social History   Tobacco Use  . Smoking status: Light Tobacco Smoker    Types: Cigarettes  . Smokeless tobacco: Never Used  . Tobacco comment: uses vape pen primarily now  Substance Use Topics  . Alcohol use: Yes    Comment: not since found out was  pregnant at 2 mths.  . Drug use: Yes    Types: IV    Family History  Problem Relation Age of Onset  . Hypertension Mother   . Depression Mother   . Hyperlipidemia Father   . Diabetes Paternal Grandmother    Allergies  Allergen Reactions  . Latex Swelling    OBJECTIVE: Blood pressure 106/80, pulse 70, temperature (!) 97.4 F (36.3 C), temperature source Oral, resp. rate 14,  height 5\' 1"  (1.549 m), weight 49.9 kg, SpO2 97 %, not currently breastfeeding.  Physical Exam Constitutional:      General: She is not in acute distress.    Appearance: She is not diaphoretic.  HENT:     Head: Normocephalic and atraumatic.     Right Ear: External ear normal.     Left Ear: External ear normal.     Nose: Nose normal.     Mouth/Throat:     Pharynx: No oropharyngeal exudate.  Eyes:     General: No scleral icterus.    Extraocular Movements: Extraocular movements intact.     Conjunctiva/sclera: Conjunctivae normal.  Cardiovascular:     Rate and Rhythm: Normal rate and regular rhythm.     Heart sounds: Normal heart sounds. No murmur heard.  No friction rub. No gallop.   Pulmonary:     Effort: Pulmonary effort is normal. No respiratory distress.     Breath sounds: Normal breath sounds. No wheezing or rales.  Abdominal:     General: Bowel sounds are normal. There is no distension.     Palpations: Abdomen is soft.     Tenderness: There is no abdominal tenderness.  Musculoskeletal:        General: No tenderness. Normal range of motion.     Cervical back: Normal range of motion and neck supple.  Lymphadenopathy:     Cervical: No cervical adenopathy.  Skin:    General: Skin is warm and dry.     Coloration: Skin is not pale.     Findings: No erythema or rash.  Neurological:     General: No focal deficit present.     Mental Status: She is alert and oriented to person, place, and time.     Coordination: Coordination normal.  Psychiatric:        Attention and Perception: Attention and perception normal.        Mood and Affect: Affect is flat.        Speech: Speech normal.        Behavior: Behavior is cooperative.        Thought Content: Thought content normal.        Cognition and Memory: Cognition and memory normal.        Judgment: Judgment normal.    Right ankle with purulent area and inability to dorsiflex her foot see picture August 12, 2019:    What appears  to be lymphangitic spread of infection with erythema going up leg see below August 12, 2019:      Left antecubital fossa that is not erythematous but with potential superficial thrombus  Hands right wrist is quite tender she also has some tenderness over her fingers in the left side August 12, 2019:      Lab Results Lab Results  Component Value Date   WBC 8.2 08/12/2019   HGB 10.4 (L) 08/12/2019   HCT 32.1 (L) 08/12/2019   MCV 82.9 08/12/2019   PLT 298 08/12/2019    Lab Results  Component Value Date  CREATININE 0.49 08/12/2019   BUN 7 08/12/2019   NA 137 08/12/2019   K 5.0 08/12/2019   CL 108 08/12/2019   CO2 20 (L) 08/12/2019    Lab Results  Component Value Date   ALT 238 (H) 04/03/2019   AST 138 (H) 04/03/2019   ALKPHOS 119 04/03/2019   BILITOT 1.2 04/03/2019     Microbiology: Recent Results (from the past 240 hour(s))  SARS Coronavirus 2 by RT PCR (hospital order, performed in [facility] hospital lab) Nasopharyngeal Nasopharyngeal Swab     Status: None   Collection Time: 08/10/19 10:06 PM   Specimen: Nasopharyngeal Swab  Result Value Ref Range Status   SARS Coronavirus 2 NEGATIVE NEGATIVE Final    Comment: (NOTE) SARS-CoV-2 target nucleic acids are NOT DETECTED.  The SARS-CoV-2 RNA is generally detectable in upper and lower respiratory specimens during the acute phase of infection. The lowest concentration of SARS-CoV-2 viral copies this assay can detect is 250 copies / mL. A negative result does not preclude SARS-CoV-2 infection and should not be used as the sole basis for treatment or other patient management decisions.  A negative result may occur with improper specimen collection / handling, submission of specimen other than nasopharyngeal swab, presence of viral mutation(s) within the areas targeted by this assay, and inadequate number of viral copies (<250 copies / mL). A negative result must be combined with clinical observations, patient history,  and epidemiological information.  Fact Sheet for Patients:   BoilerBrush.com.cy  Fact Sheet for Healthcare Providers: https://pope.com/  This test is not yet approved or  cleared by the [place] FDA and has been authorized for detection and/or diagnosis of SARS-CoV-2 by FDA under an Emergency Use Authorization (EUA).  This EUA will remain in effect (meaning this test can be used) for the duration of the COVID-19 declaration under Section 564(b)(1) of the Act, 21 U.S.C. section 360bbb-3(b)(1), unless the authorization is terminated or revoked sooner.  Performed at [facility], [address] [place], [place] [address]   Blood culture (routine x 2)     Status: None (Preliminary result)   Collection Time: 08/10/19 10:32 PM   Specimen: BLOOD  Result Value Ref Range Status   Specimen Description   Final    BLOOD LEFT HAND Performed at [facility], [address] [place], [place] [address]    Special Requests   Final    BOTTLES DRAWN AEROBIC AND ANAEROBIC Blood Culture adequate volume Performed at [facility], [address] [place], [place] [address]    Culture  Setup Time   Final    GRAM NEGATIVE RODS GRAM POSITIVE COCCI IN BOTH AEROBIC AND ANAEROBIC BOTTLES CRITICAL RESULT CALLED TO, READ BACK BY AND VERIFIED WITH: PHARMD A PHAN [id number] AT 1455 BY CM    Culture   Final    GRAM NEGATIVE RODS GRAM POSITIVE COCCI CULTURE REINCUBATED FOR BETTER GROWTH Performed at [facility], [address], [place], [place] [address]    Report Status PENDING  Incomplete  Blood Culture ID Panel (Reflexed)     Status: Abnormal   Collection Time: 08/10/19 10:32 PM  Result Value Ref Range Status   Enterococcus species NOT DETECTED NOT DETECTED Final   Listeria monocytogenes NOT DETECTED NOT DETECTED Final   Staphylococcus species DETECTED (A) NOT DETECTED Final    Comment:  CRITICAL RESULT CALLED TO, READ BACK BY AND VERIFIED WITH: PHARMD A PHAN [id number] AT 1455 BY CM  Staphylococcus aureus (BCID) DETECTED (A) NOT DETECTED Final    Comment: Methicillin (oxacillin)-resistant Staphylococcus aureus (MRSA). MRSA is predictably resistant to beta-lactam antibiotics (except ceftaroline). Preferred therapy is vancomycin unless clinically contraindicated. Patient requires contact precautions if  hospitalized. CRITICAL RESULT CALLED TO, READ BACK BY AND VERIFIED WITH: PHARMD A PHAN [phone] AT 1455 BY CM    Methicillin resistance DETECTED (A) NOT DETECTED Final    Comment: CRITICAL RESULT CALLED TO, READ BACK BY AND VERIFIED WITH: PHARMD A PHAN [phone] AT 1455 BY CM    Streptococcus species NOT DETECTED NOT DETECTED Final   Streptococcus agalactiae NOT DETECTED NOT DETECTED Final   Streptococcus pneumoniae NOT DETECTED NOT DETECTED Final   Streptococcus pyogenes NOT DETECTED NOT DETECTED Final   Acinetobacter baumannii NOT DETECTED NOT DETECTED Final   Enterobacteriaceae species DETECTED (A) NOT DETECTED Final    Comment: Enterobacteriaceae represent a large family of gram-negative bacteria, not a single organism. CRITICAL RESULT CALLED TO, READ BACK BY AND VERIFIED WITH: PHARMD A PHAN [phone] AT 1455 BY CM    Enterobacter cloacae complex NOT DETECTED NOT DETECTED Final   Escherichia coli NOT DETECTED NOT DETECTED Final   Klebsiella oxytoca NOT DETECTED NOT DETECTED Final   Klebsiella pneumoniae NOT DETECTED NOT DETECTED Final   Proteus species NOT DETECTED NOT DETECTED Final   Serratia marcescens DETECTED (A) NOT DETECTED Final    Comment: CRITICAL RESULT CALLED TO, READ BACK BY AND VERIFIED WITH: PHARMD A PHAN [phone] AT 1455 BY CM    Carbapenem resistance NOT DETECTED NOT DETECTED Final   Haemophilus influenzae NOT DETECTED NOT DETECTED Final   Neisseria meningitidis NOT DETECTED NOT DETECTED Final   Pseudomonas aeruginosa DETECTED (A) NOT DETECTED Final     Comment: CRITICAL RESULT CALLED TO, READ BACK BY AND VERIFIED WITH: PHARMD A PHAN [phone] AT 1455 BY CM    Candida albicans NOT DETECTED NOT DETECTED Final   Candida glabrata NOT DETECTED NOT DETECTED Final   Candida krusei NOT DETECTED NOT DETECTED Final   Candida parapsilosis NOT DETECTED NOT DETECTED Final   Candida tropicalis NOT DETECTED NOT DETECTED Final    Comment: Performed at [facility], [address] [place], [place] [address]    Acey Lavornelius Van Dam, MD [facility] [phone] pager  08/12/2019, 2:23 PM

## 2019-08-12 NOTE — Progress Notes (Signed)
PROGRESS NOTE  Summer Cain GYB:[id number] DOB: 02-14-1985 DOA: 08/10/2019 PCP: Patient, No Pcp Per  Brief History   34yow PMH IVDU presented w/ fever, chills, multiple painful raised red areas, primarily legs. Admitted for RLE cellulitis   A & P  Sepsis secondary polymicrobial bacteremia MRSA, Pseudomonas, Serratia, w/ RLE cellulitis, secondary most likely to IVDU --hemodynamics stable, WBC now WNL, less ill appearing --surgery consulted, recommend ortho --right ankle appears worse > MRI > orthopedics consult --continue empiric abx and f/u culture; continue analgesics for pain --TOC consult for substance abuse  Hepatitis C w/ quant done 03/2019 --outpt followup needed  Bipolar affective disorder --per chart, not on meds. Monitor  Disposition Plan:  Discussion: less ill appearing today but will need ortho eval and imaging of right ankle. ID consult for assistance w/ eval and treatment of polymicrobial bacteremia  Status is: Inpatient  Remains inpatient appropriate because:IV treatments appropriate due to intensity of illness or inability to take PO   Dispo: The patient is from: Home              Anticipated d/c is to: Home              Anticipated d/c date is: > 3 days              Patient currently is not medically stable to d/c.  DVT prophylaxis: Place and maintain sequential compression device Start: 08/11/19 1116 Code Status: Full Family Communication: none present or requested   Brendia Sacks, MD  [facility] Direct contact: see [email or web address] (further directions at bottom of note if needed) 7PM-7AM contact night coverage as at bottom of note 08/12/2019, 1:18 PM  LOS: 2 days   Significant Hospital Events    Admitted 7/23   Consults:   General surgery  Orthopedics  ID   Procedures:     Significant Diagnostic Tests:      Micro Data:   7/24 BC > BCID MRSA, Serratia, Pseudomonas   Antimicrobials:   Vancomycin 7/24 >  Cefepime 7/24  >  Interval History/Subjective  More awake today but still w/ severe right ankle pain, and bilateral hand pain  Objective   Vitals:  Vitals:   08/11/19 2343 08/12/19 0418  BP: 102/80 (!) 96/61  Pulse: 102 73  Resp: 18 16  Temp: 99.5 F (37.5 C) 98.2 F (36.8 C)  SpO2: 100% 98%    Exam:  Constitutional:    Appears calm, uncomfortable, but less ill today Respiratory:   CTA bilaterally, no w/r/r.   Respiratory effort normal.  Cardiovascular:   RRR, no m/r/g  No LE extremity edema   Musculoskeletal:   RUE wrist and hand appear unremarkable, can make fist  LUE wrist a bit tender, cannot quite make full fist; erythema over lateral wrist  RLE induration lateral right thigh w/ erythema, unchanged. Edema R ankle worse, hematoma appears worse; ankle erythematous   LLE appears uninvolved Skin:   As above Psychiatric:   Mental status o Mood, affect appropriate  I have personally reviewed the following:   Today's Data   BMP unremarkable  WBC now WNL  Hgb 10.4  Scheduled Meds:  Continuous Infusions:  sodium chloride 150 mL/hr at 08/12/19 1116   ceFEPime (MAXIPIME) IV 2 g (08/12/19 1024)   vancomycin 750 mg (08/12/19 1117)    Principal Problem:   Sepsis (HCC) Active Problems:   Hepatitis C   Cellulitis of right lower extremity   LOS: 2 days   How to contact the [facility]  Attending or Consulting provider 7A - 7P or covering provider during after hours 7P -7A, for this patient?  1. Check the care team in [facility] and look for a) attending/consulting [facility] provider listed and b) the [facility] team listed 2. Log into [email or web address] and use [place]'s universal password to access. If you do not have the password, please contact the hospital operator. 3. Locate the [facility] provider you are looking for under [facility] and page to a number that you can be directly reached. 4. If you still have difficulty reaching the provider, please page the [facility] (Director on Call)  for the Hospitalists listed on amion for assistance.
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
## 2019-08-13 ENCOUNTER — Inpatient Hospital Stay (HOSPITAL_COMMUNITY): Payer: Medicaid Other

## 2019-08-13 DIAGNOSIS — R609 Edema, unspecified: Secondary | ICD-10-CM

## 2019-08-13 DIAGNOSIS — I058 Other rheumatic mitral valve diseases: Secondary | ICD-10-CM

## 2019-08-13 LAB — SEDIMENTATION RATE: Sed Rate: 58 mm/hr — ABNORMAL HIGH (ref 0–22)

## 2019-08-13 LAB — C-REACTIVE PROTEIN: CRP: 5.7 mg/dL — ABNORMAL HIGH (ref <1.0)

## 2019-08-13 LAB — HEPATITIS A ANTIBODY, TOTAL: hep A Total Ab: NONREACTIVE

## 2019-08-13 NOTE — Discharge Summary (Signed)
Physician Discharge Summary  Summer Cain XJO:[id number] DOB: 1985/08/01 DOA: 08/10/2019  PCP: Patient, No Pcp Per  Admit date: 08/10/2019 Discharge date: 08/13/2019  Patient left AGAINST MEDICAL ADVICE    Discharge Diagnoses: Principal diagnosis is #1 1. Sepsis secondary to polymicrobial bacteremia with associated right lower extremity cellulitis and abscess, presumably secondary to IV drug use 2. 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 Hepatitis C untreated  Discharge Condition: stable Disposition: left AMA   Filed Weights   08/11/19 1537  Weight: 49.9 kg    History of present illness:  34yow PMH IVDU presented w/ fever, chills, multiple painful raised red areas, primarily legs. Admitted for RLE cellulitis   Hospital Course:  Treated empirically with IV antibiotics, early blood culture report positive for Serratia, Pseudomonas and MRSA. Culture pending. Seen by general surgery and orthopedics for areas concerning for abscess. Orthopedics recommended no intervention. Continue antibiotics. 2D echocardiogram concerning for mitral valve vegetation. Seen by infectious disease. Morning 7/26, RN notified me the patient was demanding to leave AMA. I spoke with patient at bedside, with bedside RN present, examined patient and discussed current diagnoses, severity of illness and need for inpatient IV antibiotics and further investigation. Patient reported withdrawal symptoms. I recommended initiating Suboxone and further management of pain and anxiety to control her symptoms and allow for treatment. Discussed specifically life-threatening infection, heart valve infection, very high risk of death (near certain) if left AMA and does not come back to hospital to seek treatment immediately. Patient clearly understood implications of her disease, nevertheless decided to leave anyway. She clearly had capacity.   Significant Hospital Events    Admitted 7/23  Consults:   General  surgery  Orthopedics  ID  Procedures:   None  Significant Diagnostic Tests:   see below   Micro Data:   7/24 BC > BCID MRSA, Serratia, Pseudomonas. BC Pseudomonas aeruginosa, Staph aureus, susceptibilities to follow  Antimicrobials:   Vancomycin 7/24 >  Cefepime 7/24 >  Today's assessment: S: feels anxious, pain in foot. Demanding to leave.  O: Vitals:  Vitals:   08/12/19 2115 08/13/19 0438  BP: 101/77 99/69  Pulse: 102 71  Resp: 16 18  Temp: 98.2 F (36.8 C) 98 F (36.7 C)  SpO2: 97% 98%    Constitutional:  . Appears irritable. Non-toxic. Ambulating w/o difficulty Respiratory:  . CTA bilaterally, no w/r/r.  . Respiratory effort normal.  Cardiovascular:  . RRR, no m/r/g Psychiatric:  . Mental status o Mood, affect appropriate o Clearly understands where she is and what medical issues are as well of risk of leaving  CRP 5.7 ESR 58  Discharge Instructions    Allergies  Allergen Reactions  . Latex Swelling    The results of significant diagnostics from this hospitalization (including imaging, microbiology, ancillary and laboratory) are listed below for reference.    Significant Diagnostic Studies: MR ANKLE RIGHT W WO CONTRAST  Result Date: 08/12/2019 CLINICAL DATA:  Ankle pain with fever and chills. IV drug abuse. Suspected septic arthritis. EXAM: MRI OF THE RIGHT ANKLE WITHOUT AND WITH CONTRAST TECHNIQUE: Multiplanar, multisequence MR imaging of the ankle was performed before and after the administration of intravenous contrast. CONTRAST:  91m GADAVIST GADOBUTROL 1 MMOL/ML IV SOLN COMPARISON:  Radiographs same date. FINDINGS: TENDONS Peroneal: Intact and normally positioned. There is mild flattening of the peroneus brevis tendon without longitudinal split tear or significant tenosynovitis. Posteromedial: Intact and normally positioned. Anterior: Intact. There are inflammatory changes surrounding the extensor tendons with mild extensor digitorum  tenosynovitis and synovial enhancement. Achilles: Intact. Plantar Fascia: Intact. LIGAMENTS Lateral: The anterior and posterior talofibular and calcaneofibular ligaments are intact. Medial: The deltoid and visualized portions of the spring ligament appear intact. CARTILAGE AND BONES Ankle Joint: No ankle joint effusion or abnormal synovial enhancement. The talar dome and tibial plafond appear normal. Subtalar Joints/Sinus Tarsi: The subtalar joint appears normal. There is no edema or abnormal enhancement in the tarsal sinus. Bones: Patchy T2 hyperintensity in the distal tibia, talus and calcaneus, likely reactive. No cortical destruction, suspicious T1 signal abnormality or abnormal enhancement. Other: There is diffuse edema and heterogeneous enhancement within the subcutaneous tissues of the anterior lower leg, extending into the dorsum of the foot. At approximately the level of the ankle joint, there is a heterogeneous fluid collection superficial to the extensor digitorum longus tendons which measures approximately 2.2 x 1.1 x 1.9 cm. This demonstrates heterogeneous intermediate T1 signal and peripheral enhancement. This could reflect a hematoma or early abscess. This deforms the overlying skin surface. No other focal fluid collection or foreign body. IMPRESSION: IMPRESSION 1. Diffuse edema and heterogeneous enhancement within the subcutaneous tissues of the anterior lower leg, extending into the dorsum of the foot, suspicious for cellulitis. Small heterogeneous fluid collection superficial to the extensor digitorum longus tendons could reflect a hematoma or early abscess. 2. No evidence of osteomyelitis or septic joint. 3. Mild extensor digitorum tenosynovitis and synovial enhancement, potentially infectious. Electronically Signed   By: Richardean Sale M.D.   On: 08/12/2019 15:23   DG Chest Portable 1 View  Result Date: 08/10/2019 CLINICAL DATA:  Fever history of IV drug use EXAM: PORTABLE CHEST 1 VIEW  COMPARISON:  None. FINDINGS: The heart size and mediastinal contours are within normal limits. Both lungs are clear. Scoliosis of the spine. IMPRESSION: No active disease. Electronically Signed   By: Donavan Foil M.D.   On: 08/10/2019 23:22   DG Ankle Right Port  Result Date: 08/12/2019 CLINICAL DATA:  Open wound right ankle with bacteremia. EXAM: PORTABLE RIGHT ANKLE - 2 VIEW COMPARISON:  None. FINDINGS: There is no evidence of fracture, dislocation, or joint effusion. There is no evidence of arthropathy or other focal bone abnormality. Oval nonspecific soft tissue calcification over the dorsal soft tissues at the level of the distal talus. IMPRESSION: No acute findings. Electronically Signed   By: Marin Olp M.D.   On: 08/12/2019 11:17   ECHOCARDIOGRAM COMPLETE  Result Date: 08/12/2019    ECHOCARDIOGRAM REPORT   Patient Name:   BRYLYNN HANSSEN Date of Exam: 08/12/2019 Medical Rec #:  [id number]       Height:       61.0 in Accession #:    [id number]      Weight:       110.0 lb Date of Birth:  [birth year]       BSA:          1.465 m Patient Age:    34 years        BP:           96/61 mmHg Patient Gender: F               HR:           63 bpm. Exam Location:  Inpatient Procedure: 2D Echo, Cardiac Doppler and Color Doppler Indications:    Bacteremia 790.7/ R78.81  History:        Patient has no prior history of Echocardiogram examinations.  Sonographer:    [place] Referring  Phys: [id number] [place]  1. Left ventricular ejection fraction, by estimation, is 60 to 65%. The left ventricle has normal function. The left ventricle has no regional wall motion abnormalities. Left ventricular diastolic parameters were normal.  2. Prominent moderator band. Right ventricular systolic function is normal. The right ventricular size is normal. There is normal pulmonary artery systolic pressure. The estimated right ventricular systolic pressure is 32.3 mmHg.  3. The mitral valve is abnormal. Mild  mitral valve regurgitation. No evidence of mitral stenosis.  4. The aortic valve is normal in structure. Aortic valve regurgitation is not visualized. No aortic stenosis is present.  5. The inferior vena cava is normal in size with greater than 50% respiratory variability, suggesting right atrial pressure of 3 mmHg. Conclusion(s)/Recommendation(s): Findings concerning for possible mitral valve vegetation, with thickening and a possible small mobile mass on the AMVL with associated mild MR - this could also be cord structure or artifact. Although this would not be typical of IVDU, would recommend a Transesophageal Echocardiogram for further clarification if clinical suspicion for endocarditis is high with appropriate bacteremia. FINDINGS  Left Ventricle: Left ventricular ejection fraction, by estimation, is 60 to 65%. The left ventricle has normal function. The left ventricle has no regional wall motion abnormalities. The left ventricular internal cavity size was normal in size. There is  no left ventricular hypertrophy. Left ventricular diastolic parameters were normal. Right Ventricle: Prominent moderator band. The right ventricular size is normal. No increase in right ventricular wall thickness. Right ventricular systolic function is normal. There is normal pulmonary artery systolic pressure. The tricuspid regurgitant  velocity is 1.63 m/s, and with an assumed right atrial pressure of 3 mmHg, the estimated right ventricular systolic pressure is 55.7 mmHg. Left Atrium: Left atrial size was normal in size. Right Atrium: Right atrial size was normal in size. Pericardium: There is no evidence of pericardial effusion. Mitral Valve: The mitral valve is abnormal. There is moderate thickening of the mitral valve leaflet(s). Normal mobility of the mitral valve leaflets. A possible small vegetation is seen on the anterior mitral leaflet. The MV vegetation measures 5 mm x 5  mm. (image 66). This could also be an echogenic  cord structure or possibly artifact. Mild mitral valve regurgitation. No evidence of mitral valve stenosis. Tricuspid Valve: The tricuspid valve is normal in structure. Tricuspid valve regurgitation is trivial. No evidence of tricuspid stenosis. Aortic Valve: The aortic valve is normal in structure. Aortic valve regurgitation is not visualized. No aortic stenosis is present. Pulmonic Valve: The pulmonic valve was normal in structure. Pulmonic valve regurgitation is not visualized. No evidence of pulmonic stenosis. Aorta: The aortic root is normal in size and structure. Venous: The inferior vena cava is normal in size with greater than 50% respiratory variability, suggesting right atrial pressure of 3 mmHg. IAS/Shunts: No atrial level shunt detected by color flow Doppler.  LEFT VENTRICLE PLAX 2D LVIDd:         4.50 cm     Diastology LVIDs:         3.00 cm     LV e' lateral:   12.90 cm/s LV PW:         0.60 cm     LV E/e' lateral: 8.2 LV IVS:        0.60 cm     LV e' medial:    8.05 cm/s LVOT diam:     1.70 cm     LV E/e' medial:  13.2 LV SV:  45 LV SV Index:   31 LVOT Area:     2.27 cm  LV Volumes (MOD) LV vol d, MOD A2C: 87.3 ml LV vol d, MOD A4C: 80.7 ml LV vol s, MOD A2C: 30.4 ml LV vol s, MOD A4C: 23.2 ml LV SV MOD A2C:     56.9 ml LV SV MOD A4C:     80.7 ml LV SV MOD BP:      58.5 ml RIGHT VENTRICLE             IVC RV Basal diam:  2.80 cm     IVC diam: 1.90 cm RV S prime:     13.40 cm/s TAPSE (M-mode): 2.2 cm LEFT ATRIUM             Index       RIGHT ATRIUM           Index LA diam:        3.20 cm 2.18 cm/m  RA Area:     10.90 cm LA Vol (A2C):   38.0 ml 25.94 ml/m RA Volume:   23.50 ml  16.04 ml/m LA Vol (A4C):   30.3 ml 20.68 ml/m LA Biplane Vol: 36.0 ml 24.57 ml/m  AORTIC VALVE LVOT Vmax:   91.80 cm/s LVOT Vmean:  62.300 cm/s LVOT VTI:    0.199 m  AORTA Ao Root diam: 2.60 cm Ao Asc diam:  2.70 cm MITRAL VALVE                 TRICUSPID VALVE MV Area (PHT): 3.54 cm      TR Peak grad:   10.6 mmHg MV  Decel Time: 214 msec      TR Vmax:        163.00 cm/s MR Peak grad:    68.2 mmHg MR Vmax:         413.00 cm/s SHUNTS MR PISA:         0.57 cm    Systemic VTI:  0.20 m MR PISA Eff ROA: 4 mm       Systemic Diam: 1.70 cm MR PISA Radius:  0.30 cm MV E velocity: 106.00 cm/s MV A velocity: 56.40 cm/s MV E/A ratio:  1.88 Lyman Bishop MD Electronically signed by Lyman Bishop MD Signature Date/Time: 08/12/2019/1:29:02 PM    Final    VAS [place] UPPER EXTREMITY VENOUS DUPLEX  Result Date: 08/13/2019 UPPER VENOUS STUDY  Indications: Edema Risk Factors: IV drug abuse. Limitations: Line, bandages and patient cooperation. Comparison Study: No prior study on file for comparison Performing Technologist: Sharion Dove RVS  Examination Guidelines: A complete evaluation includes B-mode imaging, spectral Doppler, color Doppler, and power Doppler as needed of all accessible portions of each vessel. Bilateral testing is considered an integral part of a complete examination. Limited examinations for reoccurring indications may be performed as noted.  Left Findings: +----------+------------+---------+-----------+----------+---------------------+ LEFT      CompressiblePhasicitySpontaneousProperties       Summary        +----------+------------+---------+-----------+----------+---------------------+ IJV                                                    Not visualized     +----------+------------+---------+-----------+----------+---------------------+ Subclavian  Not visualized     +----------+------------+---------+-----------+----------+---------------------+ Axillary                 Yes       Yes               patent by color and                                                             Doppler        +----------+------------+---------+-----------+----------+---------------------+ Brachial      Full       Yes       Yes                                     +----------+------------+---------+-----------+----------+---------------------+ Cephalic      Full                                                        +----------+------------+---------+-----------+----------+---------------------+ Basilic                  Yes       Yes                patent by Doppler   +----------+------------+---------+-----------+----------+---------------------+  Summary:  Left: No evidence of deep vein thrombosis in the upper extremity. No evidence of superficial vein thrombosis in the upper extremity. However, unable to visualize the IJV, subclavian, and not all segments of the brachial basilic veins.  *See table(s) above for measurements and observations.    Preliminary     Microbiology: Recent Results (from the past 240 hour(s))  SARS Coronavirus 2 by RT PCR (hospital order, performed in [facility] hospital lab) Nasopharyngeal Nasopharyngeal Swab     Status: None   Collection Time: 08/10/19 10:06 PM   Specimen: Nasopharyngeal Swab  Result Value Ref Range Status   SARS Coronavirus 2 NEGATIVE NEGATIVE Final    Comment: (NOTE) SARS-CoV-2 target nucleic acids are NOT DETECTED.  The SARS-CoV-2 RNA is generally detectable in upper and lower respiratory specimens during the acute phase of infection. The lowest concentration of SARS-CoV-2 viral copies this assay can detect is 250 copies / mL. A negative result does not preclude SARS-CoV-2 infection and should not be used as the sole basis for treatment or other patient management decisions.  A negative result may occur with improper specimen collection / handling, submission of specimen other than nasopharyngeal swab, presence of viral mutation(s) within the areas targeted by this assay, and inadequate number of viral copies (<250 copies / mL). A negative result must be combined with clinical observations, patient history, and epidemiological information.  Fact Sheet for Patients:     StrictlyIdeas.no  Fact Sheet for Healthcare Providers: BankingDealers.co.za  This test is not yet approved or  cleared by the [place] FDA and has been authorized for detection and/or diagnosis of SARS-CoV-2 by FDA under an Emergency Use Authorization (EUA).  This EUA will remain in effect (meaning this test can be used) for the duration of the COVID-19 declaration under Section 564(b)(1) of  the Act, 21 U.S.C. section 360bbb-3(b)(1), unless the authorization is terminated or revoked sooner.  Performed at [facility], [place] [address], [place], [place] [address]   Blood culture (routine x 2)     Status: Abnormal (Preliminary result)   Collection Time: 08/10/19 10:32 PM   Specimen: BLOOD  Result Value Ref Range Status   Specimen Description   Final    BLOOD LEFT HAND Performed at [facility] [address] [place], [place] [address]    Special Requests   Final    BOTTLES DRAWN AEROBIC AND ANAEROBIC Blood Culture adequate volume Performed at [facility] [address], [place], [place] [address]    Culture  Setup Time   Final    GRAM NEGATIVE RODS GRAM POSITIVE COCCI IN BOTH AEROBIC AND ANAEROBIC BOTTLES CRITICAL RESULT CALLED TO, READ BACK BY AND VERIFIED WITH: PHARMD A PHAN [id number] AT 9562 BY CM Performed at [facility], [place] [address], [place], [place] [address]    Culture (A)  Final    PSEUDOMONAS AERUGINOSA SUSCEPTIBILITIES TO FOLLOW STAPHYLOCOCCUS AUREUS    Report Status PENDING  Incomplete  Blood Culture ID Panel (Reflexed)     Status: Abnormal   Collection Time: 08/10/19 10:32 PM  Result Value Ref Range Status   Enterococcus species NOT DETECTED NOT DETECTED Final   Listeria monocytogenes NOT DETECTED NOT DETECTED Final   Staphylococcus species DETECTED (A) NOT DETECTED Final    Comment: CRITICAL RESULT CALLED TO, READ BACK BY AND VERIFIED  WITH: PHARMD A PHAN [id number] AT 1455 BY CM    Staphylococcus aureus (BCID) DETECTED (A) NOT DETECTED Final    Comment: Methicillin (oxacillin)-resistant Staphylococcus aureus (MRSA). MRSA is predictably resistant to beta-lactam antibiotics (except ceftaroline). Preferred therapy is vancomycin unless clinically contraindicated. Patient requires contact precautions if  hospitalized. CRITICAL RESULT CALLED TO, READ BACK BY AND VERIFIED WITH: PHARMD A PHAN [id number] AT 1455 BY CM    Methicillin resistance DETECTED (A) NOT DETECTED Final    Comment: CRITICAL RESULT CALLED TO, READ BACK BY AND VERIFIED WITH: PHARMD A PHAN [id number] AT 1455 BY CM    Streptococcus species NOT DETECTED NOT DETECTED Final   Streptococcus agalactiae NOT DETECTED NOT DETECTED Final   Streptococcus pneumoniae NOT DETECTED NOT DETECTED Final   Streptococcus pyogenes NOT DETECTED NOT DETECTED Final   Acinetobacter baumannii NOT DETECTED NOT DETECTED Final   Enterobacteriaceae species DETECTED (A) NOT DETECTED Final    Comment: Enterobacteriaceae represent a large family of gram-negative bacteria, not a single organism. CRITICAL RESULT CALLED TO, READ BACK BY AND VERIFIED WITH: PHARMD A PHAN [id number] AT 1455 BY CM    Enterobacter cloacae complex NOT DETECTED NOT DETECTED Final   Escherichia coli NOT DETECTED NOT DETECTED Final   Klebsiella oxytoca NOT DETECTED NOT DETECTED Final   Klebsiella pneumoniae NOT DETECTED NOT DETECTED Final   Proteus species NOT DETECTED NOT DETECTED Final   Serratia marcescens DETECTED (A) NOT DETECTED Final    Comment: CRITICAL RESULT CALLED TO, READ BACK BY AND VERIFIED WITH: PHARMD A PHAN [id number] AT 1455 BY CM    Carbapenem resistance NOT DETECTED NOT DETECTED Final   Haemophilus influenzae NOT DETECTED NOT DETECTED Final   Neisseria meningitidis NOT DETECTED NOT DETECTED Final   Pseudomonas aeruginosa DETECTED (A) NOT DETECTED Final    Comment: CRITICAL RESULT CALLED TO, READ BACK BY AND  VERIFIED WITH: PHARMD A PHAN [id number] AT 1455 BY CM    Candida albicans  NOT DETECTED NOT DETECTED Final   Candida glabrata NOT DETECTED NOT DETECTED Final   Candida krusei NOT DETECTED NOT DETECTED Final   Candida parapsilosis NOT DETECTED NOT DETECTED Final   Candida tropicalis NOT DETECTED NOT DETECTED Final    Comment: Performed at [facility], [place] [address] [place],  [address]     Labs: Basic Metabolic Panel: Recent Labs  Lab 08/10/19 1629 08/11/19 0500 08/12/19 0812  NA 138 136 137  K 4.2 4.5 5.0  CL 102 102 108  CO2 27 24 20*  GLUCOSE 97 87 86  BUN '10 11 7  ' CREATININE 0.70 0.60 0.49  CALCIUM 9.0 8.5* 8.5*   CBC: Recent Labs  Lab 08/10/19 1629 08/11/19 0500 08/12/19 0812  WBC 8.9 10.9* 8.2  HGB 12.8 12.3 10.4*  HCT 40.9 39.7 32.1*  MCV 87.4 87.4 82.9  PLT 279 274 298    CBG: Recent Labs  Lab 08/12/19 0424  GLUCAP 99    Principal Problem:   Sepsis ([place]) Active Problems:   Hepatitis C   Cellulitis of right lower extremity   Time: 35 minutes  Signed:  Murray Hodgkins, MD  [facility]  08/13/2019, 1:04 PM

## 2019-08-13 NOTE — Progress Notes (Signed)
Patient left the hospital AMA. Dr. Irene Limbo came spoke to patient.

## 2019-08-13 NOTE — Progress Notes (Signed)
VASCULAR LAB PRELIMINARY  PRELIMINARY  PRELIMINARY  PRELIMINARY  Left upper extremity venous duplex completed.    Preliminary report:  See CV proc for preliminary results.   Runa Whittingham, RVT 08/13/2019, 10:13 AM

## 2019-08-13 NOTE — Progress Notes (Signed)
RN found unopened 22 gauge needle in patient's bag of fruit loop cereal in room along with personal glucose monitor kit.   AC called to room and proceeded to go through the rest of patient's belonging in bags. Found more needles both used and unused. Informed patient that RN will place glucose kit/unused needles with rest of stored item until d/c. Will continue to mx.

## 2019-08-14 LAB — CULTURE, BLOOD (ROUTINE X 2): Special Requests: ADEQUATE

## 2019-08-14 SURGERY — ECHOCARDIOGRAM, TRANSESOPHAGEAL
Anesthesia: Monitor Anesthesia Care

## 2019-08-20 LAB — HEPATITIS C GENOTYPE

## 2019-09-15 ENCOUNTER — Encounter (HOSPITAL_COMMUNITY): Payer: Self-pay | Admitting: Emergency Medicine

## 2019-09-15 ENCOUNTER — Emergency Department (HOSPITAL_COMMUNITY): Payer: Medicaid Other

## 2019-09-15 ENCOUNTER — Inpatient Hospital Stay (HOSPITAL_COMMUNITY)
Admission: EM | Admit: 2019-09-15 | Discharge: 2019-09-20 | DRG: 981 | Disposition: A | Discharge status: Home / Self Care | Payer: Medicaid Other | Attending: Internal Medicine | Admitting: Internal Medicine

## 2019-09-15 ENCOUNTER — Inpatient Hospital Stay (HOSPITAL_COMMUNITY): Payer: Medicaid Other

## 2019-09-15 ENCOUNTER — Other Ambulatory Visit: Payer: Self-pay

## 2019-09-15 DIAGNOSIS — L810 Postinflammatory hyperpigmentation: Secondary | ICD-10-CM | POA: Diagnosis present

## 2019-09-15 DIAGNOSIS — S025XXA Fracture of tooth (traumatic), initial encounter for closed fracture: Secondary | ICD-10-CM | POA: Diagnosis present

## 2019-09-15 DIAGNOSIS — R079 Chest pain, unspecified: Secondary | ICD-10-CM | POA: Diagnosis not present

## 2019-09-15 DIAGNOSIS — F3132 Bipolar disorder, current episode depressed, moderate: Secondary | ICD-10-CM | POA: Diagnosis present

## 2019-09-15 DIAGNOSIS — R0789 Other chest pain: Secondary | ICD-10-CM | POA: Diagnosis not present

## 2019-09-15 DIAGNOSIS — I361 Nonrheumatic tricuspid (valve) insufficiency: Secondary | ICD-10-CM | POA: Diagnosis not present

## 2019-09-15 DIAGNOSIS — X58XXXA Exposure to other specified factors, initial encounter: Secondary | ICD-10-CM | POA: Diagnosis present

## 2019-09-15 DIAGNOSIS — Z23 Encounter for immunization: Secondary | ICD-10-CM

## 2019-09-15 DIAGNOSIS — R7881 Bacteremia: Secondary | ICD-10-CM | POA: Diagnosis present

## 2019-09-15 DIAGNOSIS — Z818 Family history of other mental and behavioral disorders: Secondary | ICD-10-CM

## 2019-09-15 DIAGNOSIS — L02413 Cutaneous abscess of right upper limb: Principal | ICD-10-CM | POA: Diagnosis present

## 2019-09-15 DIAGNOSIS — Z20822 Contact with and (suspected) exposure to covid-19: Secondary | ICD-10-CM | POA: Diagnosis present

## 2019-09-15 DIAGNOSIS — M419 Scoliosis, unspecified: Secondary | ICD-10-CM | POA: Diagnosis present

## 2019-09-15 DIAGNOSIS — L03113 Cellulitis of right upper limb: Secondary | ICD-10-CM | POA: Diagnosis present

## 2019-09-15 DIAGNOSIS — Z87442 Personal history of urinary calculi: Secondary | ICD-10-CM | POA: Diagnosis not present

## 2019-09-15 DIAGNOSIS — L089 Local infection of the skin and subcutaneous tissue, unspecified: Secondary | ICD-10-CM

## 2019-09-15 DIAGNOSIS — Z8614 Personal history of Methicillin resistant Staphylococcus aureus infection: Secondary | ICD-10-CM | POA: Diagnosis not present

## 2019-09-15 DIAGNOSIS — B965 Pseudomonas (aeruginosa) (mallei) (pseudomallei) as the cause of diseases classified elsewhere: Secondary | ICD-10-CM | POA: Diagnosis present

## 2019-09-15 DIAGNOSIS — F17210 Nicotine dependence, cigarettes, uncomplicated: Secondary | ICD-10-CM | POA: Diagnosis present

## 2019-09-15 DIAGNOSIS — F1190 Opioid use, unspecified, uncomplicated: Secondary | ICD-10-CM | POA: Diagnosis present

## 2019-09-15 DIAGNOSIS — Z91040 Latex allergy status: Secondary | ICD-10-CM | POA: Diagnosis not present

## 2019-09-15 DIAGNOSIS — Z8619 Personal history of other infectious and parasitic diseases: Secondary | ICD-10-CM | POA: Diagnosis not present

## 2019-09-15 DIAGNOSIS — F1114 Opioid abuse with opioid-induced mood disorder: Secondary | ICD-10-CM | POA: Diagnosis not present

## 2019-09-15 DIAGNOSIS — K047 Periapical abscess without sinus: Secondary | ICD-10-CM | POA: Diagnosis present

## 2019-09-15 DIAGNOSIS — F1123 Opioid dependence with withdrawal: Secondary | ICD-10-CM | POA: Diagnosis present

## 2019-09-15 DIAGNOSIS — L0889 Other specified local infections of the skin and subcutaneous tissue: Secondary | ICD-10-CM | POA: Diagnosis not present

## 2019-09-15 DIAGNOSIS — J189 Pneumonia, unspecified organism: Secondary | ICD-10-CM | POA: Diagnosis present

## 2019-09-15 DIAGNOSIS — F1990 Other psychoactive substance use, unspecified, uncomplicated: Secondary | ICD-10-CM

## 2019-09-15 DIAGNOSIS — I340 Nonrheumatic mitral (valve) insufficiency: Secondary | ICD-10-CM | POA: Diagnosis not present

## 2019-09-15 DIAGNOSIS — R1030 Lower abdominal pain, unspecified: Secondary | ICD-10-CM | POA: Diagnosis not present

## 2019-09-15 DIAGNOSIS — R0781 Pleurodynia: Secondary | ICD-10-CM | POA: Diagnosis not present

## 2019-09-15 DIAGNOSIS — R509 Fever, unspecified: Secondary | ICD-10-CM | POA: Diagnosis present

## 2019-09-15 DIAGNOSIS — F1120 Opioid dependence, uncomplicated: Secondary | ICD-10-CM | POA: Diagnosis present

## 2019-09-15 DIAGNOSIS — Z22322 Carrier or suspected carrier of Methicillin resistant Staphylococcus aureus: Secondary | ICD-10-CM

## 2019-09-15 DIAGNOSIS — Z9189 Other specified personal risk factors, not elsewhere classified: Secondary | ICD-10-CM | POA: Diagnosis not present

## 2019-09-15 LAB — URINALYSIS, ROUTINE W REFLEX MICROSCOPIC
Bacteria, UA: NONE SEEN
Bilirubin Urine: NEGATIVE
Glucose, UA: NEGATIVE mg/dL
Hgb urine dipstick: NEGATIVE
Ketones, ur: NEGATIVE mg/dL
Nitrite: NEGATIVE
Protein, ur: NEGATIVE mg/dL
Specific Gravity, Urine: 1.02 (ref 1.005–1.030)
pH: 5 (ref 5.0–8.0)

## 2019-09-15 LAB — CBC
HCT: 36.1 % (ref 36.0–46.0)
Hemoglobin: 11 g/dL — ABNORMAL LOW (ref 12.0–15.0)
MCH: 25.3 pg — ABNORMAL LOW (ref 26.0–34.0)
MCHC: 30.5 g/dL (ref 30.0–36.0)
MCV: 83.2 fL (ref 80.0–100.0)
Platelets: 278 10*3/uL (ref 150–400)
RBC: 4.34 MIL/uL (ref 3.87–5.11)
RDW: 14.3 % (ref 11.5–15.5)
WBC: 9.2 10*3/uL (ref 4.0–10.5)
nRBC: 0 % (ref 0.0–0.2)

## 2019-09-15 LAB — BASIC METABOLIC PANEL
Anion gap: 9 (ref 5–15)
BUN: 9 mg/dL (ref 6–20)
CO2: 27 mmol/L (ref 22–32)
Calcium: 9 mg/dL (ref 8.9–10.3)
Chloride: 99 mmol/L (ref 98–111)
Creatinine, Ser: 0.62 mg/dL (ref 0.44–1.00)
GFR calc Af Amer: >60 mL/min (ref >60)
GFR calc non Af Amer: >60 mL/min (ref >60)
Glucose, Bld: 88 mg/dL (ref 70–99)
Potassium: 3.3 mmol/L — ABNORMAL LOW (ref 3.5–5.1)
Sodium: 135 mmol/L (ref 135–145)

## 2019-09-15 LAB — LACTIC ACID, PLASMA
Lactic Acid, Venous: 1.1 mmol/L (ref 0.5–1.9)
Lactic Acid, Venous: 1.1 mmol/L (ref 0.5–1.9)

## 2019-09-15 LAB — SARS CORONAVIRUS 2 BY RT PCR (HOSPITAL ORDER, PERFORMED IN ~~LOC~~ HOSPITAL LAB): SARS Coronavirus 2: NEGATIVE

## 2019-09-15 LAB — PREGNANCY, URINE: Preg Test, Ur: NEGATIVE

## 2019-09-15 LAB — TROPONIN I (HIGH SENSITIVITY)
Troponin I (High Sensitivity): 2 ng/L (ref <18)
Troponin I (High Sensitivity): 4 ng/L (ref <18)

## 2019-09-15 MED ORDER — SODIUM CHLORIDE 0.9 % IV BOLUS
1000.0000 mL | Freq: Once | INTRAVENOUS | Status: AC
  Administered 2019-09-15: 1000 mL via INTRAVENOUS

## 2019-09-15 MED ORDER — VANCOMYCIN HCL 500 MG/100ML IV SOLN
500.0000 mg | Freq: Two times a day (BID) | INTRAVENOUS | Status: DC
  Administered 2019-09-16 – 2019-09-18 (×5): 500 mg via INTRAVENOUS
  Filled 2019-09-15 (×5): qty 100

## 2019-09-15 MED ORDER — VANCOMYCIN HCL IN DEXTROSE 1-5 GM/200ML-% IV SOLN
1000.0000 mg | Freq: Once | INTRAVENOUS | Status: AC
  Administered 2019-09-15: 1000 mg via INTRAVENOUS
  Filled 2019-09-15: qty 200

## 2019-09-15 MED ORDER — ENOXAPARIN SODIUM 40 MG/0.4ML ~~LOC~~ SOLN
40.0000 mg | SUBCUTANEOUS | Status: DC
  Administered 2019-09-15 – 2019-09-16 (×2): 40 mg via SUBCUTANEOUS
  Filled 2019-09-15 (×4): qty 0.4

## 2019-09-15 MED ORDER — KETOROLAC TROMETHAMINE 30 MG/ML IJ SOLN
30.0000 mg | Freq: Four times a day (QID) | INTRAMUSCULAR | Status: AC | PRN
  Administered 2019-09-15 – 2019-09-16 (×3): 30 mg via INTRAVENOUS
  Filled 2019-09-15 (×3): qty 1

## 2019-09-15 MED ORDER — NICOTINE 21 MG/24HR TD PT24
21.0000 mg | MEDICATED_PATCH | Freq: Every day | TRANSDERMAL | Status: DC
  Administered 2019-09-15 – 2019-09-20 (×6): 21 mg via TRANSDERMAL
  Filled 2019-09-15 (×7): qty 1

## 2019-09-15 MED ORDER — NICOTINE 21 MG/24HR TD PT24
21.0000 mg | MEDICATED_PATCH | Freq: Every day | TRANSDERMAL | Status: DC
  Filled 2019-09-15: qty 1

## 2019-09-15 MED ORDER — BUPRENORPHINE HCL-NALOXONE HCL 2-0.5 MG SL SUBL: 2.0000 | SUBLINGUAL_TABLET | SUBLINGUAL | Status: DC | PRN

## 2019-09-15 MED ORDER — BUPRENORPHINE HCL-NALOXONE HCL 8-2 MG SL SUBL: 1.0000 | SUBLINGUAL_TABLET | Freq: Two times a day (BID) | SUBLINGUAL | Status: DC

## 2019-09-15 MED ORDER — SODIUM CHLORIDE 0.9 % IV SOLN
2.0000 g | Freq: Three times a day (TID) | INTRAVENOUS | Status: DC
  Administered 2019-09-15 – 2019-09-18 (×9): 2 g via INTRAVENOUS
  Filled 2019-09-15 (×11): qty 2

## 2019-09-15 MED ORDER — TRAZODONE HCL 100 MG PO TABS
100.0000 mg | ORAL_TABLET | Freq: Every day | ORAL | Status: AC
  Administered 2019-09-15: 100 mg via ORAL
  Filled 2019-09-15: qty 1

## 2019-09-15 MED ORDER — TRAZODONE HCL 100 MG PO TABS: 100.0000 mg | ORAL_TABLET | Freq: Every day | ORAL | Status: DC

## 2019-09-15 MED ORDER — IOHEXOL 300 MG/ML  SOLN
100.0000 mL | Freq: Once | INTRAMUSCULAR | Status: AC | PRN
  Administered 2019-09-15: 100 mL via INTRAVENOUS

## 2019-09-15 NOTE — ED Triage Notes (Signed)
Per EMS, pt from home, c/o chest pain partially when exerting self.  She reports she needs treatment for "all my infections" including "my heart infections and a mass in my groin."  Pt has numerous skin abscesses.    124/88 100HR 16 RR 98% RA

## 2019-09-15 NOTE — ED Provider Notes (Signed)
[facility] Provider Note   CSN: [id number] Arrival date & time: 09/15/19  0355     History Chief Complaint  Patient presents with  . Chest Pain    Summer Cain is a 34 y.o. female.  She is an active IV heroin user.  She was admitted last month for cellulitis, found to be bacteremic and has what sounds like endocarditis.  She signed out AMA.  She is back again complaining of continued left-sided chest pain worse with breathing and moving, new abscesses on her arms, subjective fevers.  Last used few hours ago.  The history is provided by the patient.  Chest Pain Pain location:  L chest Pain quality: aching   Pain radiates to:  Mid back Pain severity:  Moderate Onset quality:  Gradual Timing:  Constant Progression:  Worsening Chronicity:  New Context: not trauma   Relieved by:  Nothing Worsened by:  Deep breathing, coughing and movement Ineffective treatments:  None tried Associated symptoms: back pain, cough, fever, nausea and shortness of breath   Associated symptoms: no abdominal pain, no headache and no lower extremity edema   Risk factors: smoking   Risk factors: no diabetes mellitus        Past Medical History:  Diagnosis Date  . Pregnant   . Scoliosis     Patient Active Problem List   Diagnosis Date Noted  . Sepsis (HCC) 08/11/2019  . Cellulitis of right lower extremity 08/10/2019  . Non-Purulent Cellulitis of left hand 04/01/2019  . Hepatitis 04/01/2019  . Substance use disorder 04/01/2019  . Bacteriuria with pyuria 04/01/2019  . Hepatitis C 04/01/2019  . Alcohol use disorder, severe, dependence (HCC) 06/06/2018  . Bipolar affective disorder, currently depressed, moderate (HCC) 06/06/2018  . 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 Moderate cannabis use disorder (HCC) 06/06/2018  . Moderate cocaine use disorder (HCC) 06/06/2018    Past Surgical History:  Procedure Laterality Date  . KIDNEY  STONE SURGERY       OB History    Gravida  7   Para  4   Term  1   Preterm  1   AB  3   Living  4     SAB  2   TAB  1   Ectopic  0   Multiple  0   Live Births  4           Family History  Problem Relation Age of Onset  . Hypertension Mother   . Depression Mother   . Hyperlipidemia Father   . Diabetes Paternal Grandmother     Social History   Tobacco Use  . Smoking status: Light Tobacco Smoker    Types: Cigarettes  . Smokeless tobacco: Never Used  . Tobacco comment: uses vape pen primarily now  Substance Use Topics  . Alcohol use: Yes    Comment: not since found out was pregnant at 2 mths.  . Drug use: Yes    Types: IV    Home Medications Prior to Admission medications   Not on File    Allergies    Latex  Review of Systems   Review of Systems  Constitutional: Positive for fever.  HENT: Negative for sore throat.   Eyes: Negative for visual disturbance.  Respiratory: Positive for cough and shortness of breath.   Cardiovascular: Positive for chest pain.  Gastrointestinal: Positive for nausea. Negative for abdominal pain.  Genitourinary: Negative for dysuria.  Musculoskeletal: Positive for back  pain.  Skin: Positive for wound. Negative for rash.  Neurological: Negative for headaches.    Physical Exam Updated Vital Signs BP (!) 89/68 (BP Location: Left Arm)   Pulse 83   Temp 98.4 F (36.9 C) (Oral)   Resp 14   Ht 5' (1.524 m)   Wt 49.9 kg   SpO2 100%   BMI 21.48 kg/m   Physical Exam Vitals and nursing note reviewed.  Constitutional:      General: She is not in acute distress.    Appearance: She is well-developed.  HENT:     Head: Normocephalic and atraumatic.  Eyes:     Conjunctiva/sclera: Conjunctivae normal.  Cardiovascular:     Rate and Rhythm: Normal rate and regular rhythm.     Heart sounds: Normal heart sounds. No murmur heard.   Pulmonary:     Effort: Pulmonary effort is normal. No respiratory distress.      Breath sounds: Normal breath sounds.  Abdominal:     Palpations: Abdomen is soft.     Tenderness: There is no abdominal tenderness.  Musculoskeletal:        General: Normal range of motion.     Cervical back: Neck supple.  Skin:    General: Skin is warm and dry.     Capillary Refill: Capillary refill takes less than 2 seconds.     Comments: Multiple old scars on her arm from injection drug use.  Few new superficial areas of induration  Neurological:     General: No focal deficit present.     Mental Status: She is alert.     ED Results / Procedures / Treatments   Labs (all labs ordered are listed, but only abnormal results are displayed) Labs Reviewed  BASIC METABOLIC PANEL - Abnormal; Notable for the following components:      Result Value   Potassium 3.3 (*)    All other components within normal limits  CBC - Abnormal; Notable for the following components:   Hemoglobin 11.0 (*)    MCH 25.3 (*)    All other components within normal limits  URINALYSIS, ROUTINE W REFLEX MICROSCOPIC - Abnormal; Notable for the following components:   Color, Urine AMBER (*)    APPearance HAZY (*)    Leukocytes,Ua SMALL (*)    All other components within normal limits  SARS CORONAVIRUS 2 BY RT PCR (HOSPITAL ORDER, PERFORMED IN [facility])  CULTURE, BLOOD (ROUTINE X 2)  CULTURE, BLOOD (ROUTINE X 2)  LACTIC ACID, PLASMA  LACTIC ACID, PLASMA  PREGNANCY, URINE  CBC  COMPREHENSIVE METABOLIC PANEL  TROPONIN I (HIGH SENSITIVITY)  TROPONIN I (HIGH SENSITIVITY)    EKG EKG Interpretation  Date/Time:  Saturday September 15 2019 04:11:33 EDT Ventricular Rate:  92 PR Interval:  142 QRS Duration: 72 QT Interval:  344 QTC Calculation: 425 R Axis:   77 Text Interpretation: Normal sinus rhythm Septal infarct , age undetermined Abnormal ECG When compared with ECG of 04/01/2019, No significant change was found Confirmed by Dione Booze ([id number]) on 09/15/2019 4:53:16 AM Also confirmed by  Meridee Score [phone])  on 09/15/2019 11:13:27 AM   Radiology DG Chest 2 View  Result Date: 09/15/2019 CLINICAL DATA:  Chest pain EXAM: CHEST - 2 VIEW COMPARISON:  08/10/2019 FINDINGS: Interstitial prominence with patchy ill-defined density. No pleural effusion. No pneumothorax. Cardiomediastinal contours are stable with normal heart size. Levoscoliosis of the lower thoracic spine. IMPRESSION: Interstitial opacities, which may reflect edema or atypical/viral pneumonia. Electronically Signed  By: Guadlupe Spanish M.D.   On: 09/15/2019 07:13   CT ABDOMEN PELVIS W CONTRAST  Result Date: 09/15/2019 CLINICAL DATA:  Right lower quadrant abdominal pain. Patient reports chest pain. Patient reports she needs treatment for "all my infections including my heart infections and mass in my groin ". Technologist notes state numerous skin abscesses. History of IV drug use. EXAM: CT ABDOMEN AND PELVIS WITH CONTRAST TECHNIQUE: Multidetector CT imaging of the abdomen and pelvis was performed using the standard protocol following bolus administration of intravenous contrast. CONTRAST:  OMNIPAQUE IOHEXOL 300 MG/ML  SOLN COMPARISON:  Right upper quadrant ultrasound 04/02/2019. FINDINGS: Lower chest: No pleural fluid. No confluent or nodular opacity in the lung bases. No obvious vegetation in the included heart. Hepatobiliary: Mild heterogeneity of hepatic parenchyma but no discrete focal lesion. Contracted gallbladder. No calcified gallstone. No biliary dilatation. Pancreas: No ductal dilatation or inflammation. Spleen: Enlarged spanning 14.4 cm cranial caudal. No focal abnormality. Adrenals/Urinary Tract: No adrenal nodule. No hydronephrosis or perinephric edema. Homogeneous renal enhancement. Small cyst in the lower right kidney is well as cortical hypodensity too small to accurately characterize. Urinary bladder is physiologically distended without wall thickening. Stomach/Bowel: Bowel assessment is limited in the  absence of enteric contrast and paucity of intra-abdominal fat. Ingested material distends the stomach. There is no gastric wall thickening. No small bowel obstruction. Equivocal wall thickening of the terminal ileum versus nondistention, series 3, image 63. The appendix is identified with moderate certainty, series 6, image 51, without evidence of appendicitis. Moderate volume of colonic stool. No abnormal rectal distention. Vascular/Lymphatic: Prominence of the gonadal veins. Normal caliber abdominal aorta. Patent portal vein. No bulky abdominopelvic adenopathy. No evidence of inguinal adenopathy. Reproductive: Retroverted uterus. There is prominent periuterine and adnexal vascularity. Dilatation of the ovarian veins measuring 6 mm on the left and 4 mm on the right. No adnexal mass. Other: Small amount of free fluid in the pelvis, may be reactive. No free air. No intra-abdominal or pelvic fluid collection. Small amount of air in the right anterior abdominal wall, series 3, image 59, nonspecific and may be related to subcutaneous injection. There is no evidence of subcutaneous fluid collection. 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 Mild scoliotic curvature of the lumbar spine. There is no bony destruction. No visualized intramuscular collection. IMPRESSION: 1. Equivocal wall thickening of the terminal ileum versus nondistention. In the setting of right lower quadrant pain this could represent mild enteritis, either infectious or inflammatory including Crohn's disease. 2. No evidence of appendicitis. 3. Prominent periuterine and adnexal vascularity with dilatation of the ovarian veins, can be seen with pelvic congestion syndrome. 4. Splenomegaly. 5. 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 6. Small amount of air in the right anterior abdominal wall is nonspecific and may be related to subcutaneous injection. No subcutaneous fluid collection.  Electronically Signed   By: Narda Rutherford M.D.   On: 09/15/2019 18:02    Procedures Procedures (including critical care time)  Medications Ordered in ED Medications  buprenorphine-naloxone (SUBOXONE) 2-0.5 mg per SL tablet 2 tablet (has no administration in time range)  buprenorphine-naloxone (SUBOXONE) 8-2 mg per SL tablet 1 tablet (has no administration in time range)  ceFEPIme (MAXIPIME) 2 g in sodium chloride 0.9 % 100 mL IVPB (2 g Intravenous New Bag/Given 09/15/19 2145)  vancomycin (VANCOREADY) IVPB 500 mg/100 mL (has no administration in time range)  enoxaparin (LOVENOX) injection 40 mg (40 mg Subcutaneous Given 09/15/19 1556)  nicotine (NICODERM CQ - dosed  in mg/24 hours) patch 21 mg (21 mg Transdermal Patch Applied 09/15/19 1557)  ketorolac (TORADOL) 30 MG/ML injection 30 mg (30 mg Intravenous Given 09/15/19 2316)  sodium chloride 0.9 % bolus 1,000 mL (0 mLs Intravenous Stopped 09/15/19 1551)  vancomycin (VANCOCIN) IVPB 1000 mg/200 mL premix (1,000 mg Intravenous New Bag/Given 09/15/19 1638)  iohexol (OMNIPAQUE) 300 MG/ML solution 100 mL (100 mLs Intravenous Contrast Given 09/15/19 1736)  traZODone (DESYREL) tablet 100 mg (100 mg Oral Given 09/15/19 2316)    ED Course  I have reviewed the triage vital signs and the nursing notes.  Pertinent labs & imaging results that were available during my care of the patient were reviewed by me and considered in my medical decision making (see chart for details).  Clinical Course as of Sep 14 2321  Sat Sep 15, 2019  1252 Reviewed patient's case with pharmacy who recommended restarting her on cefepime and Vanco which is what they were giving her during the last admission.  He also informed me about a Suboxone order set for her opiate withdrawal symptoms.   [MB]  1312 IV team was able to establish an IV on the patient.  The nurses, try to get a set of blood cultures and then start antibiotics.   [MB]  1320 Reviewed prior admission.  Patient was  culture positive for Pseudomonas, MRSA, and Serratia   [MB]  1328 TTE 7/21 -  Conclusion(s)/Recommendation(s): Findings concerning for possible mitral  valve vegetation, with thickening and a possible small mobile mass on the  AMVL with associated mild MR - this could also be cord structure or  artifact. Although this would not be  typical of IVDU, would recommend a Transesophageal Echocardiogram for  further clarification if clinical suspicion for endocarditis is high with  appropriate bacteremia.   [MB]  1443 Discussed with the internal medicine team who will evaluate the patient for admission.   [MB]    Clinical Course User Index [MB] Terrilee Files, MD   MDM Rules/Calculators/A&P                         This patient complains of subjective fevers, chest pain abdominal pain back pain, superficial abscesses; this involves an extensive number of treatment Options and is a complaint that carries with it a high risk of complications and Morbidity. The differential includes bacteremia, endocarditis, abscess, intra-abdominal infection, epidural abscess, polysubstance abuse  I ordered, reviewed and interpreted labs, which included CBC with normal white count, stable hemoglobin, chemistry with mildly low potassium of 3.3, troponin negative, lactate normal, blood cultures pending, Covid testing negative and pregnancy test negative I ordered medication IV fluids, Suboxone, IV antibiotics I ordered imaging studies which included chest x-ray and I independently    visualized and interpreted imaging which showed no acute infiltrates Previous records obtained and reviewed in epic including prior admission last month for similar symptoms I consulted internal medicine team and discussed lab and imaging findings  Critical Interventions: None  After the interventions stated above, I reevaluated the patient and found patient to be hemodynamically stable although blood pressure soft.  Similar to  prior blood pressures and lactate normal so doubt sepsis.  IV antibiotics established.  Will need admission for further management of likely bacteremia, endocarditis and possible infected abscesses.  Patient in agreement with plan for admission.  Discussed with internal medicine team for admission.   Final Clinical Impression(s) / ED Diagnoses Final diagnoses:  Nonspecific chest pain  Bacteremia  MRSA (methicillin resistant staph aureus) culture positive  IVDU (intravenous drug user)    Rx / DC Orders ED Discharge Orders    None       Terrilee Files, MD 09/15/19 2329
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
## 2019-09-15 NOTE — ED Notes (Signed)
Pt difficult stick.  First set blood cultures obtained from IV placed by IV team,

## 2019-09-15 NOTE — Progress Notes (Addendum)
Pharmacy Antibiotic Note  Summer Cain is a 34 y.o. female admitted on 09/15/2019 with bacteremia.  Pharmacy has been consulted for Cefepime and Vancomcyin dosing.   Height: 5' 0.25" (153 cm) Weight: 49.9 kg (110 lb) IBW/kg (Calculated) : 46.08  Temp (24hrs), Avg:98.3 F (36.8 C), Min:98.1 F (36.7 C), Max:98.4 F (36.9 C)  Recent Labs  Lab 09/15/19 1254  WBC 9.2  CREATININE 0.62  LATICACIDVEN 1.1    Estimated Creatinine Clearance: 73.5 mL/min (by C-G formula based on SCr of 0.62 mg/dL).    Allergies  Allergen Reactions  . Latex Swelling    Antimicrobials this admission: 8/28 Cefepime >>  8/28 Vancomycin >>   Dose adjustments this admission: N/a  Microbiology results: Pending Last admission at [facility] grew pseudomonas, MRSA, and serratia    Plan:  - Cefepime 2g IV q8h - Vancomycin 1000mg  IV x 1 dose  - Followed by Vancomycin 500mg  IV q24h (nomogram dosing)  - Goal trough 15-20 - Monitor patients renal function and urine output  - De-escalate ABX when appropriate   Thank you for allowing pharmacy to be a part of this patient's care.  PharmD. BCPS 09/15/2019 2:37 PM

## 2019-09-16 DIAGNOSIS — L089 Local infection of the skin and subcutaneous tissue, unspecified: Secondary | ICD-10-CM | POA: Diagnosis present

## 2019-09-16 DIAGNOSIS — L0889 Other specified local infections of the skin and subcutaneous tissue: Secondary | ICD-10-CM

## 2019-09-16 DIAGNOSIS — Z9189 Other specified personal risk factors, not elsewhere classified: Secondary | ICD-10-CM

## 2019-09-16 DIAGNOSIS — F1114 Opioid abuse with opioid-induced mood disorder: Secondary | ICD-10-CM

## 2019-09-16 DIAGNOSIS — K047 Periapical abscess without sinus: Secondary | ICD-10-CM

## 2019-09-16 DIAGNOSIS — Z8619 Personal history of other infectious and parasitic diseases: Secondary | ICD-10-CM

## 2019-09-16 DIAGNOSIS — F1120 Opioid dependence, uncomplicated: Secondary | ICD-10-CM | POA: Diagnosis present

## 2019-09-16 DIAGNOSIS — R509 Fever, unspecified: Secondary | ICD-10-CM

## 2019-09-16 DIAGNOSIS — R079 Chest pain, unspecified: Secondary | ICD-10-CM

## 2019-09-16 DIAGNOSIS — R1030 Lower abdominal pain, unspecified: Secondary | ICD-10-CM

## 2019-09-16 DIAGNOSIS — F1190 Opioid use, unspecified, uncomplicated: Secondary | ICD-10-CM | POA: Diagnosis present

## 2019-09-16 LAB — COMPREHENSIVE METABOLIC PANEL
ALT: 33 U/L (ref 0–44)
AST: 27 U/L (ref 15–41)
Albumin: 2.7 g/dL — ABNORMAL LOW (ref 3.5–5.0)
Alkaline Phosphatase: 73 U/L (ref 38–126)
Anion gap: 9 (ref 5–15)
BUN: <5 mg/dL — ABNORMAL LOW (ref 6–20)
CO2: 23 mmol/L (ref 22–32)
Calcium: 8.9 mg/dL (ref 8.9–10.3)
Chloride: 109 mmol/L (ref 98–111)
Creatinine, Ser: 0.58 mg/dL (ref 0.44–1.00)
GFR calc Af Amer: >60 mL/min (ref >60)
GFR calc non Af Amer: >60 mL/min (ref >60)
Glucose, Bld: 147 mg/dL — ABNORMAL HIGH (ref 70–99)
Potassium: 4 mmol/L (ref 3.5–5.1)
Sodium: 141 mmol/L (ref 135–145)
Total Bilirubin: 0.4 mg/dL (ref 0.3–1.2)
Total Protein: 6.6 g/dL (ref 6.5–8.1)

## 2019-09-16 LAB — CBC
HCT: 41.9 % (ref 36.0–46.0)
Hemoglobin: 13 g/dL (ref 12.0–15.0)
MCH: 25.5 pg — ABNORMAL LOW (ref 26.0–34.0)
MCHC: 31 g/dL (ref 30.0–36.0)
MCV: 82.2 fL (ref 80.0–100.0)
Platelets: 347 10*3/uL (ref 150–400)
RBC: 5.1 MIL/uL (ref 3.87–5.11)
RDW: 14.5 % (ref 11.5–15.5)
WBC: 7.5 10*3/uL (ref 4.0–10.5)
nRBC: 0 % (ref 0.0–0.2)

## 2019-09-16 MED ORDER — BUPRENORPHINE HCL-NALOXONE HCL 8-2 MG SL SUBL: 1.0000 | SUBLINGUAL_TABLET | Freq: Two times a day (BID) | SUBLINGUAL | Status: DC

## 2019-09-16 MED ORDER — BUPRENORPHINE HCL-NALOXONE HCL 2-0.5 MG SL SUBL
2.0000 | SUBLINGUAL_TABLET | SUBLINGUAL | Status: AC
  Administered 2019-09-16 (×2): 2 via SUBLINGUAL
  Filled 2019-09-16 (×3): qty 2

## 2019-09-16 MED ORDER — BUPRENORPHINE HCL-NALOXONE HCL 2-0.5 MG SL SUBL: 2.0000 | SUBLINGUAL_TABLET | SUBLINGUAL | Status: DC

## 2019-09-16 MED ORDER — QUETIAPINE FUMARATE 50 MG PO TABS
25.0000 mg | ORAL_TABLET | Freq: Every day | ORAL | Status: DC
  Administered 2019-09-16 – 2019-09-17 (×2): 25 mg via ORAL
  Filled 2019-09-16 (×2): qty 1

## 2019-09-16 MED ORDER — BUPRENORPHINE HCL-NALOXONE HCL 8-2 MG SL SUBL
1.0000 | SUBLINGUAL_TABLET | Freq: Two times a day (BID) | SUBLINGUAL | Status: DC
  Administered 2019-09-17 – 2019-09-18 (×3): 1 via SUBLINGUAL
  Filled 2019-09-16 (×3): qty 1

## 2019-09-16 MED ORDER — SODIUM CHLORIDE 0.9 % IV BOLUS
1000.0000 mL | Freq: Once | INTRAVENOUS | Status: AC
  Administered 2019-09-16: 1000 mL via INTRAVENOUS

## 2019-09-16 MED ORDER — BUPRENORPHINE HCL-NALOXONE HCL 8-2 MG SL SUBL: 1.0000 | SUBLINGUAL_TABLET | Freq: Every day | SUBLINGUAL | Status: DC

## 2019-09-16 MED ORDER — TRAZODONE HCL 100 MG PO TABS
100.0000 mg | ORAL_TABLET | Freq: Every day | ORAL | Status: DC
  Administered 2019-09-16 – 2019-09-19 (×4): 100 mg via ORAL
  Filled 2019-09-16 (×4): qty 1

## 2019-09-16 NOTE — TOC Initial Note (Signed)
Transition of Care [facility]) - Initial/Assessment Note    Patient Details  Name: Summer Cain MRN: [id number] Date of Birth: 07-18-1985  Transition of Care [facility]) CM/SW Contact:    Oretha Milch, LCSW Phone Number: 09/16/2019, 2:28 PM  Clinical Narrative:                  CSW consulted for substance use resources. CSW met with patient and provided resources. CSW notes that patient is interested in long term residential treatment and has history of outpatient treatment for suboxone at [facility]. Patient reports she is currently with [facility] outpatient mental health and has been talking to them about going into inpatient residential substance use program. Contact TOC for any follow up and support.   Expected Discharge Plan: [place] (Pt reports she plans on going to long term rehab post hospital) Barriers to Discharge: Continued Medical Work up   Patient Goals and CMS Choice Patient states their goals for this hospitalization and ongoing recovery are:: I want to go into long term treatment from the hospital      Expected Discharge Plan and Services Expected Discharge Plan: [place] (Pt reports she plans on going to long term rehab post hospital)                                              Prior Living Arrangements/Services   Lives with:: Self Patient language and need for interpreter reviewed:: Yes Do you feel safe going back to the place where you live?: No   Pt expressed concern that if she returns home she could relapse        Criminal Activity/Legal Involvement Pertinent to Current Situation/Hospitalization: Yes - Comment as needed (patient has several pending drug and larceny charges; felony and misdemeanor)  Activities of Daily Living Home Assistive Devices/Equipment: None ADL Screening (condition at time of admission) Patient's cognitive ability adequate to safely complete daily activities?: Yes Is the patient deaf or have difficulty  hearing?: No Does the patient have difficulty seeing, even when wearing glasses/contacts?: No Does the patient have difficulty concentrating, remembering, or making decisions?: No Patient able to express need for assistance with ADLs?: Yes Does the patient have difficulty dressing or bathing?: No Independently performs ADLs?: Yes (appropriate for developmental age) Does the patient have difficulty walking or climbing stairs?: No Weakness of Legs: None Weakness of Arms/Hands: None  Permission Sought/Granted                  Emotional Assessment Appearance:: Appears stated age Attitude/Demeanor/Rapport: Engaged Affect (typically observed): Anxious Orientation: : Oriented to Self, Oriented to Place, Oriented to  Time, Oriented to Situation Alcohol / Substance Use: Illicit Drugs Psych Involvement: Outpatient Provider  Admission diagnosis:  Bacteremia [R78.81] Pleuritic chest pain [R07.81] IVDU (intravenous drug user) [F19.90] MRSA (methicillin resistant staph aureus) culture positive [Z22.322] Nonspecific chest pain [R07.9] Patient Active Problem List   Diagnosis Date Noted   Opioid use disorder ([place]) 09/16/2019   Soft tissue infection 09/16/2019   Pleuritic chest pain 09/15/2019   Cellulitis of right lower extremity 08/10/2019   Hepatitis 04/01/2019   Substance use disorder 04/01/2019   Bacteriuria with pyuria 04/01/2019   Hepatitis C 04/01/2019   Alcohol use disorder, severe, dependence ([place]) 06/06/2018   Bipolar affective disorder, currently depressed, moderate ([place]) 06/06/2018   Moderate cocaine use disorder ([place]) 06/06/2018  PCP:  Patient, No Pcp Per Pharmacy:   [id number] [address], [place] [address] [place] [address] Phone: [phone] Fax: [phone]     Social Determinants of Health (SDOH) Interventions    Readmission Risk Interventions No flowsheet  data found.

## 2019-09-16 NOTE — Progress Notes (Signed)
Subjective:   Summer Cain was seen and evaluated at bedside on morning rounds.  Having light sensitivity and significant chest pain. She mentions last hospitalization, she started suboxone before 48 hours after her last use which worsened her withdrawal. She would prefer to try to wait before starting suboxone.  Feels her area on her right arm is about the same as it was yesterday.  Low back feels a little worse than normal, but attributes that to sleeping differently.   Objective:  Vital signs in last 24 hours: Vitals:   09/16/19 0206 09/16/19 0410 09/16/19 0800 09/16/19 1251  BP: (!) 87/50 (!) 87/50 (!) 89/58 99/71  Pulse: 63 67 61 70  Resp: 16 16 15 15   Temp: 97.7 F (36.5 C) 98.7 F (37.1 C) 97.9 F (36.6 C) 97.9 F (36.6 C)  TempSrc: Oral Oral Oral Oral  SpO2: 98% 97% 97% 100%  Weight:      Height:       Constitutional: sleeping comfortably when we first walk in, arouses to voice; appears sleepy; well-appearing though disheveled young woman lying in bed, in no acute distress Eyes: Covers her eyes when lights are turned on, squinting otherwise, complaining of light sensitivity Cardiovascular: holosystolic murmur heard best over the left upper sternal border Skin: Several subcutaneous nodules along both upper extremities; the largest is just medial to the R antecubital fossa is warm to touch, erythematous, slightly indurated, and tender to palpation; no apparent underlying fluctuance. Track marks evident on the anterior aspect of her forearms bilaterally.  Assessment/Plan:  Principal Problem:   Soft tissue infection Active Problems:   Bipolar affective disorder, currently depressed, moderate (HCC)   Pleuritic chest pain   Opioid use disorder (HCC)  Summer Cain is a 34 year old person who injects drugs with history of scoliosis, bacteremia, mitral valve endocarditis, and hepatitis C who presented to the [facility] ED with subjective fevers and chest pain admitted to  our service for high risk of bacteremia.   Risk of bacteremia Patient received incomplete treatment of polymicrobial bacteremia (Serratia, Pseudomonas and MRSA) at the end of July. Has remained afebrile here without leukocytosis. Will continue with empiric antibiotics while awaiting results of blood cultures obtained on admission. Risk for endocarditis. Patient has holosystolic murmur. Will consider TEE during this admission for definitive diagnosis of possible vegetation depending on culture results. - 8/28 blood cultures pending  - IV cefepime and vancomycin day 2 (Day 1: 8/28)  Skin and soft tissue infection Present medial to the R AC fossa. Will continue with IV vancomycin today and if no improvement by tomorrow, will plan for consult to general surgery to evaluate for possible I&D. - IV cefepime and vancomycin day 2 (Day 1: 8/28)  Opioid use disorder Last use ~2 am on 8/28. Patient at high risk for opioid withdrawal. She reports her withdrawal historically ensues after ~48 hours of last use given fentanyl use.  - start Suboxone 4 mg tonight (8/29) around 8 pm - re-dose another 4 mg a few hours later - transition to Suboxone 8 mg BID starting Monday morning  Mood disorder Patient reports she plans to return to [facility] for psychiatric and substance abuse services after this admission. Reports taking Seroquel and trazodone in the past prior to her most recent relapse. 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  - Outpatient f/u  Broken and infected incisor Given history of draining  abscess in this area and continued purulence, it is likely that this tooth is infected. Will monitor for improvement on antibiotics, but it will likely require eventual dental intervention.    Prior to Admission Living Arrangement: home Anticipated Discharge Location: home Barriers to Discharge:  completion of IV antibiotic treatment for possible bacteremia/endocarditis  Alphonzo Severance, MD 09/16/2019, 1:17 PM Pager: [phone] After 5pm on weekdays and 1pm on weekends: On Call pager [phone]

## 2019-09-17 ENCOUNTER — Inpatient Hospital Stay (HOSPITAL_COMMUNITY): Payer: Medicaid Other | Admitting: Anesthesiology

## 2019-09-17 ENCOUNTER — Encounter (HOSPITAL_COMMUNITY)
Admission: EM | Disposition: A | Discharge status: Home / Self Care | Payer: Self-pay | Source: Home / Self Care | Attending: Internal Medicine

## 2019-09-17 DIAGNOSIS — R0781 Pleurodynia: Secondary | ICD-10-CM

## 2019-09-17 DIAGNOSIS — R0789 Other chest pain: Secondary | ICD-10-CM

## 2019-09-17 HISTORY — PX: IRRIGATION AND DEBRIDEMENT ELBOW: SHX6886

## 2019-09-17 LAB — SURGICAL PCR SCREEN
MRSA, PCR: POSITIVE — AB
Staphylococcus aureus: POSITIVE — AB

## 2019-09-17 SURGERY — IRRIGATION AND DEBRIDEMENT ELBOW
Anesthesia: General | Site: Elbow | Laterality: Right

## 2019-09-17 MED ORDER — PROMETHAZINE HCL 25 MG/ML IJ SOLN: 6.2500 mg | INTRAMUSCULAR | Status: DC | PRN

## 2019-09-17 MED ORDER — HYDROMORPHONE HCL 1 MG/ML IJ SOLN
INTRAMUSCULAR | Status: AC
  Filled 2019-09-17: qty 0.5

## 2019-09-17 MED ORDER — KETOROLAC TROMETHAMINE 30 MG/ML IJ SOLN
30.0000 mg | Freq: Once | INTRAMUSCULAR | Status: AC
  Administered 2019-09-17: 30 mg via INTRAVENOUS
  Filled 2019-09-17: qty 1

## 2019-09-17 MED ORDER — HYDROMORPHONE HCL 1 MG/ML IJ SOLN
0.2500 mg | INTRAMUSCULAR | Status: DC | PRN
  Administered 2019-09-17: 0.5 mg via INTRAVENOUS

## 2019-09-17 MED ORDER — LORAZEPAM 2 MG/ML IJ SOLN
0.5000 mg | INTRAMUSCULAR | Status: DC | PRN
  Administered 2019-09-17 – 2019-09-20 (×9): 0.5 mg via INTRAVENOUS
  Filled 2019-09-17 (×9): qty 1

## 2019-09-17 MED ORDER — BUPRENORPHINE HCL-NALOXONE HCL 2-0.5 MG SL SUBL
2.0000 | SUBLINGUAL_TABLET | Freq: Once | SUBLINGUAL | Status: AC
  Administered 2019-09-17: 2 via SUBLINGUAL
  Filled 2019-09-17: qty 2

## 2019-09-17 MED ORDER — ONDANSETRON HCL 4 MG/2ML IJ SOLN
INTRAMUSCULAR | Status: DC | PRN
  Administered 2019-09-17: 4 mg via INTRAVENOUS

## 2019-09-17 MED ORDER — HYDROMORPHONE HCL 1 MG/ML IJ SOLN
1.0000 mg | Freq: Once | INTRAMUSCULAR | Status: AC
  Administered 2019-09-17: 1 mg via INTRAVENOUS
  Filled 2019-09-17: qty 1

## 2019-09-17 MED ORDER — FENTANYL CITRATE (PF) 250 MCG/5ML IJ SOLN
INTRAMUSCULAR | Status: AC
Start: 2019-09-17 — End: ?
  Filled 2019-09-17: qty 5

## 2019-09-17 MED ORDER — DEXMEDETOMIDINE (PRECEDEX) IN NS 20 MCG/5ML (4 MCG/ML) IV SYRINGE
PREFILLED_SYRINGE | INTRAVENOUS | Status: AC
  Filled 2019-09-17: qty 5

## 2019-09-17 MED ORDER — SODIUM CHLORIDE 0.9 % IR SOLN
Status: DC | PRN
  Administered 2019-09-17: 1000 mL

## 2019-09-17 MED ORDER — PROPOFOL 10 MG/ML IV BOLUS
INTRAVENOUS | Status: DC | PRN
  Administered 2019-09-17: 200 mg via INTRAVENOUS

## 2019-09-17 MED ORDER — HYDROMORPHONE HCL 1 MG/ML IJ SOLN
INTRAMUSCULAR | Status: AC
  Filled 2019-09-17: qty 1

## 2019-09-17 MED ORDER — DEXAMETHASONE SODIUM PHOSPHATE 4 MG/ML IJ SOLN
INTRAMUSCULAR | Status: DC | PRN
  Administered 2019-09-17: 4 mg via INTRAVENOUS

## 2019-09-17 MED ORDER — FENTANYL CITRATE (PF) 100 MCG/2ML IJ SOLN
INTRAMUSCULAR | Status: DC | PRN
Start: 2019-09-17 — End: 2019-09-17
  Administered 2019-09-17: 150 ug via INTRAVENOUS
  Administered 2019-09-17 (×2): 100 ug via INTRAVENOUS
  Administered 2019-09-17: 150 ug via INTRAVENOUS

## 2019-09-17 MED ORDER — FENTANYL CITRATE (PF) 250 MCG/5ML IJ SOLN
INTRAMUSCULAR | Status: AC
  Filled 2019-09-17: qty 5

## 2019-09-17 MED ORDER — MIDAZOLAM HCL 2 MG/2ML IJ SOLN: 0.5000 mg | Freq: Once | INTRAMUSCULAR | Status: DC | PRN

## 2019-09-17 MED ORDER — DEXMEDETOMIDINE (PRECEDEX) IN NS 20 MCG/5ML (4 MCG/ML) IV SYRINGE
PREFILLED_SYRINGE | INTRAVENOUS | Status: DC | PRN
  Administered 2019-09-17: 12 ug via INTRAVENOUS
  Administered 2019-09-17: 8 ug via INTRAVENOUS
  Administered 2019-09-17: 20 ug via INTRAVENOUS

## 2019-09-17 MED ORDER — CHLORHEXIDINE GLUCONATE 0.12 % MT SOLN: 15.0000 mL | Freq: Once | OROMUCOSAL | Status: AC

## 2019-09-17 MED ORDER — VANCOMYCIN HCL 500 MG IV SOLR
INTRAVENOUS | Status: AC
  Filled 2019-09-17: qty 500

## 2019-09-17 MED ORDER — MIDAZOLAM HCL 5 MG/5ML IJ SOLN
INTRAMUSCULAR | Status: DC | PRN
  Administered 2019-09-17: 2 mg via INTRAVENOUS

## 2019-09-17 MED ORDER — PROPOFOL 10 MG/ML IV BOLUS
INTRAVENOUS | Status: AC
  Filled 2019-09-17: qty 20

## 2019-09-17 MED ORDER — CLONIDINE HCL 0.1 MG PO TABS
0.1000 mg | ORAL_TABLET | Freq: Two times a day (BID) | ORAL | Status: DC
  Administered 2019-09-17 – 2019-09-20 (×7): 0.1 mg via ORAL
  Filled 2019-09-17 (×7): qty 1

## 2019-09-17 MED ORDER — LACTATED RINGERS IV SOLN: INTRAVENOUS | Status: DC

## 2019-09-17 MED ORDER — VANCOMYCIN HCL 500 MG IV SOLR
INTRAVENOUS | Status: DC | PRN
  Administered 2019-09-17: 500 mg via TOPICAL

## 2019-09-17 MED ORDER — LACTATED RINGERS IV SOLN: INTRAVENOUS | Status: DC | PRN

## 2019-09-17 MED ORDER — HYDROMORPHONE HCL 1 MG/ML IJ SOLN
INTRAMUSCULAR | Status: DC | PRN
  Administered 2019-09-17 (×2): .5 mg via INTRAVENOUS

## 2019-09-17 MED ORDER — MIDAZOLAM HCL 2 MG/2ML IJ SOLN
INTRAMUSCULAR | Status: AC
  Filled 2019-09-17: qty 2

## 2019-09-17 MED ORDER — MEPERIDINE HCL 25 MG/ML IJ SOLN: 6.2500 mg | INTRAMUSCULAR | Status: DC | PRN

## 2019-09-17 MED ORDER — ACETAMINOPHEN 325 MG PO TABS
650.0000 mg | ORAL_TABLET | Freq: Once | ORAL | Status: AC
  Administered 2019-09-17: 650 mg via ORAL
  Filled 2019-09-17: qty 2

## 2019-09-17 MED ORDER — MUPIROCIN 2 % EX OINT
1.0000 "application " | TOPICAL_OINTMENT | Freq: Two times a day (BID) | CUTANEOUS | Status: DC
  Administered 2019-09-18 – 2019-09-19 (×3): 1 via NASAL
  Filled 2019-09-17: qty 22

## 2019-09-17 MED ORDER — LIDOCAINE 2% (20 MG/ML) 5 ML SYRINGE
INTRAMUSCULAR | Status: DC | PRN
  Administered 2019-09-17: 40 mg via INTRAVENOUS

## 2019-09-17 MED ORDER — CHLORHEXIDINE GLUCONATE 0.12 % MT SOLN
OROMUCOSAL | Status: AC
  Administered 2019-09-17: 15 mL via OROMUCOSAL
  Filled 2019-09-17: qty 15

## 2019-09-17 SURGICAL SUPPLY — 57 items
BANDAGE ESMARK 6X9 LF (GAUZE/BANDAGES/DRESSINGS) IMPLANT
BLADE SURG 10 STRL SS (BLADE) ×3 IMPLANT
BNDG COHESIVE 4X5 TAN STRL (GAUZE/BANDAGES/DRESSINGS) ×3 IMPLANT
BNDG ELASTIC 4X5.8 VLCR STR LF (GAUZE/BANDAGES/DRESSINGS) ×3 IMPLANT
BNDG ELASTIC 6X5.8 VLCR STR LF (GAUZE/BANDAGES/DRESSINGS) ×1 IMPLANT
BNDG ESMARK 4X9 LF (GAUZE/BANDAGES/DRESSINGS) ×2 IMPLANT
BNDG ESMARK 6X9 LF (GAUZE/BANDAGES/DRESSINGS)
BNDG GAUZE ELAST 4 BULKY (GAUZE/BANDAGES/DRESSINGS) ×3 IMPLANT
CNTNR URN SCR LID CUP LEK RST (MISCELLANEOUS) IMPLANT
CONT SPEC 4OZ STRL OR WHT (MISCELLANEOUS) ×2
COVER MAYO STAND STRL (DRAPES) ×2 IMPLANT
COVER SURGICAL LIGHT HANDLE (MISCELLANEOUS) ×3 IMPLANT
COVER WAND RF STERILE (DRAPES) ×3 IMPLANT
CUFF TOURN SGL LL 12 NO SLV (MISCELLANEOUS) IMPLANT
CUFF TOURN SGL QUICK 34 (TOURNIQUET CUFF)
CUFF TRNQT CYL 34X4.125X (TOURNIQUET CUFF) IMPLANT
DRAPE SURG 17X23 STRL (DRAPES) IMPLANT
DRAPE U-SHAPE 47X51 STRL (DRAPES) IMPLANT
DRSG EMULSION OIL 3X3 NADH (GAUZE/BANDAGES/DRESSINGS) ×4 IMPLANT
DRSG PAD ABDOMINAL 8X10 ST (GAUZE/BANDAGES/DRESSINGS) ×1 IMPLANT
DURAPREP 26ML APPLICATOR (WOUND CARE) ×3 IMPLANT
ELECT REM PT RETURN 9FT ADLT (ELECTROSURGICAL)
ELECTRODE REM PT RTRN 9FT ADLT (ELECTROSURGICAL) IMPLANT
EVACUATOR 1/8 PVC DRAIN (DRAIN) IMPLANT
FACESHIELD WRAPAROUND (MASK) IMPLANT
FACESHIELD WRAPAROUND OR TEAM (MASK) ×1 IMPLANT
GAUZE SPONGE 4X4 12PLY STRL (GAUZE/BANDAGES/DRESSINGS) ×5 IMPLANT
GAUZE XEROFORM 1X8 LF (GAUZE/BANDAGES/DRESSINGS) ×1 IMPLANT
GLOVE BIO SURGEON STRL SZ7.5 (GLOVE) ×6 IMPLANT
GLOVE BIOGEL PI IND STRL 8 (GLOVE) ×2 IMPLANT
GLOVE BIOGEL PI INDICATOR 8 (GLOVE) ×4
GOWN STRL REUS W/ TWL LRG LVL3 (GOWN DISPOSABLE) ×3 IMPLANT
GOWN STRL REUS W/TWL LRG LVL3 (GOWN DISPOSABLE) ×4
HANDPIECE INTERPULSE COAX TIP (DISPOSABLE)
KIT BASIN OR (CUSTOM PROCEDURE TRAY) ×3 IMPLANT
KIT TURNOVER KIT B (KITS) ×3 IMPLANT
MANIFOLD NEPTUNE II (INSTRUMENTS) ×3 IMPLANT
NDL HYPO 25GX1X1/2 BEV (NEEDLE) IMPLANT
NEEDLE HYPO 25GX1X1/2 BEV (NEEDLE) IMPLANT
NS IRRIG 1000ML POUR BTL (IV SOLUTION) ×3 IMPLANT
PACK ORTHO EXTREMITY (CUSTOM PROCEDURE TRAY) ×3 IMPLANT
PAD ARMBOARD 7.5X6 YLW CONV (MISCELLANEOUS) ×6 IMPLANT
SET CYSTO W/LG BORE CLAMP LF (SET/KITS/TRAYS/PACK) IMPLANT
SET HNDPC FAN SPRY TIP SCT (DISPOSABLE) IMPLANT
SPONGE LAP 18X18 RF (DISPOSABLE) IMPLANT
STOCKINETTE IMPERVIOUS 9X36 MD (GAUZE/BANDAGES/DRESSINGS) ×3 IMPLANT
SUT ETHILON 3 0 PS 1 (SUTURE) ×2 IMPLANT
SUT PDS AB 2-0 CT1 27 (SUTURE) IMPLANT
SWAB CULTURE ESWAB REG 1ML (MISCELLANEOUS) ×2 IMPLANT
SYR BULB IRRIG 60ML STRL (SYRINGE) ×2 IMPLANT
SYR CONTROL 10ML LL (SYRINGE) IMPLANT
TOWEL GREEN STERILE (TOWEL DISPOSABLE) ×3 IMPLANT
TOWEL GREEN STERILE FF (TOWEL DISPOSABLE) ×3 IMPLANT
TUBE CONNECTING 12'X1/4 (SUCTIONS) ×1
TUBE CONNECTING 12X1/4 (SUCTIONS) ×2 IMPLANT
UNDERPAD 30X36 HEAVY ABSORB (UNDERPADS AND DIAPERS) ×3 IMPLANT
YANKAUER SUCT BULB TIP NO VENT (SUCTIONS) ×3 IMPLANT

## 2019-09-17 NOTE — Progress Notes (Signed)
Chaplain engaged in initial visit with Summer Cain.  She shared that she wasn't doing well because she is currently going through withdrawals.  Chaplain offered her support and offered a prayer over [name].    Chaplain will continue to follow-up.

## 2019-09-17 NOTE — Anesthesia Procedure Notes (Signed)
Procedure Name: LMA Insertion Date/Time: 09/17/2019 5:10 PM Performed by: Adria Dill, CRNA Pre-anesthesia Checklist: Patient identified, Emergency Drugs available, Suction available and Patient being monitored Patient Re-evaluated:Patient Re-evaluated prior to induction Oxygen Delivery Method: Circle system utilized Preoxygenation: Pre-oxygenation with 100% oxygen Induction Type: IV induction LMA: LMA inserted LMA Size: 4.0 Number of attempts: 1 Placement Confirmation: positive ETCO2 and breath sounds checked- equal and bilateral Tube secured with: Tape Dental Injury: Teeth and Oropharynx as per pre-operative assessment

## 2019-09-17 NOTE — Consult Note (Signed)
Reason for Consult:Right elbow abscess Referring Physician: E Nazirah Tri Cain is an 34 y.o. female.  HPI: Summer Cain was admitted with bacteremia 2/2 IVDU. She also had a right elbow abscess that's been there about a week. She's had multiple soft tissue infections in the past. She is RHD.  Past Medical History:  Diagnosis Date  . Pregnant   . Scoliosis     Past Surgical History:  Procedure Laterality Date  . KIDNEY STONE SURGERY      Family History  Problem Relation Age of Onset  . Hypertension Mother   . Depression Mother   . Hyperlipidemia Father   . Diabetes Paternal Grandmother     Social History:  reports that she has been smoking cigarettes. She has never used smokeless tobacco. She reports current alcohol use. She reports current drug use. Drug: IV.  Allergies:  Allergies  Allergen Reactions  . Latex Swelling    Medications: I have reviewed the patient's current medications.  Results for orders placed or performed during the hospital encounter of 09/15/19 (from the past 48 hour(s))  Basic metabolic panel     Status: Abnormal   Collection Time: 09/15/19 12:54 PM  Result Value Ref Range   Sodium 135 135 - 145 mmol/L   Potassium 3.3 (L) 3.5 - 5.1 mmol/L   Chloride 99 98 - 111 mmol/L   CO2 27 22 - 32 mmol/L   Glucose, Bld 88 70 - 99 mg/dL    Comment: Glucose reference range applies only to samples taken after fasting for at least 8 hours.   BUN 9 6 - 20 mg/dL   Creatinine, Ser 1.61 0.44 - 1.00 mg/dL   Calcium 9.0 8.9 - 09.6 mg/dL   GFR calc non Af Amer >60 >60 mL/min   GFR calc Af Amer >60 >60 mL/min   Anion gap 9 5 - 15    Comment: Performed at [facility], [address] [place], [place] [address]  CBC     Status: Abnormal   Collection Time: 09/15/19 12:54 PM  Result Value Ref Range   WBC 9.2 4.0 - 10.5 K/uL   RBC 4.34 3.87 - 5.11 MIL/uL   Hemoglobin 11.0 (L) 12.0 - 15.0 g/dL   HCT 98.1 36 - 46 %   MCV 83.2 80.0 - 100.0 fL   MCH 25.3 (L)  26.0 - 34.0 pg   MCHC 30.5 30.0 - 36.0 g/dL   RDW 19.1 47.8 - 29.5 %   Platelets 278 150 - 400 K/uL   nRBC 0.0 0.0 - 0.2 %    Comment: Performed at [facility], [address] [place], [place] [address]  Troponin I (High Sensitivity)     Status: None   Collection Time: 09/15/19 12:54 PM  Result Value Ref Range   Troponin I (High Sensitivity) 2 <18 ng/L    Comment: (NOTE) Elevated high sensitivity troponin I (hsTnI) values and significant  changes across serial measurements may suggest ACS but many other  chronic and acute conditions are known to elevate hsTnI results.  Refer to the "Links" section for chest pain algorithms and additional  guidance. Performed at [facility], [address] [place], [place] [address]   Lactic acid, plasma     Status: None   Collection Time: 09/15/19 12:54 PM  Result Value Ref Range   Lactic Acid, Venous 1.1 0.5 - 1.9 mmol/L    Comment: Performed at [facility], [address],  [place], [place] [address]  Pregnancy, urine     Status: None   Collection Time: 09/15/19 12:55 PM  Result Value Ref Range   Preg Test, Ur NEGATIVE NEGATIVE    Comment:        THE SENSITIVITY OF THIS METHODOLOGY IS >20 mIU/mL. Performed at [facility], [address] [place], [place] [address]   SARS Coronavirus 2 by RT PCR (hospital order, performed in [facility] hospital lab) Nasopharyngeal Nasopharyngeal Swab     Status: None   Collection Time: 09/15/19  1:20 PM   Specimen: Nasopharyngeal Swab  Result Value Ref Range   SARS Coronavirus 2 NEGATIVE NEGATIVE    Comment: (NOTE) SARS-CoV-2 target nucleic acids are NOT DETECTED.  The SARS-CoV-2 RNA is generally detectable in upper and lower respiratory specimens during the acute phase of infection. The lowest concentration of SARS-CoV-2 viral copies this assay can detect is 250 copies / mL. A negative result does not preclude SARS-CoV-2 infection and should not be used as the sole basis  for treatment or other patient management decisions.  A negative result may occur with improper specimen collection / handling, submission of specimen other than nasopharyngeal swab, presence of viral mutation(s) within the areas targeted by this assay, and inadequate number of viral copies (<250 copies / mL). A negative result must be combined with clinical observations, patient history, and epidemiological information.  Fact Sheet for Patients:   BoilerBrush.com.cy  Fact Sheet for Healthcare Providers: https://pope.com/  This test is not yet approved or  cleared by the [place] FDA and has been authorized for detection and/or diagnosis of SARS-CoV-2 by FDA under an Emergency Use Authorization (EUA).  This EUA will remain in effect (meaning this test can be used) for the duration of the COVID-19 declaration under Section 564(b)(1) of the Act, 21 U.S.C. section 360bbb-3(b)(1), unless the authorization is terminated or revoked sooner.  Performed at [facility], [address] [place], [place] [address]   Blood culture (routine x 2)     Status: None (Preliminary result)   Collection Time: 09/15/19  2:10 PM   Specimen: BLOOD  Result Value Ref Range   Specimen Description BLOOD LUE    Special Requests      BOTTLES DRAWN AEROBIC AND ANAEROBIC Blood Culture adequate volume   Culture      NO GROWTH 2 DAYS Performed at [facility], [address], [place], [place] [address]    Report Status PENDING   Lactic acid, plasma     Status: None   Collection Time: 09/15/19  7:06 PM  Result Value Ref Range   Lactic Acid, Venous 1.1 0.5 - 1.9 mmol/L    Comment: Performed at [facility], [address] [place], [place] [address]  Troponin I (High Sensitivity)     Status: None   Collection Time: 09/15/19  7:06 PM  Result Value Ref Range   Troponin I (High Sensitivity) 4 <18 ng/L    Comment: (NOTE) Elevated high  sensitivity troponin I (hsTnI) values and significant  changes across serial measurements may suggest ACS but many other  chronic and acute conditions are known to elevate hsTnI results.  Refer to the "Links" section for chest pain algorithms and additional  guidance. Performed at [facility], [address], [place], [place] [address]   Blood culture (routine x 2)     Status: None (Preliminary result)   Collection Time: 09/15/19  7:14 PM  Specimen: BLOOD LEFT ARM  Result Value Ref Range   Specimen Description BLOOD LEFT ARM    Special Requests      BOTTLES DRAWN AEROBIC AND ANAEROBIC Blood Culture results may not be optimal due to an inadequate volume of blood received in culture bottles   Culture      NO GROWTH 2 DAYS Performed at [facility], [address] [place], [place] [address]    Report Status PENDING   Comprehensive metabolic panel     Status: Abnormal   Collection Time: 09/16/19  6:44 PM  Result Value Ref Range   Sodium 141 135 - 145 mmol/L   Potassium 4.0 3.5 - 5.1 mmol/L    Comment: NO VISIBLE HEMOLYSIS   Chloride 109 98 - 111 mmol/L   CO2 23 22 - 32 mmol/L   Glucose, Bld 147 (H) 70 - 99 mg/dL    Comment: Glucose reference range applies only to samples taken after fasting for at least 8 hours.   BUN <5 (L) 6 - 20 mg/dL   Creatinine, Ser 0.97 0.44 - 1.00 mg/dL   Calcium 8.9 8.9 - 35.3 mg/dL   Total Protein 6.6 6.5 - 8.1 g/dL   Albumin 2.7 (L) 3.5 - 5.0 g/dL   AST 27 15 - 41 U/L   ALT 33 0 - 44 U/L   Alkaline Phosphatase 73 38 - 126 U/L   Total Bilirubin 0.4 0.3 - 1.2 mg/dL   GFR calc non Af Amer >60 >60 mL/min   GFR calc Af Amer >60 >60 mL/min   Anion gap 9 5 - 15    Comment: Performed at [facility], [address], [place], [place] [address]  CBC     Status: Abnormal   Collection Time: 09/16/19  8:30 PM  Result Value Ref Range   WBC 7.5 4.0 - 10.5 K/uL   RBC 5.10 3.87 - 5.11 MIL/uL   Hemoglobin 13.0 12.0 - 15.0 g/dL   HCT 26.8 36  - 46 %   MCV 82.2 80.0 - 100.0 fL   MCH 25.5 (L) 26.0 - 34.0 pg   MCHC 31.0 30.0 - 36.0 g/dL   RDW 34.1 96.2 - 22.9 %   Platelets 347 150 - 400 K/uL   nRBC 0.0 0.0 - 0.2 %    Comment: Performed at [facility], [address], [place], [place] [address]    CT ABDOMEN PELVIS W CONTRAST  Result Date: 09/15/2019 CLINICAL DATA:  Right lower quadrant abdominal pain. Patient reports chest pain. Patient reports she needs treatment for "all my infections including my heart infections and mass in my groin ". Technologist notes state numerous skin abscesses. History of IV drug use. EXAM: CT ABDOMEN AND PELVIS WITH CONTRAST TECHNIQUE: Multidetector CT imaging of the abdomen and pelvis was performed using the standard protocol following bolus administration of intravenous contrast. CONTRAST:  OMNIPAQUE IOHEXOL 300 MG/ML  SOLN COMPARISON:  Right upper quadrant ultrasound 04/02/2019. FINDINGS: Lower chest: No pleural fluid. No confluent or nodular opacity in the lung bases. No obvious vegetation in the included heart. Hepatobiliary: Mild heterogeneity of hepatic parenchyma but no discrete focal lesion. Contracted gallbladder. No calcified gallstone. No biliary dilatation. Pancreas: No ductal dilatation or inflammation. Spleen: Enlarged spanning 14.4 cm cranial caudal. No focal abnormality. Adrenals/Urinary Tract: No adrenal nodule. No hydronephrosis or perinephric edema. Homogeneous renal enhancement. Small cyst in the lower right kidney is well as cortical hypodensity too small to accurately characterize. Urinary  bladder is physiologically distended without wall thickening. Stomach/Bowel: Bowel assessment is limited in the absence of enteric contrast and paucity of intra-abdominal fat. Ingested material distends the stomach. There is no gastric wall thickening. No small bowel obstruction. Equivocal wall thickening of the terminal ileum versus nondistention, series 3, image 63. The appendix is identified  with moderate certainty, series 6, image 51, without evidence of appendicitis. Moderate volume of colonic stool. No abnormal rectal distention. Vascular/Lymphatic: Prominence of the gonadal veins. Normal caliber abdominal aorta. Patent portal vein. No bulky abdominopelvic adenopathy. No evidence of inguinal adenopathy. Reproductive: Retroverted uterus. There is prominent periuterine and adnexal vascularity. Dilatation of the ovarian veins measuring 6 mm on the left and 4 mm on the right. No adnexal mass. Other: Small amount of free fluid in the pelvis, may be reactive. No free air. No intra-abdominal or pelvic fluid collection. Small amount of air in the right anterior abdominal wall, series 3, image 59, nonspecific and may be related to subcutaneous injection. There is no evidence of subcutaneous fluid collection. 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 Mild scoliotic curvature of the lumbar spine. There is no bony destruction. No visualized intramuscular collection. IMPRESSION: 1. Equivocal wall thickening of the terminal ileum versus nondistention. In the setting of right lower quadrant pain this could represent mild enteritis, either infectious or inflammatory including Crohn's disease. 2. No evidence of appendicitis. 3. Prominent periuterine and adnexal vascularity with dilatation of the ovarian veins, can be seen with pelvic congestion syndrome. 4. Splenomegaly. 5. 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 6. Small amount of air in the right anterior abdominal wall is nonspecific and may be related to subcutaneous injection. No subcutaneous fluid collection. Electronically Signed   By: Narda RutherfordMelanie  Sanford M.D.   On: 09/15/2019 18:02    Review of Systems  Constitutional: Negative for chills, diaphoresis and fever.  HENT: Negative for ear discharge, ear pain, hearing loss and tinnitus.   Eyes: Negative for photophobia and pain.   Respiratory: Negative for cough and shortness of breath.   Cardiovascular: Positive for chest pain.  Gastrointestinal: Negative for abdominal pain, nausea and vomiting.  Genitourinary: Negative for dysuria, flank pain, frequency and urgency.  Musculoskeletal: Positive for arthralgias (Right elbow) and back pain. Negative for myalgias and neck pain.  Neurological: Negative for dizziness and headaches.  Hematological: Does not bruise/bleed easily.  Psychiatric/Behavioral: The patient is not nervous/anxious.    Blood pressure 107/60, pulse 70, temperature 98.4 F (36.9 C), temperature source Oral, resp. rate 17, height 5' 0.25" (1.53 m), weight 49.9 kg, SpO2 98 %, not currently breastfeeding. Physical Exam Constitutional:      General: She is not in acute distress.    Appearance: She is well-developed. She is not diaphoretic.  HENT:     Head: Normocephalic and atraumatic.  Eyes:     General: No scleral icterus.       Right eye: No discharge.        Left eye: No discharge.     Conjunctiva/sclera: Conjunctivae normal.  Cardiovascular:     Rate and Rhythm: Normal rate and regular rhythm.  Pulmonary:     Effort: Pulmonary effort is normal. No respiratory distress.  Musculoskeletal:     Cervical back: Normal range of motion.     Comments: Right shoulder, elbow, wrist, digits- no skin wounds, mod TTP medial elbow associated with erythematous, fluctuant area, no instability, no blocks to motion  Sens  Ax/R/M/U intact  Mot   Ax/ R/ PIN/ M/  AIN/ U intact  Rad 2+  Skin:    General: Skin is warm and dry.  Neurological:     Mental Status: She is alert.  Psychiatric:        Behavior: Behavior normal.     Assessment/Plan: Right elbow abscess -- Suspect she will do better with operative I&D. Plan today at 4 with Dr. Eulah Pont. Multiple medical problems including IVDU, scoliosis, bacteremia, mitral valve endocarditis, and hepatitis C -- per primary service    Freeman Caldron,  PA-C Orthopedic Surgery [phone] 09/17/2019, 11:18 AM

## 2019-09-17 NOTE — Interval H&P Note (Signed)
History and Physical Interval Note:  09/17/2019 4:46 PM  Summer Cain  has presented today for surgery, with the diagnosis of Right elbow abscess.  The various methods of treatment have been discussed with the patient and family. After consideration of risks, benefits and other options for treatment, the patient has consented to  Procedure(s): IRRIGATION AND DEBRIDEMENT ELBOW (Right) as a surgical intervention.  The patient's history has been reviewed, patient examined, no change in status, stable for surgery.  I have reviewed the patient's chart and labs.  Questions were answered to the patient's satisfaction.     Sheral Apley

## 2019-09-17 NOTE — Anesthesia Preprocedure Evaluation (Addendum)
Anesthesia Evaluation  Patient identified by MRN, date of birth, ID bandGeneral Assessment Comment:Sleepy but arousable and answers questions, very irritable  History of Anesthesia Complications Negative for: history of anesthetic complications  Airway Mallampati: II  TM Distance: >3 FB Neck ROM: Full    Dental  (+) Poor Dentition, Dental Advisory Given   Pulmonary Current Smoker and Patient abstained from smoking.,  09/15/2019 SARS coronavirus NEG   breath sounds clear to auscultation       Cardiovascular  Rhythm:Regular Rate:Normal  07/2019 ECHO: EF 60-65%, no sig valvular abnormalities   Neuro/Psych Anxiety Depression Bipolar Disorder negative neurological ROS     GI/Hepatic negative GI ROS, (+)     substance abuse  alcohol use, cocaine use and IV drug use, Hepatitis -  Endo/Other  negative endocrine ROS  Renal/GU negative Renal ROS     Musculoskeletal  (+) narcotic dependent  Abdominal   Peds  Hematology   Anesthesia Other Findings   Reproductive/Obstetrics                            Anesthesia Physical Anesthesia Plan  ASA: III  Anesthesia Plan: General   Post-op Pain Management:    Induction: Intravenous  PONV Risk Score and Plan: 2 and Ondansetron and Dexamethasone  Airway Management Planned: LMA  Additional Equipment:   Intra-op Plan:   Post-operative Plan:   Informed Consent: I have reviewed the patients History and Physical, chart, labs and discussed the procedure including the risks, benefits and alternatives for the proposed anesthesia with the patient or authorized representative who has indicated his/her understanding and acceptance.     Dental advisory given  Plan Discussed with: CRNA and Surgeon  Anesthesia Plan Comments:        Anesthesia Quick Evaluation

## 2019-09-17 NOTE — Anesthesia Postprocedure Evaluation (Signed)
Anesthesia Post Note  Patient: Geophysical data processor  Procedure(s) Performed: IRRIGATION AND DEBRIDEMENT RIGHT  ELBOW AND RIGHT WRIST (Right Elbow)     Patient location during evaluation: PACU Anesthesia Type: General Level of consciousness: awake and alert, patient cooperative and oriented Pain management: pain level controlled (on pain meds, improving) Vital Signs Assessment: post-procedure vital signs reviewed and stable Respiratory status: spontaneous breathing, nonlabored ventilation and respiratory function stable Cardiovascular status: blood pressure returned to baseline and stable Postop Assessment: no apparent nausea or vomiting Anesthetic complications: no   No complications documented.  Last Vitals:  Vitals:   09/17/19 1809 09/17/19 1810  BP: 102/67 102/67  Pulse: 71 60  Resp: (!) 33 16  Temp:    SpO2: 100% 98%    Last Pain:  Vitals:   09/17/19 1810  TempSrc:   PainSc: 10-Worst pain ever                 Jamilett Ferrante,E. Andreka Stucki

## 2019-09-17 NOTE — Plan of Care (Signed)
  Problem: Education: Goal: Knowledge of General Education information will improve Description Including pain rating scale, medication(s)/side effects and non-pharmacologic comfort measures Outcome: Progressing   

## 2019-09-17 NOTE — Transfer of Care (Signed)
Immediate Anesthesia Transfer of Care Note  Patient: Summer Cain  Procedure(s) Performed: IRRIGATION AND DEBRIDEMENT RIGHT  ELBOW AND RIGHT WRIST (Right Elbow)  Patient Location: PACU  Anesthesia Type:General  Level of Consciousness: awake and patient cooperative  Airway & Oxygen Therapy: Patient Spontanous Breathing  Post-op Assessment: Report given to RN and Post -op Vital signs reviewed and stable  Post vital signs: Reviewed and stable  Last Vitals:  Vitals Value Taken Time  BP 152/80 09/17/19 1755  Temp 37 C 09/17/19 1754  Pulse 77 09/17/19 1756  Resp 19 09/17/19 1756  SpO2 100 % 09/17/19 1756  Vitals shown include unvalidated device data.  Last Pain:  Vitals:   09/17/19 1405  TempSrc: Oral  PainSc:       Patients Stated Pain Goal: 0 (09/16/19 2100)  Complications: No complications documented.

## 2019-09-17 NOTE — Hospital Course (Addendum)
Summer Cain is a 34 year old woman who injects drugs with history of scoliosis, bacteremia, mitral valve endocarditis, and hepatitis C who presented to the [facility] ED with subjective fevers and chest pain admitted to our service for high risk of bacteremia.   Risk of bacteremia Patient received incomplete treatment of polymicrobial bacteremia (Serratia, Pseudomonas and MRSA) at the end of July. Remained afebrile here without leukocytosis. Continued empiric antibiotics while awaiting results of blood cultures obtained on admission. She is at risk for endocarditis due to IVDU and given presence of holosystolic murmur. TTE on 8/31 revealed no mitral valve vegetation. Given no apparent bacteremia and cultures showing no growth, cefepime was stopped in the morning of day 4 and vancomycin was stopped after its final administration later that day.   Skin and soft tissue infection Most prominent infection is medial to the R AC fossa. After the first day of hospitalization, this abscess continued to expand so general surgery performed an I&D on 8/30. Patient will follow up in surgery clinic with Dr. Margarita Rana 1-2 weeks after discharge. Vancomycin was replaced with doxycycline on day 5 to complete a 7-day course of antibiotics while providing MRSA coverage given her MRSA positivity. Wound culture grew Gram negative rods on 9/1 and ciprofloxacin was added for adequate coverage. The cultures grew pseudomonas and serratia on 9/2, so cipro continuation was felt to be appropriate.  Opioid use disorder Last use ~2 am on 8/28. Withdrawal symptoms began late 8/29 but were worsened by PO use of suboxone rather than sublingual. On 8/30, we emphasized the importance of sublingual administration and added clonidine 0.1 mg BID, lorazepam 0.5 mg q4hrs IV PRN, Ketorolac 30 mg q6hrs IV PRN. On 9/1, since the suboxone was still causing her withdrawal to worsen, we switched to methadone 20 mg oral daily with good effect.  She is selecting a methadone clinic and will receive methadone prescriptions there.   Mood disorder Reports taking Seroquel and trazodone in the past prior to her most recent relapse. She plans to return to [facility] for psychiatric and substance abuse services after this admission. Seroquel was started at 25 mg QHS and then increased to 50 mg QHS on 8/31. She was also given trazodone 100 mg QHS throughout her stay.   Lower abdominal pain She presented with severe RUQ and RLQ pain. CT abdomen pelvis was suggestive of pelvic congestion syndrome but otherwise unremarkable. Outpatient f/u recommended to patient.   Broken and infected incisor Given history of draining abscess in this area and continued purulence, it is likely that this tooth is infected. Will monitor for improvement on antibiotics, but it will likely require eventual dental intervention. Social work was consulted for IPV assessment and provided guidance to the patient.  Chest pain One of her presenting symptoms was left-sided chest pain. CXR on admission showed interstitial opacities and CXR on 9/1 showed increased opacities, but the numerous antibiotics she has been on should provide adequate coverage if these opacities represent a pneumonia of some kind.

## 2019-09-17 NOTE — Discharge Instructions (Signed)
Diet: As you were doing prior to hospitalization   Shower:  May shower but keep the wounds dry, use an occlusive plastic wrap, NO SOAKING IN TUB.  If the bandage gets wet, change with a clean dry gauze.  If you have a splint on, leave the splint in place and keep the splint dry with a plastic bag.  Dressing:  Keep dressings on and dry until follow up.  Activity:  Increase activity slowly as tolerated, but follow the weight bearing instructions below.  The rules on driving is that you can not be taking narcotics while you drive, and you must feel in control of the vehicle.    Weight Bearing:  Partial weight bearing only. [place] ROM only.    To prevent constipation: you may use a stool softener such as -  Colace (over the counter) 100 mg by mouth twice a day  Drink plenty of fluids (prune juice may be helpful) and high fiber foods Miralax (over the counter) for constipation as needed.    Itching:  If you experience itching with your medications, try taking only a single pain pill, or even half a pain pill at a time.  You can also use benadryl over the counter for itching or also to help with sleep.   Precautions:  If you experience chest pain or shortness of breath - call 911 immediately for transfer to the hospital emergency department!!  If you develop a fever greater that 101 F, purulent drainage from wound, increased redness or drainage from wound, or calf pain -- Call the office at [phone]                                                 Follow- Up Appointment:  Please call for an appointment to be seen in 1 week [place] - [phone]

## 2019-09-17 NOTE — Progress Notes (Signed)
Subjective: Isa had a significant amount of withdrawal symptoms overnight that she felt were worsened by each administration of suboxone. She was told to swallow the suboxone rather than allowing it to dissolve sublingually. She was then given 1mg  of dilaudid and continued to experience withdrawal. She still exhibits sensitivity to bright lights this morning.  The only pain she reports is from the soft tissue infection of her right antecubital fossa. She feels that the infection there is worse than yesterday but that her other soft tissue infections are stable and less painful.  Objective: Vital signs in last 24 hours: Vitals:   09/16/19 1251 09/16/19 2006 09/17/19 0006 09/17/19 0502  BP: 99/71 105/63 113/60 107/60  Pulse: 70 78 80 70  Resp: 15 17 20 17   Temp: 97.9 F (36.6 C) 98.3 F (36.8 C) 98.4 F (36.9 C) 98.4 F (36.9 C)  TempSrc: Oral Oral Oral Oral  SpO2: 100% 100% 99% 98%  Weight:      Height:       Weight change:   Intake/Output Summary (Last 24 hours) at 09/17/2019 0905 Last data filed at 09/16/2019 1901 Gross per 24 hour  Intake 810 ml  Output --  Net 810 ml   Physical Exam Constitutional:      General: She is in distress.    Appearance: She is well-developed. She is not toxic-appearing or diaphoretic.  HENT:     Head: Normocephalic.     Mouth: Missing upper left lateral incisor--in its place is a brown to black, rotting stump protruding 2-3 mm from the gingiva surrounding a hole that drains purulent discharge Cardiovascular:     Rate and Rhythm: Normal rate and regular rhythm.     Pulses:          Carotid pulses are 2+ on the right side and 2+ on the left side.      Radial pulses are 2+ on the right side and 2+ on the left side.       Dorsalis pedis pulses are 2+ on the right side and 2+ on the left side.       Posterior tibial pulses are 2+ on the right side and 2+ on the left side.     Heart sounds: 3/6 holosystolic murmur best heard at the left upper  sternal border. No friction rub. No gallop. No S3 or S4 sounds.   Pulmonary:     Effort: No tachypnea, accessory muscle usage or respiratory distress.     Breath sounds: No stridor. Decreased breath sounds present, possibly secondary to poor air movement. No wheezing, rhonchi or rales.  Chest:     Chest wall: No mass, deformity, crepitus or edema.  Skin:    General: Skin is warm and dry.     Coloration: Skin is not cyanotic.     Findings: No ecchymosis or erythema. Inflamed, red, subcutaneous nodules present throughout both UEs. Prominent red nodule in R antecubital fossa measuring 4-5 cm in diameter, photo in media tab. Postinflammatory hyperpigmentation from large former abscess on right dorsal foot.    Nails: There is no clubbing.  Neurological:     General: No focal deficit present. Photosensitive.    Mental Status: She is alert and oriented to person, place, and time.     Cranial Nerves: No cranial nerve deficit.  Psychiatric:        Mood and Affect: Mood normal.        Behavior: Behavior normal.    Assessment/Plan:  Principal Problem:  Soft tissue infection Active Problems:   Bipolar affective disorder, currently depressed, moderate (HCC)   Pleuritic chest pain   Opioid use disorder (HCC)  Summer Cain is a 34 year oldwoman who injects drugs with history of scoliosis, bacteremia, mitral valve endocarditis, and hepatitis C who presented to the [facility] ED with subjective fevers and chest pain admitted to our service for high risk of bacteremia.   Risk of bacteremia Patient received incomplete treatment of polymicrobial bacteremia (Serratia, Pseudomonas and MRSA) at the end of July. Has remained afebrile here without leukocytosis. Will continue with empiric antibiotics while awaiting results of blood cultures obtained on admission. Risk for endocarditis. Patient has holosystolic murmur. Will consider TEE during this admission for definitive diagnosis of possible vegetation  depending on culture results. - 8/28 blood cultures pending  - IV cefepime and vancomycin day 3 (Day 1: 8/28)  Skin and soft tissue infection Present medial to the R AC fossa. Looks worse today, it is getting larger and more painful. Will continue with IV vancomycin today and given no improvement placed consult to general surgery to evaluate for possible I&D. - IV cefepime and vancomycin day 3 (Day 1: 8/28) - General surgery consult for I&D  Opioid use disorder Last use ~2 am on 8/28. Withdrawal symptoms began last night but were worsened by PO use of suboxone rather than sublingual. - Suboxone 8 mg BID, emphasized importance of sublingual administration - Clonidine 0.1 mg BID - Lorazepam 0.5 mg q4hrs IV PRN  - Ketorolac 30 mg q6hrs IV PRN   Mood disorder Patient reports she plans to return to [facility] for psychiatric and substance abuse services after this admission. Reports taking Seroquel and trazodone in the past prior to her most recent relapse. 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 Will monitor for improvement on antibiotics, but it will likely require eventual dental intervention. - Social work consult for IPV assessment   Prior to Admission Living Arrangement: home Anticipated Discharge Location: home Barriers to Discharge: completion of IV antibiotic treatment for possible bacteremia/endocarditis    LOS: 2 days   Pasty Arch, Medical Student 09/17/2019, 9:05 AM  Pager: [phone]

## 2019-09-17 NOTE — H&P (View-Only) (Signed)
Reason for Consult:Right elbow abscess Referring Physician: E Nazirah Tri [name] is an 34 y.o. female.  HPI: Angelli was admitted with bacteremia 2/2 IVDU. She also had a right elbow abscess that's been there about a week. She's had multiple soft tissue infections in the past. She is RHD.  Past Medical History:  Diagnosis Date  . Pregnant   . Scoliosis     Past Surgical History:  Procedure Laterality Date  . KIDNEY STONE SURGERY      Family History  Problem Relation Age of Onset  . Hypertension Mother   . Depression Mother   . Hyperlipidemia Father   . Diabetes Paternal Grandmother     Social History:  reports that she has been smoking cigarettes. She has never used smokeless tobacco. She reports current alcohol use. She reports current drug use. Drug: IV.  Allergies:  Allergies  Allergen Reactions  . Latex Swelling    Medications: I have reviewed the patient's current medications.  Results for orders placed or performed during the hospital encounter of 09/15/19 (from the past 48 hour(s))  Basic metabolic panel     Status: Abnormal   Collection Time: 09/15/19 12:54 PM  Result Value Ref Range   Sodium 135 135 - 145 mmol/L   Potassium 3.3 (L) 3.5 - 5.1 mmol/L   Chloride 99 98 - 111 mmol/L   CO2 27 22 - 32 mmol/L   Glucose, Bld 88 70 - 99 mg/dL    Comment: Glucose reference range applies only to samples taken after fasting for at least 8 hours.   BUN 9 6 - 20 mg/dL   Creatinine, Ser 1.61 0.44 - 1.00 mg/dL   Calcium 9.0 8.9 - 09.6 mg/dL   GFR calc non Af Amer >60 >60 mL/min   GFR calc Af Amer >60 >60 mL/min   Anion gap 9 5 - 15    Comment: Performed at [facility], [address] [place], [place] [address]  CBC     Status: Abnormal   Collection Time: 09/15/19 12:54 PM  Result Value Ref Range   WBC 9.2 4.0 - 10.5 K/uL   RBC 4.34 3.87 - 5.11 MIL/uL   Hemoglobin 11.0 (L) 12.0 - 15.0 g/dL   HCT 98.1 36 - 46 %   MCV 83.2 80.0 - 100.0 fL   MCH 25.3 (L)  26.0 - 34.0 pg   MCHC 30.5 30.0 - 36.0 g/dL   RDW 19.1 47.8 - 29.5 %   Platelets 278 150 - 400 K/uL   nRBC 0.0 0.0 - 0.2 %    Comment: Performed at [facility], [address] [place], [place] [address]  Troponin I (High Sensitivity)     Status: None   Collection Time: 09/15/19 12:54 PM  Result Value Ref Range   Troponin I (High Sensitivity) 2 <18 ng/L    Comment: (NOTE) Elevated high sensitivity troponin I (hsTnI) values and significant  changes across serial measurements may suggest ACS but many other  chronic and acute conditions are known to elevate hsTnI results.  Refer to the "Links" section for chest pain algorithms and additional  guidance. Performed at [facility], [address] [place], [place] [address]   Lactic acid, plasma     Status: None   Collection Time: 09/15/19 12:54 PM  Result Value Ref Range   Lactic Acid, Venous 1.1 0.5 - 1.9 mmol/L    Comment: Performed at [facility], [address],  [place], [place] [address]  Pregnancy, urine     Status: None   Collection Time: 09/15/19 12:55 PM  Result Value Ref Range   Preg Test, Ur NEGATIVE NEGATIVE    Comment:        THE SENSITIVITY OF THIS METHODOLOGY IS >20 mIU/mL. Performed at [facility], [address] [place], [place] [address]   SARS Coronavirus 2 by RT PCR (hospital order, performed in [facility] hospital lab) Nasopharyngeal Nasopharyngeal Swab     Status: None   Collection Time: 09/15/19  1:20 PM   Specimen: Nasopharyngeal Swab  Result Value Ref Range   SARS Coronavirus 2 NEGATIVE NEGATIVE    Comment: (NOTE) SARS-CoV-2 target nucleic acids are NOT DETECTED.  The SARS-CoV-2 RNA is generally detectable in upper and lower respiratory specimens during the acute phase of infection. The lowest concentration of SARS-CoV-2 viral copies this assay can detect is 250 copies / mL. A negative result does not preclude SARS-CoV-2 infection and should not be used as the sole basis  for treatment or other patient management decisions.  A negative result may occur with improper specimen collection / handling, submission of specimen other than nasopharyngeal swab, presence of viral mutation(s) within the areas targeted by this assay, and inadequate number of viral copies (<250 copies / mL). A negative result must be combined with clinical observations, patient history, and epidemiological information.  Fact Sheet for Patients:   BoilerBrush.com.cy  Fact Sheet for Healthcare Providers: https://pope.com/  This test is not yet approved or  cleared by the [place] FDA and has been authorized for detection and/or diagnosis of SARS-CoV-2 by FDA under an Emergency Use Authorization (EUA).  This EUA will remain in effect (meaning this test can be used) for the duration of the COVID-19 declaration under Section 564(b)(1) of the Act, 21 U.S.C. section 360bbb-3(b)(1), unless the authorization is terminated or revoked sooner.  Performed at [facility], [address] [place], [place] [address]   Blood culture (routine x 2)     Status: None (Preliminary result)   Collection Time: 09/15/19  2:10 PM   Specimen: BLOOD  Result Value Ref Range   Specimen Description BLOOD LUE    Special Requests      BOTTLES DRAWN AEROBIC AND ANAEROBIC Blood Culture adequate volume   Culture      NO GROWTH 2 DAYS Performed at [facility], [address], [place], [place] [address]    Report Status PENDING   Lactic acid, plasma     Status: None   Collection Time: 09/15/19  7:06 PM  Result Value Ref Range   Lactic Acid, Venous 1.1 0.5 - 1.9 mmol/L    Comment: Performed at [facility], [address] [place], [place] [address]  Troponin I (High Sensitivity)     Status: None   Collection Time: 09/15/19  7:06 PM  Result Value Ref Range   Troponin I (High Sensitivity) 4 <18 ng/L    Comment: (NOTE) Elevated high  sensitivity troponin I (hsTnI) values and significant  changes across serial measurements may suggest ACS but many other  chronic and acute conditions are known to elevate hsTnI results.  Refer to the "Links" section for chest pain algorithms and additional  guidance. Performed at [facility], [address], [place], [place] [address]   Blood culture (routine x 2)     Status: None (Preliminary result)   Collection Time: 09/15/19  7:14 PM  Specimen: BLOOD LEFT ARM  Result Value Ref Range   Specimen Description BLOOD LEFT ARM    Special Requests      BOTTLES DRAWN AEROBIC AND ANAEROBIC Blood Culture results may not be optimal due to an inadequate volume of blood received in culture bottles   Culture      NO GROWTH 2 DAYS Performed at [facility], [address] [place], [place] [address]    Report Status PENDING   Comprehensive metabolic panel     Status: Abnormal   Collection Time: 09/16/19  6:44 PM  Result Value Ref Range   Sodium 141 135 - 145 mmol/L   Potassium 4.0 3.5 - 5.1 mmol/L    Comment: NO VISIBLE HEMOLYSIS   Chloride 109 98 - 111 mmol/L   CO2 23 22 - 32 mmol/L   Glucose, Bld 147 (H) 70 - 99 mg/dL    Comment: Glucose reference range applies only to samples taken after fasting for at least 8 hours.   BUN <5 (L) 6 - 20 mg/dL   Creatinine, Ser 0.97 0.44 - 1.00 mg/dL   Calcium 8.9 8.9 - 35.3 mg/dL   Total Protein 6.6 6.5 - 8.1 g/dL   Albumin 2.7 (L) 3.5 - 5.0 g/dL   AST 27 15 - 41 U/L   ALT 33 0 - 44 U/L   Alkaline Phosphatase 73 38 - 126 U/L   Total Bilirubin 0.4 0.3 - 1.2 mg/dL   GFR calc non Af Amer >60 >60 mL/min   GFR calc Af Amer >60 >60 mL/min   Anion gap 9 5 - 15    Comment: Performed at [facility], [address], [place], [place] [address]  CBC     Status: Abnormal   Collection Time: 09/16/19  8:30 PM  Result Value Ref Range   WBC 7.5 4.0 - 10.5 K/uL   RBC 5.10 3.87 - 5.11 MIL/uL   Hemoglobin 13.0 12.0 - 15.0 g/dL   HCT 26.8 36  - 46 %   MCV 82.2 80.0 - 100.0 fL   MCH 25.5 (L) 26.0 - 34.0 pg   MCHC 31.0 30.0 - 36.0 g/dL   RDW 34.1 96.2 - 22.9 %   Platelets 347 150 - 400 K/uL   nRBC 0.0 0.0 - 0.2 %    Comment: Performed at [facility], [address], [place], [place] [address]    CT ABDOMEN PELVIS W CONTRAST  Result Date: 09/15/2019 CLINICAL DATA:  Right lower quadrant abdominal pain. Patient reports chest pain. Patient reports she needs treatment for "all my infections including my heart infections and mass in my groin ". Technologist notes state numerous skin abscesses. History of IV drug use. EXAM: CT ABDOMEN AND PELVIS WITH CONTRAST TECHNIQUE: Multidetector CT imaging of the abdomen and pelvis was performed using the standard protocol following bolus administration of intravenous contrast. CONTRAST:  OMNIPAQUE IOHEXOL 300 MG/ML  SOLN COMPARISON:  Right upper quadrant ultrasound 04/02/2019. FINDINGS: Lower chest: No pleural fluid. No confluent or nodular opacity in the lung bases. No obvious vegetation in the included heart. Hepatobiliary: Mild heterogeneity of hepatic parenchyma but no discrete focal lesion. Contracted gallbladder. No calcified gallstone. No biliary dilatation. Pancreas: No ductal dilatation or inflammation. Spleen: Enlarged spanning 14.4 cm cranial caudal. No focal abnormality. Adrenals/Urinary Tract: No adrenal nodule. No hydronephrosis or perinephric edema. Homogeneous renal enhancement. Small cyst in the lower right kidney is well as cortical hypodensity too small to accurately characterize. Urinary  bladder is physiologically distended without wall thickening. Stomach/Bowel: Bowel assessment is limited in the absence of enteric contrast and paucity of intra-abdominal fat. Ingested material distends the stomach. There is no gastric wall thickening. No small bowel obstruction. Equivocal wall thickening of the terminal ileum versus nondistention, series 3, image 63. The appendix is identified  with moderate certainty, series 6, image 51, without evidence of appendicitis. Moderate volume of colonic stool. No abnormal rectal distention. Vascular/Lymphatic: Prominence of the gonadal veins. Normal caliber abdominal aorta. Patent portal vein. No bulky abdominopelvic adenopathy. No evidence of inguinal adenopathy. Reproductive: Retroverted uterus. There is prominent periuterine and adnexal vascularity. Dilatation of the ovarian veins measuring 6 mm on the left and 4 mm on the right. No adnexal mass. Other: Small amount of free fluid in the pelvis, may be reactive. No free air. No intra-abdominal or pelvic fluid collection. Small amount of air in the right anterior abdominal wall, series 3, image 59, nonspecific and may be related to subcutaneous injection. There is no evidence of subcutaneous fluid collection. 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 Mild scoliotic curvature of the lumbar spine. There is no bony destruction. No visualized intramuscular collection. IMPRESSION: 1. Equivocal wall thickening of the terminal ileum versus nondistention. In the setting of right lower quadrant pain this could represent mild enteritis, either infectious or inflammatory including Crohn's disease. 2. No evidence of appendicitis. 3. Prominent periuterine and adnexal vascularity with dilatation of the ovarian veins, can be seen with pelvic congestion syndrome. 4. Splenomegaly. 5. 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 6. Small amount of air in the right anterior abdominal wall is nonspecific and may be related to subcutaneous injection. No subcutaneous fluid collection. Electronically Signed   By: Narda RutherfordMelanie  Sanford M.D.   On: 09/15/2019 18:02    Review of Systems  Constitutional: Negative for chills, diaphoresis and fever.  HENT: Negative for ear discharge, ear pain, hearing loss and tinnitus.   Eyes: Negative for photophobia and pain.   Respiratory: Negative for cough and shortness of breath.   Cardiovascular: Positive for chest pain.  Gastrointestinal: Negative for abdominal pain, nausea and vomiting.  Genitourinary: Negative for dysuria, flank pain, frequency and urgency.  Musculoskeletal: Positive for arthralgias (Right elbow) and back pain. Negative for myalgias and neck pain.  Neurological: Negative for dizziness and headaches.  Hematological: Does not bruise/bleed easily.  Psychiatric/Behavioral: The patient is not nervous/anxious.    Blood pressure 107/60, pulse 70, temperature 98.4 F (36.9 C), temperature source Oral, resp. rate 17, height 5' 0.25" (1.53 m), weight 49.9 kg, SpO2 98 %, not currently breastfeeding. Physical Exam Constitutional:      General: She is not in acute distress.    Appearance: She is well-developed. She is not diaphoretic.  HENT:     Head: Normocephalic and atraumatic.  Eyes:     General: No scleral icterus.       Right eye: No discharge.        Left eye: No discharge.     Conjunctiva/sclera: Conjunctivae normal.  Cardiovascular:     Rate and Rhythm: Normal rate and regular rhythm.  Pulmonary:     Effort: Pulmonary effort is normal. No respiratory distress.  Musculoskeletal:     Cervical back: Normal range of motion.     Comments: Right shoulder, elbow, wrist, digits- no skin wounds, mod TTP medial elbow associated with erythematous, fluctuant area, no instability, no blocks to motion  Sens  Ax/R/M/U intact  Mot   Ax/ R/ PIN/ M/  AIN/ U intact  Rad 2+  Skin:    General: Skin is warm and dry.  Neurological:     Mental Status: She is alert.  Psychiatric:        Behavior: Behavior normal.     Assessment/Plan: Right elbow abscess -- Suspect she will do better with operative I&D. Plan today at 4 with Dr. Murphy. Multiple medical problems including IVDU, scoliosis, bacteremia, mitral valve endocarditis, and hepatitis C -- per primary service    Cordarius Benning J. Koston Hennes,  PA-C Orthopedic Surgery [phone] 09/17/2019, 11:18 AM  

## 2019-09-18 ENCOUNTER — Inpatient Hospital Stay (HOSPITAL_COMMUNITY): Payer: Medicaid Other

## 2019-09-18 ENCOUNTER — Encounter (HOSPITAL_COMMUNITY): Payer: Self-pay | Admitting: Orthopedic Surgery

## 2019-09-18 DIAGNOSIS — I361 Nonrheumatic tricuspid (valve) insufficiency: Secondary | ICD-10-CM

## 2019-09-18 DIAGNOSIS — I340 Nonrheumatic mitral (valve) insufficiency: Secondary | ICD-10-CM

## 2019-09-18 LAB — CBC
HCT: 35.4 % — ABNORMAL LOW (ref 36.0–46.0)
Hemoglobin: 11 g/dL — ABNORMAL LOW (ref 12.0–15.0)
MCH: 25.8 pg — ABNORMAL LOW (ref 26.0–34.0)
MCHC: 31.1 g/dL (ref 30.0–36.0)
MCV: 82.9 fL (ref 80.0–100.0)
Platelets: 422 10*3/uL — ABNORMAL HIGH (ref 150–400)
RBC: 4.27 MIL/uL (ref 3.87–5.11)
RDW: 14.6 % (ref 11.5–15.5)
WBC: 9.7 10*3/uL (ref 4.0–10.5)
nRBC: 0 % (ref 0.0–0.2)

## 2019-09-18 LAB — COMPREHENSIVE METABOLIC PANEL
ALT: 34 U/L (ref 0–44)
AST: 20 U/L (ref 15–41)
Albumin: 2.5 g/dL — ABNORMAL LOW (ref 3.5–5.0)
Alkaline Phosphatase: 67 U/L (ref 38–126)
Anion gap: 9 (ref 5–15)
BUN: 10 mg/dL (ref 6–20)
CO2: 24 mmol/L (ref 22–32)
Calcium: 8.8 mg/dL — ABNORMAL LOW (ref 8.9–10.3)
Chloride: 109 mmol/L (ref 98–111)
Creatinine, Ser: 0.55 mg/dL (ref 0.44–1.00)
GFR calc Af Amer: >60 mL/min (ref >60)
GFR calc non Af Amer: >60 mL/min (ref >60)
Glucose, Bld: 94 mg/dL (ref 70–99)
Potassium: 3.9 mmol/L (ref 3.5–5.1)
Sodium: 142 mmol/L (ref 135–145)
Total Bilirubin: 0.2 mg/dL — ABNORMAL LOW (ref 0.3–1.2)
Total Protein: 6.6 g/dL (ref 6.5–8.1)

## 2019-09-18 LAB — ECHOCARDIOGRAM COMPLETE
Area-P 1/2: 3.5 cm2
Height: 60.25 in
S' Lateral: 2.9 cm
Weight: 1760 oz

## 2019-09-18 LAB — VANCOMYCIN, TROUGH: Vancomycin Tr: <4 ug/mL — ABNORMAL LOW (ref 15–20)

## 2019-09-18 MED ORDER — RIVAROXABAN 10 MG PO TABS
10.0000 mg | ORAL_TABLET | Freq: Every day | ORAL | Status: DC
  Administered 2019-09-18 – 2019-09-19 (×2): 10 mg via ORAL
  Filled 2019-09-18 (×3): qty 1

## 2019-09-18 MED ORDER — KETOROLAC TROMETHAMINE 30 MG/ML IJ SOLN
30.0000 mg | Freq: Four times a day (QID) | INTRAMUSCULAR | Status: DC | PRN
  Administered 2019-09-18 – 2019-09-20 (×6): 30 mg via INTRAVENOUS
  Filled 2019-09-18 (×7): qty 1

## 2019-09-18 MED ORDER — KETOROLAC TROMETHAMINE 30 MG/ML IJ SOLN
30.0000 mg | Freq: Once | INTRAMUSCULAR | Status: AC
  Administered 2019-09-18: 30 mg via INTRAVENOUS
  Filled 2019-09-18: qty 1

## 2019-09-18 MED ORDER — VANCOMYCIN HCL 500 MG/100ML IV SOLN
500.0000 mg | Freq: Three times a day (TID) | INTRAVENOUS | Status: DC
  Administered 2019-09-18 – 2019-09-19 (×2): 500 mg via INTRAVENOUS
  Filled 2019-09-18 (×3): qty 100

## 2019-09-18 MED ORDER — QUETIAPINE FUMARATE 50 MG PO TABS
50.0000 mg | ORAL_TABLET | Freq: Every day | ORAL | Status: DC
  Administered 2019-09-18 – 2019-09-19 (×2): 50 mg via ORAL
  Filled 2019-09-18 (×2): qty 1

## 2019-09-18 MED ORDER — BUPRENORPHINE HCL-NALOXONE HCL 8-2 MG SL SUBL
1.0000 | SUBLINGUAL_TABLET | Freq: Three times a day (TID) | SUBLINGUAL | Status: DC
  Administered 2019-09-18 – 2019-09-19 (×3): 1 via SUBLINGUAL
  Filled 2019-09-18 (×3): qty 1

## 2019-09-18 MED ORDER — HYDROMORPHONE HCL 1 MG/ML IJ SOLN
0.5000 mg | Freq: Four times a day (QID) | INTRAMUSCULAR | Status: DC | PRN
  Administered 2019-09-19: 0.5 mg via INTRAVENOUS
  Filled 2019-09-18: qty 1

## 2019-09-18 MED ORDER — SODIUM CHLORIDE 0.9% FLUSH: 10.0000 mL | INTRAVENOUS | Status: DC | PRN

## 2019-09-18 MED ORDER — SODIUM CHLORIDE 0.9% FLUSH
10.0000 mL | Freq: Two times a day (BID) | INTRAVENOUS | Status: DC
  Administered 2019-09-19 – 2019-09-20 (×2): 10 mL
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
## 2019-09-18 NOTE — Progress Notes (Addendum)
Pharmacy Antibiotic Note  Summer Cain is a 34 y.o. female with a recent history of bacteremia and likely endocarditis in July - patient received 3 days of IV antibiotics and then left AMA.  She returned on 09/15/19 with chest pain.  Pharmacy has been consulted for vancomycin and cefepime dosing.  S/p I&D of right elbow/wrist on 09/17/19.  Renal function stable and vancomycin trough is sub-therapeutic at < 4 (goal 15-20 mcg/mL).  Afebrile, WBC WNL, LA 1.1  Plan: Increase vanc to 500mg  IV Q8H Cefepime 2gm IV Q8H Monitor renal fxn, micro data, repeat vanc trough tomorrow  Height: 5' 0.25" (153 cm) Weight: 49.9 kg (110 lb) IBW/kg (Calculated) : 46.08  Temp (24hrs), Avg:98.3 F (36.8 C), Min:97.7 F (36.5 C), Max:98.6 F (37 C)  Recent Labs  Lab 09/15/19 1254 09/15/19 1906 09/16/19 1844 09/16/19 2030 09/18/19 0728  WBC 9.2  --   --  7.5  --   CREATININE 0.62  --  0.58  --   --   LATICACIDVEN 1.1 1.1  --   --   --   VANCOTROUGH  --   --   --   --  <4*    Estimated Creatinine Clearance: 73.5 mL/min (by C-G formula based on SCr of 0.58 mg/dL).    Allergies  Allergen Reactions   Latex Swelling    Vanc 8/28 >> Cefepime 8/28 >>  8/31 VT < 4 on 500mg  q12 >> 500mg  q8  7/27 BCx - MRSA, Pseudomonas (pan sensitive), Serratia 8/28 BCx - NGTD 8/30 right elbow abscess - NGTD  [name] 8/27, PharmD, BCPS, BCCCP 09/18/2019, 9:19 AM

## 2019-09-18 NOTE — Progress Notes (Signed)
  Echocardiogram 2D Echocardiogram has been performed.  Augustine Radar 09/18/2019, 2:34 PM

## 2019-09-18 NOTE — Progress Notes (Addendum)
Subjective: This morning, Summer Cain is still experiencing the effects of withdrawal. She is anxious about needing to get high again and is very uncomfortable. She reports severe pain in the antecubital fossa where she had the I&D yesterday. She says the toradol does not help at all. Gram stain from I&D pending, cultures of abscess and blood are all no growth to date. MRSA positive in nares.   Objective:  Vital signs in last 24 hours: Vitals:   09/17/19 1809 09/17/19 1810 09/17/19 2037 09/18/19 0432  BP: 102/67 102/67 117/70 101/63  Pulse: 71 60 66 (!) 50  Resp: (!) 33 16 17 15   Temp:   97.7 F (36.5 C) 98.3 F (36.8 C)  TempSrc:   Axillary Oral  SpO2: 100% 98% 98% 98%  Weight:      Height:       Weight change:   Intake/Output Summary (Last 24 hours) at 09/18/2019 0854 Last data filed at 09/18/2019 09/20/2019 Gross per 24 hour  Intake 2225 ml  Output 20 ml  Net 2205 ml   Physical Exam Constitutional:  General: She is in distress. She is uncomfortable. Appearance: She is well-developed. She is not toxic-appearing or diaphoretic. Slightly disorganized. HENT:  Head: Normocephalic.  Mouth: Missing upper left lateral incisor--in its place is a brown to black, rotting stump protruding 2-3 mm from the gingiva surrounding a hole that drains purulent discharge Cardiovascular:  Rate and Rhythm: Normal rate and regular rhythm.  Pulses:  Carotid pulses are 2+on the right side and 2+on the left side. Radial pulses are 2+on the right side and 2+on the left side.  Dorsalis pedis pulses are 2+on the right side and 2+on the left side.  Posterior tibial pulses are 2+on the right side and 2+on the left side.  Heart sounds: 3/6 holosystolic murmur best heard at the left upper sternal border. No friction rub. No gallop. No S3 or S4 sounds.  Pulmonary:  Effort: No tachypnea, accessory muscle usage or respiratory distress.  Breath sounds: No  stridor. Decreased breath soundspresent, possibly secondary to poor air movement. No wheezing, rhonchi or rales.  Chest:  Chest wall: No mass, deformity, crepitus or edema.  Skin: General: Skin is warm and dry.  Coloration: Skin is not cyanotic.  Findings: No ecchymosis or erythema. Inflamed, red, subcutaneous nodules present throughout both UEs. Prominent red nodule in R antecubital fossa measuring 4-5 cm in diameter, photo in media tab. Postinflammatory hyperpigmentation from large former abscess on right dorsal foot. Nails: There is no clubbing.  Neurological:  General: No focal deficit present. Photosensitive. Mental Status: She is alert and oriented to person, place, and time.  Cranial Nerves: No cranial nerve deficit.  Psychiatric:  Mood and Affect: Uncomfortable. Behavior: Less interactive today than yesterday, speech is a little jumbled.   Assessment/Plan: Summer Cain is a 51 year oldwoman who injects drugswith history ofscoliosis, bacteremia, mitral valve endocarditis, and hepatitis C [facility] fevers andchest painadmitted to our service for high risk of bacteremia.   Principal Problem:   Soft tissue infection Active Problems:   Bipolar affective disorder, currently depressed, moderate (HCC)   IVDU (intravenous drug user)   Pleuritic chest pain   Opioid use disorder (HCC)  Risk of bacteremia Patient received incomplete treatment of polymicrobial bacteremia (Serratia, Pseudomonas and MRSA) at the end of July. Has remained afebrile here without leukocytosis. Will continue with empiric antibiotics while awaiting results of blood cultures obtained on admission. Risk for endocarditis. Patient has holosystolic murmur  and potential vegetation spotted on TTE at last hospitalization - TTE ordered - 8/28 blood cultures pending  - IV vancomycin day 4 (Day 1: 8/28), stop cefepime given blood cultures  still NGTD  Skin and soft tissue infection Present medial to the R AC fossa. Looks worse today, it is getting larger and more painful. Will continue with IV vancomycin. General surgery performed I&D on 8/30. - IV vancomycin day 4 (Day 1: 8/28), stop cefepime given blood cultures still NGTD  Opioid use disorder Last use ~2 am on 8/28. Withdrawal symptoms began on 8/29 in the evening but were worsened by PO use of suboxone rather than sublingual. - Suboxone 8 mg BID, emphasized importance of sublingual administration - Clonidine 0.1 mg BID - Lorazepam 0.5 mg q4hrs IV PRN  - Start dilaudid 0.5 mg IV q6hr PRN - Ketorolac 30 mg q6hrs IV PRN   Mood disorder Patient reports she plans to return to [facility] for psychiatric and substance abuse services after this admission. Reports taking 200mg  Seroquel and trazodone in the past prior to her most recent relapse. She says the 100 mg of trazodone had no effect. - Increase seroquel 25 to 50 mg QHS today, can continue tapering up as needed - Increase trazodone 100 to 150 mg QHS  Lower abdominal pain CT abdomen pelvis suggestive of pelvic congestion syndrome but otherwise unremarkable.  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 Will monitor for improvement on antibiotics, but it willlikelyrequireeventual dentalintervention. - Social work consult for IPV assessment  Diet: Regular VTE prophylaxis: Refusing lovenox, will switch to apixaban Prior to Admission Living Arrangement:home Anticipated Discharge Location:home Barriers to Discharge:completion of IV antibiotic treatment for possible bacteremia/endocarditis   LOS: 3 days   , Medical Student 09/18/2019, 8:54 AM  Pager: [phone]

## 2019-09-18 NOTE — Progress Notes (Signed)
    Subjective: Patient reports pain as mild.  Tolerating diet.  Urinating.  +Flatus.  No CP, SOB.   OOB   Objective:   VITALS:   Vitals:   09/17/19 1809 09/17/19 1810 09/17/19 2037 09/18/19 0432  BP: 102/67 102/67 117/70 101/63  Pulse: 71 60 66 (!) 50  Resp: (!) 33 16 17 15   Temp:   97.7 F (36.5 C) 98.3 F (36.8 C)  TempSrc:   Axillary Oral  SpO2: 100% 98% 98% 98%  Weight:      Height:       CBC Latest Ref Rng & Units 09/18/2019 09/16/2019 09/15/2019  WBC 4.0 - 10.5 K/uL 9.7 7.5 9.2  Hemoglobin 12.0 - 15.0 g/dL 11.0(L) 13.0 11.0(L)  Hematocrit 36 - 46 % 35.4(L) 41.9 36.1  Platelets 150 - 400 K/uL 422(H) 347 278   BMP Latest Ref Rng & Units 09/18/2019 09/16/2019 09/15/2019  Glucose 70 - 99 mg/dL 94 09/17/2019) 88  BUN 6 - 20 mg/dL 10 433(I) 9  Creatinine 0.44 - 1.00 mg/dL <9(J 1.88 4.16  Sodium 135 - 145 mmol/L 142 141 135  Potassium 3.5 - 5.1 mmol/L 3.9 4.0 3.3(L)  Chloride 98 - 111 mmol/L 109 109 99  CO2 22 - 32 mmol/L 24 23 27   Calcium 8.9 - 10.3 mg/dL 6.06) 8.9 9.0   Intake/Output      08/30 0701 - 08/31 0700 08/31 0701 - 09/01 0700   P.O. 775    I.V. (mL/kg) 350 (7)    IV Piggyback 1100    Total Intake(mL/kg) 2225 (44.6)    Urine (mL/kg/hr) 0 (0)    Blood 20    Total Output 20    Net +2205            Physical Exam: General: NAD.  Resting in bed Resp: No increased wob Cardio: regular rate and rhythm ABD soft Neurologically intact MSK RUE Neurovascularly intact Sensation intact distally Intact pulses distally Incision: dressing C/D/I Neurologically intact  Assessment: 1 Day Post-Op  S/P Procedure(s) (LRB): IRRIGATION AND DEBRIDEMENT RIGHT  ELBOW AND RIGHT WRIST (Right) by Dr. 9/31. Murphy on 09/17/19  Principal Problem:   Soft tissue infection Active Problems:   Bipolar affective disorder, currently depressed, moderate (HCC)   IVDU (intravenous drug user)   Pleuritic chest pain   Opioid use disorder (HCC)  ADDITIONAL DIAGNOSIS:    .    Plan:   Can change dressings tomorrow. Cover incisions with adhesive bandages or Tegaderm etc.  Pt. May shower after dressing change Incentive Spirometry Elevate and Apply ice  Showering: Keep dressing dry Follow - up plan: 1 week Contact information:  Jewel Baize MD, 09/19/19 PA-C Dispo: Per primary team  Please call with any questions.  Margarita Rana, PA-C 09/18/2019, 11:30 AM

## 2019-09-19 ENCOUNTER — Inpatient Hospital Stay (HOSPITAL_COMMUNITY): Payer: Medicaid Other

## 2019-09-19 LAB — COMPREHENSIVE METABOLIC PANEL
ALT: 35 U/L (ref 0–44)
AST: 23 U/L (ref 15–41)
Albumin: 2.3 g/dL — ABNORMAL LOW (ref 3.5–5.0)
Alkaline Phosphatase: 57 U/L (ref 38–126)
Anion gap: 9 (ref 5–15)
BUN: 11 mg/dL (ref 6–20)
CO2: 24 mmol/L (ref 22–32)
Calcium: 8.5 mg/dL — ABNORMAL LOW (ref 8.9–10.3)
Chloride: 108 mmol/L (ref 98–111)
Creatinine, Ser: 0.65 mg/dL (ref 0.44–1.00)
GFR calc Af Amer: >60 mL/min (ref >60)
GFR calc non Af Amer: >60 mL/min (ref >60)
Glucose, Bld: 88 mg/dL (ref 70–99)
Potassium: 3.8 mmol/L (ref 3.5–5.1)
Sodium: 141 mmol/L (ref 135–145)
Total Bilirubin: 0.4 mg/dL (ref 0.3–1.2)
Total Protein: 5.7 g/dL — ABNORMAL LOW (ref 6.5–8.1)

## 2019-09-19 LAB — BASIC METABOLIC PANEL
Anion gap: 9 (ref 5–15)
BUN: 14 mg/dL (ref 6–20)
CO2: 23 mmol/L (ref 22–32)
Calcium: 8.7 mg/dL — ABNORMAL LOW (ref 8.9–10.3)
Chloride: 110 mmol/L (ref 98–111)
Creatinine, Ser: 0.65 mg/dL (ref 0.44–1.00)
GFR calc Af Amer: >60 mL/min (ref >60)
GFR calc non Af Amer: >60 mL/min (ref >60)
Glucose, Bld: 109 mg/dL — ABNORMAL HIGH (ref 70–99)
Potassium: 3 mmol/L — ABNORMAL LOW (ref 3.5–5.1)
Sodium: 142 mmol/L (ref 135–145)

## 2019-09-19 LAB — CBC
HCT: 32.3 % — ABNORMAL LOW (ref 36.0–46.0)
Hemoglobin: 9.8 g/dL — ABNORMAL LOW (ref 12.0–15.0)
MCH: 25.3 pg — ABNORMAL LOW (ref 26.0–34.0)
MCHC: 30.3 g/dL (ref 30.0–36.0)
MCV: 83.5 fL (ref 80.0–100.0)
Platelets: 331 10*3/uL (ref 150–400)
RBC: 3.87 MIL/uL (ref 3.87–5.11)
RDW: 15 % (ref 11.5–15.5)
WBC: 6.2 10*3/uL (ref 4.0–10.5)
nRBC: 0 % (ref 0.0–0.2)

## 2019-09-19 MED ORDER — CIPROFLOXACIN HCL 500 MG PO TABS
500.0000 mg | ORAL_TABLET | Freq: Two times a day (BID) | ORAL | Status: DC
  Administered 2019-09-19 – 2019-09-20 (×2): 500 mg via ORAL
  Filled 2019-09-19 (×2): qty 1

## 2019-09-19 MED ORDER — DOXYCYCLINE HYCLATE 100 MG PO TABS
100.0000 mg | ORAL_TABLET | Freq: Two times a day (BID) | ORAL | Status: DC
  Administered 2019-09-19 – 2019-09-20 (×2): 100 mg via ORAL
  Filled 2019-09-19 (×2): qty 1

## 2019-09-19 MED ORDER — POTASSIUM CHLORIDE 20 MEQ PO PACK
20.0000 meq | PACK | Freq: Three times a day (TID) | ORAL | Status: DC
  Administered 2019-09-19 – 2019-09-20 (×2): 20 meq via ORAL
  Filled 2019-09-19 (×2): qty 1

## 2019-09-19 MED ORDER — METHADONE HCL 10 MG PO TABS
20.0000 mg | ORAL_TABLET | Freq: Every day | ORAL | Status: DC
  Administered 2019-09-19 – 2019-09-20 (×2): 20 mg via ORAL
  Filled 2019-09-19 (×2): qty 2

## 2019-09-19 MED ORDER — DOXYCYCLINE HYCLATE 100 MG PO TABS
100.0000 mg | ORAL_TABLET | Freq: Two times a day (BID) | ORAL | Status: DC
  Administered 2019-09-19: 100 mg via ORAL
  Filled 2019-09-19: qty 1

## 2019-09-19 NOTE — Plan of Care (Signed)
  Problem: Education: Goal: Knowledge of General Education information will improve Description Including pain rating scale, medication(s)/side effects and non-pharmacologic comfort measures Outcome: Progressing   

## 2019-09-19 NOTE — TOC CAGE-AID Note (Signed)
Transition of Care [facility]) - CAGE-AID Screening   Patient Details  Name: Summer Cain MRN: [id number] Date of Birth: 1985/08/03  Transition of Care [facility]) CM/SW Contact:    Emeterio Reeve, [place] Phone Number: 09/19/2019, 3:29 PM   Clinical Narrative: CSW met with pt at bedside. CSW introduced self and explained her role at the hospital.  Pt reports she was on suboxone and was able to maintain sobriety for 3 years. Pt reports she tapered off successfully but relapsed within 3 months. After that pt was suing substances until returning to suboxone this admission. Pt reports since being back on it she has had multiple side effects which she now wants to switch her medication to methadone.   Pt became tearful and expressed dissatisfaction in her relapse. Pt reports she doesn't have a support system and that's why she relapsed. Pt reports she feels alone in the world.  CSW recommended that pt look into individual counseling to get substance use and mental health support.   CSW gave pt resource packet and encouraged pt to call the MAT facilities on the back page to find out what days they offer intake and to confirm she has all neccessary documents.   CAGE-AID Screening:    Have You Ever Felt You Ought to Cut Down on Your Drinking or Drug Use?: Yes Have People Annoyed You By Critizing Your Drinking Or Drug Use?: Yes Have You Felt Bad Or Guilty About Your Drinking Or Drug Use?: Yes Have You Ever Had a Drink or Used Drugs First Thing In The Morning to Steady Your Nerves or to Get Rid of a Hangover?: Yes CAGE-AID Score: 4  Substance Abuse Education Offered: Yes  Substance abuse interventions: Patient Counseling  Emeterio Reeve, Latanya Presser, [place] Social Worker [phone]

## 2019-09-19 NOTE — Progress Notes (Signed)
Subjective: This morning, Summer Cain continues to be uncomfortable due to withdrawal symptoms. She reports continued worsening of her symptoms with each sublingual administration of suboxone and has been refusing the last several doses of suboxone because of this. She says if this isn't resolved she will need to leave because she is so uncomfortable.  She reports some L-sided chest pain but it is much less than the chest pain she came in with and is worsened by movement. She would like reassurance that it is benign.  The pain in her arm from the I&Ds is still present but she has more use of her arm than when they were actively infected.  Objective:  Vital signs in last 24 hours: Vitals:   09/17/19 2037 09/18/19 0432 09/18/19 2208 09/19/19 0610  BP: 117/70 101/63 (!) 110/59 132/74  Pulse: 66 (!) 50 65 (!) 55  Resp: 17 15 16 16   Temp: 97.7 F (36.5 C) 98.3 F (36.8 C) 97.9 F (36.6 C) 98.2 F (36.8 C)  TempSrc: Axillary Oral Oral Oral  SpO2: 98% 98% 97% 99%  Weight:      Height:       Weight change:   Intake/Output Summary (Last 24 hours) at 09/19/2019 0732 Last data filed at 09/19/2019 0610 Gross per 24 hour  Intake 740 ml  Output --  Net 740 ml   TTE 8/31 showed "Left atrial size was mildly dilated. The mitral valve is normal in structure. Mild mitral valve regurgitation. No evidence of mitral stenosis." All other findings normal.  Physical Exam Constitutional:  General: She is uncomfortable but is standing in her room as well as sitting on the edge of her bed. Appearance: She is well-developed. She is not toxic-appearing or diaphoretic. Appears tired. HENT:  Head: Normocephalic.  Mouth: Missing upper left lateral incisor--in its place is a brown to black, rotting stump protruding 2-3 mm from the gingiva surrounding a hole that drains purulent discharge Cardiovascular:  Rate and Rhythm: Normal rate and regular rhythm.  Pulses:  Carotid pulses are  2+on the right side and 2+on the left side. Radial pulses are 2+on the right side and 2+on the left side.  Dorsalis pedis pulses are 2+on the right side and 2+on the left side.  Posterior tibial pulses are 2+on the right side and 2+on the left side.  Heart sounds:2/6 holosystolic murmur best heard at the left upper sternal border.No friction rub. No gallop. No S3 or S4 sounds.  Pulmonary:  Effort: No tachypnea, accessory muscle usage or respiratory distress.  Breath sounds: No stridor. No wheezing, rhonchi or rales.  Chest:  Chest wall: No mass, deformity, crepitus or edema.  Skin: General: Skin is warm and dry.  Coloration: Skin is not cyanotic.  Findings: No ecchymosis or erythema. Surgical sites in RUE (antecubital fossa and anterior wrist) healing well and bandaged appropriately. Nails: There is no clubbing.  Neurological:  General: No focal deficit present. Mental Status: She is alert and oriented to person, place, and time.  Cranial Nerves: No cranial nerve deficit.  Psychiatric:  Mood and Affect: Uncomfortable. Behavior: More interactive today than yesterday. She is happy to see 9/31.   Assessment/Plan: Summer Cain is a 68 year oldwoman who injects drugswith history ofscoliosis, bacteremia, mitral valve endocarditis, and hepatitis C [facility] fevers andchest painadmitted to our service for high risk of bacteremia.   Principal Problem:   Soft tissue infection Active Problems:   Bipolar affective disorder, currently depressed, moderate (HCC)  IVDU (intravenous drug user)   Pleuritic chest pain   Opioid use disorder (HCC)  Potential risk of bacteremia Patient received incomplete treatment of polymicrobial bacteremia (Serratia, Pseudomonas and MRSA) at the end of July. Has remained afebrile here without leukocytosis. Given cultures are NGTD, she has no signs of  sepsis, and TTE on 8/31 showed no mitral valve structural abnormality and only mild mitral regurgitation, we are stopping vancomycin today. - 8/28 blood cultures still NGTD - IV vancomycin stopped before day5 dose(Day 1: 8/28), stopped cefepime on 8/31  Skin and soft tissue infection Present medial to the R AC fossa and on anterior R wrist. General surgery performed I&D on 8/30. Per their assessment today, the sites look much better today. - Stopped IV vancomycin before day5(Day 1: 8/28) - Start doxycycline 100 mg q12 PO for three more days (Day 1: 9/1) - F/u in surgery clinic in 1-2 weeks with Dr. Margarita Rana  Opioid use disorder Last use ~2 am on 8/28.Withdrawal symptoms began on 8/29 in the evening but were worsened by suboxone. Since she is now refusing it every time, we will switch her to methadone and give her the information for an outpatient methadone clinic. - Discontinuing suboxone today - Will start methadone 20 mg daily - Clonidine 0.1 mg BID - Lorazepam 0.5 mg q4hrs IV PRN  - Dilaudid 0.5 mg IV q6hr PRN - Ketorolac 30 mg q6hrs IV PRN  Mood disorder Patient reports she plans to return to [facility] for psychiatric and substance abuse services after this admission. Reports taking 200mg  Seroquel and trazodone in the past prior to her most recent relapse.  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 Will monitor for improvement on antibiotics, but it willlikelyrequireeventual dentalintervention. - Social work consulted for IPV assessment, recommended reporting any  further information discovered about the situation  Chest pain Improving, but would like reassurance it is benign. Likely MSK in nature given worsening with movement and not pleuritic. - CXR 2-view ordered  Diet: Regular VTE prophylaxis: Rivaroxaban 10 mg PO daily Prior to Admission Living Arrangement:Home (boarding house) Anticipated Discharge Location:Home (boarding house) Barriers to Discharge:Pain, opioid withdrawal  LOS: 4 days   , Medical Student 09/19/2019, 7:32 AM

## 2019-09-19 NOTE — Social Work (Addendum)
12:54pm- Per MD pt would like to switch to methadone from suboxone, they have started this process in house. CSW colleague Tamera Punt to meet with pt.    10:27am- CSW received additional consult regarding methadone clinic information- pt already active with suboxone clinic in community.   Inquired from MD about reason for consult (if pt would like to change etc?)  Octavio Graves, MSW, LCSW [facility] Clinical Social Work

## 2019-09-19 NOTE — Progress Notes (Signed)
    Subjective: 34 yof POD # 2 s/p I&D of the right elbow/wrist.  Patient reports pain as Severe.  Tolerating diet.  Urinating.  +Flatus.  No CP, SOB  OOB paraesthesia   Objective:   VITALS:   Vitals:   09/17/19 2037 09/18/19 0432 09/18/19 2208 09/19/19 0610  BP: 117/70 101/63 (!) 110/59 132/74  Pulse: 66 (!) 50 65 (!) 55  Resp: 17 15 16 16   Temp: 97.7 F (36.5 C) 98.3 F (36.8 C) 97.9 F (36.6 C) 98.2 F (36.8 C)  TempSrc: Axillary Oral Oral Oral  SpO2: 98% 98% 97% 99%  Weight:      Height:       CBC Latest Ref Rng & Units 09/19/2019 09/18/2019 09/16/2019  WBC 4.0 - 10.5 K/uL 6.2 9.7 7.5  Hemoglobin 12.0 - 15.0 g/dL 09/18/2019) 11.0(L) 13.0  Hematocrit 36 - 46 % 32.3(L) 35.4(L) 41.9  Platelets 150 - 400 K/uL 331 422(H) 347   BMP Latest Ref Rng & Units 09/19/2019 09/18/2019 09/16/2019  Glucose 70 - 99 mg/dL 88 94 09/18/2019)  BUN 6 - 20 mg/dL 11 10 809(X)  Creatinine 0.44 - 1.00 mg/dL <8(P 3.82 5.05  Sodium 135 - 145 mmol/L 141 142 141  Potassium 3.5 - 5.1 mmol/L 3.8 3.9 4.0  Chloride 98 - 111 mmol/L 108 109 109  CO2 22 - 32 mmol/L 24 24 23   Calcium 8.9 - 10.3 mg/dL 3.97) ) 8.9   Intake/Output      08/31 0701 - 09/01 0700 09/01 0701 - 09/02 0700   P.O. 440    I.V. (mL/kg)     IV Piggyback 300    Total Intake(mL/kg) 740 (14.8)    Urine (mL/kg/hr)     Blood     Total Output     Net +740         Urine Occurrence 1 x       Physical Exam: General: NAD. Comfortable despite report of severe pain.  Neurologically intact MSK Neurovascularly intact Sensation intact distally Intact pulses distally Dorsiflexion/Plantar flexion intact Dressings changed by me.  Incision: well approximated; looks much better today. Incision: no drainage No cellulitis present Compartment soft  Assessment: 2 Days Post-Op  S/P Procedure(s) (LRB): IRRIGATION AND DEBRIDEMENT RIGHT  ELBOW AND RIGHT WRIST (Right) by Dr. 11/01. Murphy on 09/17/19  Principal Problem:   Soft tissue  infection Active Problems:   Bipolar affective disorder, currently depressed, moderate (HCC)   IVDU (intravenous drug user)   Pleuritic chest pain   Opioid use disorder (HCC)  Incisions are well approximated; clean. No s/s of active infection present around incision sites. No active drainage noted.   Plan:  Continue ABX therapy as per ID recommendations.  Incentive Spirometry Elevate and Apply ice  Weightbearing: WBAT RUE Insicional and dressing care: Okay to change dressings as needed by RN staff. Can placed band-aids over sites.  Orthopedic device(s): None Showering: May shower. Replace dressings after shower until follow up.  VTE prophylaxis: SCDs, ambulation Pain control: per medical team.  Follow - up plan: 1-2 weeks with Dr. Jewel Baize information:  09/19/19 MD, Adora Fridge  Dispo: Home as per medial team. Orthopedic team will s/o from here. Please call with any questions or concerns.    Margarita Rana, PA-C 09/19/2019, 8:12 AM

## 2019-09-19 NOTE — Op Note (Signed)
09/17/2019  1:10 PM  PATIENT:  Summer Cain    PRE-OPERATIVE DIAGNOSIS:  Right elbow abscess  POST-OPERATIVE DIAGNOSIS:  Same  PROCEDURE:  IRRIGATION AND DEBRIDEMENT RIGHT  ELBOW AND RIGHT WRIST  SURGEON:  Sheral Apley, MD  ASSISTANT: Daun Peacock, PA-C, he was present and scrubbed throughout the case, critical for completion in a timely fashion, and for retraction, instrumentation, and closure.   ANESTHESIA:   gen  PREOPERATIVE INDICATIONS:  [name] is a  34 y.o. female with a diagnosis of Right elbow abscess who failed conservative measures and elected for surgical management.    The risks benefits and alternatives were discussed with the patient preoperatively including but not limited to the risks of infection, bleeding, nerve injury, cardiopulmonary complications, the need for revision surgery, among others, and the patient was willing to proceed.  OPERATIVE IMPLANTS: none  OPERATIVE FINDINGS: purulent fluid  BLOOD LOSS: min  COMPLICATIONS: none  TOURNIQUET TIME: none  OPERATIVE PROCEDURE:  Patient was identified in the preoperative holding area and site was marked by me She was transported to the operating theater and placed on the table in supine position taking care to pad all bony prominences. After a preincinduction time out anesthesia was induced. The right upper extremity was prepped and draped in normal sterile fashion and a pre-incision timeout was performed. She received scheduled for preoperative antibiotics.   I first made an incision over her distal forearm abscess.  I expressed purulent fluid from this this was sent for culture  I thoroughly irrigated this  I debrided all nonvitalized tissue this was an excisional debridement using scissors and a pickup  I performed a tenolysis at her wrist flexor tendon FCR.  I then performed a complex closure of this abscess.  Next I turned my attention more proximally at her elbow on the ulnar aspect  I incised both abscesses here through a transverse incision and expressed all purulent fluid  I thoroughly irrigated  I performed an excisional debridement with scissor and pickups of all devitalized tissue  I performed a tenolysis of her flexor tendons at the forearm  I then performed a complex closure    POST OPERATIVE PLAN: Mobilize for DVT prophylaxis continue antibiotics and any other DVT prophylaxis per the primary team

## 2019-09-20 ENCOUNTER — Inpatient Hospital Stay: Payer: Medicaid Other

## 2019-09-20 DIAGNOSIS — Z23 Encounter for immunization: Secondary | ICD-10-CM

## 2019-09-20 LAB — BASIC METABOLIC PANEL
Anion gap: 7 (ref 5–15)
BUN: 14 mg/dL (ref 6–20)
CO2: 26 mmol/L (ref 22–32)
Calcium: 8.6 mg/dL — ABNORMAL LOW (ref 8.9–10.3)
Chloride: 107 mmol/L (ref 98–111)
Creatinine, Ser: 0.59 mg/dL (ref 0.44–1.00)
GFR calc Af Amer: >60 mL/min (ref >60)
GFR calc non Af Amer: >60 mL/min (ref >60)
Glucose, Bld: 100 mg/dL — ABNORMAL HIGH (ref 70–99)
Potassium: 3.8 mmol/L (ref 3.5–5.1)
Sodium: 140 mmol/L (ref 135–145)

## 2019-09-20 LAB — CULTURE, BLOOD (ROUTINE X 2)
Culture: NO GROWTH
Culture: NO GROWTH
Special Requests: ADEQUATE

## 2019-09-20 LAB — CBC
HCT: 32.8 % — ABNORMAL LOW (ref 36.0–46.0)
Hemoglobin: 10 g/dL — ABNORMAL LOW (ref 12.0–15.0)
MCH: 25.5 pg — ABNORMAL LOW (ref 26.0–34.0)
MCHC: 30.5 g/dL (ref 30.0–36.0)
MCV: 83.7 fL (ref 80.0–100.0)
Platelets: 349 10*3/uL (ref 150–400)
RBC: 3.92 MIL/uL (ref 3.87–5.11)
RDW: 14.8 % (ref 11.5–15.5)
WBC: 5.3 10*3/uL (ref 4.0–10.5)
nRBC: 0 % (ref 0.0–0.2)

## 2019-09-20 MED ORDER — TRAZODONE HCL 100 MG PO TABS: 100.0000 mg | ORAL_TABLET | Freq: Every day | ORAL | 1 refills | Status: DC

## 2019-09-20 MED ORDER — QUETIAPINE FUMARATE 50 MG PO TABS: 50.0000 mg | ORAL_TABLET | Freq: Every day | ORAL | 1 refills | Status: DC

## 2019-09-20 MED ORDER — DOXYCYCLINE HYCLATE 100 MG PO TABS: 100.0000 mg | ORAL_TABLET | Freq: Two times a day (BID) | ORAL | 0 refills | Status: AC

## 2019-09-20 MED ORDER — CIPROFLOXACIN HCL 500 MG PO TABS: 500.0000 mg | ORAL_TABLET | Freq: Two times a day (BID) | ORAL | 0 refills | Status: AC

## 2019-09-20 MED FILL — CIPROFLOXACIN HCL 500 MG TA: 500 | 2 days supply | Qty: 3 | Fill #0

## 2019-09-20 MED FILL — DOXYCYCLINE HYCLATE 100 MG: 100 | 2 days supply | Qty: 3 | Fill #0

## 2019-09-20 NOTE — Progress Notes (Signed)
Subjective: Summer Cain is feeling much better this morning. She had good effects from the methadone, with significant improvement in her withdrawal symptoms following its administration. She wonders if we can increase the dose since she became tremulous several hours after the first dose as it was wearing off. Her R arm still hurts but it is improving.  Her boyfriend is present in the room with her this morning and the two of them looked at the list of methadone clinics together and will decide soon about which clinic she will attend. She also expressed interest in treating her hepatitis C outpatient and receiving her first COVID vaccine today before leaving.  Objective:  Vital signs in last 24 hours: Vitals:   09/19/19 0610 09/19/19 1254 09/19/19 2040 09/20/19 0618  BP: 132/74 114/76 (!) 141/88 130/75  Pulse: (!) 55 62 67 (!) 53  Resp: 16 16    Temp: 98.2 F (36.8 C) 97.9 F (36.6 C) 98.3 F (36.8 C) 98.1 F (36.7 C)  TempSrc: Oral Oral Oral Oral  SpO2: 99% 100% 100% 99%  Weight:      Height:       Weight change:   Intake/Output Summary (Last 24 hours) at 09/20/2019 0809 Last data filed at 09/19/2019 1015 Gross per 24 hour  Intake 240 ml  Output --  Net 240 ml   Physical Exam Constitutional:  General: She is comfortable and sitting up in bed. Appearance: She is well-developed. She is not toxic-appearing or diaphoretic. HENT:  Head: Normocephalic.  Mouth: Missing upper left lateral incisor--in its place is a brown to black, rotting stump protruding 2-3 mm from the gingiva surrounding a hole that drains purulent discharge Cardiovascular:  Rate and Rhythm: Normal rate and regular rhythm.  Pulses:  Carotid pulses are 2+on the right side and 2+on the left side. Radial pulses are 2+on the right side and 2+on the left side.  Dorsalis pedis pulses are 2+on the right side and 2+on the left side.  Posterior tibial pulses are 2+on the  right side and 2+on the left side.  Heart sounds:2/6 holosystolic murmur best heard at the left upper sternal border.No friction rub. No gallop. No S3 or S4 sounds.  Pulmonary:  Effort: No tachypnea, accessory muscle usage or respiratory distress.  Breath sounds: No stridor. No wheezing, rhonchi or rales.  Chest:  Chest wall: No mass, deformity, crepitus or edema.  Skin: General: Skin is warm and dry.  Coloration: Skin is not cyanotic.  Findings: No ecchymosis or erythema. Surgical sites in RUE (antecubital fossa and anterior wrist) healing well. Nails: There is no clubbing.  Neurological:  General: No focal deficit present. Mental Status: She is alert and oriented to person, place, and time.  Cranial Nerves: No cranial nerve deficit.  Psychiatric:  Mood and Affect:Happy, pleasant affect. Behavior:Interactive. She is happy to see [place].   Assessment/Plan: Summer Cain is a 35 year oldwoman who injects drugswith history ofscoliosis, bacteremia, mitral valve endocarditis, and hepatitis C [facility] with subjective fevers andchest painadmitted to our service for risk of bacteremia and soft tissue infections.  Principal Problem:   Soft tissue infection Active Problems:   Bipolar affective disorder, currently depressed, moderate (HCC)   IVDU (intravenous drug user)   Chest pain   Opioid use disorder (HCC)  Cellulitis w/ abscess 2/2 Gram - rods I&D sites healing well. Gram negative rods found on wound culture. - Stopped IV vancomycin before day5(Day 1: 8/28) - Started doxycycline 100 mg q12 PO for three  more days (Day 1: 9/1) - Started cipro 500 mg BID PO 9/1 for Gram negative and pseudomonas/serratia coverage, will finish 9/4 - F/u in surgery clinic in 1-2 weeks with Dr. Margarita Rana  Chest pain/CXR opacities Her chest pain is gone, but her repeat CXR on 9/1 showed new diffuse interstitial densities  in L lung, representing either inflammation or edema. Antibiotic coverage given thus far sufficient for treating any pneumonia. - Doxycycline started 9/1, will finish 9/4  Opioid use disorder Feeling much better today. Switched her to methadone 9/1 and gave her the information for outpatient methadone clinics. - Methadone 20 mg daily - Clonidine 0.1 mg BID - Lorazepam 0.5 mg q4hrs IV PRN - Dilaudid 0.5 mg IV q6hr PRN -Ketorolac 30 mg q6hrs IV PRN  Mood disorder Reports taking200mg Seroquel and trazodone in the past prior to her most recent relapse. 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 Will monitor for improvement on antibiotics, but it willlikelyrequireeventual dentalintervention. - Social work consulted for IPV assessment, recommended reporting any further information discovered about the situation  Potential risk of bacteremia No longer a concern. Blood cultures NGTD.  COVID vaccination -Receiving first COVID vaccine today  Untreated hepatitis C Provided information about options for outpatient treatment.  Diet: Regular VTE prophylaxis:Rivaroxaban 10 mg PO daily, stop at discharge Prior to Admission Living Arrangement:Home (boarding house) Anticipated Discharge Location:Home (boarding house) Barriers to Discharge:None, discharging today   LOS: 5 days   11/1, Medical Student 09/20/2019, 8:09 AM  [phone]

## 2019-09-20 NOTE — Progress Notes (Signed)
   Covid-19 Vaccination Clinic  Name:  Fanta Wimberley    MRN: [id number] DOB: 11/22/1985  09/20/2019  Ms. Redner was observed post Covid-19 immunization for 15 minutes without incident. She was provided with Vaccine Information Sheet and instruction to access the V-Safe system.   Ms. Cihlar was instructed to call 911 with any severe reactions post vaccine: Marland Kitchen Difficulty breathing  . Swelling of face and throat  . A fast heartbeat  . A bad rash all over body  . Dizziness and weakness   Immunizations Administered    Name Date Dose VIS Date Route   Pfizer COVID-19 Vaccine 09/20/2019 12:51 PM 0.3 mL 03/14/2018 Intramuscular   Manufacturer: [employer], [employer]   Lot: [id number]   NDC: [address]-7

## 2019-09-20 NOTE — Progress Notes (Signed)
° °  Covid-19 Vaccination Clinic  Name:  Summer Cain    MRN: [id number] DOB: 03/25/1985  09/20/2019  Summer Cain was observed post Covid-19 immunization for 15 minutes without incident. She was provided with Vaccine Information Sheet and instruction to access the V-Safe system.   Summer Cain was instructed to call 911 with any severe reactions post vaccine: . Difficulty breathing  . Swelling of face and throat  . A fast heartbeat  . A bad rash all over body  . Dizziness and weakness   Immunizations Administered    Name Date Dose VIS Date Route   Pfizer COVID-19 Vaccine 09/20/2019 12:51 PM 0.3 mL 03/14/2018 Intramuscular   Manufacturer: Pfizer, Inc   Lot: [id number]   NDC: [address]-2      

## 2019-09-20 NOTE — Progress Notes (Signed)
Summer Cain to be D/C'd per MD order. Discussed with the patient and all questions fully answered. ? VSS, Skin clean, dry and intact without evidence of skin break down, no evidence of skin tears noted. ? IV catheter discontinued intact. Site without signs and symptoms of complications. Dressing and pressure applied. ? An After Visit Summary was printed and given to the patient. Patient informed where to pickup prescriptions. ? D/c education completed with patient/family including follow up instructions, medication list, d/c activities limitations if indicated, with other d/c instructions as indicated by MD - patient able to verbalize understanding, all questions fully answered.  ? Patient instructed to return to ED, call 911, or call MD for any changes in condition.  ? Patient to be escorted via WC, and D/C home via [employer].

## 2019-09-20 NOTE — TOC Progression Note (Signed)
Transition of Care [facility]) - Progression Note    Patient Details  Name: Summer Cain MRN: [id number] Date of Birth: 10-02-1985  Transition of [facility]) CM/SW Contact  Doy Hutching, [place] Phone Number: 09/20/2019, 10:30 AM  Clinical Narrative:    Appointment made for pt at [facility] for next Wednesday 9/8 at 2:30pm w/ Dr. Jillyn Hidden.   Expected Discharge Plan: Home/Self Care Barriers to Discharge: Continued Medical Work up  Expected Discharge Plan and Services Expected Discharge Plan: Home/Self Care Living arrangements for the past 2 months: Boarding House  Readmission Risk Interventions No flowsheet data found.

## 2019-09-21 ENCOUNTER — Telehealth: Payer: Self-pay | Admitting: *Deleted

## 2019-09-21 NOTE — Telephone Encounter (Signed)
Patient called, no answer. Left message for patient to return call to [facility], [phone]. Patient will need to be informed of potential exposure to employee who later tested positive for COVID-19 and offered testing. If patient returns call please transfer to [facility].

## 2019-09-21 NOTE — Discharge Summary (Signed)
Name: Summer Cain MRN: [id number] DOB: 05-Apr-1985 34 y.o. PCP: Patient, No Pcp Per  Date of Admission: 09/15/2019  4:00 AM Date of Discharge: 09/20/2019 Attending Physician: Dr. Criselda Peaches  Discharge Diagnosis: Principal Problem:   Soft tissue infection Active Problems:   Bipolar affective disorder, currently depressed, moderate (HCC)   IVDU (intravenous drug user)   Chest pain   Opioid use disorder [facility])    Discharge Medications: Allergies as of 09/20/2019      Reactions   Latex Swelling      Medication List    TAKE these medications   ciprofloxacin 500 MG tablet Commonly known as: CIPRO Take 1 tablet (500 mg total) by mouth 2 (two) times daily for 3 doses.   doxycycline 100 MG tablet Commonly known as: VIBRA-TABS Take 1 tablet (100 mg total) by mouth every 12 (twelve) hours for 3 doses.   QUEtiapine 50 MG tablet Commonly known as: SEROQUEL Take 1 tablet (50 mg total) by mouth at bedtime.   traZODone 100 MG tablet Commonly known as: DESYREL Take 1 tablet (100 mg total) by mouth at bedtime.       Disposition and follow-up:   Summer Cain was discharged from [facility] in Good condition.  At the hospital follow up visit please address:  1.  Follow-up:  A. Gram negative Abscess - make sure wounds are clean and well healed. Make sure she has surgical follow up and suture removal.     B. Opioid used disorder - she was given instructions and information regarding Methadone clinics.    C. Mood disorder - she was started back on Seroquel during this hospitalization. Make sure she get behavioral health f/u   2.  Labs / imaging needed at time of follow-up: CBC and CMP  3.  Pending labs/ test needing follow-up: none  Follow-up Appointments:  Follow-up Information    Sheral Apley, MD In 1 week.   Specialty: Orthopedic Surgery Contact information: [address] [place] [address] [phone]        [facility] Follow up on 09/27/2019.   Why: Your appointment is at 2:30pm.  Contact information: [address] [place] [address] [phone]              Hospital Course by problem list: Summer Cain is a 34 year old woman who injects drugs with history of scoliosis, bacteremia, mitral valve endocarditis, and hepatitis C who presented to the [facility] ED with subjective fevers and chest pain admitted to our service for high risk of bacteremia.   Risk of bacteremia Patient received incomplete treatment of polymicrobial bacteremia (Serratia, Pseudomonas and MRSA) at the end of July. Remained afebrile here without leukocytosis. Continued empiric antibiotics while awaiting results of blood cultures obtained on admission. She is at risk for endocarditis due to IVDU and given presence of holosystolic murmur. TTE on 8/31 revealed no mitral valve vegetation. Given no apparent bacteremia and cultures showing no growth, cefepime was stopped in the morning of day 4 and vancomycin was stopped after its final administration later that day.   Skin and soft tissue infection Most prominent infection is medial to the R AC fossa. After the first day of hospitalization, this abscess continued to expand so general surgery performed an I&D on 8/30. Patient will follow up in surgery clinic with Dr. Margarita Rana 1-2 weeks after discharge. Vancomycin was replaced with doxycycline on day 5 to complete a 7-day course  of antibiotics while providing MRSA coverage given her MRSA positivity. Wound culture grew Gram negative rods on 9/1 and ciprofloxacin was added for adequate coverage. The cultures grew pseudomonas and serratia on 9/2, so cipro continuation was felt to be appropriate.  Opioid use disorder Last use ~2 am on 8/28. Withdrawal symptoms began late 8/29 but were worsened by PO use of suboxone rather than sublingual. On 8/30, we emphasized the importance of  sublingual administration and added clonidine 0.1 mg BID, lorazepam 0.5 mg q4hrs IV PRN, Ketorolac 30 mg q6hrs IV PRN. On 9/1, since the suboxone was still causing her withdrawal to worsen, we switched to methadone 20 mg oral daily with good effect. She is selecting a methadone clinic and will receive methadone prescriptions there.   Mood disorder Reports taking Seroquel and trazodone in the past prior to her most recent relapse. [facility] this admission. Seroquel was started at 25 mg QHS and then increased to 50 mg QHS on 8/31. She was also given trazodone 100 mg QHS throughout her stay.   Lower abdominal pain She presented with severe RUQ and RLQ pain. CT abdomen pelvis was suggestive of pelvic congestion syndrome but otherwise unremarkable. Outpatient f/u recommended to patient.   Broken and infected incisor Given history of draining abscess in this area and continued purulence, it is likely that this tooth is infected. Will monitor for improvement on antibiotics, but it will likely require eventual dental intervention. Social work was consulted for IPV assessment and provided guidance to the patient.  Chest pain One of her presenting symptoms was left-sided chest pain. CXR on admission showed interstitial opacities and CXR on 9/1 showed increased opacities, but the numerous antibiotics she has been on should provide adequate coverage if these opacities represent a pneumonia of some kind.    Discharge Vitals:   BP 132/74 (BP Location: Right Leg)   Pulse 67   Temp 98 F (36.7 C) (Oral)   Resp 17   Ht 5' 0.25" (1.53 m)   Wt 49.9 kg   SpO2 98%   BMI 21.30 kg/m   Pertinent Labs, Studies, and Procedures:  CBC Latest Ref Rng & Units 09/20/2019 09/19/2019 09/18/2019  WBC 4.0 - 10.5 K/uL 5.3 6.2 9.7  Hemoglobin 12.0 - 15.0 g/dL 10.0(L) 9.8(L) 11.0(L)  Hematocrit 36 - 46 % 32.8(L) 32.3(L) 35.4(L)  Platelets 150 - 400 K/uL  349 331 422(H)    CMP Latest Ref Rng & Units 09/20/2019 09/19/2019 09/19/2019  Glucose 70 - 99 mg/dL 409(W100(H) 119(J109(H) 88  BUN 6 - 20 mg/dL 14 14 11   Creatinine 0.44 - 1.00 mg/dL 4.780.59 2.950.65 6.210.65  Sodium 135 - 145 mmol/L 140 142 141  Potassium 3.5 - 5.1 mmol/L 3.8 3.0(L) 3.8  Chloride 98 - 111 mmol/L 107 110 108  CO2 22 - 32 mmol/L 26 23 24   Calcium 8.9 - 10.3 mg/dL 3.0(Q8.6(L) 6.5(H8.7(L) 8.4(O8.5(L)  Total Protein 6.5 - 8.1 g/dL - - 5.7(L)  Total Bilirubin 0.3 - 1.2 mg/dL - - 0.4  Alkaline Phos 38 - 126 U/L - - 57  AST 15 - 41 U/L - - 23  ALT 0 - 44 U/L - - 35    DG Chest 2 View  Result Date: 09/15/2019 CLINICAL DATA:  Chest pain EXAM: CHEST - 2 VIEW COMPARISON:  08/10/2019 FINDINGS: Interstitial prominence with patchy ill-defined density. No pleural effusion. No pneumothorax. Cardiomediastinal contours are stable with normal heart size. Levoscoliosis of  the lower thoracic spine. IMPRESSION: Interstitial opacities, which may reflect edema or atypical/viral pneumonia. Electronically Signed   By: Guadlupe Spanish M.D.   On: 09/15/2019 07:13   CT ABDOMEN PELVIS W CONTRAST  Result Date: 09/15/2019 CLINICAL DATA:  Right lower quadrant abdominal pain. Patient reports chest pain. Patient reports she needs treatment for "all my infections including my heart infections and mass in my groin ". Technologist notes state numerous skin abscesses. History of IV drug use. EXAM: CT ABDOMEN AND PELVIS WITH CONTRAST TECHNIQUE: Multidetector CT imaging of the abdomen and pelvis was performed using the standard protocol following bolus administration of intravenous contrast. CONTRAST:  OMNIPAQUE IOHEXOL 300 MG/ML  SOLN COMPARISON:  Right upper quadrant ultrasound 04/02/2019. FINDINGS: Lower chest: No pleural fluid. No confluent or nodular opacity in the lung bases. No obvious vegetation in the included heart. Hepatobiliary: Mild heterogeneity of hepatic parenchyma but no discrete focal lesion. Contracted gallbladder. No calcified  gallstone. No biliary dilatation. Pancreas: No ductal dilatation or inflammation. Spleen: Enlarged spanning 14.4 cm cranial caudal. No focal abnormality. Adrenals/Urinary Tract: No adrenal nodule. No hydronephrosis or perinephric edema. Homogeneous renal enhancement. Small cyst in the lower right kidney is well as cortical hypodensity too small to accurately characterize. Urinary bladder is physiologically distended without wall thickening. Stomach/Bowel: Bowel assessment is limited in the absence of enteric contrast and paucity of intra-abdominal fat. Ingested material distends the stomach. There is no gastric wall thickening. No small bowel obstruction. Equivocal wall thickening of the terminal ileum versus nondistention, series 3, image 63. The appendix is identified with moderate certainty, series 6, image 51, without evidence of appendicitis. Moderate volume of colonic stool. No abnormal rectal distention. Vascular/Lymphatic: Prominence of the gonadal veins. Normal caliber abdominal aorta. Patent portal vein. No bulky abdominopelvic adenopathy. No evidence of inguinal adenopathy. Reproductive: Retroverted uterus. There is prominent periuterine and adnexal vascularity. Dilatation of the ovarian veins measuring 6 mm on the left and 4 mm on the right. No adnexal mass. Other: Small amount of free fluid in the pelvis, may be reactive. No free air. No intra-abdominal or pelvic fluid collection. Small amount of air in the right anterior abdominal wall, series 3, image 59, nonspecific and may be related to subcutaneous injection. There is no evidence of subcutaneous fluid collection. 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 Mild scoliotic curvature of the lumbar spine. There is no bony destruction. No visualized intramuscular collection. IMPRESSION: 1. Equivocal wall thickening of the terminal ileum versus nondistention. In the setting of right lower quadrant pain this could  represent mild enteritis, either infectious or inflammatory including Crohn's disease. 2. No evidence of appendicitis. 3. Prominent periuterine and adnexal vascularity with dilatation of the ovarian veins, can be seen with pelvic congestion syndrome. 4. Splenomegaly. 5. 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 6. Small amount of air in the right anterior abdominal wall is nonspecific and may be related to subcutaneous injection. No subcutaneous fluid collection. Electronically Signed   By: Narda Rutherford M.D.   On: 09/15/2019 18:02     Discharge Instructions: Discharge Instructions    Diet - low sodium heart healthy   Complete by: As directed    Discharge instructions   Complete by: As directed    You were hospitalized for Abscess. Thank you for allowing [place] to be part of your care.    Please follow up with the following providers: 1. Primary Care Physician - please see discharge paperwork for date and time.  Please note these  changes made to your medications:   - Medications to continue: 1. Trazodone  2. Seroquel  3. Methadone 20 mg daily. Please follow up at a methadone clinic as soon as possible  - Medications to start: 1. Ciprofloxacin 500 mg twice daily for 1 and a half more days. 2. Doxycyline 100 mg twice daily for 1 and a half days.   - Medications to discontinue:   Please make sure to follow up with your Primary Care Doctor so you can get treatment for Hepatitis C  Please call our clinic if you have any questions or concerns, we may be able to help and keep you from a long and expensive emergency room wait. Our clinic and after hours phone number is [phone], the best time to call is Monday through Friday 9 am to 4 pm but there is always someone available 24/7 if you have an emergency. If you need medication refills please notify your pharmacy one week in advance and they will send [place] a request.   Increase activity slowly   Complete by: As  directed    No wound care   Complete by: As directed       Signed: Dellia Cloud, MD 09/21/2019, 10:26 AM   Pager: [phone]

## 2019-09-22 LAB — AEROBIC/ANAEROBIC CULTURE W GRAM STAIN (SURGICAL/DEEP WOUND)

## 2019-09-22 LAB — CARBAPENEM RESISTANCE PANEL
Carba Resistance IMP Gene: NOT DETECTED
Carba Resistance KPC Gene: NOT DETECTED
Carba Resistance NDM Gene: NOT DETECTED
Carba Resistance OXA48 Gene: NOT DETECTED
Carba Resistance VIM Gene: NOT DETECTED

## 2019-09-26 ENCOUNTER — Inpatient Hospital Stay: Payer: Medicaid Other | Admitting: Family Medicine

## 2019-11-09 ENCOUNTER — Telehealth: Payer: Self-pay | Admitting: Pediatric Intensive Care

## 2019-11-09 NOTE — Telephone Encounter (Signed)
Call to client. IDx2. Client referred by [facility] intern from field visit. CN introduced herself. Client wants call back later. CN explained that she would not be available to talk later. CN asked client to explain current medica needs. Client says that she has been in the hospital twice recently and left AMA once. She was in the hospital for "an infection". CN asked client if she was told that she needed medical follow up after hospital and client said "I think so". CN advised client that CN may be able to arrange [facility] [place] [facility] for [place] with [facility] staff. Client states she wants to go. CN advised going to ED if running high fever, high heart rate and/or feeling faint. CN states she will call back on Monday around 1100. Shann Medal RN BSN CNP [phone]

## 2019-11-14 ENCOUNTER — Encounter: Payer: Self-pay | Admitting: Pediatric Intensive Care

## 2019-11-14 NOTE — Telephone Encounter (Signed)
MMU is being repaired at this time. Patient/CM may contact MMU or visit website for a day to coordinate a visit and transportation next week. MMU team is currently at [facility] providing care on today.

## 2019-11-14 NOTE — Congregational Nurse Program (Signed)
  Dept: [facility] Nurse Program Note  Date of Encounter: 11/14/2019  Past Medical History: Past Medical History:  Diagnosis Date  . Pregnant   . Scoliosis     Encounter Details: Initial encounter with client. Met client with [facility] intern Fredderick Severance outside of client's house. CN reminded client of call last week. Client does recall speaking with CN. CN offered access to mobile clinic for follow up of treatment of multiple abscesses. Clientis in contact with [facility] who could coordinate transportation to mobile clinic tomorrow. CN will verify mobile clinic location and communicate this with client. Lisette Abu RN BSN CNP [phone]

## 2019-12-05 ENCOUNTER — Encounter: Payer: Self-pay | Admitting: Pediatric Intensive Care

## 2019-12-25 NOTE — Congregational Nurse Program (Signed)
  Dept: [phone]   Congregational Nurse Program Note  Date of Encounter: 12/05/2019  Past Medical History: Past Medical History:  Diagnosis Date  . Pregnant   . Scoliosis     Encounter Details: Follow up home visit with client. Client has had URI with productive cough, no fever but feels unwell. She does not have any active abscesses at this time. CN advises seeking medical attention if she has rapid heart rate, fever or difficulty breathing.  Shann Medal RN BSN CNP [phone]

## 2020-10-31 IMAGING — DX DG CHEST 2V
2 series · 2 of 2 positions shown · non-contrast
Comparison: 08/10/2019

CLINICAL DATA: Chest pain

EXAM:
CHEST - 2 VIEW

[chest pa]
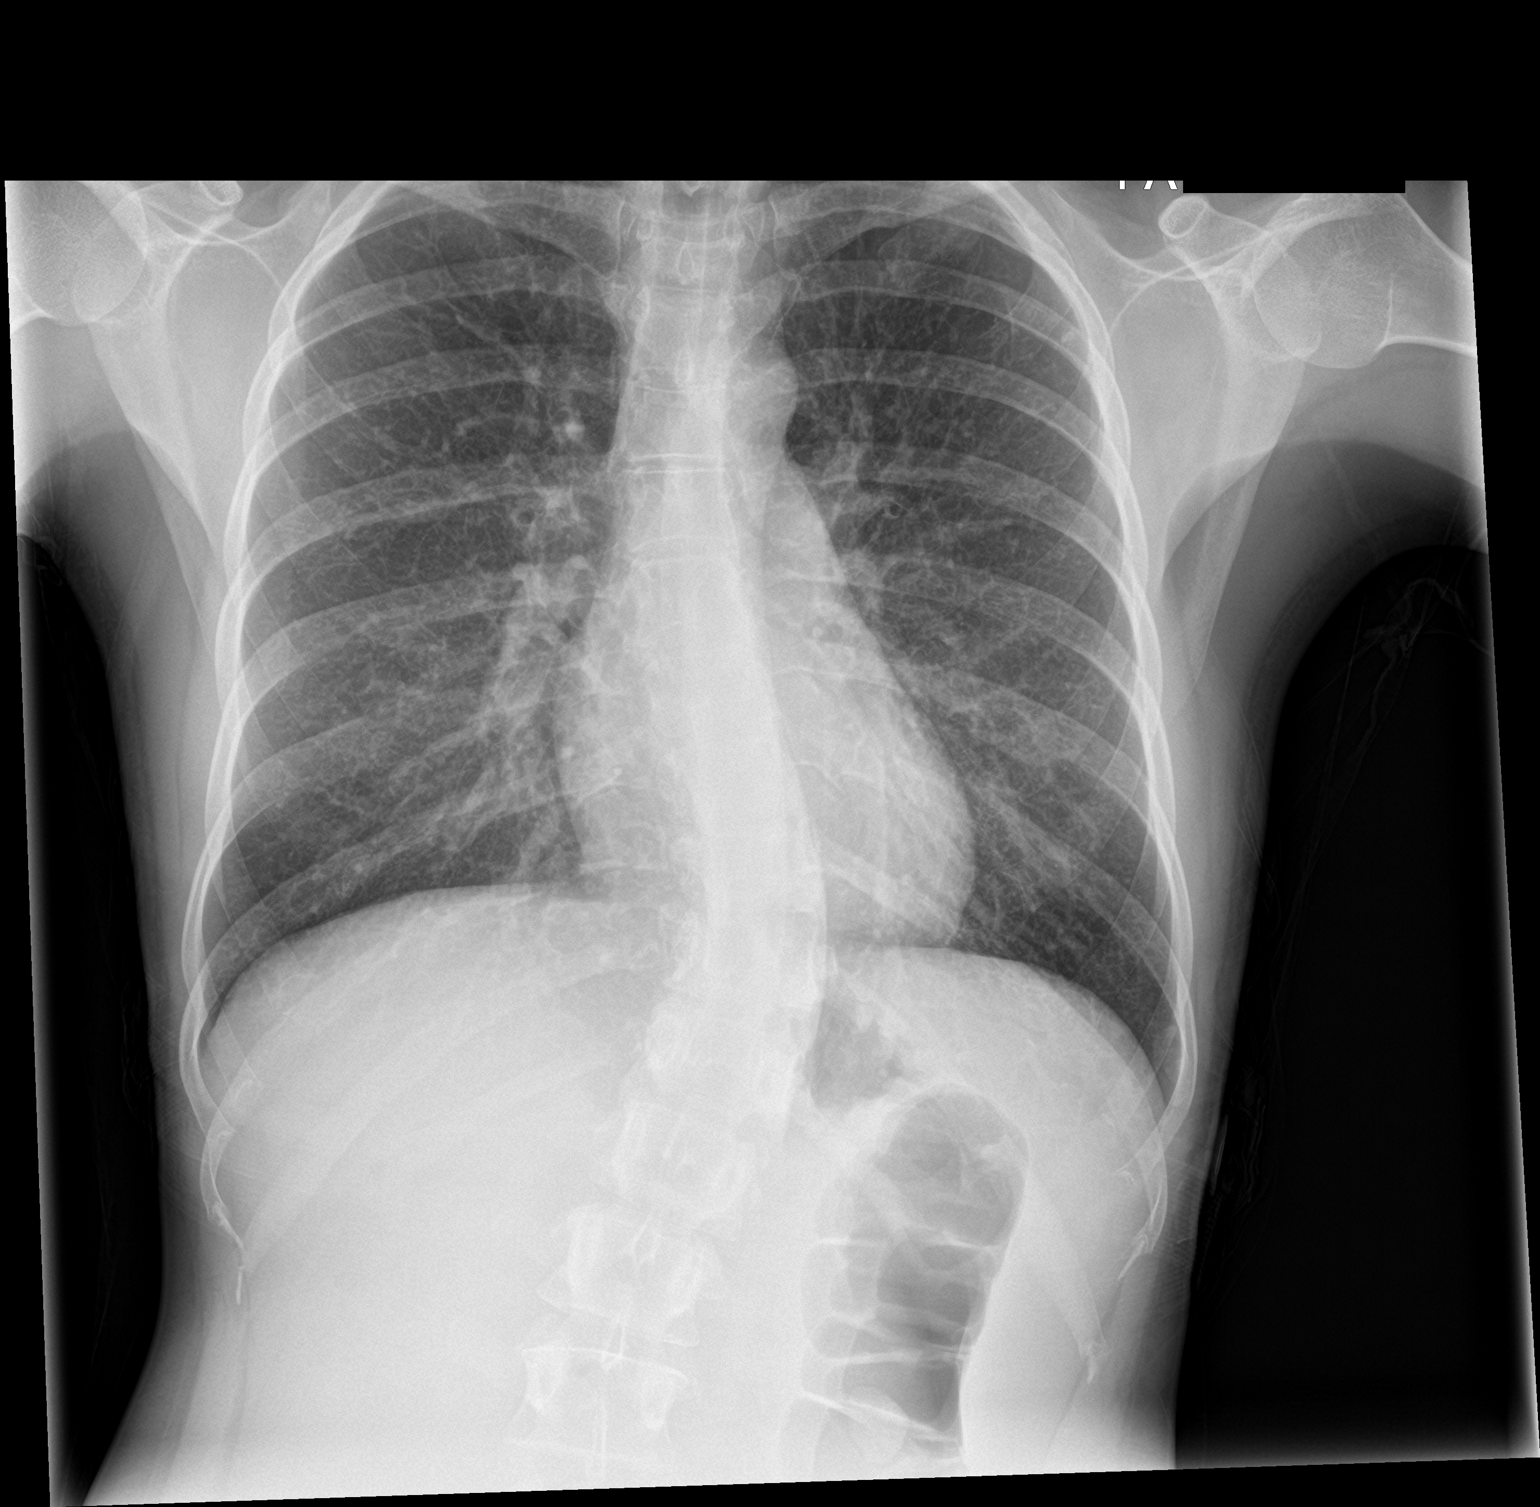

[chest lat]
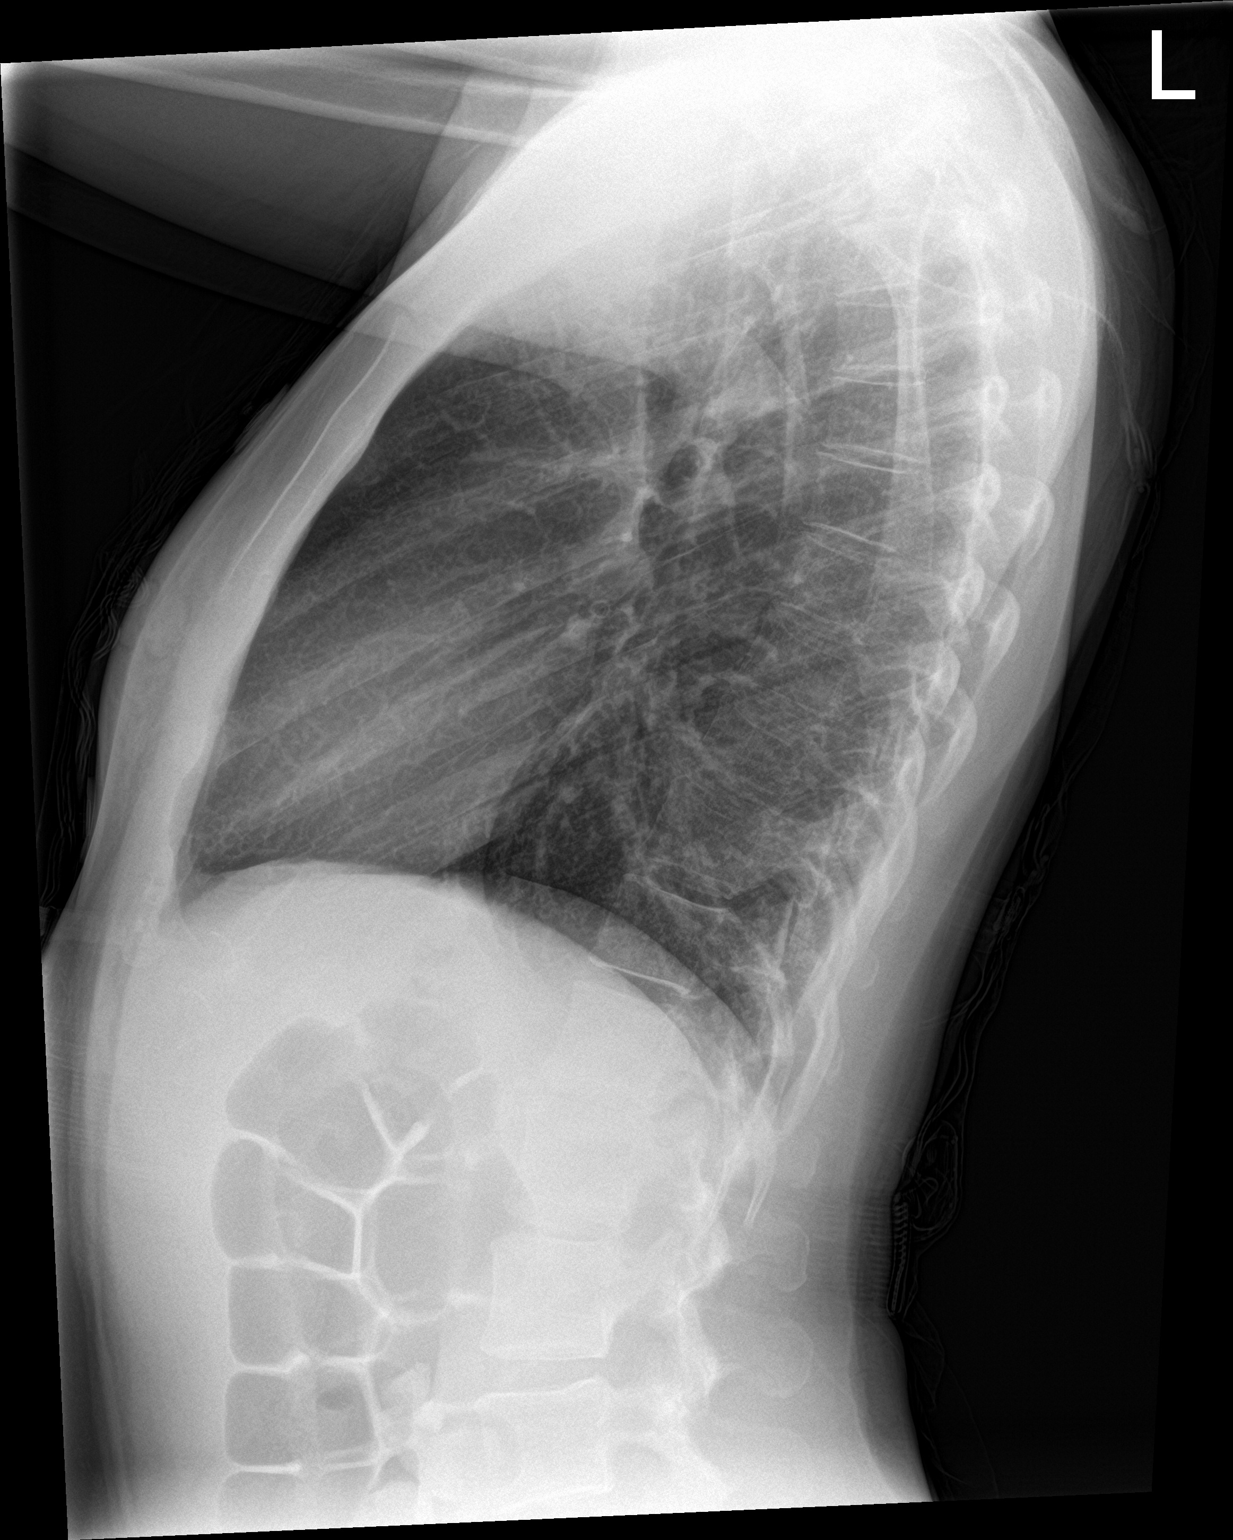

[2 of 2 positions shown; findings below may reference images not displayed]

FINDINGS: Interstitial prominence with patchy ill-defined density. No pleural
effusion. No pneumothorax. Cardiomediastinal contours are stable
with normal heart size. Levoscoliosis of the lower thoracic spine.
IMPRESSION: Interstitial opacities, which may reflect edema or atypical/viral
pneumonia.

## 2020-11-04 IMAGING — DX DG CHEST 1V PORT
1 series · 1 of 1 positions shown · non-contrast
Comparison: September 15, 2019.

CLINICAL DATA: Chest pain.

EXAM:
PORTABLE CHEST 1 VIEW

[chest]
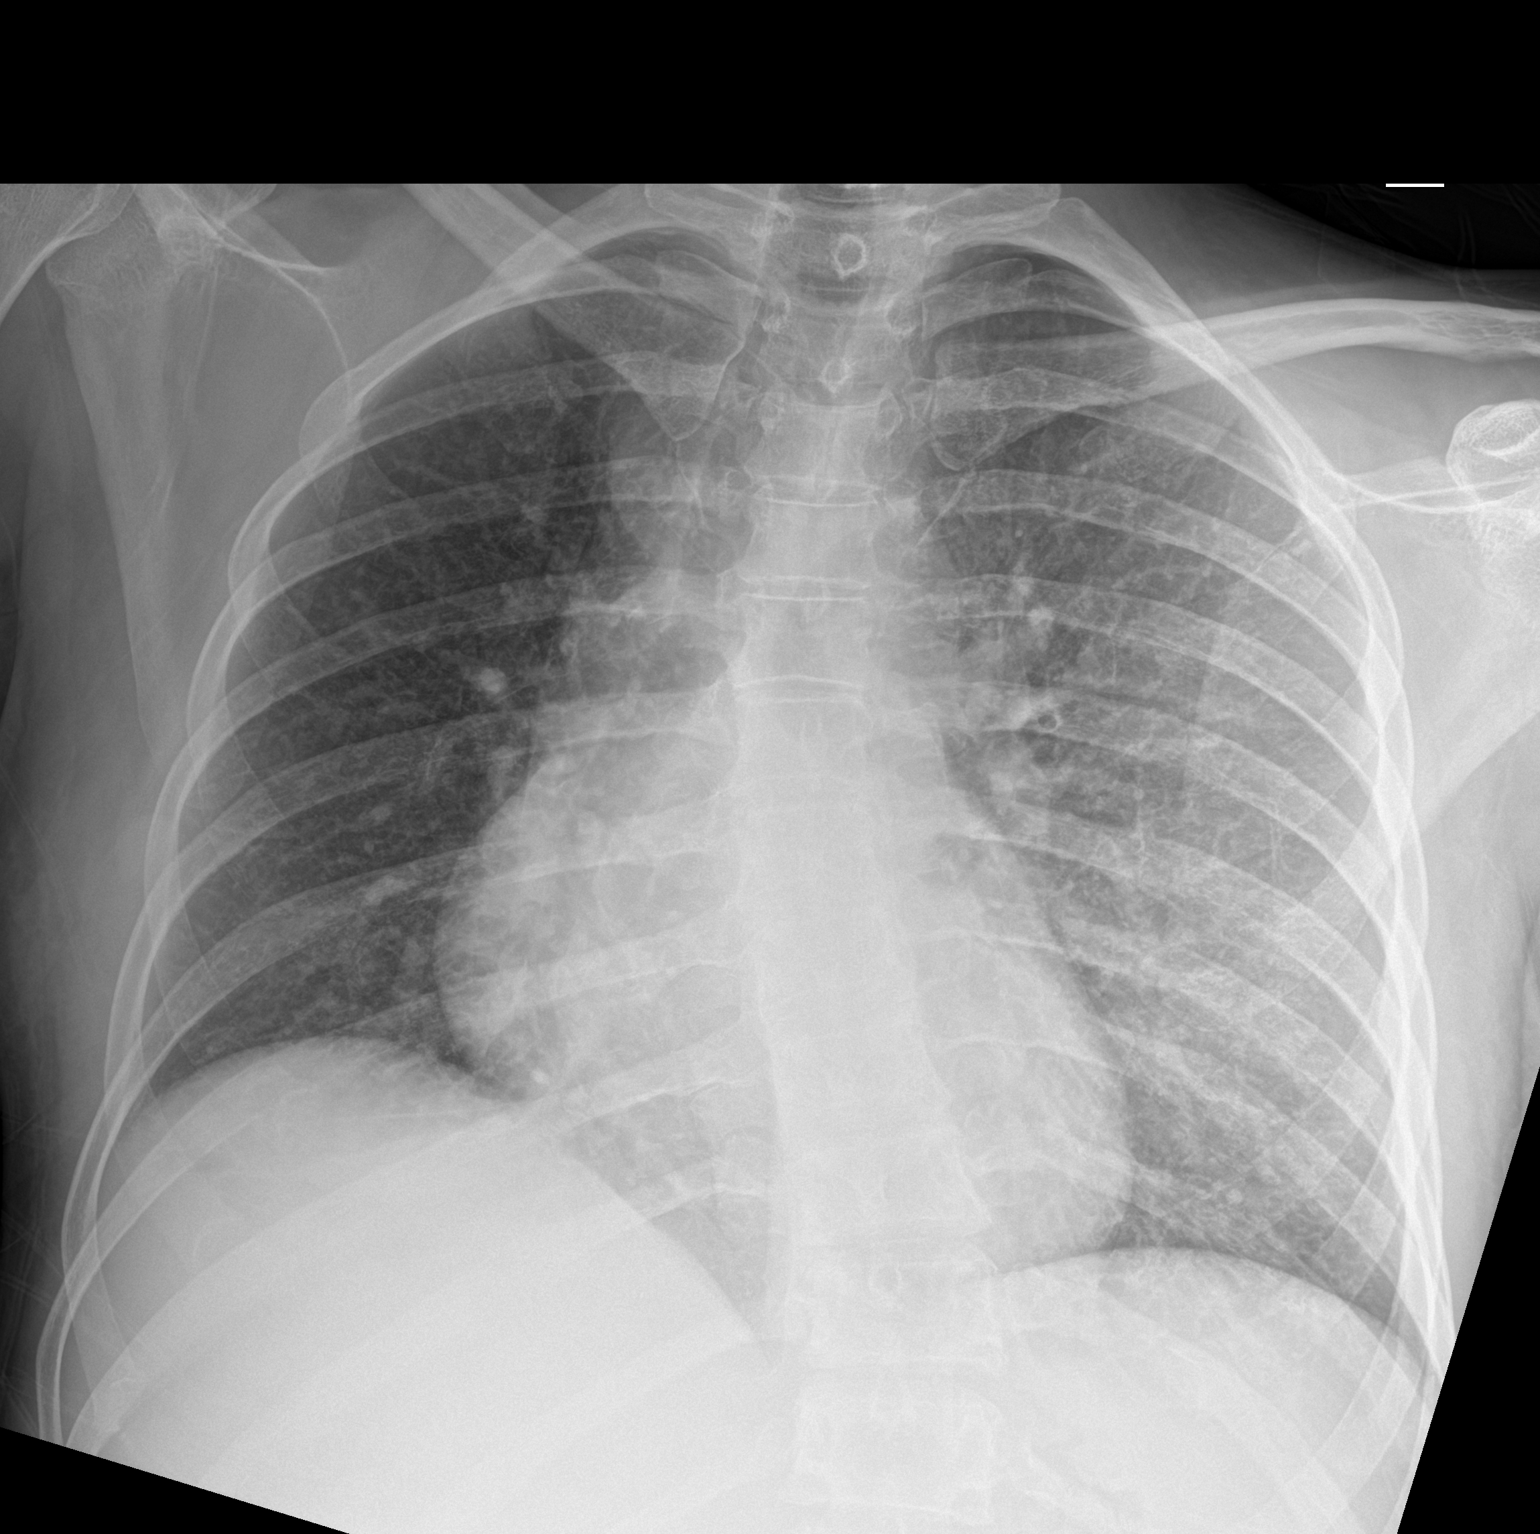

[1 of 1 positions shown; findings below may reference images not displayed]

FINDINGS: The heart size and mediastinal contours are within normal limits. No
pneumothorax or pleural effusion is noted. Right lung is clear. New
diffuse interstitial densities are noted throughout the left lung
which may represent atypical inflammation or possibly asymmetric
edema. The visualized skeletal structures are unremarkable.
IMPRESSION: New diffuse interstitial densities are noted throughout the left
lung which may represent atypical inflammation or possibly
asymmetric edema.

## 2020-12-04 ENCOUNTER — Inpatient Hospital Stay (HOSPITAL_COMMUNITY)
Admission: EM | Admit: 2020-12-04 | Discharge: 2020-12-27 | DRG: 871 | Discharge status: Against Medical Advice | Payer: Medicaid Other | Attending: Internal Medicine | Admitting: Internal Medicine

## 2020-12-04 ENCOUNTER — Other Ambulatory Visit: Payer: Self-pay

## 2020-12-04 ENCOUNTER — Encounter (HOSPITAL_COMMUNITY): Payer: Self-pay | Admitting: Emergency Medicine

## 2020-12-04 DIAGNOSIS — F419 Anxiety disorder, unspecified: Secondary | ICD-10-CM | POA: Diagnosis not present

## 2020-12-04 DIAGNOSIS — M419 Scoliosis, unspecified: Secondary | ICD-10-CM | POA: Diagnosis present

## 2020-12-04 DIAGNOSIS — D638 Anemia in other chronic diseases classified elsewhere: Secondary | ICD-10-CM | POA: Diagnosis present

## 2020-12-04 DIAGNOSIS — J040 Acute laryngitis: Secondary | ICD-10-CM | POA: Diagnosis not present

## 2020-12-04 DIAGNOSIS — Z818 Family history of other mental and behavioral disorders: Secondary | ICD-10-CM

## 2020-12-04 DIAGNOSIS — I742 Embolism and thrombosis of arteries of the upper extremities: Secondary | ICD-10-CM | POA: Diagnosis present

## 2020-12-04 DIAGNOSIS — A419 Sepsis, unspecified organism: Secondary | ICD-10-CM | POA: Diagnosis present

## 2020-12-04 DIAGNOSIS — Z833 Family history of diabetes mellitus: Secondary | ICD-10-CM

## 2020-12-04 DIAGNOSIS — E440 Moderate protein-calorie malnutrition: Secondary | ICD-10-CM | POA: Insufficient documentation

## 2020-12-04 DIAGNOSIS — I808 Phlebitis and thrombophlebitis of other sites: Secondary | ICD-10-CM | POA: Diagnosis present

## 2020-12-04 DIAGNOSIS — R079 Chest pain, unspecified: Secondary | ICD-10-CM

## 2020-12-04 DIAGNOSIS — Z23 Encounter for immunization: Secondary | ICD-10-CM

## 2020-12-04 DIAGNOSIS — L03114 Cellulitis of left upper limb: Secondary | ICD-10-CM | POA: Diagnosis present

## 2020-12-04 DIAGNOSIS — G9341 Metabolic encephalopathy: Secondary | ICD-10-CM

## 2020-12-04 DIAGNOSIS — J189 Pneumonia, unspecified organism: Secondary | ICD-10-CM | POA: Diagnosis present

## 2020-12-04 DIAGNOSIS — I081 Rheumatic disorders of both mitral and tricuspid valves: Secondary | ICD-10-CM | POA: Diagnosis present

## 2020-12-04 DIAGNOSIS — R1310 Dysphagia, unspecified: Secondary | ICD-10-CM | POA: Diagnosis not present

## 2020-12-04 DIAGNOSIS — B955 Unspecified streptococcus as the cause of diseases classified elsewhere: Secondary | ICD-10-CM | POA: Diagnosis present

## 2020-12-04 DIAGNOSIS — Z83438 Family history of other disorder of lipoprotein metabolism and other lipidemia: Secondary | ICD-10-CM

## 2020-12-04 DIAGNOSIS — R7881 Bacteremia: Secondary | ICD-10-CM | POA: Diagnosis present

## 2020-12-04 DIAGNOSIS — Z20822 Contact with and (suspected) exposure to covid-19: Secondary | ICD-10-CM | POA: Diagnosis present

## 2020-12-04 DIAGNOSIS — I82622 Acute embolism and thrombosis of deep veins of left upper extremity: Secondary | ICD-10-CM | POA: Diagnosis present

## 2020-12-04 DIAGNOSIS — I82A12 Acute embolism and thrombosis of left axillary vein: Secondary | ICD-10-CM | POA: Diagnosis present

## 2020-12-04 DIAGNOSIS — I809 Phlebitis and thrombophlebitis of unspecified site: Secondary | ICD-10-CM | POA: Diagnosis present

## 2020-12-04 DIAGNOSIS — F1120 Opioid dependence, uncomplicated: Secondary | ICD-10-CM | POA: Diagnosis present

## 2020-12-04 DIAGNOSIS — Z5329 Procedure and treatment not carried out because of patient's decision for other reasons: Secondary | ICD-10-CM | POA: Diagnosis present

## 2020-12-04 DIAGNOSIS — G4700 Insomnia, unspecified: Secondary | ICD-10-CM | POA: Diagnosis not present

## 2020-12-04 DIAGNOSIS — F17210 Nicotine dependence, cigarettes, uncomplicated: Secondary | ICD-10-CM | POA: Diagnosis present

## 2020-12-04 DIAGNOSIS — E861 Hypovolemia: Secondary | ICD-10-CM | POA: Diagnosis present

## 2020-12-04 DIAGNOSIS — F1190 Opioid use, unspecified, uncomplicated: Secondary | ICD-10-CM | POA: Diagnosis present

## 2020-12-04 DIAGNOSIS — Z765 Malingerer [conscious simulation]: Secondary | ICD-10-CM

## 2020-12-04 DIAGNOSIS — G40909 Epilepsy, unspecified, not intractable, without status epilepticus: Secondary | ICD-10-CM | POA: Diagnosis present

## 2020-12-04 DIAGNOSIS — B182 Chronic viral hepatitis C: Secondary | ICD-10-CM | POA: Diagnosis present

## 2020-12-04 DIAGNOSIS — F1420 Cocaine dependence, uncomplicated: Secondary | ICD-10-CM | POA: Diagnosis present

## 2020-12-04 DIAGNOSIS — I059 Rheumatic mitral valve disease, unspecified: Secondary | ICD-10-CM | POA: Diagnosis present

## 2020-12-04 DIAGNOSIS — Z681 Body mass index (BMI) 19 or less, adult: Secondary | ICD-10-CM

## 2020-12-04 DIAGNOSIS — K5900 Constipation, unspecified: Secondary | ICD-10-CM

## 2020-12-04 DIAGNOSIS — I2690 Septic pulmonary embolism without acute cor pulmonale: Secondary | ICD-10-CM | POA: Diagnosis present

## 2020-12-04 DIAGNOSIS — E875 Hyperkalemia: Secondary | ICD-10-CM | POA: Diagnosis not present

## 2020-12-04 DIAGNOSIS — I330 Acute and subacute infective endocarditis: Secondary | ICD-10-CM | POA: Diagnosis present

## 2020-12-04 DIAGNOSIS — Z8249 Family history of ischemic heart disease and other diseases of the circulatory system: Secondary | ICD-10-CM

## 2020-12-04 DIAGNOSIS — E871 Hypo-osmolality and hyponatremia: Secondary | ICD-10-CM | POA: Diagnosis present

## 2020-12-04 DIAGNOSIS — Z5900 Homelessness unspecified: Secondary | ICD-10-CM

## 2020-12-04 DIAGNOSIS — F1990 Other psychoactive substance use, unspecified, uncomplicated: Secondary | ICD-10-CM | POA: Diagnosis present

## 2020-12-04 DIAGNOSIS — J850 Gangrene and necrosis of lung: Secondary | ICD-10-CM | POA: Diagnosis present

## 2020-12-04 DIAGNOSIS — B1920 Unspecified viral hepatitis C without hepatic coma: Secondary | ICD-10-CM | POA: Diagnosis present

## 2020-12-04 DIAGNOSIS — A400 Sepsis due to streptococcus, group A: Principal | ICD-10-CM | POA: Diagnosis present

## 2020-12-04 DIAGNOSIS — I079 Rheumatic tricuspid valve disease, unspecified: Secondary | ICD-10-CM | POA: Diagnosis present

## 2020-12-04 DIAGNOSIS — F1910 Other psychoactive substance abuse, uncomplicated: Secondary | ICD-10-CM | POA: Diagnosis present

## 2020-12-04 LAB — CBC WITH DIFFERENTIAL/PLATELET
Abs Immature Granulocytes: 0.1 10*3/uL — ABNORMAL HIGH (ref 0.00–0.07)
Basophils Absolute: 0 10*3/uL (ref 0.0–0.1)
Basophils Relative: 0 %
Eosinophils Absolute: 0 10*3/uL (ref 0.0–0.5)
Eosinophils Relative: 0 %
HCT: 32.8 % — ABNORMAL LOW (ref 36.0–46.0)
Hemoglobin: 11 g/dL — ABNORMAL LOW (ref 12.0–15.0)
Immature Granulocytes: 1 %
Lymphocytes Relative: 14 %
Lymphs Abs: 2.1 10*3/uL (ref 0.7–4.0)
MCH: 28.1 pg (ref 26.0–34.0)
MCHC: 33.5 g/dL (ref 30.0–36.0)
MCV: 83.7 fL (ref 80.0–100.0)
Monocytes Absolute: 1.1 10*3/uL — ABNORMAL HIGH (ref 0.1–1.0)
Monocytes Relative: 7 %
Neutro Abs: 12.5 10*3/uL — ABNORMAL HIGH (ref 1.7–7.7)
Neutrophils Relative %: 78 %
Platelets: 181 10*3/uL (ref 150–400)
RBC: 3.92 MIL/uL (ref 3.87–5.11)
RDW: 14 % (ref 11.5–15.5)
WBC: 15.9 10*3/uL — ABNORMAL HIGH (ref 4.0–10.5)
nRBC: 0 % (ref 0.0–0.2)

## 2020-12-04 LAB — COMPREHENSIVE METABOLIC PANEL
ALT: 17 U/L (ref 0–44)
AST: 20 U/L (ref 15–41)
Albumin: 2.6 g/dL — ABNORMAL LOW (ref 3.5–5.0)
Alkaline Phosphatase: 44 U/L (ref 38–126)
Anion gap: 11 (ref 5–15)
BUN: 14 mg/dL (ref 6–20)
CO2: 25 mmol/L (ref 22–32)
Calcium: 8.3 mg/dL — ABNORMAL LOW (ref 8.9–10.3)
Chloride: 91 mmol/L — ABNORMAL LOW (ref 98–111)
Creatinine, Ser: 0.72 mg/dL (ref 0.44–1.00)
GFR, Estimated: >60 mL/min (ref >60)
Glucose, Bld: 115 mg/dL — ABNORMAL HIGH (ref 70–99)
Potassium: 4 mmol/L (ref 3.5–5.1)
Sodium: 127 mmol/L — ABNORMAL LOW (ref 135–145)
Total Bilirubin: 0.7 mg/dL (ref 0.3–1.2)
Total Protein: 6.7 g/dL (ref 6.5–8.1)

## 2020-12-04 LAB — TROPONIN I (HIGH SENSITIVITY): Troponin I (High Sensitivity): 4 ng/L (ref <18)

## 2020-12-04 LAB — PROTIME-INR
INR: 1.2 (ref 0.8–1.2)
Prothrombin Time: 15 seconds (ref 11.4–15.2)

## 2020-12-04 LAB — RESP PANEL BY RT-PCR (FLU A&B, COVID) ARPGX2
Influenza A by PCR: NEGATIVE
Influenza B by PCR: NEGATIVE
SARS Coronavirus 2 by RT PCR: NEGATIVE

## 2020-12-04 LAB — APTT: aPTT: 39 seconds — ABNORMAL HIGH (ref 24–36)

## 2020-12-04 LAB — LACTIC ACID, PLASMA: Lactic Acid, Venous: 1.1 mmol/L (ref 0.5–1.9)

## 2020-12-04 LAB — I-STAT BETA HCG BLOOD, ED (MC, WL, AP ONLY): I-stat hCG, quantitative: <5 m[IU]/mL (ref <5)

## 2020-12-04 MED ORDER — LACTATED RINGERS IV SOLN: INTRAVENOUS | Status: DC

## 2020-12-04 NOTE — ED Provider Notes (Signed)
Emergency Medicine Provider Triage Evaluation Note  Summer Cain , a 35 y.o. female  was evaluated in triage.  Pt complains of left arm swelling, redness, and pain. Further c/o left chest pain, sob, pleuritic pain, hemoptysis.  Review of Systems  Positive:  left arm swelling/pain, redness, left chest pain, sob, pleuritic pain, hemoptysis. Negative: vomiting  Physical Exam  BP (!) 112/97 (BP Location: Left Arm)   Pulse (!) 120   Temp 98.6 F (37 C) (Oral)   Resp 16   Ht 5\' 1"  (1.549 m)   Wt 45 kg   SpO2 100%   BMI 18.74 kg/m  Gen:   Awake, no distress   Resp:  Normal effort  MSK:   Moves extremities without difficulty  Other:  Erythema, induration and warmth to the LUE starting at the elbow and extending towards the axilla. There is an open wound noted at the elbow. No crepitus noted. Pt tachycardic.  Medical Decision Making  Medically screening exam initiated at 8:56 PM.  Appropriate orders placed.  Summer Cain was informed that the remainder of the evaluation will be completed by another provider, this initial triage assessment does not replace that evaluation, and the importance of remaining in the ED until their evaluation is complete.     , PA-C 12/04/20 2056    2057, MD 12/07/20 [phone]

## 2020-12-04 NOTE — ED Triage Notes (Addendum)
Patient reports left chest pain with mild SOB worse with deep inspiration and worsening left upper arm cellulitis for several days , denies drainage, history of IV drug use/homeless , denies fever or chills.

## 2020-12-05 ENCOUNTER — Emergency Department (HOSPITAL_COMMUNITY): Payer: Medicaid Other

## 2020-12-05 ENCOUNTER — Encounter (HOSPITAL_COMMUNITY): Payer: Self-pay | Admitting: Family Medicine

## 2020-12-05 ENCOUNTER — Inpatient Hospital Stay (HOSPITAL_COMMUNITY): Payer: Medicaid Other

## 2020-12-05 DIAGNOSIS — I38 Endocarditis, valve unspecified: Secondary | ICD-10-CM | POA: Diagnosis not present

## 2020-12-05 DIAGNOSIS — K5903 Drug induced constipation: Secondary | ICD-10-CM | POA: Diagnosis not present

## 2020-12-05 DIAGNOSIS — I82622 Acute embolism and thrombosis of deep veins of left upper extremity: Secondary | ICD-10-CM | POA: Diagnosis present

## 2020-12-05 DIAGNOSIS — E871 Hypo-osmolality and hyponatremia: Secondary | ICD-10-CM | POA: Diagnosis present

## 2020-12-05 DIAGNOSIS — I079 Rheumatic tricuspid valve disease, unspecified: Secondary | ICD-10-CM | POA: Diagnosis not present

## 2020-12-05 DIAGNOSIS — R079 Chest pain, unspecified: Secondary | ICD-10-CM | POA: Diagnosis not present

## 2020-12-05 DIAGNOSIS — F1420 Cocaine dependence, uncomplicated: Secondary | ICD-10-CM | POA: Diagnosis not present

## 2020-12-05 DIAGNOSIS — F1120 Opioid dependence, uncomplicated: Secondary | ICD-10-CM | POA: Diagnosis not present

## 2020-12-05 DIAGNOSIS — Z23 Encounter for immunization: Secondary | ICD-10-CM | POA: Diagnosis not present

## 2020-12-05 DIAGNOSIS — E861 Hypovolemia: Secondary | ICD-10-CM | POA: Diagnosis present

## 2020-12-05 DIAGNOSIS — Z681 Body mass index (BMI) 19 or less, adult: Secondary | ICD-10-CM | POA: Diagnosis not present

## 2020-12-05 DIAGNOSIS — I059 Rheumatic mitral valve disease, unspecified: Secondary | ICD-10-CM | POA: Diagnosis not present

## 2020-12-05 DIAGNOSIS — F1990 Other psychoactive substance use, unspecified, uncomplicated: Secondary | ICD-10-CM | POA: Diagnosis not present

## 2020-12-05 DIAGNOSIS — E440 Moderate protein-calorie malnutrition: Secondary | ICD-10-CM | POA: Diagnosis not present

## 2020-12-05 DIAGNOSIS — L03114 Cellulitis of left upper limb: Secondary | ICD-10-CM | POA: Insufficient documentation

## 2020-12-05 DIAGNOSIS — J850 Gangrene and necrosis of lung: Secondary | ICD-10-CM | POA: Diagnosis not present

## 2020-12-05 DIAGNOSIS — G40909 Epilepsy, unspecified, not intractable, without status epilepticus: Secondary | ICD-10-CM | POA: Diagnosis present

## 2020-12-05 DIAGNOSIS — B182 Chronic viral hepatitis C: Secondary | ICD-10-CM | POA: Diagnosis present

## 2020-12-05 DIAGNOSIS — M79602 Pain in left arm: Secondary | ICD-10-CM | POA: Diagnosis present

## 2020-12-05 DIAGNOSIS — J189 Pneumonia, unspecified organism: Secondary | ICD-10-CM | POA: Diagnosis not present

## 2020-12-05 DIAGNOSIS — I82A12 Acute embolism and thrombosis of left axillary vein: Secondary | ICD-10-CM | POA: Diagnosis present

## 2020-12-05 DIAGNOSIS — A400 Sepsis due to streptococcus, group A: Secondary | ICD-10-CM | POA: Diagnosis not present

## 2020-12-05 DIAGNOSIS — I809 Phlebitis and thrombophlebitis of unspecified site: Secondary | ICD-10-CM | POA: Diagnosis present

## 2020-12-05 DIAGNOSIS — G9341 Metabolic encephalopathy: Secondary | ICD-10-CM | POA: Diagnosis not present

## 2020-12-05 DIAGNOSIS — E875 Hyperkalemia: Secondary | ICD-10-CM | POA: Diagnosis not present

## 2020-12-05 DIAGNOSIS — Z20822 Contact with and (suspected) exposure to covid-19: Secondary | ICD-10-CM | POA: Diagnosis not present

## 2020-12-05 DIAGNOSIS — A419 Sepsis, unspecified organism: Secondary | ICD-10-CM

## 2020-12-05 DIAGNOSIS — I081 Rheumatic disorders of both mitral and tricuspid valves: Secondary | ICD-10-CM | POA: Diagnosis present

## 2020-12-05 DIAGNOSIS — I742 Embolism and thrombosis of arteries of the upper extremities: Secondary | ICD-10-CM | POA: Diagnosis not present

## 2020-12-05 DIAGNOSIS — I2690 Septic pulmonary embolism without acute cor pulmonale: Secondary | ICD-10-CM | POA: Diagnosis not present

## 2020-12-05 DIAGNOSIS — I330 Acute and subacute infective endocarditis: Secondary | ICD-10-CM | POA: Diagnosis not present

## 2020-12-05 DIAGNOSIS — D638 Anemia in other chronic diseases classified elsewhere: Secondary | ICD-10-CM | POA: Diagnosis present

## 2020-12-05 LAB — BLOOD CULTURE ID PANEL (REFLEXED) - BCID2

## 2020-12-05 LAB — URINALYSIS, ROUTINE W REFLEX MICROSCOPIC
Bacteria, UA: NONE SEEN
Bilirubin Urine: NEGATIVE
Glucose, UA: NEGATIVE mg/dL
Hgb urine dipstick: NEGATIVE
Ketones, ur: NEGATIVE mg/dL
Leukocytes,Ua: NEGATIVE
Nitrite: NEGATIVE
Protein, ur: 30 mg/dL — AB
Specific Gravity, Urine: 1.028 (ref 1.005–1.030)
pH: 6 (ref 5.0–8.0)

## 2020-12-05 LAB — ECHOCARDIOGRAM COMPLETE
Area-P 1/2: 4.8 cm2
Calc EF: 52.8 %
Height: 61 in
S' Lateral: 2.7 cm
Single Plane A2C EF: 51.2 %
Single Plane A4C EF: 55.2 %
Weight: 1587.31 oz

## 2020-12-05 LAB — TROPONIN I (HIGH SENSITIVITY): Troponin I (High Sensitivity): 3 ng/L (ref <18)

## 2020-12-05 LAB — HIV ANTIBODY (ROUTINE TESTING W REFLEX): HIV Screen 4th Generation wRfx: NONREACTIVE

## 2020-12-05 LAB — STREP PNEUMONIAE URINARY ANTIGEN: Strep Pneumo Urinary Antigen: NEGATIVE

## 2020-12-05 LAB — HEPARIN LEVEL (UNFRACTIONATED)
Heparin Unfractionated: <0.1 IU/mL — ABNORMAL LOW (ref 0.30–0.70)
Heparin Unfractionated: <0.1 IU/mL — ABNORMAL LOW (ref 0.30–0.70)

## 2020-12-05 LAB — PROCALCITONIN: Procalcitonin: 0.52 ng/mL

## 2020-12-05 MED ORDER — SODIUM CHLORIDE 0.9 % IV BOLUS
1000.0000 mL | Freq: Once | INTRAVENOUS | Status: AC
Start: 2020-12-05 — End: 2020-12-05
  Administered 2020-12-05: 1000 mL via INTRAVENOUS

## 2020-12-05 MED ORDER — SODIUM CHLORIDE 0.9 % IV BOLUS
1000.0000 mL | Freq: Once | INTRAVENOUS | Status: AC
  Administered 2020-12-05: 1000 mL via INTRAVENOUS

## 2020-12-05 MED ORDER — SODIUM CHLORIDE 0.9 % IV SOLN
2.0000 g | Freq: Once | INTRAVENOUS | Status: AC
  Administered 2020-12-05: 2 g via INTRAVENOUS
  Filled 2020-12-05: qty 20

## 2020-12-05 MED ORDER — SODIUM CHLORIDE 0.9 % IV SOLN
1000.0000 mL | INTRAVENOUS | Status: AC
  Administered 2020-12-06 (×2): 1000 mL via INTRAVENOUS

## 2020-12-05 MED ORDER — HYDROMORPHONE HCL 1 MG/ML IJ SOLN: 1.0000 mg | INTRAMUSCULAR | Status: DC | PRN

## 2020-12-05 MED ORDER — HEPARIN BOLUS VIA INFUSION
3000.0000 [IU] | Freq: Once | INTRAVENOUS | Status: AC
  Administered 2020-12-05: 3000 [IU] via INTRAVENOUS
  Filled 2020-12-05: qty 3000

## 2020-12-05 MED ORDER — ACETAMINOPHEN 650 MG RE SUPP: 650.0000 mg | Freq: Four times a day (QID) | RECTAL | Status: DC | PRN

## 2020-12-05 MED ORDER — PENICILLIN G POTASSIUM 20000000 UNITS IJ SOLR
12.0000 10*6.[IU] | Freq: Two times a day (BID) | INTRAVENOUS | Status: DC
  Administered 2020-12-05 – 2020-12-27 (×40): 12 10*6.[IU] via INTRAVENOUS
  Filled 2020-12-05 (×28): qty 12
  Filled 2020-12-05: qty 8
  Filled 2020-12-05 (×2): qty 12
  Filled 2020-12-05: qty 8
  Filled 2020-12-05 (×2): qty 12
  Filled 2020-12-05: qty 5
  Filled 2020-12-05: qty 12
  Filled 2020-12-05: qty 5
  Filled 2020-12-05 (×4): qty 12
  Filled 2020-12-05: qty 5
  Filled 2020-12-05 (×6): qty 12

## 2020-12-05 MED ORDER — IOHEXOL 350 MG/ML SOLN
80.0000 mL | Freq: Once | INTRAVENOUS | Status: AC | PRN
  Administered 2020-12-05: 80 mL via INTRAVENOUS

## 2020-12-05 MED ORDER — OXYCODONE HCL 5 MG PO TABS
5.0000 mg | ORAL_TABLET | ORAL | Status: DC | PRN
  Administered 2020-12-08 – 2020-12-09 (×4): 5 mg via ORAL
  Filled 2020-12-05 (×4): qty 1

## 2020-12-05 MED ORDER — ONDANSETRON HCL 4 MG PO TABS: 4.0000 mg | ORAL_TABLET | Freq: Four times a day (QID) | ORAL | Status: DC | PRN

## 2020-12-05 MED ORDER — SODIUM CHLORIDE 0.9 % IV BOLUS (SEPSIS)
1000.0000 mL | Freq: Once | INTRAVENOUS | Status: AC
  Administered 2020-12-05: 1000 mL via INTRAVENOUS

## 2020-12-05 MED ORDER — HEPARIN (PORCINE) 25000 UT/250ML-% IV SOLN
950.0000 [IU]/h | INTRAVENOUS | Status: DC
  Administered 2020-12-05: 950 [IU]/h via INTRAVENOUS
  Administered 2020-12-05: 800 [IU]/h via INTRAVENOUS
  Filled 2020-12-05 (×2): qty 250

## 2020-12-05 MED ORDER — VANCOMYCIN HCL IN DEXTROSE 750-5 MG/150ML-% IV SOLN
750.0000 mg | Freq: Two times a day (BID) | INTRAVENOUS | Status: DC
  Filled 2020-12-05: qty 150

## 2020-12-05 MED ORDER — HYDROMORPHONE HCL 2 MG PO TABS
4.0000 mg | ORAL_TABLET | ORAL | Status: DC | PRN
  Administered 2020-12-05 – 2020-12-06 (×3): 4 mg via ORAL
  Filled 2020-12-05 (×3): qty 2

## 2020-12-05 MED ORDER — SODIUM CHLORIDE 0.9 % IV SOLN
2.0000 g | Freq: Three times a day (TID) | INTRAVENOUS | Status: DC
  Administered 2020-12-05 (×2): 2 g via INTRAVENOUS
  Filled 2020-12-05 (×2): qty 2

## 2020-12-05 MED ORDER — ONDANSETRON HCL 4 MG/2ML IJ SOLN: 4.0000 mg | Freq: Four times a day (QID) | INTRAMUSCULAR | Status: DC | PRN

## 2020-12-05 MED ORDER — SODIUM CHLORIDE 0.9 % IV SOLN: 2.0000 g | INTRAVENOUS | Status: DC

## 2020-12-05 MED ORDER — BUPRENORPHINE HCL-NALOXONE HCL 8-2 MG SL SUBL
1.0000 | SUBLINGUAL_TABLET | Freq: Two times a day (BID) | SUBLINGUAL | Status: DC
  Filled 2020-12-05: qty 1

## 2020-12-05 MED ORDER — SODIUM CHLORIDE 0.9 % IV SOLN
1000.0000 mL | INTRAVENOUS | Status: DC
  Administered 2020-12-05: 1000 mL via INTRAVENOUS

## 2020-12-05 MED ORDER — ACETAMINOPHEN 325 MG PO TABS
650.0000 mg | ORAL_TABLET | Freq: Four times a day (QID) | ORAL | Status: DC | PRN
  Administered 2020-12-09 – 2020-12-15 (×2): 650 mg via ORAL
  Filled 2020-12-05 (×2): qty 2

## 2020-12-05 MED ORDER — VANCOMYCIN HCL 500 MG IV SOLR
500.0000 mg | Freq: Two times a day (BID) | INTRAVENOUS | Status: DC
  Filled 2020-12-05 (×2): qty 10

## 2020-12-05 MED ORDER — BUPRENORPHINE HCL-NALOXONE HCL 2-0.5 MG SL SUBL: 2.0000 | SUBLINGUAL_TABLET | SUBLINGUAL | Status: DC | PRN

## 2020-12-05 MED ORDER — SODIUM CHLORIDE 0.9 % IV SOLN
1000.0000 mL | INTRAVENOUS | Status: DC
  Administered 2020-12-05 (×3): 1000 mL via INTRAVENOUS

## 2020-12-05 MED ORDER — VANCOMYCIN HCL IN DEXTROSE 1-5 GM/200ML-% IV SOLN
1000.0000 mg | Freq: Once | INTRAVENOUS | Status: AC
  Administered 2020-12-05: 1000 mg via INTRAVENOUS
  Filled 2020-12-05: qty 200

## 2020-12-05 MED ORDER — KETOROLAC TROMETHAMINE 15 MG/ML IJ SOLN
15.0000 mg | Freq: Four times a day (QID) | INTRAMUSCULAR | Status: DC
  Administered 2020-12-05 – 2020-12-08 (×13): 15 mg via INTRAVENOUS
  Filled 2020-12-05 (×14): qty 1

## 2020-12-05 MED ORDER — PANTOPRAZOLE SODIUM 40 MG PO TBEC
40.0000 mg | DELAYED_RELEASE_TABLET | Freq: Every day | ORAL | Status: DC
  Administered 2020-12-05 – 2020-12-09 (×5): 40 mg via ORAL
  Filled 2020-12-05 (×5): qty 1

## 2020-12-05 MED ORDER — CLINDAMYCIN PHOSPHATE 600 MG/50ML IV SOLN
600.0000 mg | Freq: Three times a day (TID) | INTRAVENOUS | Status: DC
  Administered 2020-12-05 – 2020-12-08 (×9): 600 mg via INTRAVENOUS
  Filled 2020-12-05 (×12): qty 50

## 2020-12-05 MED ORDER — MORPHINE SULFATE (PF) 4 MG/ML IV SOLN
6.0000 mg | Freq: Once | INTRAVENOUS | Status: AC
  Administered 2020-12-05: 6 mg via INTRAVENOUS
  Filled 2020-12-05: qty 2

## 2020-12-05 MED ORDER — LOPERAMIDE HCL 2 MG PO CAPS
2.0000 mg | ORAL_CAPSULE | ORAL | Status: DC | PRN
  Administered 2020-12-05: 2 mg via ORAL
  Filled 2020-12-05: qty 1
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
## 2020-12-05 NOTE — ED Notes (Signed)
I found the heparin and thre nss oiff the pump  the pt denies doing it but she is the only one that could have stopped it  no idea how long the heparin was off  restarted

## 2020-12-05 NOTE — Progress Notes (Signed)
    [facility] has been requested to perform a transesophageal echocardiogram on 11/21 with Dr. Bjorn Pippin for bacteremia.  After careful review of history and examination, the risks and benefits of transesophageal echocardiogram have been explained including risks of esophageal damage, perforation (1:10,000 risk), bleeding, pharyngeal hematoma as well as other potential complications associated with conscious sedation including aspiration, arrhythmia, respiratory failure and death. Alternatives to treatment were discussed, questions were answered. Patient is willing to proceed. Note Na+ 127, Hgb 11 and platelet 181. Blood pressure are soft in the 80s systolic, lowest documented 77/44. Will need to re-evaluate BPs prior to procedure on Monday.   Laverda Page, NP-C 12/05/2020 3:53 PM

## 2020-12-05 NOTE — Progress Notes (Signed)
ANTICOAGULATION CONSULT NOTE - Initial Consult  Pharmacy Consult for Heparin Indication: R/O DVT  Allergies  Allergen Reactions   Latex Swelling    Patient Measurements: Height: 5\' 1"  (154.9 cm) Weight: 45 kg (99 lb 3.3 oz) IBW/kg (Calculated) : 47.8  Vital Signs: Temp: 100.2 F (37.9 C) (11/18 0730) Temp Source: Oral (11/18 0730) BP: 79/59 (11/18 1145) Pulse Rate: 81 (11/18 1145)  Labs: Recent Labs    12/04/20 2108 12/05/20 0130 12/05/20 0927  HGB 11.0*  --   --   HCT 32.8*  --   --   PLT 181  --   --   APTT 39*  --   --   LABPROT 15.0  --   --   INR 1.2  --   --   HEPARINUNFRC  --   --  <0.10*  CREATININE 0.72  --   --   TROPONINIHS 4 3  --      Estimated Creatinine Clearance: 71.1 mL/min (by C-G formula based on SCr of 0.72 mg/dL).   Medical History: Past Medical History:  Diagnosis Date   Pregnant    Scoliosis     Medications:  No current facility-administered medications on file prior to encounter.   Current Outpatient Medications on File Prior to Encounter  Medication Sig Dispense Refill   QUEtiapine (SEROQUEL) 200 MG tablet Take 200 mg by mouth at bedtime.     traZODone (DESYREL) 100 MG tablet Take 50-100 mg by mouth See admin instructions. 100mg  at bedtime, may take an additional 50mg  as needed for sleep     [DISCONTINUED] QUEtiapine (SEROQUEL) 50 MG tablet Take 1 tablet (50 mg total) by mouth at bedtime. 30 tablet 1   [DISCONTINUED] traZODone (DESYREL) 100 MG tablet Take 1 tablet (100 mg total) by mouth at bedtime. 30 tablet 1     Assessment: 35 y.o. female with LUE swelling, DVT of upper extremity on CT, initial heparin level undetectable on 800 units/hr, no infusion issues  Goal of Therapy:  Heparin level 0.3-0.7 units/ml Monitor platelets by anticoagulation protocol: Yes   Plan:  Re-bolus 3000 units IV x 1, and gtt rate increase to 950 units/hr F/u 6 hour heparin level  , PharmD Clinical Pharmacist ED Pharmacist Phone  # [phone] 12/05/2020 12:35 PM

## 2020-12-05 NOTE — ED Notes (Signed)
Patient transported to CT 

## 2020-12-05 NOTE — Progress Notes (Signed)
Visited with patient. Staff presence taking vital Pt.  Alert and talking. Pt said her family was aware that she was here in Hospital and that she will most likely be admitted.  Pt had not immediate spiritual care needs.  Pt. Said once she was in her room if further Chaplain services were needed she would have nurse page chaplain.  Provided emotional support, listening and presence. Will follow as needed.  Summer Cain, [place], [facility], Pager [phone]

## 2020-12-05 NOTE — Progress Notes (Signed)
ANTICOAGULATION CONSULT NOTE - Initial Consult  Pharmacy Consult for Heparin Indication: R/O DVT  Allergies  Allergen Reactions   Latex Swelling    Patient Measurements: Height: 5\' 1"  (154.9 cm) Weight: 45 kg (99 lb 3.3 oz) IBW/kg (Calculated) : 47.8  Vital Signs: Temp: 98.6 F (37 C) (11/17 2047) Temp Source: Oral (11/17 2047) BP: 102/91 (11/18 0200) Pulse Rate: 79 (11/18 0200)  Labs: Recent Labs    12/04/20 2108 12/05/20 0130  HGB 11.0*  --   HCT 32.8*  --   PLT 181  --   APTT 39*  --   LABPROT 15.0  --   INR 1.2  --   CREATININE 0.72  --   TROPONINIHS 4 3    Estimated Creatinine Clearance: 71.1 mL/min (by C-G formula based on SCr of 0.72 mg/dL).   Medical History: Past Medical History:  Diagnosis Date   Pregnant    Scoliosis     Medications:  No current facility-administered medications on file prior to encounter.   Current Outpatient Medications on File Prior to Encounter  Medication Sig Dispense Refill   QUEtiapine (SEROQUEL) 50 MG tablet Take 1 tablet (50 mg total) by mouth at bedtime. 30 tablet 1   traZODone (DESYREL) 100 MG tablet Take 1 tablet (100 mg total) by mouth at bedtime. 30 tablet 1     Assessment: 35 y.o. female with LUE swelling, possible VTE, for heparin  Goal of Therapy:  Heparin level 0.3-0.7 units/ml Monitor platelets by anticoagulation protocol: Yes   Plan:  Heparin 3000 units IV bolus, then start heparin 800 units/hr Check heparin level in 6 hours.   32 12/05/2020,3:42 AM

## 2020-12-05 NOTE — Progress Notes (Addendum)
[facility] PROGRESS NOTE    Progress Note  Tanganyika Reichenbach  [id number] DOB: Mar 10, 1985 DOA: 12/04/2020 PCP: Patient, No Pcp Per (Inactive)     Brief Narrative:   Caoilinn Vertucci is an 35 y.o. female past medical history of IV drug abuse, opiate dependence hepatitis C with mitral valve vegetation on TTE July 2021, her cultures at that time were positive for Pseudomonas, MRSA and Serratia but unfortunately she left AMA comes into the ED for 1 week of left arm pain associated with swelling and redness she also has a lump on her chest wall and swollen lymph nodes.  She also relates scant hemoptysis and shortness of breath left upper extremity CT showed a DVT no abscess CT of the chest showed no PE or pneumonia started empirically on Vanco and Rocephin in the ED blood cultures were obtained   Assessment/Plan:   Acute deep vein thrombosis (DVT) of left upper extremity ([place]): Provoked likely due to IV drug abuse was started on heparin. Upper extremity Doppler to rule out DVT has been sent, he was started on IV heparin and antibiotics see below for the details.  Sepsis likely due to left upper extremity cellulitis: With a white count and a left shift, blood cultures were obtained. Was started empirically on Vanco and cefepime due to her history of Pseudomonas. 2D echo has been sent. Concern about endocarditis.  Cavitary lung lesion/pulmonary embolism: Blood cultures were obtained, started empirically on Vanco and cefepime. 2D echo has been order.  Hyponatremia probably hypovolemic: Was started on insulin infusion recheck a basic metabolic panel in the morning.  Hepatitis C: Will need follow-up as an outpatient.  IV drug abuse: Started on bupropion iron short acting opiates were added as well as Toradol.  DVT prophylaxis: IV heparin Family Communication:none Status is: Inpatient  Remains inpatient appropriate because: Acute DVT with upper extremity cellulitis concern for  endocarditis    Code Status:     Code Status Orders  (From admission, onward)           Start     Ordered   12/05/20 0620  Full code  Continuous        12/05/20 0621           Code Status History     Date Active Date Inactive Code Status Order ID Comments User Context   09/15/2019 1509 09/20/2019 2224 Full Code [id number]  Delice Bison, DO ED   08/11/2019 0147 08/13/2019 1529 Full Code [id number]  Mansy, Arvella Merles, MD ED   04/01/2019 1108 04/04/2019 0303 Full Code [id number]  Jean Rosenthal, MD ED   05/22/2018 0203 05/24/2018 1759 Full Code [id number]  Donnamae Jude, MD Inpatient   04/08/2017 0942 04/10/2017 2153 Full Code [id number]  Christin Fudge, [place] Inpatient   04/08/2017 0729 04/08/2017 [place] Full Code [id number]  Christin Fudge, [facility] Inpatient   11/07/2015 0120 11/08/2015 1723 Full Code [id number]  Darlina Rumpf Inpatient   11/06/2015 1033 11/07/2015 0120 Full Code [id number]  Myrtis Ser, CNM Inpatient         IV Access:   Peripheral IV   Procedures and diagnostic studies:   DG Chest 1 View  Result Date: 12/05/2020 CLINICAL DATA:  Chest pain. EXAM: CHEST  1 VIEW COMPARISON:  Chest radiograph dated 09/19/2019. FINDINGS: Diffuse interstitial nodularity may represent mild edema. Atypical infection is not excluded clinical correlation is recommended no focal consolidation, pleural effusion or pneumothorax. The cardiac silhouette is within limits. Scoliosis. No acute osseous pathology.  IMPRESSION: Diffuse interstitial nodularity may represent mild edema. Atypical infection is not excluded. No focal consolidation. Electronically Signed   By: Anner Crete M.D.   On: 12/05/2020 00:19   CT Angio Chest PE W and/or Wo Contrast  Result Date: 12/05/2020 CLINICAL DATA:  Left chest pain, dyspnea.  PE suspected, high prob. EXAM: CT ANGIOGRAPHY CHEST WITH CONTRAST TECHNIQUE: Multidetector CT imaging of the chest was performed using the standard protocol  during bolus administration of intravenous contrast. Multiplanar CT image reconstructions and MIPs were obtained to evaluate the vascular anatomy. CONTRAST:  15mL OMNIPAQUE IOHEXOL 350 MG/ML SOLN COMPARISON:  None. FINDINGS: Cardiovascular: Adequate opacification of the pulmonary arterial tree. No intraluminal filling defect identified to suggest acute pulmonary embolism. Central pulmonary arteries are of normal caliber. No significant coronary artery calcification. Cardiac size within normal limits. No pericardial effusion. No significant atherosclerotic calcification within the thoracic aorta. No aortic aneurysm. Mediastinum/Nodes: There is left axillary and subpectoral adenopathy. No mediastinal or hilar adenopathy identified. Thyroid unremarkable. Esophagus unremarkable. Lungs/Pleura: There is a cavitary, inflammatory appearing lesion within the inferior lingula which may represent the sequela of septic embolization or necrotizing pneumonia. An impacted airway is seen communicating with this region. A necrotic mass is considered less likely. The lungs are otherwise clear. No pneumothorax or pleural effusion. Upper Abdomen: No acute abnormality. Musculoskeletal: No chest wall abnormality. No acute or significant osseous findings. Subcutaneous edema is noted within the visualized left upper extremity with inflammatory stranding noted within the axilla. Review of the MIP images confirms the above findings. IMPRESSION: Shotty left axillary and subpectoral adenopathy, possibly reactive in nature and related to an inflammatory process within the left upper extremity and axilla. No pulmonary embolism. Cavitary lesion within the basilar lingula in keeping with the sequela of septic embolization or necrotizing pneumonia. Follow-up evaluation to document resolution is recommended following conservative therapy. Electronically Signed   By: Fidela Salisbury M.D.   On: 12/05/2020 03:27   CT Extrem Up Entire Arm L WO/W  CM  Result Date: 12/05/2020 CLINICAL DATA:  Concern for cellulitis of the left upper extremity. EXAM: CT OF THE UPPER LEFT EXTREMITY WITHOUT AND WITH CONTRAST TECHNIQUE: Multidetector CT imaging of the upper left extremity was performed following the standard protocol before and during bolus administration of intravenous contrast. COMPARISON:  None. CONTRAST:  63mL OMNIPAQUE IOHEXOL 350 MG/ML SOLN FINDINGS: Bones/Joint/Cartilage There is no acute fracture or dislocation. The bones are well mineralized. No arthritic changes. No joint effusion. Ligaments Suboptimally assessed by CT. Muscles and Tendons There is intramuscular edema. Soft tissues There is diffuse subcutaneous edema of the left upper extremity consistent with cellulitis. No drainable fluid collection/abscess. There is a long segment thrombosed vein involving the medial left upper extremity extending from the distal forearm into the axilla consistent with long segment DVT. This appears to involve the brachial vein extending into the axillary vein. No drainable fluid collection/abscess. IMPRESSION: 1. Long segment venous thrombus extending into the left axillary vein at the junction of the subclavian vein consistent with DVT. Associated inflammatory changes indicative of thrombophlebitis. Further evaluation with dedicated duplex ultrasound recommended. 2. No drainable fluid collection/abscess. These results were called by telephone at the time of interpretation on 12/05/2020 at 3:18 am to provider [facility] , who verbally acknowledged these results. Electronically Signed   By: Anner Crete M.D.   On: 12/05/2020 03:23     Medical Consultants:   None.   Subjective:    Lovetta Lanphier screaming complaining of pain along her  left arm and left chest wall  Objective:    Vitals:   12/05/20 0245 12/05/20 0405 12/05/20 0500 12/05/20 0600  BP: 102/71 (!) 98/56 (!) 92/59 (!) 95/53  Pulse:  78 96 87  Resp: 14 19 19  (!) 22  Temp:       TempSrc:      SpO2: 95% 94% 95% 91%  Weight:      Height:       SpO2: 91 %   Intake/Output Summary (Last 24 hours) at 12/05/2020 0655 Last data filed at 12/05/2020 0535 Gross per 24 hour  Intake 1366.64 ml  Output --  Net 1366.64 ml   Filed Weights   12/04/20 2049  Weight: 45 kg    Exam: General exam: In no acute distress. Respiratory system: Good air movement and clear to auscultation. Cardiovascular system: S1 & S2 heard, RRR. No JVD. Gastrointestinal system: Abdomen is nondistended, soft and nontender.  Extremities: No pedal edema. Skin: Left upper extremity medial no abscess or fluctuation with significant erythema and swelling. Psychiatry: Judgement and insight appear normal. Mood & affect appropriate.    Data Reviewed:    Labs: Basic Metabolic Panel: Recent Labs  Lab 12/04/20 2108  NA 127*  K 4.0  CL 91*  CO2 25  GLUCOSE 115*  BUN 14  CREATININE 0.72  CALCIUM 8.3*   GFR Estimated Creatinine Clearance: 71.1 mL/min (by C-G formula based on SCr of 0.72 mg/dL). Liver Function Tests: Recent Labs  Lab 12/04/20 2108  AST 20  ALT 17  ALKPHOS 44  BILITOT 0.7  PROT 6.7  ALBUMIN 2.6*   No results for input(s): LIPASE, AMYLASE in the last 168 hours. No results for input(s): AMMONIA in the last 168 hours. Coagulation profile Recent Labs  Lab 12/04/20 2108  INR 1.2   COVID-19 Labs  No results for input(s): DDIMER, FERRITIN, LDH, CRP in the last 72 hours.  Lab Results  Component Value Date   SARSCOV2NAA NEGATIVE 12/04/2020   SARSCOV2NAA NEGATIVE 09/15/2019   SARSCOV2NAA NEGATIVE 08/10/2019   SARSCOV2NAA NEGATIVE 04/03/2019    CBC: Recent Labs  Lab 12/04/20 2108  WBC 15.9*  NEUTROABS 12.5*  HGB 11.0*  HCT 32.8*  MCV 83.7  PLT 181   Cardiac Enzymes: No results for input(s): CKTOTAL, CKMB, CKMBINDEX, TROPONINI in the last 168 hours. BNP (last 3 results) No results for input(s): PROBNP in the last 8760 hours. CBG: No results for  input(s): GLUCAP in the last 168 hours. D-Dimer: No results for input(s): DDIMER in the last 72 hours. Hgb A1c: No results for input(s): HGBA1C in the last 72 hours. Lipid Profile: No results for input(s): CHOL, HDL, LDLCALC, TRIG, CHOLHDL, LDLDIRECT in the last 72 hours. Thyroid function studies: No results for input(s): TSH, T4TOTAL, T3FREE, THYROIDAB in the last 72 hours.  Invalid input(s): FREET3 Anemia work up: No results for input(s): VITAMINB12, FOLATE, FERRITIN, TIBC, IRON, RETICCTPCT in the last 72 hours. Sepsis Labs: Recent Labs  Lab 12/04/20 2108  WBC 15.9*  LATICACIDVEN 1.1   Microbiology Recent Results (from the past 240 hour(s))  Resp Panel by RT-PCR (Flu A&B, Covid) Peripheral     Status: None   Collection Time: 12/04/20  8:52 PM   Specimen: Peripheral; Nasopharyngeal(NP) swabs in vial transport medium  Result Value Ref Range Status   SARS Coronavirus 2 by RT PCR NEGATIVE NEGATIVE Final    Comment: (NOTE) SARS-CoV-2 target nucleic acids are NOT DETECTED.  The SARS-CoV-2 RNA is generally detectable in upper respiratory specimens during  the acute phase of infection. The lowest concentration of SARS-CoV-2 viral copies this assay can detect is 138 copies/mL. A negative result does not preclude SARS-Cov-2 infection and should not be used as the sole basis for treatment or other patient management decisions. A negative result may occur with  improper specimen collection/handling, submission of specimen other than nasopharyngeal swab, presence of viral mutation(s) within the areas targeted by this assay, and inadequate number of viral copies(<138 copies/mL). A negative result must be combined with clinical observations, patient history, and epidemiological information. The expected result is Negative.  Fact Sheet for Patients:  BloggerCourse.com  Fact Sheet for Healthcare Providers:  SeriousBroker.it  This test  is no t yet approved or cleared by the [place] FDA and  has been authorized for detection and/or diagnosis of SARS-CoV-2 by FDA under an Emergency Use Authorization (EUA). This EUA will remain  in effect (meaning this test can be used) for the duration of the COVID-19 declaration under Section 564(b)(1) of the Act, 21 U.S.C.section 360bbb-3(b)(1), unless the authorization is terminated  or revoked sooner.       Influenza A by PCR NEGATIVE NEGATIVE Final   Influenza B by PCR NEGATIVE NEGATIVE Final    Comment: (NOTE) The Xpert Xpress SARS-CoV-2/FLU/RSV plus assay is intended as an aid in the diagnosis of influenza from Nasopharyngeal swab specimens and should not be used as a sole basis for treatment. Nasal washings and aspirates are unacceptable for Xpert Xpress SARS-CoV-2/FLU/RSV testing.  Fact Sheet for Patients: BloggerCourse.com  Fact Sheet for Healthcare Providers: SeriousBroker.it  This test is not yet approved or cleared by the [place] FDA and has been authorized for detection and/or diagnosis of SARS-CoV-2 by FDA under an Emergency Use Authorization (EUA). This EUA will remain in effect (meaning this test can be used) for the duration of the COVID-19 declaration under Section 564(b)(1) of the Act, 21 U.S.C. section 360bbb-3(b)(1), unless the authorization is terminated or revoked.  Performed at [facility], [address] [place], [place] [address]      Medications:    [START ON 12/06/2020] buprenorphine-naloxone  1 tablet Sublingual BID   ketorolac  15 mg Intravenous Q6H   pantoprazole  40 mg Oral Daily   Continuous Infusions:  sodium chloride     ceFEPime (MAXIPIME) IV     heparin 800 Units/hr (12/05/20 0405)   vancomycin 1,000 mg (12/05/20 2355)   vancomycin        LOS: 0 days   Marinda Elk  [facility]  12/05/2020, 6:55 AM

## 2020-12-05 NOTE — ED Provider Notes (Signed)
[facility] EMERGENCY DEPARTMENT Provider Note  CSN: [id number] Arrival date & time: 12/04/20 1952  Chief Complaint(s) Chest Pain / Cellulitis   HPI Summer Cain is a 35 y.o. female with a past medical history listed below including IV drug use who presents to the emergency department with 1 week of left arm pain and swelling associated with redness.  Warm to the touch.  Painful to palpation.  Pain is throbbing and aching in nature.  This is the area that she typically injects through.  Reports that she was clean for several months but relapsed a month ago.  Reports that she has to get Narcan and believes the people who Narcan her, used dirty needles.  Patient is still using but is injecting in the right hand or does intranasal administration.   Reports that she developed chest pain and mild shortness of breath over the past several days which is actually what prompted her to come in.  Pain is worse with deep inspiration and palpation of the left chest.  Pain is nonradiating.  Reports that she has lumps on her chest wall.  Also has swollen lymph nodes.  No known fevers or chills.  She endorses diffuse myalgia.   No other physical complaints.  HPI  Past Medical History Past Medical History:  Diagnosis Date   Pregnant    Scoliosis    Patient Active Problem List   Diagnosis Date Noted   Thrombophlebitis 12/05/2020   Acute deep vein thrombosis (DVT) of left upper extremity ([place]) 12/05/2020   Hyponatremia 12/05/2020   Pneumonia 12/05/2020   Opioid use disorder 09/16/2019   Soft tissue infection 09/16/2019   Chest pain 09/15/2019   Sepsis ([place]) 08/11/2019   Cellulitis of right lower extremity 08/10/2019   Hepatitis 04/01/2019   IVDU (intravenous drug user) 04/01/2019   Bacteriuria with pyuria 04/01/2019   Hepatitis C 04/01/2019   Alcohol use disorder, severe, dependence ([place]) 06/06/2018   Bipolar affective disorder, currently depressed, moderate ([place]) 06/06/2018    Moderate cocaine use disorder ([place]) 06/06/2018   Home Medication(s) Prior to Admission medications   Medication Sig Start Date End Date Taking? Authorizing Provider  QUEtiapine (SEROQUEL) 50 MG tablet Take 1 tablet (50 mg total) by mouth at bedtime. 09/20/19 11/19/19  Marianna Payment, MD  traZODone (DESYREL) 100 MG tablet Take 1 tablet (100 mg total) by mouth at bedtime. 09/20/19 11/19/19  Marianna Payment, MD                                                                                                                                    Past Surgical History Past Surgical History:  Procedure Laterality Date   IRRIGATION AND DEBRIDEMENT ELBOW Right 09/17/2019   Procedure: IRRIGATION AND DEBRIDEMENT RIGHT  ELBOW AND RIGHT WRIST;  Surgeon: Renette Butters, MD;  Location: [facility];  Service: Orthopedics;  Laterality: Right;   KIDNEY STONE SURGERY  Family History Family History  Problem Relation Age of Onset   Hypertension Mother    Depression Mother    Hyperlipidemia Father    Diabetes Paternal Grandmother     Social History Social History   Tobacco Use   Smoking status: Light Smoker    Types: Cigarettes   Smokeless tobacco: Never   Tobacco comments:    uses vape pen primarily now  Substance Use Topics   Alcohol use: Yes    Comment: not since found out was pregnant at 2 mths.   Drug use: Yes    Types: IV   Allergies Latex  Review of Systems Review of Systems All other systems are reviewed and are negative for acute change except as noted in the HPI  Physical Exam Vital Signs  I have reviewed the triage vital signs BP (!) 84/59   Pulse 91   Temp 98.6 F (37 C) (Oral)   Resp (!) 24   Ht 5\' 1"  (1.549 m)   Wt 45 kg   SpO2 97%   BMI 18.74 kg/m   Physical Exam Vitals reviewed.  Constitutional:      General: She is not in acute distress.    Appearance: She is well-developed. She is not diaphoretic.  HENT:     Head: Normocephalic and atraumatic.     Nose: Nose  normal.  Eyes:     General: No scleral icterus.       Right eye: No discharge.        Left eye: No discharge.     Conjunctiva/sclera: Conjunctivae normal.     Pupils: Pupils are equal, round, and reactive to light.  Cardiovascular:     Rate and Rhythm: Normal rate and regular rhythm.     Heart sounds: No murmur heard.   No friction rub. No gallop.  Pulmonary:     Effort: Pulmonary effort is normal. No respiratory distress.     Breath sounds: Normal breath sounds. No stridor. No rales.  Chest:     Chest wall: Tenderness present.    Abdominal:     General: There is no distension.     Palpations: Abdomen is soft.     Tenderness: There is no abdominal tenderness.  Musculoskeletal:        General: No tenderness.     Cervical back: Normal range of motion and neck supple.  Skin:    General: Skin is warm and dry.     Findings: Erythema and lesion (numerous excoriated lesions through face, arms, and legs) present. No rash.       Neurological:     Mental Status: She is alert and oriented to person, place, and time.    ED Results and Treatments Labs (all labs ordered are listed, but only abnormal results are displayed) Labs Reviewed  COMPREHENSIVE METABOLIC PANEL - Abnormal; Notable for the following components:      Result Value   Sodium 127 (*)    Chloride 91 (*)    Glucose, Bld 115 (*)    Calcium 8.3 (*)    Albumin 2.6 (*)    All other components within normal limits  CBC WITH DIFFERENTIAL/PLATELET - Abnormal; Notable for the following components:   WBC 15.9 (*)    Hemoglobin 11.0 (*)    HCT 32.8 (*)    Neutro Abs 12.5 (*)    Monocytes Absolute 1.1 (*)    Abs Immature Granulocytes 0.10 (*)    All other components within normal limits  APTT -  Abnormal; Notable for the following components:   aPTT 39 (*)    All other components within normal limits  URINALYSIS, ROUTINE W REFLEX MICROSCOPIC - Abnormal; Notable for the following components:   Color, Urine AMBER (*)     APPearance HAZY (*)    Protein, ur 30 (*)    All other components within normal limits  RESP PANEL BY RT-PCR (FLU A&B, COVID) ARPGX2  CULTURE, BLOOD (ROUTINE X 2)  CULTURE, BLOOD (ROUTINE X 2)  CULTURE, BLOOD (SINGLE)  LACTIC ACID, PLASMA  PROTIME-INR  LACTIC ACID, PLASMA  HEPARIN LEVEL (UNFRACTIONATED)  I-STAT BETA HCG BLOOD, ED ([facility], [facility], AP ONLY)  TROPONIN I (HIGH SENSITIVITY)  TROPONIN I (HIGH SENSITIVITY)                                                                                                                         EKG  EKG Interpretation  Date/Time:  Thursday December 04 2020 20:52:30 EST Ventricular Rate:  103 PR Interval:  132 QRS Duration: 68 QT Interval:  296 QTC Calculation: 387 R Axis:   62 Text Interpretation: Sinus tachycardia Baseline wander Otherwise no significant change Confirmed by Drema Pryardama, Gaylen Venning [phone]([id number]) on 12/05/2020 12:50:55 AM       Radiology DG Chest 1 View  Result Date: 12/05/2020 CLINICAL DATA:  Chest pain. EXAM: CHEST  1 VIEW COMPARISON:  Chest radiograph dated 09/19/2019. FINDINGS: Diffuse interstitial nodularity may represent mild edema. Atypical infection is not excluded clinical correlation is recommended no focal consolidation, pleural effusion or pneumothorax. The cardiac silhouette is within limits. Scoliosis. No acute osseous pathology. IMPRESSION: Diffuse interstitial nodularity may represent mild edema. Atypical infection is not excluded. No focal consolidation. Electronically Signed   By: Elgie CollardArash  Radparvar M.D.   On: 12/05/2020 00:19   CT Angio Chest PE W and/or Wo Contrast  Result Date: 12/05/2020 CLINICAL DATA:  Left chest pain, dyspnea.  PE suspected, high prob. EXAM: CT ANGIOGRAPHY CHEST WITH CONTRAST TECHNIQUE: Multidetector CT imaging of the chest was performed using the standard protocol during bolus administration of intravenous contrast. Multiplanar CT image reconstructions and MIPs were obtained to evaluate the vascular  anatomy. CONTRAST:  80mL OMNIPAQUE IOHEXOL 350 MG/ML SOLN COMPARISON:  None. FINDINGS: Cardiovascular: Adequate opacification of the pulmonary arterial tree. No intraluminal filling defect identified to suggest acute pulmonary embolism. Central pulmonary arteries are of normal caliber. No significant coronary artery calcification. Cardiac size within normal limits. No pericardial effusion. No significant atherosclerotic calcification within the thoracic aorta. No aortic aneurysm. Mediastinum/Nodes: There is left axillary and subpectoral adenopathy. No mediastinal or hilar adenopathy identified. Thyroid unremarkable. Esophagus unremarkable. Lungs/Pleura: There is a cavitary, inflammatory appearing lesion within the inferior lingula which may represent the sequela of septic embolization or necrotizing pneumonia. An impacted airway is seen communicating with this region. A necrotic mass is considered less likely. The lungs are otherwise clear. No pneumothorax or pleural effusion. Upper Abdomen: No acute abnormality. Musculoskeletal: No chest wall abnormality. No acute or significant osseous  findings. Subcutaneous edema is noted within the visualized left upper extremity with inflammatory stranding noted within the axilla. Review of the MIP images confirms the above findings. IMPRESSION: Shotty left axillary and subpectoral adenopathy, possibly reactive in nature and related to an inflammatory process within the left upper extremity and axilla. No pulmonary embolism. Cavitary lesion within the basilar lingula in keeping with the sequela of septic embolization or necrotizing pneumonia. Follow-up evaluation to document resolution is recommended following conservative therapy. Electronically Signed   By: Fidela Salisbury M.D.   On: 12/05/2020 03:27   CT Extrem Up Entire Arm L WO/W CM  Result Date: 12/05/2020 CLINICAL DATA:  Concern for cellulitis of the left upper extremity. EXAM: CT OF THE UPPER LEFT EXTREMITY WITHOUT  AND WITH CONTRAST TECHNIQUE: Multidetector CT imaging of the upper left extremity was performed following the standard protocol before and during bolus administration of intravenous contrast. COMPARISON:  None. CONTRAST:  3mL OMNIPAQUE IOHEXOL 350 MG/ML SOLN FINDINGS: Bones/Joint/Cartilage There is no acute fracture or dislocation. The bones are well mineralized. No arthritic changes. No joint effusion. Ligaments Suboptimally assessed by CT. Muscles and Tendons There is intramuscular edema. Soft tissues There is diffuse subcutaneous edema of the left upper extremity consistent with cellulitis. No drainable fluid collection/abscess. There is a long segment thrombosed vein involving the medial left upper extremity extending from the distal forearm into the axilla consistent with long segment DVT. This appears to involve the brachial vein extending into the axillary vein. No drainable fluid collection/abscess. IMPRESSION: 1. Long segment venous thrombus extending into the left axillary vein at the junction of the subclavian vein consistent with DVT. Associated inflammatory changes indicative of thrombophlebitis. Further evaluation with dedicated duplex ultrasound recommended. 2. No drainable fluid collection/abscess. These results were called by telephone at the time of interpretation on 12/05/2020 at 3:18 am to provider [facility] , who verbally acknowledged these results. Electronically Signed   By: Anner Crete M.D.   On: 12/05/2020 03:23    Pertinent labs & imaging results that were available during my care of the patient were reviewed by me and considered in my medical decision making (see MDM for details).  Medications Ordered in ED Medications  sodium chloride 0.9 % bolus 1,000 mL (0 mLs Intravenous Stopped 12/05/20 0401)    Followed by  0.9 %  sodium chloride infusion (1,000 mLs Intravenous New Bag/Given 12/05/20 0139)  vancomycin (VANCOCIN) IVPB 1000 mg/200 mL premix (has no administration in  time range)  heparin ADULT infusion 100 units/mL (25000 units/27mL) (800 Units/hr Intravenous New Bag/Given 12/05/20 0405)  sodium chloride 0.9 % bolus 1,000 mL (has no administration in time range)  cefTRIAXone (ROCEPHIN) 2 g in sodium chloride 0.9 % 100 mL IVPB (2 g Intravenous New Bag/Given 12/05/20 0358)  iohexol (OMNIPAQUE) 350 MG/ML injection 80 mL (80 mLs Intravenous Contrast Given 12/05/20 0305)  morphine 4 MG/ML injection 6 mg (6 mg Intravenous Given 12/05/20 0356)  heparin bolus via infusion 3,000 Units (3,000 Units Intravenous Bolus from Bag 12/05/20 0406)  Procedures .1-3 Lead EKG Interpretation Performed by: Fatima Blank, MD Authorized by: Fatima Blank, MD     Interpretation: normal     ECG rate:  98   ECG rate assessment: normal     Rhythm: sinus rhythm     Ectopy: none     Conduction: normal   Ultrasound ED Soft Tissue  Date/Time: 12/05/2020 5:29 AM Performed by: Fatima Blank, MD Authorized by: Fatima Blank, MD   Procedure details:    Indications: evaluate for cellulitis     Transverse view:  Visualized   Longitudinal view:  Visualized   Images: archived   Location:    Location: upper extremity     Side:  Left Findings:     no abscess present    cellulitis present .Critical Care Performed by: Fatima Blank, MD Authorized by: Fatima Blank, MD   Critical care provider statement:    Critical care time (minutes):  80   Critical care time was exclusive of:  Separately billable procedures and treating other patients   Critical care was necessary to treat or prevent imminent or life-threatening deterioration of the following conditions:  Sepsis and circulatory failure   Critical care was time spent personally by me on the following activities:  Development of treatment plan  with patient or surrogate, discussions with consultants, evaluation of patient's response to treatment, examination of patient, obtaining history from patient or surrogate, review of old charts, re-evaluation of patient's condition, pulse oximetry, ordering and review of radiographic studies, ordering and review of laboratory studies and ordering and performing treatments and interventions   Care discussed with: admitting provider    (including critical care time)  Medical Decision Making / ED Course I have reviewed the nursing notes for this encounter and the patient's prior records (if available in EHR or on provided paperwork).  Jalexus Borel was evaluated in Emergency Department on 12/05/2020 for the symptoms described in the history of present illness. She was evaluated in the context of the global COVID-19 pandemic, which necessitated consideration that the patient might be at risk for infection with the SARS-CoV-2 virus that causes COVID-19. Institutional protocols and algorithms that pertain to the evaluation of patients at risk for COVID-19 are in a state of rapid change based on information released by regulatory bodies including the CDC and federal and state organizations. These policies and algorithms were followed during the patient's care in the ED.     Patient here with left arm pain and swelling with redness. History of IV drug use. Also complaining of left anterior chest wall pain.  EKG with sinus tachycardia.  No acute ischemic changes or evidence of pericarditis. Bedside ultrasound confirmed inflammatory changes of the left upper extremity concerning for cellulitis.  Questionable DVT.  No evidence of abscess.  Patient noted to be hypotensive with systolics in the [id number].  Improved with 1 L IV fluid.  Additional IV fluid liter given.  Responds well to fluids.  Pertinent labs & imaging results that were available during my care of the patient were reviewed by me and considered in  my medical decision making:  Labs notable for leukocytosis.  Code sepsis initiated and patient started on empiric antibiotics. 3 blood cultures obtained.  Will need to rule out endocarditis in this patient.  CT of the left upper extremity notable for likely DVT.  No abscess.  CT PE study without pulmonary emboli or acute pneumonia.  Noted cavitary lesion from sequelae of prior likely septic  emboli.  Serial troponins negative x2.  Patient started on IV heparin drip.  Admitted to medicine for further work-up and management.   Final Clinical Impression(s) / ED Diagnoses Final diagnoses:  Acute deep vein thrombosis (DVT) of axillary vein of left upper extremity ([facility])  Left arm cellulitis     This chart was dictated using voice recognition software.  Despite best efforts to proofread,  errors can occur which can change the documentation meaning.    Nira Conn, MD 12/05/20 [phone]

## 2020-12-05 NOTE — ED Notes (Signed)
Report given to rn [email or web address] 

## 2020-12-05 NOTE — ED Notes (Signed)
T c/o pain to chest and lt arm. Noted redness and swelling to lt upper arm, warm to the touch. Pt is currently refusing antibiotics until she receives pain meds. Provider made aware

## 2020-12-05 NOTE — ED Notes (Signed)
Dietary called for breakfast tray.

## 2020-12-05 NOTE — Progress Notes (Signed)
PHARMACY - PHYSICIAN COMMUNICATION CRITICAL VALUE ALERT - BLOOD CULTURE IDENTIFICATION (BCID)  Summer Cain is an 35 y.o. 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 Blood cultures positive for strep pyogenes.  Name of physician (or Provider) Contacted: Marinda Elk  Current antibiotics: cefepime / vancomycin  Changes to prescribed antibiotics recommended:  De-escalate to Penicillin G continuous infusion 12 million units q12h + Clindamycin 600mg  IV q8h  Results for orders placed or performed during the hospital encounter of 12/04/20  Blood Culture ID Panel (Reflexed) (Collected: 12/04/2020  9:08 PM)  Result Value Ref Range   Enterococcus faecalis NOT DETECTED NOT DETECTED   Enterococcus Faecium NOT DETECTED NOT DETECTED   Listeria monocytogenes NOT DETECTED NOT DETECTED   Staphylococcus species NOT DETECTED NOT DETECTED   Staphylococcus aureus (BCID) NOT DETECTED NOT DETECTED   Staphylococcus epidermidis NOT DETECTED NOT DETECTED   Staphylococcus lugdunensis NOT DETECTED NOT DETECTED   Streptococcus species DETECTED (A) NOT DETECTED   Streptococcus agalactiae NOT DETECTED NOT DETECTED   Streptococcus pneumoniae NOT DETECTED NOT DETECTED   Streptococcus pyogenes DETECTED (A) NOT DETECTED   A.calcoaceticus-baumannii NOT DETECTED NOT DETECTED   Bacteroides fragilis NOT DETECTED NOT DETECTED   Enterobacterales NOT DETECTED NOT DETECTED   Enterobacter cloacae complex NOT DETECTED NOT DETECTED   Escherichia coli NOT DETECTED NOT DETECTED   Klebsiella aerogenes NOT DETECTED NOT DETECTED   Klebsiella oxytoca NOT DETECTED NOT DETECTED   Klebsiella pneumoniae NOT DETECTED NOT DETECTED   Proteus species NOT DETECTED NOT DETECTED   Salmonella species NOT DETECTED NOT DETECTED   Serratia marcescens NOT  DETECTED NOT DETECTED   Haemophilus influenzae NOT DETECTED NOT DETECTED   Neisseria meningitidis NOT DETECTED NOT DETECTED   Pseudomonas aeruginosa NOT DETECTED NOT DETECTED   Stenotrophomonas maltophilia NOT DETECTED NOT DETECTED   Candida albicans NOT DETECTED NOT DETECTED   Candida auris NOT DETECTED NOT DETECTED   Candida glabrata NOT DETECTED NOT DETECTED   Candida krusei NOT DETECTED NOT DETECTED   Candida parapsilosis NOT DETECTED NOT DETECTED   Candida tropicalis NOT DETECTED NOT DETECTED   Cryptococcus neoformans/gattii NOT DETECTED NOT DETECTED    12/06/2020, PharmD, BCPS Please check AMION for all [facility] contact numbers Clinical Pharmacist 12/05/2020 5:01 PM

## 2020-12-05 NOTE — ED Notes (Signed)
IV team request made for second line at this time

## 2020-12-05 NOTE — Progress Notes (Addendum)
HOSPITAL MEDICINE OVERNIGHT EVENT NOTE    Notified by nursing the patient is experiencing a low blood pressure of 84/56.  Chart reviewed, patient is currently being treated for strep pyogenes bacteremia with left upper extremity cellulitis and acute DVT with concern for infective endocarditis.  Patient is currently awake alert and oriented x3.  Patient is urinating and tolerating oral intake.  MAP is greater than 65.  Blood pressures seem to have been in the same range throughout the entire hospitalization.  Patient is currently on intravenous fluids which will be extended throughout the evening shift.  Otherwise we will continue current treatment plan.  Nursing to notify me if patient's blood pressures decrease further.  Summer Elk  MD [facility]   ADDENDUM (11/19 1:45am)  Nursing reports BP 73/51.  Will order 500 cc NS bolus.  Summer Cain  ADDENDUM (11/19 4:45am)  Nursing reports blood pressure now 85/52, which is the approximate average BP for this patient since she has been hospitalized.  Continuing to receive continuous IV fluids at 125 cc an hour.  Continue to monitor closely.  Summer Cain

## 2020-12-05 NOTE — Progress Notes (Signed)
Pharmacy Antibiotic Note  Summer Cain is a 35 y.o. female with h/o IVDU admitted on 12/04/2020 with septic emboli/necrotizing PNA.  Pharmacy has been consulted for Vancomycin and Cefepime  dosing.  Vancomycin 1 g IV given in ED at 0615  Plan: Vancomycin 750 mg IV q12h Cefepime 2 g IV q8h  Height: 5\' 1"  (154.9 cm) Weight: 45 kg (99 lb 3.3 oz) IBW/kg (Calculated) : 47.8  Temp (24hrs), Avg:98.6 F (37 C), Min:98.6 F (37 C), Max:98.6 F (37 C)  Recent Labs  Lab 12/04/20 2108  WBC 15.9*  CREATININE 0.72  LATICACIDVEN 1.1    Estimated Creatinine Clearance: 71.1 mL/min (by C-G formula based on SCr of 0.72 mg/dL).    Allergies  Allergen Reactions   Latex Swelling   2109 12/05/2020 6:31 AM

## 2020-12-05 NOTE — Sepsis Progress Note (Signed)
Elink following Code Sepsis.  Abx administration delayed per pt request as stated in bedside RN note.

## 2020-12-05 NOTE — ED Notes (Signed)
Pt c/o pain

## 2020-12-05 NOTE — Progress Notes (Signed)
ANTICOAGULATION CONSULT NOTE - Initial Consult  Pharmacy Consult for Heparin Indication: R/O DVT  Allergies  Allergen Reactions   Latex Swelling    Patient Measurements: Height: 5' (152.4 cm) Weight: 49.4 kg (108 lb 14.5 oz) IBW/kg (Calculated) : 45.5  Vital Signs: Temp: 97.7 F (36.5 C) (11/18 2008) Temp Source: Oral (11/18 2008) BP: 84/56 (11/18 2112) Pulse Rate: 69 (11/18 2112)  Labs: Recent Labs    12/04/20 2108 12/05/20 0130 12/05/20 0927 12/05/20 2046  HGB 11.0*  --   --   --   HCT 32.8*  --   --   --   PLT 181  --   --   --   APTT 39*  --   --   --   LABPROT 15.0  --   --   --   INR 1.2  --   --   --   HEPARINUNFRC  --   --  <0.10* <0.10*  CREATININE 0.72  --   --   --   TROPONINIHS 4 3  --   --      Estimated Creatinine Clearance: 71.8 mL/min (by C-G formula based on SCr of 0.72 mg/dL).   Medical History: Past Medical History:  Diagnosis Date   Pregnant    Scoliosis     Medications:  No current facility-administered medications on file prior to encounter.   Current Outpatient Medications on File Prior to Encounter  Medication Sig Dispense Refill   QUEtiapine (SEROQUEL) 200 MG tablet Take 200 mg by mouth at bedtime.     traZODone (DESYREL) 100 MG tablet Take 50-100 mg by mouth See admin instructions. 100mg  at bedtime, may take an additional 50mg  as needed for sleep     [DISCONTINUED] QUEtiapine (SEROQUEL) 50 MG tablet Take 1 tablet (50 mg total) by mouth at bedtime. 30 tablet 1   [DISCONTINUED] traZODone (DESYREL) 100 MG tablet Take 1 tablet (100 mg total) by mouth at bedtime. 30 tablet 1     Assessment: 35 y.o. female with LUE swelling and DVT of upper extremity on CT. Initial heparin level undetectable on 800 units/hr with no infusion issues and re-bolus 3000 units with rate increase to 950 units/hr.   Heparin level: <0.10, sub-therapeutic   Nurse noted that the patient turned off the heparin infusion and was likely off for about an hour.  Prior to lab draw, estimated to be resumed for 30-45 minutes.  Goal of Therapy:  Heparin level 0.3-0.7 units/ml Monitor platelets by anticoagulation protocol: Yes   Plan:  Re-bolus heparin IV 3000 units Continue heparin IV 950 units/hr infusion 6h heparin level  Daily CBC and heparin level F/u ability to transition to oral therapy  Thank you for allowing pharmacy to participate in this patient's care.  , PharmD PGY1 Acute Care Resident  12/05/2020,9:29 PM

## 2020-12-05 NOTE — ED Notes (Signed)
Provided something to eat and drink at this time °

## 2020-12-06 ENCOUNTER — Inpatient Hospital Stay (HOSPITAL_COMMUNITY): Payer: Medicaid Other

## 2020-12-06 DIAGNOSIS — F1990 Other psychoactive substance use, unspecified, uncomplicated: Secondary | ICD-10-CM | POA: Diagnosis not present

## 2020-12-06 DIAGNOSIS — E871 Hypo-osmolality and hyponatremia: Secondary | ICD-10-CM | POA: Diagnosis not present

## 2020-12-06 DIAGNOSIS — I82A12 Acute embolism and thrombosis of left axillary vein: Secondary | ICD-10-CM | POA: Diagnosis not present

## 2020-12-06 DIAGNOSIS — I82622 Acute embolism and thrombosis of deep veins of left upper extremity: Secondary | ICD-10-CM | POA: Diagnosis not present

## 2020-12-06 MED ORDER — HYDROMORPHONE HCL 2 MG PO TABS
2.0000 mg | ORAL_TABLET | ORAL | Status: DC | PRN
  Administered 2020-12-06: 2 mg via ORAL
  Filled 2020-12-06: qty 1

## 2020-12-06 MED ORDER — ENOXAPARIN SODIUM 60 MG/0.6ML IJ SOSY
50.0000 mg | PREFILLED_SYRINGE | Freq: Two times a day (BID) | INTRAMUSCULAR | Status: DC
  Administered 2020-12-06 – 2020-12-11 (×12): 50 mg via SUBCUTANEOUS
  Filled 2020-12-06 (×12): qty 0.6

## 2020-12-06 MED ORDER — METHADONE HCL 10 MG PO TABS: 20.0000 mg | ORAL_TABLET | Freq: Every day | ORAL | Status: DC

## 2020-12-06 MED ORDER — SODIUM CHLORIDE 0.9 % IV BOLUS: 500.0000 mL | Freq: Once | INTRAVENOUS | Status: AC

## 2020-12-06 MED ORDER — METHADONE HCL 10 MG PO TABS
20.0000 mg | ORAL_TABLET | Freq: Every day | ORAL | Status: DC
  Administered 2020-12-06 – 2020-12-27 (×22): 20 mg via ORAL
  Filled 2020-12-06 (×22): qty 2

## 2020-12-06 MED ORDER — IOHEXOL 300 MG/ML  SOLN
50.0000 mL | Freq: Once | INTRAMUSCULAR | Status: AC | PRN
  Administered 2020-12-06: 50 mL via INTRAVENOUS

## 2020-12-06 NOTE — Progress Notes (Signed)
3 Different lab. Tech. Has attempted to draw blood and unsuccessful. Per Lab policy they can not come back to draw blood for 24 hours.

## 2020-12-06 NOTE — Consult Note (Addendum)
[address]       [place] [address]             [phone]        Meggan Billups [place] Medical Record [id number] Date of Birth: 27-Oct-1985  Referring: Aileen Fass Primary Care: Patient, No Pcp Per (Inactive) Primary Cardiologist:None  Chief Complaint:    Chief Complaint  Patient presents with   Chest Pain / Cellulitis    History of Present Illness:      Ms. Matsen is a 35 yo white female with history of IV drug abuse, DVT of LUE, and Hepatitis C.   Patient was noted to have possible MV vegetation back in 2021 on Echocardiogram.  Her blood cultures were also positive for Pseudomonas, MRSA, and Serratia.  The patient left AMA at that time.  Also in august of 2021 she had I/D of her elbow by Dr. Percell Miller.   She presented to the ED on 11/17 with complaints of left arm swelling, redness, and pain.  She also complained of chest pain, shortness of breath and hemoptysis.  This site is previously where she injected drugs.  She stated she had been clean for a few months, but relapsed about 1 month ago.  She now uses intranasally but does inject in her right hand at times.  She is also homeless.  Workup in the ED showed the patient to be hypotensive which responded to IV fluids.  Labs showed leukocytosis, sepsis protocol was initiated and blood cultures were obtained.  CT scan showed DVT of left upper extremity, w/o evidence of abscess. There was no evidence of PE.  There was a cavitary lesion likely from prior septic emboli located in the lingula and long segment venous thrombus with extension in the left axillary vein.  She was admitted for further care and subsequent endocarditis workup.  She was started on Heparin for her DVT and broad spectrum antibiotic coverage.  She was found to be hyponatremic and was treated with IV fluids and insulin infusion.  Blood cultures came back positive for Streptococcus Pyogenes.  2D Echocardiogram was obtained and showed evidence of  vegetations on mitral valve and tricuspid valve.  There was also concern for abscess of aortic root. Cardiothoracic surgery consultation has been requested.  Currently the patient is visibly uncomfortable.  She states that she is having a lot pain along her chest and left arm.  Her chest pain is worse with deep breathing.  She states she last used drugs several days via intranasal route.  She has poor dentition and states she has had dental problems before.  She does not receive regular dental care.  Current Activity/ Functional Status: Patient is independent with mobility/ambulation, transfers, ADL's, IADL's.   Zubrod Score: At the time of surgery this patient's most appropriate activity status/level should be described as: []     0    Normal activity, no symptoms []     1    Restricted in physical strenuous activity but ambulatory, able to do out light work []     2    Ambulatory and capable of self care, unable to do work activities, up and about                 more than 50%  Of the time                            []   3    Only limited self care, in bed greater than 50% of waking hours []     4    Completely disabled, no self care, confined to bed or chair []     5    Moribund  Past Medical History:  Diagnosis Date   Pregnant    Scoliosis     Past Surgical History:  Procedure Laterality Date   IRRIGATION AND DEBRIDEMENT ELBOW Right 09/17/2019   Procedure: IRRIGATION AND DEBRIDEMENT RIGHT  ELBOW AND RIGHT WRIST;  Surgeon: Renette Butters, MD;  Location: [facility];  Service: Orthopedics;  Laterality: Right;   KIDNEY STONE SURGERY      Social History   Tobacco Use  Smoking Status Light Smoker   Types: Cigarettes  Smokeless Tobacco Never  Tobacco Comments   uses vape pen primarily now    Social History   Substance and Sexual Activity  Alcohol Use Yes   Comment: not since found out was pregnant at 2 mths.     Allergies  Allergen Reactions   Latex Swelling    Current  Facility-Administered Medications  Medication Dose Route Frequency Provider Last Rate Last Admin   acetaminophen (TYLENOL) tablet 650 mg  650 mg Oral Q6H PRN Opyd, Ilene Qua, MD       Or   acetaminophen (TYLENOL) suppository 650 mg  650 mg Rectal Q6H PRN Opyd, Ilene Qua, MD       clindamycin (CLEOCIN) IVPB 600 mg  600 mg Intravenous Q8H Charlynne Cousins, MD 100 mL/hr at 12/06/20 0813 600 mg at 12/06/20 0813   enoxaparin (LOVENOX) injection 50 mg  50 mg Subcutaneous Q12H Charlynne Cousins, MD       HYDROmorphone (DILAUDID) tablet 2 mg  2 mg Oral Q4H PRN Charlynne Cousins, MD       ketorolac (TORADOL) 15 MG/ML injection 15 mg  15 mg Intravenous Q6H Opyd, Ilene Qua, MD   15 mg at 12/06/20 [id number]   loperamide (IMODIUM) capsule 2 mg  2 mg Oral PRN Vernelle Emerald, MD   2 mg at 12/05/20 2223   methadone (DOLOPHINE) tablet 20 mg  20 mg Oral Daily Charlynne Cousins, MD       ondansetron [facility]) tablet 4 mg  4 mg Oral Q6H PRN Opyd, Ilene Qua, MD       Or   ondansetron (ZOFRAN) injection 4 mg  4 mg Intravenous Q6H PRN Opyd, Ilene Qua, MD       oxyCODONE (Oxy IR/ROXICODONE) immediate release tablet 5 mg  5 mg Oral Q4H PRN Opyd, Ilene Qua, MD       pantoprazole (PROTONIX) EC tablet 40 mg  40 mg Oral Daily Opyd, Ilene Qua, MD   40 mg at 12/06/20 0810   penicillin G potassium 12 Million Units in dextrose 5 % 500 mL continuous infusion  12 Million Units Intravenous Q12H Charlynne Cousins, MD 41.7 mL/hr at 12/06/20 1036 12 Million Units at 12/06/20 1036    Medications Prior to Admission  Medication Sig Dispense Refill Last Dose   QUEtiapine (SEROQUEL) 200 MG tablet Take 200 mg by mouth at bedtime.   Past Month   traZODone (DESYREL) 100 MG tablet Take 50-100 mg by mouth See admin instructions. 100mg  at bedtime, may take an additional 50mg  as needed for sleep   Past Month    Family History  Problem Relation Age of Onset   Hypertension Mother    Depression Mother    Hyperlipidemia Father  Diabetes Paternal Grandmother      Review of Systems:   ROS     Cardiac Review of Systems: Y or  [    ]= no  Chest Pain [ Y   ]  Resting SOB [   ] Exertional SOB  [  ]  Orthopnea [  ]   Pedal Edema [   ]    Palpitations [  ] Syncope  [  ]   Presyncope [   ]  General Review of Systems: [Y] = yes [  ]=no Constitional: recent weight change [  ]; anorexia [  ]; fatigue [ Y ]; nausea [  ]; night sweats [  ]; fever [ Y ]; or chills [Y  ]                                                               Dental: Last Dentist visit: poor dentition, unknown  Eye : blurred vision [  ]; diplopia [   ]; vision changes [  ];  Amaurosis fugax[  ]; Resp: cough [  ];  wheezing[  ];  hemoptysis[ Y ]; shortness of breath[  ]; paroxysmal nocturnal dyspnea[  ]; dyspnea on exertion[  ]; or orthopnea[  ];  GI:  gallstones[  ], vomiting[  ];  dysphagia[  ]; melena[  ];  hematochezia [  ]; heartburn[  ];   Hx of  Colonoscopy[  ]; GU: kidney stones [  ]; hematuria[  ];   dysuria [  ];  nocturia[  ];  history of     obstruction [  ]; urinary frequency [  ]             Skin: rash, swelling[ Y, multiple excoriations on face, torso, UE/LE ];, hair loss[  ];  peripheral edema[  ];  or itching[  ]; Musculosketetal: myalgias[Y  ];  joint swelling[  ];  joint erythema[  ];  joint pain[  ];  back pain[  ];  Heme/Lymph: bruising[  ];  bleeding[  ];  anemia[  ];  Neuro: TIA[  ];  headaches[  ];  stroke[  ];  vertigo[  ];  seizures[  ];   paresthesias[  ];  difficulty walking[  ];  Psych:depression[  ]; anxiety[  ];  Physical Exam: BP 91/67 (BP Location: Right Arm)   Pulse 69   Temp 97.8 F (36.6 C) (Oral)   Resp 17   Ht 5' (1.524 m)   Wt 49.3 kg   SpO2 99%   BMI 21.23 kg/m   General appearance: alert, cooperative, and no distress Head: Normocephalic, without obvious abnormality, atraumatic Resp: clear to auscultation bilaterally Cardio: regular rate and rhythm GI: soft, non-tender; bowel sounds normal; no  masses,  no organomegaly Extremities: no edema, excoriations present, left UE erythematous, swollen, excoriation along medial aspect of elbow Neurologic: Grossly normal  Diagnostic Studies & Laboratory data:     Recent Radiology Findings:   DG Chest 1 View  Result Date: 12/05/2020 CLINICAL DATA:  Chest pain. EXAM: CHEST  1 VIEW COMPARISON:  Chest radiograph dated 09/19/2019. FINDINGS: Diffuse interstitial nodularity may represent mild edema. Atypical infection is not excluded clinical correlation is recommended no focal consolidation, pleural effusion or pneumothorax. The cardiac silhouette is within limits.  Scoliosis. No acute osseous pathology. IMPRESSION: Diffuse interstitial nodularity may represent mild edema. Atypical infection is not excluded. No focal consolidation. Electronically Signed   By: Elgie Collard M.D.   On: 12/05/2020 00:19   CT Angio Chest PE W and/or Wo Contrast  Result Date: 12/05/2020 CLINICAL DATA:  Left chest pain, dyspnea.  PE suspected, high prob. EXAM: CT ANGIOGRAPHY CHEST WITH CONTRAST TECHNIQUE: Multidetector CT imaging of the chest was performed using the standard protocol during bolus administration of intravenous contrast. Multiplanar CT image reconstructions and MIPs were obtained to evaluate the vascular anatomy. CONTRAST:  84mL OMNIPAQUE IOHEXOL 350 MG/ML SOLN COMPARISON:  None. FINDINGS: Cardiovascular: Adequate opacification of the pulmonary arterial tree. No intraluminal filling defect identified to suggest acute pulmonary embolism. Central pulmonary arteries are of normal caliber. No significant coronary artery calcification. Cardiac size within normal limits. No pericardial effusion. No significant atherosclerotic calcification within the thoracic aorta. No aortic aneurysm. Mediastinum/Nodes: There is left axillary and subpectoral adenopathy. No mediastinal or hilar adenopathy identified. Thyroid unremarkable. Esophagus unremarkable. Lungs/Pleura: There is a  cavitary, inflammatory appearing lesion within the inferior lingula which may represent the sequela of septic embolization or necrotizing pneumonia. An impacted airway is seen communicating with this region. A necrotic mass is considered less likely. The lungs are otherwise clear. No pneumothorax or pleural effusion. Upper Abdomen: No acute abnormality. Musculoskeletal: No chest wall abnormality. No acute or significant osseous findings. Subcutaneous edema is noted within the visualized left upper extremity with inflammatory stranding noted within the axilla. Review of the MIP images confirms the above findings. IMPRESSION: Shotty left axillary and subpectoral adenopathy, possibly reactive in nature and related to an inflammatory process within the left upper extremity and axilla. No pulmonary embolism. Cavitary lesion within the basilar lingula in keeping with the sequela of septic embolization or necrotizing pneumonia. Follow-up evaluation to document resolution is recommended following conservative therapy. Electronically Signed   By: Helyn Numbers M.D.   On: 12/05/2020 03:27   ECHOCARDIOGRAM COMPLETE  Result Date: 12/05/2020    ECHOCARDIOGRAM REPORT   Patient Name:   BETZABE BEVANS Date of Exam: 12/05/2020 Medical Rec #:  [id number]       Height:       61.0 in Accession #:    [id number]      Weight:       99.2 lb Date of Birth:  1985-11-15       BSA:          1.402 m Patient Age:    35 years        BP:           84/52 mmHg Patient Gender: F               HR:           69 bpm. Exam Location:  Inpatient Procedure: 2D Echo, Cardiac Doppler and Color Doppler Indications:    Endocarditis  History:        Patient has prior history of Echocardiogram examinations, most                 recent 09/17/2020. Signs/Symptoms:Chest Pain.  Sonographer:    Vanetta Shawl Referring Phys: [id number] TIMOTHY S OPYD  Sonographer Comments: Technically difficult due to patient being supine IMPRESSIONS  1. Findings are concerning for  endocarditis of the tricuspid and mitral valves as well as aortic root abscess. Recommend TEE to better assess.  2. Left ventricular ejection fraction, by estimation, is 55 to 60%. The left  ventricle has normal function. The left ventricle has no regional wall motion abnormalities. Left ventricular diastolic parameters were normal.  3. Right ventricular systolic function is normal. The right ventricular size is normal. There is normal pulmonary artery systolic pressure.  4. The anterior mitral valve leaflet is thickened and there is a mobile density (1.37 x 0.82 cm) on the ventricular side of the anterior leaflet.. The mitral valve is normal in structure. Mild mitral valve regurgitation. No evidence of mitral stenosis.  5. There is thickening on the atrial side of the tricuspid valve septal leaflet. Measures 1.3 x 1.5 cm. Findings concerning for endocarditis.  6. The aortic valve is tricuspid. Aortic valve regurgitation is not visualized. No aortic stenosis is present.  7. The aortic root appears thickened. 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  8. The inferior vena cava is dilated in size with >50% respiratory variability, suggesting right atrial pressure of 8 mmHg. FINDINGS  Left Ventricle: Left ventricular ejection fraction, by estimation, is 55 to 60%. The left ventricle has normal function. The left ventricle has no regional wall motion abnormalities. The left ventricular internal cavity size was normal in size. There is  no left ventricular hypertrophy. Left ventricular diastolic parameters were normal. Normal left ventricular filling pressure. Right Ventricle: The right ventricular size is normal. No increase in right ventricular wall thickness. Right ventricular systolic function is normal. There is normal pulmonary artery systolic pressure. The tricuspid regurgitant velocity is 1.90 m/s, and  with an assumed right atrial pressure of 8 mmHg, the estimated right ventricular systolic pressure is [id number] mmHg. Left  Atrium: Left atrial size was normal in size. Right Atrium: Right atrial size was normal in size. Pericardium: There is no evidence of pericardial effusion. Mitral Valve: The anterior mitral valve leaflet is thickened and there is a mobile density (1.37 x 0.82 cm) on the ventricular side of the anterior leaflet. The mitral valve is normal in structure. Mild mitral valve regurgitation. No evidence of mitral valve stenosis. Tricuspid Valve: There is thickening on the atrial side of the tricuspid valve septal leaflet. Measures 1.3 x 1.5 cm. Findings concerning for endocarditis. The tricuspid valve is normal in structure. Tricuspid valve regurgitation is trivial. No evidence of tricuspid stenosis. Aortic Valve: The aortic valve is tricuspid. Aortic valve regurgitation is not visualized. No aortic stenosis is present. Pulmonic Valve: The pulmonic valve was normal in structure. Pulmonic valve regurgitation is not visualized. No evidence of pulmonic stenosis. Aorta: The aortic root appears thickened. 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 The aortic root is normal in size and structure. Venous: The inferior vena cava is dilated in size with greater than 50% respiratory variability, suggesting right atrial pressure of 8 mmHg. IAS/Shunts: No atrial level shunt detected by color flow Doppler.  LEFT VENTRICLE PLAX 2D LVIDd:         4.50 cm     Diastology LVIDs:         2.70 cm     LV e' medial:    10.90 cm/s LV PW:         1.10 cm     LV E/e' medial:  9.6 LV IVS:        0.60 cm     LV e' lateral:   9.90 cm/s LVOT diam:     1.90 cm     LV E/e' lateral: 10.6 LVOT Area:     2.84 cm  LV Volumes (MOD) LV vol d, MOD A2C: 58.1 ml LV vol d, MOD A4C: 64.3  ml LV vol s, MOD A2C: 28.4 ml LV vol s, MOD A4C: 28.8 ml LV SV MOD A2C:     29.8 ml LV SV MOD A4C:     64.3 ml LV SV MOD BP:      34.5 ml IVC IVC diam: 2.50 cm LEFT ATRIUM             Index LA diam:        3.20 cm 2.28 cm/m LA Vol (A2C):   36.0 ml 25.68 ml/m LA Vol (A4C):   35.0 ml  24.96 ml/m LA Biplane Vol: 38.6 ml 27.53 ml/m                        PULMONIC VALVE AORTA                 PV Vmax:       0.99 m/s Ao Root diam: 2.60 cm PV Peak grad:  3.9 mmHg  MITRAL VALVE                TRICUSPID VALVE MV Area (PHT): 4.80 cm     TR Peak grad:   14.4 mmHg MV Decel Time: 158 msec     TR Vmax:        190.00 cm/s MV E velocity: 105.00 cm/s MV A velocity: 47.10 cm/s   SHUNTS MV E/A ratio:  2.23         Systemic Diam: 1.90 cm Skeet Latch MD Electronically signed by Skeet Latch MD Signature Date/Time: 12/05/2020/3:05:49 PM    Final    CT Extrem Up Entire Arm L WO/W CM  Result Date: 12/05/2020 CLINICAL DATA:  Concern for cellulitis of the left upper extremity. EXAM: CT OF THE UPPER LEFT EXTREMITY WITHOUT AND WITH CONTRAST TECHNIQUE: Multidetector CT imaging of the upper left extremity was performed following the standard protocol before and during bolus administration of intravenous contrast. COMPARISON:  None. CONTRAST:  62mL OMNIPAQUE IOHEXOL 350 MG/ML SOLN FINDINGS: Bones/Joint/Cartilage There is no acute fracture or dislocation. The bones are well mineralized. No arthritic changes. No joint effusion. Ligaments Suboptimally assessed by CT. Muscles and Tendons There is intramuscular edema. Soft tissues There is diffuse subcutaneous edema of the left upper extremity consistent with cellulitis. No drainable fluid collection/abscess. There is a long segment thrombosed vein involving the medial left upper extremity extending from the distal forearm into the axilla consistent with long segment DVT. This appears to involve the brachial vein extending into the axillary vein. No drainable fluid collection/abscess. IMPRESSION: 1. Long segment venous thrombus extending into the left axillary vein at the junction of the subclavian vein consistent with DVT. Associated inflammatory changes indicative of thrombophlebitis. Further evaluation with dedicated duplex ultrasound recommended. 2. No  drainable fluid collection/abscess. These results were called by telephone at the time of interpretation on 12/05/2020 at 3:18 am to provider [facility] , who verbally acknowledged these results. Electronically Signed   By: Anner Crete M.D.   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 would be abnormal to not have aortic valve involvement... would need TEE for better imaging picture.. this has been ordered... patient is in NSR on EKG Poor dentition- suspect likely dental issues, blood cultures growing group A strep.. needs OMFS evaluation, CT Maxillofacial ordered H/O IV drug abuse- per medicine, would ideally need rehabilitation plan in place as if patient would require surgical intervention high likelihood of repeat endocarditis infection if unable to remain sober Hyponatremia per primary LUE DVT- on Heparin ID- + Group A Strep Bacteremia- on ABX per ID Dispo- patient visibly uncomfortable in room.... + Endocarditis, with suspected root abscess, however abnormal to not have Aortic Valve involvement.. will need TEE to better evaluate, Dental evaluation and clearance... unfortunately if aortic root abscess is present patient will likely require surgical intervention.  Timing of this would be difficult with full operating room schedule.. will follow along until workup is complete and provide further recommendations.  I  spent  55 minutes counseling the patient face to face.   Ellwood Handler, PA-C 12/06/2020 11:03 AM  Patient seen and examined, agree with H&P.  I reviewed TTE, there are vegetations on mitral and tricuspid, but no obvious vegetation on aortic valve. There is not a definitive aortic root abscess, but she needs TEE to better visualize the area.  She is upset about having a sitter in room and threatening to leave AMA.  Needs TEE, orthopantogram  Dasha Kawabata C. Roxan Hockey, MD [facility] [phone]

## 2020-12-06 NOTE — Progress Notes (Signed)
   12/05/20 2008  Vitals  Temp 97.7 F (36.5 C)  Temp Source Oral  BP (!) 80/63  MAP (mmHg) 70  BP Location Right Arm  BP Method Automatic  Patient Position (if appropriate) Lying  Pulse Rate 62  Pulse Rate Source Monitor  ECG Heart Rate 62  Resp 16  Level of Consciousness  Level of Consciousness Alert  MEWS COLOR  MEWS Score Color Yellow  Oxygen Therapy  SpO2 95 %  O2 Device Room Air  Patient Activity (if Appropriate) In bed  Pain Assessment  Pain Scale 0-10  Pain Score 4  Pain Location Abdomen  Pain Orientation Other (Comment) (all over)  Pain Descriptors / Indicators Other (Comment) (hurts)  Pain Frequency Constant  Pain Onset On-going  Patients Stated Pain Goal 5  Height and Weight  Height 5' (1.524 m)  Weight 49.4 kg  Type of Scale Used Bed  Type of Weight Actual  BSA (Calculated - sq m) 1.45 sq meters  BMI (Calculated) 21.27  Weight in (lb) to have BMI = 25 127.7  MEWS Score  MEWS Temp 0  MEWS Systolic 2  MEWS Pulse 0  MEWS RR 0  MEWS LOC 0  MEWS Score 2  Provider Notification  Date Provider Notified 12/05/20  Time Provider Notified 2121  Notification Type Page  Notification Reason Change in status  Provider response See new orders  Date of Provider Response 12/05/20  Time of Provider Response 2200  Text paged Dr. Leafy Half the above patient voiced no complaints. Skin warm and dry . B.P. has been low since adm.

## 2020-12-06 NOTE — Progress Notes (Addendum)
   12/06/20 0138  Vitals  Temp (!) 97.5 F (36.4 C)  Temp Source Oral  BP (!) 73/51  MAP (mmHg) (!) 59  BP Location Right Arm  BP Method Automatic  Patient Position (if appropriate) Lying  Pulse Rate 60  Pulse Rate Source Monitor  ECG Heart Rate (!) 56  Resp 16  Level of Consciousness  Level of Consciousness Alert  MEWS COLOR  MEWS Score Color Yellow  Oxygen Therapy  SpO2 96 %  O2 Device Room Air  Pain Assessment  Pain Scale 0-10  Pain Score 0  MEWS Score  MEWS Temp 0  MEWS Systolic 2  MEWS Pulse 0  MEWS RR 0  MEWS LOC 0  MEWS Score 2  Text page Dr. Leafy Half the above. Patient voiced no complaints skin warm and dry.

## 2020-12-06 NOTE — Progress Notes (Addendum)
RN attempted oral swab for lab that was ordered. Patient refused at this time. Will pass on to nightshift RN to try again. Netta Corrigan, RN

## 2020-12-06 NOTE — Progress Notes (Signed)
Patient refused checking BP. Will try again later.  Netta Corrigan, RN

## 2020-12-06 NOTE — Progress Notes (Signed)
Lab came back to draw blood . Only  place can draw labs is her right hand due to DVT left arm and Heparin infusing in right arm Blood clotted off almost immed. Also Heparin is not compat. With her antibiotics. Please consider PICC placement with multi. Lumens.

## 2020-12-06 NOTE — Progress Notes (Addendum)
   12/06/20 0314  Vitals  Temp (!) 97.3 F (36.3 C)  Temp Source Oral  BP (!) 85/52 (after n.s. bolus)  MAP (mmHg) (!) 61  BP Location Right Arm  BP Method Automatic  Patient Position (if appropriate) Lying  Pulse Rate (!) 56  Pulse Rate Source Monitor  ECG Heart Rate (!) 56  Resp 16  Level of Consciousness  Level of Consciousness Alert  MEWS COLOR  MEWS Score Color Green  Oxygen Therapy  SpO2 98 %  O2 Device Room Air  Pain Assessment  Pain Scale 0-10  Pain Score 0  MEWS Score  MEWS Temp 0  MEWS Systolic 1  MEWS Pulse 0  MEWS RR 0  MEWS LOC 0  MEWS Score 1  V.S. post Bolus of N.S. Text paged Dr. Leafy Half at 04:25 the above vital signs.

## 2020-12-06 NOTE — Progress Notes (Signed)
[facility] PROGRESS NOTE    Progress Note  Summer Cain  [id number] DOB: January 09, 1986 DOA: 12/04/2020 PCP: Patient, No Pcp Per (Inactive)     Brief Narrative:   Summer Cain is an 35 y.o. female past medical history of IV drug abuse, opiate dependence hepatitis C with mitral valve vegetation on TTE July 2021, her cultures at that time were positive for Pseudomonas, MRSA and Serratia but unfortunately she left AMA comes into the ED for 1 week of left arm pain associated with swelling and redness she also has a lump on her chest wall and swollen lymph nodes.  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  12/08/2020 TEE   Assessment/Plan:   Acute deep vein thrombosis (DVT) of left upper extremity ([place]): Provoked likely due to IV drug abuse was started on heparin. Upper extremity Doppler to rule out DVT has been sent, he was started on IV heparin and antibiotics see below for the details. Patient is refusing Suboxone and demanding methadone.  Sepsis likely due to left upper extremity cellulitis: blood cultures 11/18/2022strep pyogenes. Surveillance blood cultures have been sent. Continue IV vancomycin and clindamycin, discontinue cefepime. The echo was done on 12/05/2020 that was concerning for tricuspid  and mitral valve endocarditis as well is an aortic root abscess Needs a TEE.  Cardiology has been notified. CT maxillofacial to rule out dental abscesses. We will go ahead and consult ID and CT surgery and infectious disease.  Cavitary lung lesion/pulmonary embolism: Blood cultures were obtained, started empirically on Vanco and  clindamycin.  Hyponatremia probably hypovolemic: Was started on insulin infusion recheck a basic metabolic panel in the morning.  Hepatitis C: Will need follow-up as an outpatient.  IV drug abuse: We will start her on oral methadone to avoid withdrawals.  She refused her Suboxone she says it makes her has pain and starts having nausea and vomiting.   Calm this morning we had a really good good conversation about the amount, frequency and the time she shoot up heroin in the hospital.  DVT prophylaxis: IV heparin Family Communication:none Status is: Inpatient  Remains inpatient appropriate because: Acute DVT with upper extremity cellulitis concern for endocarditis    Code Status:     Code Status Orders  (From admission, onward)           Start     Ordered   12/05/20 0620  Full code  Continuous        12/05/20 0621           Code Status History     Date Active Date Inactive Code Status Order ID Comments User Context   09/15/2019 1509 09/20/2019 2224 Full Code [id number]  Delice Bison, DO ED   08/11/2019 0147 08/13/2019 1529 Full Code [id number]  Mansy, Arvella Merles, MD ED   04/01/2019 1108 04/04/2019 0303 Full Code [id number]  Jean Rosenthal, MD ED   05/22/2018 0203 05/24/2018 1759 Full Code [id number]  Donnamae Jude, MD Inpatient   04/08/2017 0942 04/10/2017 2153 Full Code [id number]  Christin Fudge, Petronila Inpatient   04/08/2017 0729 04/08/2017 [place] Full Code [id number]  Christin Fudge, [place] Inpatient   11/07/2015 0120 11/08/2015 1723  Full Code [id number]  Calvert Cantor Inpatient   11/06/2015 1033 11/07/2015 0120 Full Code [id number]  Arabella Merles, CNM Inpatient         IV Access:   Peripheral IV   Procedures and diagnostic studies:   DG Chest 1 View  Result Date: 12/05/2020 CLINICAL DATA:  Chest pain. EXAM: CHEST  1 VIEW COMPARISON:  Chest radiograph dated 09/19/2019. FINDINGS: Diffuse interstitial nodularity may represent mild edema. Atypical  infection is not excluded clinical correlation is recommended no focal consolidation, pleural effusion or pneumothorax. The cardiac silhouette is within limits. Scoliosis. No acute osseous pathology. IMPRESSION: Diffuse interstitial nodularity may represent mild edema. Atypical infection is not excluded. No focal consolidation. Electronically Signed   By: Elgie Collard M.D.   On: 12/05/2020 00:19   CT Angio Chest PE W and/or Wo Contrast  Result Date: 12/05/2020 CLINICAL DATA:  Left chest pain, dyspnea.  PE suspected, high prob. EXAM: CT ANGIOGRAPHY CHEST WITH CONTRAST TECHNIQUE: Multidetector CT imaging of the chest was performed using the standard protocol during bolus administration of intravenous contrast. Multiplanar CT image reconstructions and MIPs were obtained to evaluate the vascular anatomy. CONTRAST:  65mL OMNIPAQUE IOHEXOL 350 MG/ML SOLN COMPARISON:  None. FINDINGS: Cardiovascular: Adequate opacification of the pulmonary arterial tree. No intraluminal filling defect identified to suggest acute pulmonary embolism. Central pulmonary arteries are of normal caliber. No significant coronary artery calcification. Cardiac size within normal limits. No pericardial effusion. No significant atherosclerotic calcification within the thoracic aorta. No aortic aneurysm. Mediastinum/Nodes: There is left axillary and subpectoral adenopathy. No mediastinal or hilar adenopathy identified. Thyroid unremarkable. Esophagus unremarkable. Lungs/Pleura: There is a cavitary, inflammatory appearing lesion within the inferior lingula which may represent the sequela of septic embolization or necrotizing pneumonia. An impacted airway is seen communicating with this region. A necrotic mass is considered less likely. The lungs are otherwise clear. No pneumothorax or pleural effusion. Upper Abdomen: No acute abnormality. Musculoskeletal: No chest wall abnormality. No acute or significant osseous findings. Subcutaneous edema is  noted within the visualized left upper extremity with inflammatory stranding noted within the axilla. Review of the MIP images confirms the above findings. IMPRESSION: Shotty left axillary and subpectoral adenopathy, possibly reactive in nature and related to an inflammatory process within the left upper extremity and axilla. No pulmonary embolism. Cavitary lesion within the basilar lingula in keeping with the sequela of septic embolization or necrotizing pneumonia. Follow-up evaluation to document resolution is recommended following conservative therapy. Electronically Signed   By: Helyn Numbers M.D.   On: 12/05/2020 03:27   ECHOCARDIOGRAM COMPLETE  Result Date: 12/05/2020    ECHOCARDIOGRAM REPORT   Patient Name:   SHAMECA LANDEN Date of Exam: 12/05/2020 Medical Rec #:  [id number]       Height:       61.0 in Accession #:    [id number]      Weight:       99.2 lb Date of Birth:  [birth year]       BSA:          1.402 m Patient Age:    35 years        BP:           84/52 mmHg Patient Gender: F               HR:           69 bpm. Exam Location:  Inpatient Procedure: 2D Echo, Cardiac Doppler and Color Doppler Indications:    Endocarditis  History:        Patient has prior history of Echocardiogram examinations, most                 recent 09/17/2020. Signs/Symptoms:Chest Pain.  Sonographer:    Glo Herring Referring Phys: [id number] TIMOTHY S OPYD  Sonographer Comments: Technically difficult due to patient being supine IMPRESSIONS  1. 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 Recommend TEE to better assess.  2. Left ventricular ejection fraction, by estimation, is 55 to 60%. The left ventricle has normal function. The left ventricle has no regional wall motion abnormalities. Left ventricular diastolic parameters were normal.  3. Right ventricular systolic function is normal. The right ventricular size is normal. There is normal pulmonary artery  systolic pressure.  4. The anterior mitral valve leaflet is thickened and there is a mobile density (1.37 x 0.82 cm) on the ventricular side of the anterior leaflet.. The mitral valve is normal in structure. Mild mitral valve regurgitation. No evidence of mitral stenosis.  5. There is thickening on the atrial side of the tricuspid valve septal leaflet. Measures 1.3 x 1.5 cm. Findings concerning for endocarditis.  6. The aortic valve is tricuspid. Aortic valve regurgitation is not visualized. No aortic stenosis is present.  7. The aortic root appears thickened. 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  8. The inferior vena cava is dilated in size with >50% respiratory variability, suggesting right atrial pressure of 8 mmHg. FINDINGS  Left Ventricle: Left ventricular ejection fraction, by estimation, is 55 to 60%. The left ventricle has normal function. The left ventricle has no regional wall motion abnormalities. The left ventricular internal cavity size was normal in size. There is  no left ventricular hypertrophy. Left ventricular diastolic parameters were normal. Normal left ventricular filling pressure. Right Ventricle: The right ventricular size is normal. No increase in right ventricular wall thickness. Right ventricular systolic function is normal. There is normal pulmonary artery systolic pressure. The tricuspid regurgitant velocity is 1.90 m/s, and  with an assumed right atrial pressure of 8 mmHg, the estimated right ventricular systolic pressure is [id number] mmHg. Left Atrium: Left atrial size was normal in size. Right Atrium: Right atrial size was normal in size. Pericardium: There is no evidence of pericardial effusion. Mitral Valve: The anterior mitral valve leaflet is thickened and there is a mobile density (1.37 x 0.82 cm) on the ventricular side of the anterior leaflet. The mitral valve is normal in structure. Mild mitral valve regurgitation. No evidence of mitral valve stenosis. Tricuspid Valve: There is  thickening on the atrial side of the tricuspid valve septal leaflet. Measures 1.3 x 1.5 cm. Findings concerning for endocarditis. The tricuspid valve is normal in structure. Tricuspid valve regurgitation is trivial. No evidence of tricuspid stenosis. Aortic Valve: The aortic valve is tricuspid. Aortic valve regurgitation is not visualized. No aortic stenosis is present. Pulmonic Valve: The pulmonic valve was normal in structure. Pulmonic valve regurgitation is not visualized. No evidence of pulmonic stenosis. Aorta: The aortic root appears thickened. 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 The aortic root is normal in size and structure. Venous: The inferior vena cava is dilated in size with greater than 50% respiratory variability, suggesting right atrial pressure of 8 mmHg. IAS/Shunts: No atrial level shunt detected by color flow Doppler.  LEFT VENTRICLE PLAX 2D LVIDd:         4.50 cm     Diastology LVIDs:  2.70 cm     LV e' medial:    10.90 cm/s LV PW:         1.10 cm     LV E/e' medial:  9.6 LV IVS:        0.60 cm     LV e' lateral:   9.90 cm/s LVOT diam:     1.90 cm     LV E/e' lateral: 10.6 LVOT Area:     2.84 cm  LV Volumes (MOD) LV vol d, MOD A2C: 58.1 ml LV vol d, MOD A4C: 64.3 ml LV vol s, MOD A2C: 28.4 ml LV vol s, MOD A4C: 28.8 ml LV SV MOD A2C:     29.8 ml LV SV MOD A4C:     64.3 ml LV SV MOD BP:      34.5 ml IVC IVC diam: 2.50 cm LEFT ATRIUM             Index LA diam:        3.20 cm 2.28 cm/m LA Vol (A2C):   36.0 ml 25.68 ml/m LA Vol (A4C):   35.0 ml 24.96 ml/m LA Biplane Vol: 38.6 ml 27.53 ml/m                        PULMONIC VALVE AORTA                 PV Vmax:       0.99 m/s Ao Root diam: 2.60 cm PV Peak grad:  3.9 mmHg  MITRAL VALVE                TRICUSPID VALVE MV Area (PHT): 4.80 cm     TR Peak grad:   14.4 mmHg MV Decel Time: 158 msec     TR Vmax:        190.00 cm/s MV E velocity: 105.00 cm/s MV A velocity: 47.10 cm/s   SHUNTS MV E/A ratio:  2.23         Systemic Diam: 1.90 cm Skeet Latch MD Electronically signed by Skeet Latch MD Signature Date/Time: 12/05/2020/3:05:49 PM    Final    CT Extrem Up Entire Arm L WO/W CM  Result Date: 12/05/2020 CLINICAL DATA:  Concern for cellulitis of the left upper extremity. EXAM: CT OF THE UPPER LEFT EXTREMITY WITHOUT AND WITH CONTRAST TECHNIQUE: Multidetector CT imaging of the upper left extremity was performed following the standard protocol before and during bolus administration of intravenous contrast. COMPARISON:  None. CONTRAST:  81mL OMNIPAQUE IOHEXOL 350 MG/ML SOLN FINDINGS: Bones/Joint/Cartilage There is no acute fracture or dislocation. The bones are well mineralized. No arthritic changes. No joint effusion. Ligaments Suboptimally assessed by CT. Muscles and Tendons There is intramuscular edema. Soft tissues There is diffuse subcutaneous edema of the left upper extremity consistent with cellulitis. No drainable fluid collection/abscess. There is a long segment thrombosed vein involving the medial left upper extremity extending from the distal forearm into the axilla consistent with long segment DVT. This appears to involve the brachial vein extending into the axillary vein. No drainable fluid collection/abscess. IMPRESSION: 1. Long segment venous thrombus extending into the left axillary vein at the junction of the subclavian vein consistent with DVT. Associated inflammatory changes indicative of thrombophlebitis. Further evaluation with dedicated duplex ultrasound recommended. 2. No drainable fluid collection/abscess. These results were called by telephone at the time of interpretation on 12/05/2020 at 3:18 am to provider [facility] , who verbally acknowledged these results. Electronically  Signed   By: Elgie Collard M.D.   On: 12/05/2020 03:23     Medical Consultants:   None.   Subjective:    Summer Cain   She relates her symptoms are significantly improved, still having fevers overnight  Objective:    Vitals:    12/06/20 0618 12/06/20 0759 12/06/20 0836 12/06/20 1503  BP:   91/67 97/68  Pulse:  69 69 64  Resp:  16 17 17   Temp:  (!) 97.4 F (36.3 C) 97.8 F (36.6 C) 98.4 F (36.9 C)  TempSrc:  Oral Oral Oral  SpO2:  100% 99% 98%  Weight: 49.3 kg     Height:       SpO2: 98 %   Intake/Output Summary (Last 24 hours) at 12/06/2020 1509 Last data filed at 12/06/2020 0636 Gross per 24 hour  Intake 2824 ml  Output --  Net 2824 ml    Filed Weights   12/04/20 2049 12/05/20 2008 12/06/20 0618  Weight: 45 kg 49.4 kg 49.3 kg    Exam: General exam: In no acute distress. Respiratory system: Good air movement and clear to auscultation. Cardiovascular system: S1 & S2 heard, no JVD systolic murmur Gastrointestinal system: Abdomen is nondistended, soft and nontender.  Extremities: No pedal edema. Skin: Of the arm is significantly improved no fluctuation. Psychiatry: Judgement and insight appear normal. Mood & affect appropriate.   Data Reviewed:    Labs: Basic Metabolic Panel: Recent Labs  Lab 12/04/20 2108  NA 127*  K 4.0  CL 91*  CO2 25  GLUCOSE 115*  BUN 14  CREATININE 0.72  CALCIUM 8.3*    GFR Estimated Creatinine Clearance: 71.8 mL/min (by C-G formula based on SCr of 0.72 mg/dL). Liver Function Tests: Recent Labs  Lab 12/04/20 2108  AST 20  ALT 17  ALKPHOS 44  BILITOT 0.7  PROT 6.7  ALBUMIN 2.6*    No results for input(s): LIPASE, AMYLASE in the last 168 hours. No results for input(s): AMMONIA in the last 168 hours. Coagulation profile Recent Labs  Lab 12/04/20 2108  INR 1.2    COVID-19 Labs  No results for input(s): DDIMER, FERRITIN, LDH, CRP in the last 72 hours.  Lab Results  Component Value Date   SARSCOV2NAA NEGATIVE 12/04/2020   SARSCOV2NAA NEGATIVE 09/15/2019   SARSCOV2NAA NEGATIVE 08/10/2019   SARSCOV2NAA NEGATIVE 04/03/2019    CBC: Recent Labs  Lab 12/04/20 2108  WBC 15.9*  NEUTROABS 12.5*  HGB 11.0*  HCT 32.8*  MCV 83.7  PLT  181    Cardiac Enzymes: No results for input(s): CKTOTAL, CKMB, CKMBINDEX, TROPONINI in the last 168 hours. BNP (last 3 results) No results for input(s): PROBNP in the last 8760 hours. CBG: No results for input(s): GLUCAP in the last 168 hours. D-Dimer: No results for input(s): DDIMER in the last 72 hours. Hgb A1c: No results for input(s): HGBA1C in the last 72 hours. Lipid Profile: No results for input(s): CHOL, HDL, LDLCALC, TRIG, CHOLHDL, LDLDIRECT in the last 72 hours. Thyroid function studies: No results for input(s): TSH, T4TOTAL, T3FREE, THYROIDAB in the last 72 hours.  Invalid input(s): FREET3 Anemia work up: No results for input(s): VITAMINB12, FOLATE, FERRITIN, TIBC, IRON, RETICCTPCT in the last 72 hours. Sepsis Labs: Recent Labs  Lab 12/04/20 2108 12/05/20 0927  PROCALCITON  --  0.52  WBC 15.9*  --   LATICACIDVEN 1.1  --     Microbiology Recent Results (from the past 240 hour(s))  Resp Panel by RT-PCR (Flu A&B,  Covid) Peripheral     Status: None   Collection Time: 12/04/20  8:52 PM   Specimen: Peripheral; Nasopharyngeal(NP) swabs in vial transport medium  Result Value Ref Range Status   SARS Coronavirus 2 by RT PCR NEGATIVE NEGATIVE Final    Comment: (NOTE) SARS-CoV-2 target nucleic acids are NOT DETECTED.  The SARS-CoV-2 RNA is generally detectable in upper respiratory specimens during the acute phase of infection. The lowest concentration of SARS-CoV-2 viral copies this assay can detect is 138 copies/mL. A negative result does not preclude SARS-Cov-2 infection and should not be used as the sole basis for treatment or other patient management decisions. A negative result may occur with  improper specimen collection/handling, submission of specimen other than nasopharyngeal swab, presence of viral mutation(s) within the areas targeted by this assay, and inadequate number of viral copies(<138 copies/mL). A negative result must be combined with clinical  observations, patient history, and epidemiological information. The expected result is Negative.  Fact Sheet for Patients:  EntrepreneurPulse.com.au  Fact Sheet for Healthcare Providers:  IncredibleEmployment.be  This test is no t yet approved or cleared by the [place] FDA and  has been authorized for detection and/or diagnosis of SARS-CoV-2 by FDA under an Emergency Use Authorization (EUA). This EUA will remain  in effect (meaning this test can be used) for the duration of the COVID-19 declaration under Section 564(b)(1) of the Act, 21 U.S.C.section 360bbb-3(b)(1), unless the authorization is terminated  or revoked sooner.       Influenza A by PCR NEGATIVE NEGATIVE Final   Influenza B by PCR NEGATIVE NEGATIVE Final    Comment: (NOTE) The Xpert Xpress SARS-CoV-2/FLU/RSV plus assay is intended as an aid in the diagnosis of influenza from Nasopharyngeal swab specimens and should not be used as a sole basis for treatment. Nasal washings and aspirates are unacceptable for Xpert Xpress SARS-CoV-2/FLU/RSV testing.  Fact Sheet for Patients: EntrepreneurPulse.com.au  Fact Sheet for Healthcare Providers: IncredibleEmployment.be  This test is not yet approved or cleared by the [place] FDA and has been authorized for detection and/or diagnosis of SARS-CoV-2 by FDA under an Emergency Use Authorization (EUA). This EUA will remain in effect (meaning this test can be used) for the duration of the COVID-19 declaration under Section 564(b)(1) of the Act, 21 U.S.C. section 360bbb-3(b)(1), unless the authorization is terminated or revoked.  Performed at [facility], [place] [address], [place], [place] [address]   Blood Culture (routine x 2)     Status: Abnormal (Preliminary result)   Collection Time: 12/04/20  9:08 PM   Specimen: BLOOD RIGHT HAND  Result Value Ref Range Status   Specimen Description  BLOOD RIGHT HAND  Final   Special Requests   Final    BOTTLES DRAWN AEROBIC AND ANAEROBIC Blood Culture adequate volume   Culture  Setup Time   Final    GRAM POSITIVE COCCI IN CHAINS IN PAIRS AEROBIC BOTTLE ONLY Organism ID to follow CRITICAL RESULT CALLED TO, READ BACK BY AND VERIFIED WITHJiles Garter [facility] PHARMD [id number] 12/05/20 A BROWNING    Culture (A)  Final    GROUP A STREP (S.PYOGENES) ISOLATED SUSCEPTIBILITIES TO FOLLOW Performed at [facility], [place] [address], [place], [place] [address]    Report Status PENDING  Incomplete  Blood Culture (routine x 2)     Status: Abnormal (Preliminary result)   Collection Time: 12/04/20  9:08 PM   Specimen: BLOOD RIGHT ARM  Result Value Ref Range Status   Specimen  Description BLOOD RIGHT ARM  Final   Special Requests   Final    BOTTLES DRAWN AEROBIC ONLY Blood Culture adequate volume   Culture  Setup Time   Final    GRAM POSITIVE COCCI IN CHAINS AEROBIC BOTTLE ONLY CRITICAL VALUE NOTED.  VALUE IS CONSISTENT WITH PREVIOUSLY REPORTED AND CALLED VALUE. Performed at [facility], [place] [address], [place], [place] [address]    Culture GROUP A STREP (S.PYOGENES) ISOLATED (A)  Final   Report Status PENDING  Incomplete  Blood Culture ID Panel (Reflexed)     Status: Abnormal   Collection Time: 12/04/20  9:08 PM  Result Value Ref Range Status   Enterococcus faecalis NOT DETECTED NOT DETECTED Final   Enterococcus Faecium NOT DETECTED NOT DETECTED Final   Listeria monocytogenes NOT DETECTED NOT DETECTED Final   Staphylococcus species NOT DETECTED NOT DETECTED Final   Staphylococcus aureus (BCID) NOT DETECTED NOT DETECTED Final   Staphylococcus epidermidis NOT DETECTED NOT DETECTED Final   Staphylococcus lugdunensis NOT DETECTED NOT DETECTED Final   Streptococcus species DETECTED (A) NOT DETECTED Final    Comment: CRITICAL RESULT CALLED TO, READ BACK BY AND VERIFIED WITHJiles Garter [facility] PHARMD [id number] 12/05/20 A BROWNING    Streptococcus  agalactiae NOT DETECTED NOT DETECTED Final   Streptococcus pneumoniae NOT DETECTED NOT DETECTED Final   Streptococcus pyogenes DETECTED (A) NOT DETECTED Final    Comment: CRITICAL RESULT CALLED TO, READ BACK BY AND VERIFIED WITHJiles Garter [facility] PHARMD [id number] 12/05/20 A BROWNING    A.calcoaceticus-baumannii NOT DETECTED NOT DETECTED Final   Bacteroides fragilis NOT DETECTED NOT DETECTED Final   Enterobacterales NOT DETECTED NOT DETECTED Final   Enterobacter cloacae complex NOT DETECTED NOT DETECTED Final   Escherichia coli NOT DETECTED NOT DETECTED Final   Klebsiella aerogenes NOT DETECTED NOT DETECTED Final   Klebsiella oxytoca NOT DETECTED NOT DETECTED Final   Klebsiella pneumoniae NOT DETECTED NOT DETECTED Final   Proteus species NOT DETECTED NOT DETECTED Final   Salmonella species NOT DETECTED NOT DETECTED Final   Serratia marcescens NOT DETECTED NOT DETECTED Final   Haemophilus influenzae NOT DETECTED NOT DETECTED Final   Neisseria meningitidis NOT DETECTED NOT DETECTED Final   Pseudomonas aeruginosa NOT DETECTED NOT DETECTED Final   Stenotrophomonas maltophilia NOT DETECTED NOT DETECTED Final   Candida albicans NOT DETECTED NOT DETECTED Final   Candida auris NOT DETECTED NOT DETECTED Final   Candida glabrata NOT DETECTED NOT DETECTED Final   Candida krusei NOT DETECTED NOT DETECTED Final   Candida parapsilosis NOT DETECTED NOT DETECTED Final   Candida tropicalis NOT DETECTED NOT DETECTED Final   Cryptococcus neoformans/gattii NOT DETECTED NOT DETECTED Final    Comment: Performed at [facility], [place]. [address], [place], [place] [address]     Medications:    enoxaparin (LOVENOX) injection  50 mg Subcutaneous Q12H   ketorolac  15 mg Intravenous Q6H   methadone  20 mg Oral Daily   pantoprazole  40 mg Oral Daily   Continuous Infusions:  clindamycin (CLEOCIN) IV 600 mg (12/06/20 0813)   penicillin g continuous IV infusion 12 Million Units (12/06/20 1036)      LOS: 1 day    Charlynne Cousins  [facility]  12/06/2020, 3:09 PM

## 2020-12-06 NOTE — Progress Notes (Signed)
ANTICOAGULATION CONSULT NOTE - Initial Consult  Pharmacy Consult for Heparin Indication: R/O DVT  Allergies  Allergen Reactions   Latex Swelling    Patient Measurements: Height: 5' (152.4 cm) Weight: 49.3 kg (108 lb 11 oz) IBW/kg (Calculated) : 45.5  Vital Signs: Temp: 97.8 F (36.6 C) (11/19 0836) Temp Source: Oral (11/19 0836) BP: 91/67 (11/19 0836) Pulse Rate: 69 (11/19 0836)  Labs: Recent Labs    12/04/20 2108 12/05/20 0130 12/05/20 0927 12/05/20 2046  HGB 11.0*  --   --   --   HCT 32.8*  --   --   --   PLT 181  --   --   --   APTT 39*  --   --   --   LABPROT 15.0  --   --   --   INR 1.2  --   --   --   HEPARINUNFRC  --   --  <0.10* <0.10*  CREATININE 0.72  --   --   --   TROPONINIHS 4 3  --   --      Estimated Creatinine Clearance: 71.8 mL/min (by C-G formula based on SCr of 0.72 mg/dL).   Medical History: Past Medical History:  Diagnosis Date   Pregnant    [place]     Medications:  No current facility-administered medications on file prior to encounter.   Current Outpatient Medications on File Prior to Encounter  Medication Sig Dispense Refill   QUEtiapine (SEROQUEL) 200 MG tablet Take 200 mg by mouth at bedtime.     traZODone (DESYREL) 100 MG tablet Take 50-100 mg by mouth See admin instructions. 100mg  at bedtime, may take an additional 50mg  as needed for sleep       Assessment: 35 y.o. female with LUE swelling and DVT of upper extremity on CT. Last heparin level on 11/18 undetectable <0.1. Lab was unable to be drawn x 3 and phlebotomy informed me that no labs can be drawn for 24 hours. After discussion with MD, patient will be switched to lovenox while lab monitoring can not be done. Will continue to follow plans for anticoagulation and possible procedures.  Heparin level: <0.10, sub-therapeutic   Goal of Therapy:  Heparin level 0.3-0.7 units/ml Monitor platelets by anticoagulation protocol: Yes   Plan:  Start enoxaparin 50 mg twice  daily Transition to heparin drip when patient can have labs drawn  Thank you for allowing pharmacy to participate in this patient's care.  Thank you for allowing pharmacy to participate in this patient's care.  34, PharmD PGY1 Pharmacy Resident 12/06/2020 11:00 AM

## 2020-12-06 NOTE — Progress Notes (Addendum)
[facility] PROGRESS NOTE    Progress Note  Summer Cain  [id number] DOB: 1985/07/30 DOA: 12/04/2020 PCP: Patient, No Pcp Per (Inactive)     Brief Narrative:   Summer Cain is an 35 y.o. female past medical history of IV drug abuse, opiate dependence hepatitis C with mitral valve vegetation on TTE July 2021, her cultures at that time were positive for Pseudomonas, MRSA and Serratia but unfortunately she left AMA comes into the ED for 1 week of left arm pain associated with swelling and redness she also has a lump on her chest wall and swollen lymph nodes.  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  12/08/2020 TEE   Assessment/Plan:   Acute deep vein thrombosis (DVT) of left upper extremity ([place]): Provoked likely due to IV drug abuse was started on heparin. Upper extremity Doppler to rule out DVT has been sent, he was started on IV heparin and antibiotics see below for the details. Patient is refusing Suboxone and demanding methadone.  Sepsis likely due to left upper extremity cellulitis: blood cultures 11/18/2022strep pyogenes. Surveillance blood cultures have been sent. Continue IV vancomycin and clindamycin, discontinue cefepime. The echo was done on 12/05/2020 that was concerning for tricuspid  and mitral valve endocarditis as well is an aortic root abscess Needs a TEE.  Cardiology has been notified. CT maxillofacial to rule out dental abscesses. We will go ahead and consult ID and CT surgery and infectious disease.  Cavitary lung lesion/pulmonary embolism: Blood cultures were obtained, started empirically on Vanco and  cefepime.  Hyponatremia probably hypovolemic: Was started on insulin infusion recheck a basic metabolic panel in the morning.  Hepatitis C: Will need follow-up as an outpatient.  IV drug abuse: We will start her on oral methadone to avoid withdrawals.  She refused her Suboxone she says it makes her has pain and starts having nausea and vomiting.   Calm this morning we had a really good good conversation about the amount, frequency and the time she shoot up heroin in the hospital.  DVT prophylaxis: IV heparin Family Communication:none Status is: Inpatient  Remains inpatient appropriate because: Acute DVT with upper extremity cellulitis concern for endocarditis    Code Status:     Code Status Orders  (From admission, onward)           Start     Ordered   12/05/20 0620  Full code  Continuous        12/05/20 0621           Code Status History     Date Active Date Inactive Code Status Order ID Comments User Context   09/15/2019 1509 09/20/2019 2224 Full Code [id number]  Delice Bison, DO ED   08/11/2019 0147 08/13/2019 1529 Full Code [id number]  Mansy, Arvella Merles, MD ED   04/01/2019 1108 04/04/2019 0303 Full Code [id number]  Jean Rosenthal, MD ED   05/22/2018 0203 05/24/2018 1759 Full Code [id number]  Donnamae Jude, MD Inpatient   04/08/2017 0942 04/10/2017 2153 Full Code [id number]  Christin Fudge, [place] Inpatient   04/08/2017 0729 04/08/2017 [place] Full Code [id number]  Christin Fudge, [place] Inpatient   11/07/2015 0120 11/08/2015 1723  Full Code [id number]  Calvert Cantor Inpatient   11/06/2015 1033 11/07/2015 0120 Full Code [id number]  Arabella Merles, CNM Inpatient         IV Access:   Peripheral IV   Procedures and diagnostic studies:   DG Chest 1 View  Result Date: 12/05/2020 CLINICAL DATA:  Chest pain. EXAM: CHEST  1 VIEW COMPARISON:  Chest radiograph dated 09/19/2019. FINDINGS: Diffuse interstitial nodularity may represent mild edema. Atypical  infection is not excluded clinical correlation is recommended no focal consolidation, pleural effusion or pneumothorax. The cardiac silhouette is within limits. Scoliosis. No acute osseous pathology. IMPRESSION: Diffuse interstitial nodularity may represent mild edema. Atypical infection is not excluded. No focal consolidation. Electronically Signed   By: Elgie Collard M.D.   On: 12/05/2020 00:19   CT Angio Chest PE W and/or Wo Contrast  Result Date: 12/05/2020 CLINICAL DATA:  Left chest pain, dyspnea.  PE suspected, high prob. EXAM: CT ANGIOGRAPHY CHEST WITH CONTRAST TECHNIQUE: Multidetector CT imaging of the chest was performed using the standard protocol during bolus administration of intravenous contrast. Multiplanar CT image reconstructions and MIPs were obtained to evaluate the vascular anatomy. CONTRAST:  65mL OMNIPAQUE IOHEXOL 350 MG/ML SOLN COMPARISON:  None. FINDINGS: Cardiovascular: Adequate opacification of the pulmonary arterial tree. No intraluminal filling defect identified to suggest acute pulmonary embolism. Central pulmonary arteries are of normal caliber. No significant coronary artery calcification. Cardiac size within normal limits. No pericardial effusion. No significant atherosclerotic calcification within the thoracic aorta. No aortic aneurysm. Mediastinum/Nodes: There is left axillary and subpectoral adenopathy. No mediastinal or hilar adenopathy identified. Thyroid unremarkable. Esophagus unremarkable. Lungs/Pleura: There is a cavitary, inflammatory appearing lesion within the inferior lingula which may represent the sequela of septic embolization or necrotizing pneumonia. An impacted airway is seen communicating with this region. A necrotic mass is considered less likely. The lungs are otherwise clear. No pneumothorax or pleural effusion. Upper Abdomen: No acute abnormality. Musculoskeletal: No chest wall abnormality. No acute or significant osseous findings. Subcutaneous edema is  noted within the visualized left upper extremity with inflammatory stranding noted within the axilla. Review of the MIP images confirms the above findings. IMPRESSION: Shotty left axillary and subpectoral adenopathy, possibly reactive in nature and related to an inflammatory process within the left upper extremity and axilla. No pulmonary embolism. Cavitary lesion within the basilar lingula in keeping with the sequela of septic embolization or necrotizing pneumonia. Follow-up evaluation to document resolution is recommended following conservative therapy. Electronically Signed   By: Helyn Numbers M.D.   On: 12/05/2020 03:27   ECHOCARDIOGRAM COMPLETE  Result Date: 12/05/2020    ECHOCARDIOGRAM REPORT   Patient Name:   SHAMECA LANDEN Date of Exam: 12/05/2020 Medical Rec #:  [id number]       Height:       61.0 in Accession #:    [id number]      Weight:       99.2 lb Date of Birth:  [birth year]       BSA:          1.402 m Patient Age:    35 years        BP:           84/52 mmHg Patient Gender: F               HR:           69 bpm. Exam Location:  Inpatient Procedure: 2D Echo, Cardiac Doppler and Color Doppler Indications:    Endocarditis  History:        Patient has prior history of Echocardiogram examinations, most                 recent 09/17/2020. Signs/Symptoms:Chest Pain.  Sonographer:    Glo Herring Referring Phys: [id number] TIMOTHY S OPYD  Sonographer Comments: Technically difficult due to patient being supine IMPRESSIONS  1. 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 Recommend TEE to better assess.  2. Left ventricular ejection fraction, by estimation, is 55 to 60%. The left ventricle has normal function. The left ventricle has no regional wall motion abnormalities. Left ventricular diastolic parameters were normal.  3. Right ventricular systolic function is normal. The right ventricular size is normal. There is normal pulmonary artery  systolic pressure.  4. The anterior mitral valve leaflet is thickened and there is a mobile density (1.37 x 0.82 cm) on the ventricular side of the anterior leaflet.. The mitral valve is normal in structure. Mild mitral valve regurgitation. No evidence of mitral stenosis.  5. There is thickening on the atrial side of the tricuspid valve septal leaflet. Measures 1.3 x 1.5 cm. Findings concerning for endocarditis.  6. The aortic valve is tricuspid. Aortic valve regurgitation is not visualized. No aortic stenosis is present.  7. The aortic root appears thickened. 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  8. The inferior vena cava is dilated in size with >50% respiratory variability, suggesting right atrial pressure of 8 mmHg. FINDINGS  Left Ventricle: Left ventricular ejection fraction, by estimation, is 55 to 60%. The left ventricle has normal function. The left ventricle has no regional wall motion abnormalities. The left ventricular internal cavity size was normal in size. There is  no left ventricular hypertrophy. Left ventricular diastolic parameters were normal. Normal left ventricular filling pressure. Right Ventricle: The right ventricular size is normal. No increase in right ventricular wall thickness. Right ventricular systolic function is normal. There is normal pulmonary artery systolic pressure. The tricuspid regurgitant velocity is 1.90 m/s, and  with an assumed right atrial pressure of 8 mmHg, the estimated right ventricular systolic pressure is [id number] mmHg. Left Atrium: Left atrial size was normal in size. Right Atrium: Right atrial size was normal in size. Pericardium: There is no evidence of pericardial effusion. Mitral Valve: The anterior mitral valve leaflet is thickened and there is a mobile density (1.37 x 0.82 cm) on the ventricular side of the anterior leaflet. The mitral valve is normal in structure. Mild mitral valve regurgitation. No evidence of mitral valve stenosis. Tricuspid Valve: There is  thickening on the atrial side of the tricuspid valve septal leaflet. Measures 1.3 x 1.5 cm. Findings concerning for endocarditis. The tricuspid valve is normal in structure. Tricuspid valve regurgitation is trivial. No evidence of tricuspid stenosis. Aortic Valve: The aortic valve is tricuspid. Aortic valve regurgitation is not visualized. No aortic stenosis is present. Pulmonic Valve: The pulmonic valve was normal in structure. Pulmonic valve regurgitation is not visualized. No evidence of pulmonic stenosis. Aorta: The aortic root appears thickened. 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 The aortic root is normal in size and structure. Venous: The inferior vena cava is dilated in size with greater than 50% respiratory variability, suggesting right atrial pressure of 8 mmHg. IAS/Shunts: No atrial level shunt detected by color flow Doppler.  LEFT VENTRICLE PLAX 2D LVIDd:         4.50 cm     Diastology LVIDs:  2.70 cm     LV e' medial:    10.90 cm/s LV PW:         1.10 cm     LV E/e' medial:  9.6 LV IVS:        0.60 cm     LV e' lateral:   9.90 cm/s LVOT diam:     1.90 cm     LV E/e' lateral: 10.6 LVOT Area:     2.84 cm  LV Volumes (MOD) LV vol d, MOD A2C: 58.1 ml LV vol d, MOD A4C: 64.3 ml LV vol s, MOD A2C: 28.4 ml LV vol s, MOD A4C: 28.8 ml LV SV MOD A2C:     29.8 ml LV SV MOD A4C:     64.3 ml LV SV MOD BP:      34.5 ml IVC IVC diam: 2.50 cm LEFT ATRIUM             Index LA diam:        3.20 cm 2.28 cm/m LA Vol (A2C):   36.0 ml 25.68 ml/m LA Vol (A4C):   35.0 ml 24.96 ml/m LA Biplane Vol: 38.6 ml 27.53 ml/m                        PULMONIC VALVE AORTA                 PV Vmax:       0.99 m/s Ao Root diam: 2.60 cm PV Peak grad:  3.9 mmHg  MITRAL VALVE                TRICUSPID VALVE MV Area (PHT): 4.80 cm     TR Peak grad:   14.4 mmHg MV Decel Time: 158 msec     TR Vmax:        190.00 cm/s MV E velocity: 105.00 cm/s MV A velocity: 47.10 cm/s   SHUNTS MV E/A ratio:  2.23         Systemic Diam: 1.90 cm Skeet Latch MD Electronically signed by Skeet Latch MD Signature Date/Time: 12/05/2020/3:05:49 PM    Final    CT Extrem Up Entire Arm L WO/W CM  Result Date: 12/05/2020 CLINICAL DATA:  Concern for cellulitis of the left upper extremity. EXAM: CT OF THE UPPER LEFT EXTREMITY WITHOUT AND WITH CONTRAST TECHNIQUE: Multidetector CT imaging of the upper left extremity was performed following the standard protocol before and during bolus administration of intravenous contrast. COMPARISON:  None. CONTRAST:  17mL OMNIPAQUE IOHEXOL 350 MG/ML SOLN FINDINGS: Bones/Joint/Cartilage There is no acute fracture or dislocation. The bones are well mineralized. No arthritic changes. No joint effusion. Ligaments Suboptimally assessed by CT. Muscles and Tendons There is intramuscular edema. Soft tissues There is diffuse subcutaneous edema of the left upper extremity consistent with cellulitis. No drainable fluid collection/abscess. There is a long segment thrombosed vein involving the medial left upper extremity extending from the distal forearm into the axilla consistent with long segment DVT. This appears to involve the brachial vein extending into the axillary vein. No drainable fluid collection/abscess. IMPRESSION: 1. Long segment venous thrombus extending into the left axillary vein at the junction of the subclavian vein consistent with DVT. Associated inflammatory changes indicative of thrombophlebitis. Further evaluation with dedicated duplex ultrasound recommended. 2. No drainable fluid collection/abscess. These results were called by telephone at the time of interpretation on 12/05/2020 at 3:18 am to provider [facility] , who verbally acknowledged these results. Electronically  Signed   By: Anner Crete M.D.   On: 12/05/2020 03:23     Medical Consultants:   None.   Subjective:    Summer Cain   She relates her symptoms are significantly improved, still having fevers overnight  Objective:    Vitals:    12/06/20 0314 12/06/20 0618 12/06/20 0759 12/06/20 0836  BP: (!) 85/52   91/67  Pulse: (!) 56  69 69  Resp: 16  16 17   Temp: (!) 97.3 F (36.3 C)  (!) 97.4 F (36.3 C) 97.8 F (36.6 C)  TempSrc: Oral  Oral Oral  SpO2: 98%  100% 99%  Weight:  49.3 kg    Height:       SpO2: 99 %   Intake/Output Summary (Last 24 hours) at 12/06/2020 0918 Last data filed at 12/06/2020 0636 Gross per 24 hour  Intake 3824.16 ml  Output --  Net 3824.16 ml    Filed Weights   12/04/20 2049 12/05/20 2008 12/06/20 0618  Weight: 45 kg 49.4 kg 49.3 kg    Exam: General exam: In no acute distress. Respiratory system: Good air movement and clear to auscultation. Cardiovascular system: S1 & S2 heard, no JVD systolic murmur Gastrointestinal system: Abdomen is nondistended, soft and nontender.  Extremities: No pedal edema. Skin: Of the arm is significantly improved no fluctuation. Psychiatry: Judgement and insight appear normal. Mood & affect appropriate.   Data Reviewed:    Labs: Basic Metabolic Panel: Recent Labs  Lab 12/04/20 2108  NA 127*  K 4.0  CL 91*  CO2 25  GLUCOSE 115*  BUN 14  CREATININE 0.72  CALCIUM 8.3*    GFR Estimated Creatinine Clearance: 71.8 mL/min (by C-G formula based on SCr of 0.72 mg/dL). Liver Function Tests: Recent Labs  Lab 12/04/20 2108  AST 20  ALT 17  ALKPHOS 44  BILITOT 0.7  PROT 6.7  ALBUMIN 2.6*    No results for input(s): LIPASE, AMYLASE in the last 168 hours. No results for input(s): AMMONIA in the last 168 hours. Coagulation profile Recent Labs  Lab 12/04/20 2108  INR 1.2    COVID-19 Labs  No results for input(s): DDIMER, FERRITIN, LDH, CRP in the last 72 hours.  Lab Results  Component Value Date   SARSCOV2NAA NEGATIVE 12/04/2020   SARSCOV2NAA NEGATIVE 09/15/2019   [place] NEGATIVE 08/10/2019   [place] NEGATIVE 04/03/2019    CBC: Recent Labs  Lab 12/04/20 2108  WBC 15.9*  NEUTROABS 12.5*  HGB 11.0*  HCT 32.8*   MCV 83.7  PLT 181    Cardiac Enzymes: No results for input(s): CKTOTAL, CKMB, CKMBINDEX, TROPONINI in the last 168 hours. BNP (last 3 results) No results for input(s): PROBNP in the last 8760 hours. CBG: No results for input(s): GLUCAP in the last 168 hours. D-Dimer: No results for input(s): DDIMER in the last 72 hours. Hgb A1c: No results for input(s): HGBA1C in the last 72 hours. Lipid Profile: No results for input(s): CHOL, HDL, LDLCALC, TRIG, CHOLHDL, LDLDIRECT in the last 72 hours. Thyroid function studies: No results for input(s): TSH, T4TOTAL, T3FREE, THYROIDAB in the last 72 hours.  Invalid input(s): FREET3 Anemia work up: No results for input(s): VITAMINB12, FOLATE, FERRITIN, TIBC, IRON, RETICCTPCT in the last 72 hours. Sepsis Labs: Recent Labs  Lab 12/04/20 2108 12/05/20 0927  PROCALCITON  --  0.52  WBC 15.9*  --   LATICACIDVEN 1.1  --     Microbiology Recent Results (from the past 240 hour(s))  Resp Panel by  RT-PCR (Flu A&B, Covid) Peripheral     Status: None   Collection Time: 12/04/20  8:52 PM   Specimen: Peripheral; Nasopharyngeal(NP) swabs in vial transport medium  Result Value Ref Range Status   SARS Coronavirus 2 by RT PCR NEGATIVE NEGATIVE Final    Comment: (NOTE) SARS-CoV-2 target nucleic acids are NOT DETECTED.  The SARS-CoV-2 RNA is generally detectable in upper respiratory specimens during the acute phase of infection. The lowest concentration of SARS-CoV-2 viral copies this assay can detect is 138 copies/mL. A negative result does not preclude SARS-Cov-2 infection and should not be used as the sole basis for treatment or other patient management decisions. A negative result may occur with  improper specimen collection/handling, submission of specimen other than nasopharyngeal swab, presence of viral mutation(s) within the areas targeted by this assay, and inadequate number of viral copies(<138 copies/mL). A negative result must be combined  with clinical observations, patient history, and epidemiological information. The expected result is Negative.  Fact Sheet for Patients:  EntrepreneurPulse.com.au  Fact Sheet for Healthcare Providers:  IncredibleEmployment.be  This test is no t yet approved or cleared by the [place] FDA and  has been authorized for detection and/or diagnosis of SARS-CoV-2 by FDA under an Emergency Use Authorization (EUA). This EUA will remain  in effect (meaning this test can be used) for the duration of the COVID-19 declaration under Section 564(b)(1) of the Act, 21 U.S.C.section 360bbb-3(b)(1), unless the authorization is terminated  or revoked sooner.       Influenza A by PCR NEGATIVE NEGATIVE Final   Influenza B by PCR NEGATIVE NEGATIVE Final    Comment: (NOTE) The Xpert Xpress SARS-CoV-2/FLU/RSV plus assay is intended as an aid in the diagnosis of influenza from Nasopharyngeal swab specimens and should not be used as a sole basis for treatment. Nasal washings and aspirates are unacceptable for Xpert Xpress SARS-CoV-2/FLU/RSV testing.  Fact Sheet for Patients: EntrepreneurPulse.com.au  Fact Sheet for Healthcare Providers: IncredibleEmployment.be  This test is not yet approved or cleared by the [place] FDA and has been authorized for detection and/or diagnosis of SARS-CoV-2 by FDA under an Emergency Use Authorization (EUA). This EUA will remain in effect (meaning this test can be used) for the duration of the COVID-19 declaration under Section 564(b)(1) of the Act, 21 U.S.C. section 360bbb-3(b)(1), unless the authorization is terminated or revoked.  Performed at [facility], [place] [address] [place], [place] [address]   Blood Culture (routine x 2)     Status: Abnormal (Preliminary result)   Collection Time: 12/04/20  9:08 PM   Specimen: BLOOD RIGHT HAND  Result Value Ref Range Status   Specimen  Description BLOOD RIGHT HAND  Final   Special Requests   Final    BOTTLES DRAWN AEROBIC AND ANAEROBIC Blood Culture adequate volume   Culture  Setup Time   Final    GRAM POSITIVE COCCI IN CHAINS IN PAIRS AEROBIC BOTTLE ONLY Organism ID to follow CRITICAL RESULT CALLED TO, READ BACK BY AND VERIFIED WITHJiles Garter [facility] PHARMD [id number] 12/05/20 A BROWNING    Culture (A)  Final    GROUP A STREP (S.PYOGENES) ISOLATED SUSCEPTIBILITIES TO FOLLOW Performed at [facility], [place] [address], [place], [place] [address]    Report Status PENDING  Incomplete  Blood Culture (routine x 2)     Status: None (Preliminary result)   Collection Time: 12/04/20  9:08 PM   Specimen: BLOOD RIGHT ARM  Result Value Ref Range Status  Specimen Description BLOOD RIGHT ARM  Final   Special Requests   Final    BOTTLES DRAWN AEROBIC ONLY Blood Culture adequate volume   Culture  Setup Time   Final    GRAM POSITIVE COCCI IN CHAINS AEROBIC BOTTLE ONLY CRITICAL VALUE NOTED.  VALUE IS CONSISTENT WITH PREVIOUSLY REPORTED AND CALLED VALUE. Performed at [facility], [place] [address], [place], [place] [address]    Culture GRAM POSITIVE COCCI  Final   Report Status PENDING  Incomplete  Blood Culture ID Panel (Reflexed)     Status: Abnormal   Collection Time: 12/04/20  9:08 PM  Result Value Ref Range Status   Enterococcus faecalis NOT DETECTED NOT DETECTED Final   Enterococcus Faecium NOT DETECTED NOT DETECTED Final   Listeria monocytogenes NOT DETECTED NOT DETECTED Final   Staphylococcus species NOT DETECTED NOT DETECTED Final   Staphylococcus aureus (BCID) NOT DETECTED NOT DETECTED Final   Staphylococcus epidermidis NOT DETECTED NOT DETECTED Final   Staphylococcus lugdunensis NOT DETECTED NOT DETECTED Final   Streptococcus species DETECTED (A) NOT DETECTED Final    Comment: CRITICAL RESULT CALLED TO, READ BACK BY AND VERIFIED WITHJiles Garter [facility] PHARMD [id number] 12/05/20 A BROWNING    Streptococcus agalactiae NOT  DETECTED NOT DETECTED Final   Streptococcus pneumoniae NOT DETECTED NOT DETECTED Final   Streptococcus pyogenes DETECTED (A) NOT DETECTED Final    Comment: CRITICAL RESULT CALLED TO, READ BACK BY AND VERIFIED WITHJiles Garter [facility] PHARMD 1656 12/05/20 A BROWNING    A.calcoaceticus-baumannii NOT DETECTED NOT DETECTED Final   Bacteroides fragilis NOT DETECTED NOT DETECTED Final   Enterobacterales NOT DETECTED NOT DETECTED Final   Enterobacter cloacae complex NOT DETECTED NOT DETECTED Final   Escherichia coli NOT DETECTED NOT DETECTED Final   Klebsiella aerogenes NOT DETECTED NOT DETECTED Final   Klebsiella oxytoca NOT DETECTED NOT DETECTED Final   Klebsiella pneumoniae NOT DETECTED NOT DETECTED Final   Proteus species NOT DETECTED NOT DETECTED Final   Salmonella species NOT DETECTED NOT DETECTED Final   Serratia marcescens NOT DETECTED NOT DETECTED Final   Haemophilus influenzae NOT DETECTED NOT DETECTED Final   Neisseria meningitidis NOT DETECTED NOT DETECTED Final   Pseudomonas aeruginosa NOT DETECTED NOT DETECTED Final   Stenotrophomonas maltophilia NOT DETECTED NOT DETECTED Final   Candida albicans NOT DETECTED NOT DETECTED Final   Candida auris NOT DETECTED NOT DETECTED Final   Candida glabrata NOT DETECTED NOT DETECTED Final   Candida krusei NOT DETECTED NOT DETECTED Final   Candida parapsilosis NOT DETECTED NOT DETECTED Final   Candida tropicalis NOT DETECTED NOT DETECTED Final   Cryptococcus neoformans/gattii NOT DETECTED NOT DETECTED Final    Comment: Performed at [facility], [place]. [address] [place], [place] [address]     Medications:    buprenorphine-naloxone  1 tablet Sublingual BID   ketorolac  15 mg Intravenous Q6H   pantoprazole  40 mg Oral Daily   Continuous Infusions:  clindamycin (CLEOCIN) IV 600 mg (12/06/20 0813)   heparin 950 Units/hr (12/05/20 2159)   penicillin g continuous IV infusion 12 Million Units (12/05/20 2227)      LOS: 1 day   Charlynne Cousins  [facility]  12/06/2020, 9:18 AM

## 2020-12-06 NOTE — Progress Notes (Signed)
   12/05/20 2112  Vitals  BP (!) 84/56  MAP (mmHg) 66  BP Location Right Arm  BP Method Automatic  Patient Position (if appropriate) Lying  Pulse Rate 69  Pulse Rate Source Monitor  ECG Heart Rate 75  Resp 14  Level of Consciousness  Level of Consciousness Alert  MEWS COLOR  MEWS Score Color Green  Oxygen Therapy  SpO2 99 %  O2 Device Room Air  Pain Assessment  Pain Scale 0-10  Pain Score 4  MEWS Score  MEWS Temp 0  MEWS Systolic 1  MEWS Pulse 0  MEWS RR 0  MEWS LOC 0  MEWS Score 1  Provider Notification  Provider Name/Title [email or web address]  Date Provider Notified 12/05/20  Time Provider Notified 2121  Notification Type Page  Notification Reason Change in status  Provider response See new orders  Date of Provider Response 12/05/20  Time of Provider Response 2200  Text page Dr. Leafy Half See new orders

## 2020-12-06 NOTE — Progress Notes (Signed)
Refuses to have blood drawn at this time . Lab will return at 6 am to try then.

## 2020-12-06 NOTE — Progress Notes (Signed)
Pharm. Is aware unable to get blood.

## 2020-12-06 NOTE — Consult Note (Addendum)
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   #Strep pyogenes bacteremia with native mitral and tricuspid valve IE + aortic root abscess with emboli to LUE and lung  -CT LUE showed DVT along axilla, CT chest showed cavitary lesion in lingula -CTS consulted, TEE ordered and orthopantogram ordered. -Pt has been reporting upper back pain as such will obtain imaging as she is high risk for vertebral involvement -Pt injects in multiple locations and told me today her symptoms of SHOB have been going on for a few weeks. About a week after when she last injected.   #Likely chronic HCV -Obtain Hep serologies and HCV VL  #IVDA with fentanyl -Plan is to start pt on oral methadone for her withdrawals  Recommendations:  -Follow repeat blood Cx(ordered 11/19) -MRI cervical and thoracix spine -Continue Penicillin -Complete clindamycin to complete 3 days of therapy - HCV VL, Hep B(Ag, Ab, cAb), HAV - RPR, GC oral and urine    Microbiology:   Antibiotics: Ceftriaxone 11/17 Vancomycin 11/17 Clindamycin 11/18-p Cefepime 11/18 Penicillin  11/8-p   Cultures: 11/17 Blood Cx 2/2 Strep Pyogenes 11/19 Cx pending  HPI: Summer Cain is a 35 y.o. female IVDA(last injected fentanyl 1 month ago), admitted 7/23-26 for polymicrobial bacteremia with MRSA serratia and PsA and presumed mitral valve endocarditis on TTE left AMA, chronic HCV, presented with left arm redness/pain and chest pain with SHOB. She had developed symptoms a few days prior. In the ED pt was afebrile, tachycardic, wbc 15.9K. CTA chest negative for PE but showed cavitary lung lesion and LUE DVT. CT of left upper extremity showed long segment venous thrombus from left axillary vein consistent with DVT.Blood Cx from admission + strep pyogenes and pt placed on penicillin and clindamycin.  TTE showed mobile density on  mitral and tricuspid valve as well as concern for aortic root abscess. Cardiothoracic surgery consulted.recommend TEE and orthopantogram. Today, pt reports upper back pain that began recently. She  reports her symptoms of SHOB and chest pain started about 3 weeks ago. She reports that she was sober then relapsed about a month ago with IVDA and now sniffs fentanyl. She states she is going through withdrawals and would like methadone.  Review of Systems: ROS  Past Medical History:  Diagnosis Date   Pregnant    Scoliosis     Social History   Tobacco Use   Smoking status: Light Smoker    Types: Cigarettes   Smokeless tobacco: Never   Tobacco comments:    uses vape pen primarily now  Substance Use Topics   Alcohol use: Yes    Comment: not since  found out was pregnant at 2 mths.   Drug use: Yes    Types: IV    Family History  Problem Relation Age of Onset   Hypertension Mother    Depression Mother    Hyperlipidemia Father    Diabetes Paternal Grandmother    Scheduled Meds:  enoxaparin (LOVENOX) injection  50 mg Subcutaneous Q12H   ketorolac  15 mg Intravenous Q6H   methadone  20 mg Oral Daily   pantoprazole  40 mg Oral Daily   Continuous  Infusions:  clindamycin (CLEOCIN) IV 600 mg (12/06/20 1614)   penicillin g continuous IV infusion 12 Million Units (12/06/20 1036)   PRN Meds:.acetaminophen **OR** acetaminophen, HYDROmorphone, loperamide, ondansetron **OR** ondansetron (ZOFRAN) IV, oxyCODONE Allergies  Allergen Reactions   Latex Swelling    OBJECTIVE: Blood pressure (!) 87/56, pulse 91, temperature 98.5 F (36.9 C), temperature source Oral, resp. rate 15, height 5' (1.524 m), weight 49.3 kg, SpO2 99 %.  Physical Exam Constitutional:      Comments: Pt is complaining of withdrawing, anxious.  HENT:     Head: Normocephalic and atraumatic.     Nose: Nose normal.     Mouth/Throat:     Mouth: Mucous membranes are moist.  Eyes:     Extraocular Movements: Extraocular movements intact.     Conjunctiva/sclera: Conjunctivae normal.     Pupils: Pupils are equal, round, and reactive to light.  Cardiovascular:     Rate and Rhythm: Normal rate and regular rhythm.     Heart sounds: No murmur heard.   No friction rub. No gallop.  Pulmonary:     Effort: Pulmonary effort is normal.     Breath sounds: Normal breath sounds.  Abdominal:     General: Abdomen is flat.     Palpations: Abdomen is soft.  Musculoskeletal:        General: Normal range of motion.     Comments: Upper back is tender to palpation.  Skin:    General: Skin is warm and dry.     Comments: Left UE lesion, scabs throughout.  Neurological:     General: No focal deficit present.     Mental Status: She is alert and oriented to person, place, and time.  Psychiatric:        Mood and Affect: Mood normal.    Lab Results Lab Results  Component Value Date   WBC 15.9 (H) 12/04/2020   HGB 11.0 (L) 12/04/2020   HCT 32.8 (L) 12/04/2020   MCV 83.7 12/04/2020   PLT 181 12/04/2020    Lab Results  Component Value Date   CREATININE 0.72 12/04/2020   BUN 14 12/04/2020   NA 127 (L) 12/04/2020   K 4.0 12/04/2020   CL 91 (L) 12/04/2020   CO2 25 12/04/2020     Lab Results  Component Value Date   ALT 17 12/04/2020   AST 20 12/04/2020   ALKPHOS 44 12/04/2020   BILITOT 0.7 12/04/2020       Danelle Earthly, MD [facility] 12/06/2020, 6:12 PM

## 2020-12-07 ENCOUNTER — Inpatient Hospital Stay (HOSPITAL_COMMUNITY): Payer: Medicaid Other

## 2020-12-07 DIAGNOSIS — I82622 Acute embolism and thrombosis of deep veins of left upper extremity: Secondary | ICD-10-CM | POA: Diagnosis not present

## 2020-12-07 DIAGNOSIS — F1990 Other psychoactive substance use, unspecified, uncomplicated: Secondary | ICD-10-CM | POA: Diagnosis not present

## 2020-12-07 DIAGNOSIS — I82A12 Acute embolism and thrombosis of left axillary vein: Secondary | ICD-10-CM | POA: Diagnosis not present

## 2020-12-07 DIAGNOSIS — E871 Hypo-osmolality and hyponatremia: Secondary | ICD-10-CM | POA: Diagnosis not present

## 2020-12-07 LAB — BASIC METABOLIC PANEL
Anion gap: 6 (ref 5–15)
BUN: 7 mg/dL (ref 6–20)
CO2: 23 mmol/L (ref 22–32)
Calcium: 7.7 mg/dL — ABNORMAL LOW (ref 8.9–10.3)
Chloride: 107 mmol/L (ref 98–111)
Creatinine, Ser: 0.59 mg/dL (ref 0.44–1.00)
GFR, Estimated: >60 mL/min (ref >60)
Glucose, Bld: 90 mg/dL (ref 70–99)
Potassium: 4.2 mmol/L (ref 3.5–5.1)
Sodium: 136 mmol/L (ref 135–145)

## 2020-12-07 LAB — LEGIONELLA PNEUMOPHILA SEROGP 1 UR AG: L. pneumophila Serogp 1 Ur Ag: NEGATIVE

## 2020-12-07 LAB — CBC
HCT: 32.9 % — ABNORMAL LOW (ref 36.0–46.0)
Hemoglobin: 10.5 g/dL — ABNORMAL LOW (ref 12.0–15.0)
MCH: 27.1 pg (ref 26.0–34.0)
MCHC: 31.9 g/dL (ref 30.0–36.0)
MCV: 85 fL (ref 80.0–100.0)
Platelets: 188 10*3/uL (ref 150–400)
RBC: 3.87 MIL/uL (ref 3.87–5.11)
RDW: 14.6 % (ref 11.5–15.5)
WBC: 7.8 10*3/uL (ref 4.0–10.5)
nRBC: 0 % (ref 0.0–0.2)

## 2020-12-07 LAB — HEPATITIS A ANTIBODY, TOTAL: hep A Total Ab: NONREACTIVE

## 2020-12-07 LAB — HEPARIN LEVEL (UNFRACTIONATED): Heparin Unfractionated: <0.1 IU/mL — ABNORMAL LOW (ref 0.30–0.70)

## 2020-12-07 LAB — HEPATITIS B CORE ANTIBODY, TOTAL: Hep B Core Total Ab: NONREACTIVE

## 2020-12-07 LAB — ALBUMIN: Albumin: 1.8 g/dL — ABNORMAL LOW (ref 3.5–5.0)

## 2020-12-07 MED ORDER — HYDROMORPHONE HCL 2 MG/ML IJ SOLN: 4.0000 mg | Freq: Once | INTRAMUSCULAR | Status: DC

## 2020-12-07 MED ORDER — HYDROMORPHONE HCL 1 MG/ML IJ SOLN
4.0000 mg | Freq: Once | INTRAMUSCULAR | Status: AC
  Administered 2020-12-07: 4 mg via INTRAVENOUS
  Filled 2020-12-07: qty 4

## 2020-12-07 NOTE — Progress Notes (Signed)
Patient scheduled for MRI. 1x dose of 4mg  dilaudid given to help patient lay on back for test. Patient agreed to go to MRI. RN then received a call from MRI that patient was yelling and stating she cannot lay on her back and refused MRI. RN attempted to talk to patient about importance of this test and urged her to get it done. Patient has continued to refuse. MD made aware. , RN

## 2020-12-07 NOTE — Progress Notes (Signed)
      [address]       [place] [address]             [phone]    Subjective:  No new complaints.  Unhappy about possibly weaning off Dilaudid.  Objective: Vital signs in last 24 hours: Temp:  [98 F (36.7 C)-98.6 F (37 C)] 98 F (36.7 C) (11/20 1055) Pulse Rate:  [67-91] 88 (11/20 1055) Cardiac Rhythm: Normal sinus rhythm (11/20 0700) Resp:  [15-19] 17 (11/20 1055) BP: (87-109)/(56-90) 109/66 (11/20 1055) SpO2:  [96 %-99 %] 98 % (11/20 1055) Weight:  [50.8 kg] 50.8 kg (11/20 0500)  Intake/Output from previous day: 11/19 0701 - 11/20 0700 In: 458 [P.O.:358; IV Piggyback:100] Out: -   General appearance: alert and no distress Heart: regular rate and rhythm Lungs: clear to auscultation bilaterally Extremities: multiple excoriations present  Lab Results: Recent Labs    12/04/20 2108 12/07/20 0126  WBC 15.9* 7.8  HGB 11.0* 10.5*  HCT 32.8* 32.9*  PLT 181 188   BMET:  Recent Labs    12/04/20 2108 12/07/20 0126  NA 127* 136  K 4.0 4.2  CL 91* 107  CO2 25 23  GLUCOSE 115* 90  BUN 14 7  CREATININE 0.72 0.59  CALCIUM 8.3* 7.7*    PT/INR:  Recent Labs    12/04/20 2108  LABPROT 15.0  INR 1.2   ABG No results found for: PHART, HCO3, TCO2, ACIDBASEDEF, O2SAT CBG (last 3)  No results for input(s): GLUCAP in the last 72 hours.  Assessment/Plan: S/P Procedure(s) (LRB): TRANSESOPHAGEAL ECHOCARDIOGRAM (TEE) (N/A)  Suspected MV, TV Endocarditis, ? Aortic Root Abscess?- awaiting TEE and dental evaluation... continue ABX per primary   LOS: 2 days   Lowella Dandy, PA-C 12/07/2020

## 2020-12-07 NOTE — Progress Notes (Signed)
[facility] PROGRESS NOTE    Progress Note  Nijah Swigart  [id number] DOB: [birth year] DOA: 12/04/2020 PCP: Patient, No Pcp Per (Inactive)     Brief Narrative:   Juleigh Kapla is an 35 y.o. female past medical history of IV drug abuse, opiate dependence hepatitis C with mitral valve vegetation on TTE July 2021, her cultures at that time were positive for Pseudomonas, MRSA and Serratia but unfortunately she left AMA comes into the ED for 1 week of left arm pain associated with swelling and redness she also has a lump on her chest wall and swollen lymph nodes.  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  12/08/2020 TEE   Assessment/Plan:   Acute deep vein thrombosis (DVT) of left upper extremity ([place]): Provoked likely due to IV drug abuse was started on heparin. Upper extremity Doppler to rule out DVT has been sent, he was started on IV heparin and antibiotics see below for the details. Currently on methadone and Dilaudid.  Sepsis likely due to left upper extremity cellulitis: blood cultures 11/18/2022strep pyogenes. Surveillance blood cultures have been sent. Continue IV vancomycin and clindamycin, has remained afebrile with no leukocytosis. TEE scheduled for 12/08/2020. CT maxillofacial no acute findings. Surveillance blood cultures on 12/07/2018 are pending. Currently on Suboxone and Oxy. CT surgery has been notified they recommended a TEE cannot appreciate aortic root abscess. Infectious disease was consulted recommended an MRI of the cervical and lumbar spine.  He also recommended to continue penicillins and 3 days of IV  clindamycin.  Cavitary lung lesion/pulmonary embolism: Blood cultures were obtained, started empirically on Vanco and clindamycin.  Hyponatremia probably hypovolemic: Resolved with IV fluid hydration.  Hepatitis C: Will need follow-up as an outpatient.  IV drug abuse: Continue methadone and hydromorphone, I have explained to her that we will wean her down over IV Dilaudid which she did not take well.  Not in a good mood this morning.  DVT prophylaxis: IV heparin Family Communication:none Status is: Inpatient  Remains inpatient appropriate because: Acute DVT with upper extremity cellulitis concern for endocarditis    Code Status:     Code Status Orders  (From admission, onward)           Start     Ordered   12/05/20 0620  Full code  Continuous        12/05/20 0621           Code Status History     Date Active Date Inactive Code Status Order ID Comments User Context   09/15/2019 1509 09/20/2019 2224 Full Code [id number]  Delice Bison, DO ED   08/11/2019 0147 08/13/2019 1529 Full Code [id number]  Mansy, Arvella Merles, MD ED   04/01/2019 1108 04/04/2019 0303 Full Code [id number]  Jean Rosenthal, MD ED   05/22/2018 0203 05/24/2018 1759 Full Code [id number]  Donnamae Jude, MD Inpatient   04/08/2017 0942 04/10/2017 2153 Full Code [id number]  Christin Fudge, [place] Inpatient   04/08/2017 0729 04/08/2017 [place] Full Code [id number]  Christin Fudge, [place] Inpatient   11/07/2015 0120 11/08/2015 1723 Full Code [id number]  Darlina Rumpf Inpatient   11/06/2015 1033 11/07/2015 0120 Full Code [id number]  Myrtis Ser, CNM Inpatient         IV Access:   Peripheral IV   Procedures and diagnostic studies:   CT MAXILLOFACIAL W CONTRAST  Result Date: 12/06/2020 CLINICAL DATA:  Facial swelling EXAM: CT MAXILLOFACIAL WITH CONTRAST TECHNIQUE: Multidetector CT imaging of the maxillofacial structures was performed with intravenous contrast. Multiplanar CT image  reconstructions were also generated. CONTRAST:  52mL OMNIPAQUE IOHEXOL 300 MG/ML  SOLN COMPARISON:  None. FINDINGS: Osseous: Is Orbits: The globes are intact. Normal appearance of the intra- and extraconal fat. Symmetric extraocular muscles. Sinuses: No fluid levels or advanced mucosal thickening. Soft tissues: Normal visualized extracranial soft tissues. Limited intracranial: Normal. Other: None. IMPRESSION: Normal maxillofacial CT. Electronically Signed   By: Ulyses Jarred M.D.   On: 12/06/2020 21:31   ECHOCARDIOGRAM COMPLETE  Result Date: 12/05/2020    ECHOCARDIOGRAM REPORT   Patient Name:   IRIDIANA PILSON Date of Exam: 12/05/2020 Medical Rec #:  [id number]       Height:       61.0 in Accession #:    [id number]      Weight:       99.2 lb Date of Birth:  [birth year]       BSA:          1.402 m Patient Age:    35 years        BP:           84/52 mmHg Patient Gender: F               HR:           69 bpm. Exam Location:  Inpatient Procedure: 2D Echo, Cardiac Doppler and Color Doppler Indications:    Endocarditis  History:        Patient has prior history of Echocardiogram examinations, most                 recent 09/17/2020. Signs/Symptoms:Chest Pain.  Sonographer:    Glo Herring Referring Phys: [id number] TIMOTHY S OPYD  Sonographer Comments: Technically difficult due to patient being supine IMPRESSIONS  1. 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 Recommend TEE to better assess.  2. Left ventricular ejection fraction, by estimation, is 55 to 60%. The left ventricle has normal function. The left ventricle has no regional wall motion abnormalities. Left ventricular diastolic parameters were normal.  3. Right ventricular systolic function is normal. The right ventricular size is normal. There is normal pulmonary artery systolic pressure.  4. The anterior mitral valve leaflet is thickened and there is a mobile density (1.37 x 0.82 cm) on the  ventricular side of the anterior leaflet.. The mitral valve is normal in structure. Mild mitral valve regurgitation. No evidence of mitral stenosis.  5. There is thickening on the atrial side of the tricuspid valve septal leaflet. Measures 1.3 x 1.5 cm. Findings concerning for endocarditis.  6. The aortic valve is tricuspid. Aortic valve regurgitation is not visualized. No aortic stenosis is present.  7. The aortic root appears thickened. 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  8. The inferior vena cava is dilated in size with >50% respiratory variability, suggesting right atrial pressure of 8 mmHg. FINDINGS  Left Ventricle: Left ventricular ejection fraction, by estimation, is 55 to 60%. The left ventricle has normal function. The left ventricle has no regional wall motion abnormalities. The left ventricular internal cavity size was normal in size. There is  no left ventricular  hypertrophy. Left ventricular diastolic parameters were normal. Normal left ventricular filling pressure. Right Ventricle: The right ventricular size is normal. No increase in right ventricular wall thickness. Right ventricular systolic function is normal. There is normal pulmonary artery systolic pressure. The tricuspid regurgitant velocity is 1.90 m/s, and  with an assumed right atrial pressure of 8 mmHg, the estimated right ventricular systolic pressure is [id number] mmHg. Left Atrium: Left atrial size was normal in size. Right Atrium: Right atrial size was normal in size. Pericardium: There is no evidence of pericardial effusion. Mitral Valve: The anterior mitral valve leaflet is thickened and there is a mobile density (1.37 x 0.82 cm) on the ventricular side of the anterior leaflet. The mitral valve is normal in structure. Mild mitral valve regurgitation. No evidence of mitral valve stenosis. Tricuspid Valve: There is thickening on the atrial side of the tricuspid valve septal leaflet. Measures 1.3 x 1.5 cm. Findings concerning for endocarditis.  The tricuspid valve is normal in structure. Tricuspid valve regurgitation is trivial. No evidence of tricuspid stenosis. Aortic Valve: The aortic valve is tricuspid. Aortic valve regurgitation is not visualized. No aortic stenosis is present. Pulmonic Valve: The pulmonic valve was normal in structure. Pulmonic valve regurgitation is not visualized. No evidence of pulmonic stenosis. Aorta: The aortic root appears thickened. 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 The aortic root is normal in size and structure. Venous: The inferior vena cava is dilated in size with greater than 50% respiratory variability, suggesting right atrial pressure of 8 mmHg. IAS/Shunts: No atrial level shunt detected by color flow Doppler.  LEFT VENTRICLE PLAX 2D LVIDd:         4.50 cm     Diastology LVIDs:         2.70 cm     LV e' medial:    10.90 cm/s LV PW:         1.10 cm     LV E/e' medial:  9.6 LV IVS:        0.60 cm     LV e' lateral:   9.90 cm/s LVOT diam:     1.90 cm     LV E/e' lateral: 10.6 LVOT Area:     2.84 cm  LV Volumes (MOD) LV vol d, MOD A2C: 58.1 ml LV vol d, MOD A4C: 64.3 ml LV vol s, MOD A2C: 28.4 ml LV vol s, MOD A4C: 28.8 ml LV SV MOD A2C:     29.8 ml LV SV MOD A4C:     64.3 ml LV SV MOD BP:      34.5 ml IVC IVC diam: 2.50 cm LEFT ATRIUM             Index LA diam:        3.20 cm 2.28 cm/m LA Vol (A2C):   36.0 ml 25.68 ml/m LA Vol (A4C):   35.0 ml 24.96 ml/m LA Biplane Vol: 38.6 ml 27.53 ml/m                        PULMONIC VALVE AORTA                 PV Vmax:       0.99 m/s Ao Root diam: 2.60 cm PV Peak grad:  3.9 mmHg  MITRAL VALVE                TRICUSPID VALVE MV Area (PHT): 4.80 cm     TR Peak grad:   14.4 mmHg  MV Decel Time: 158 msec     TR Vmax:        190.00 cm/s MV E velocity: 105.00 cm/s MV A velocity: 47.10 cm/s   SHUNTS MV E/A ratio:  2.23         Systemic Diam: 1.90 cm Chilton Si MD Electronically signed by Chilton Si MD Signature Date/Time: 12/05/2020/3:05:49 PM    Final      Medical  Consultants:   None.   Subjective:    Trinaty Coon not in a good mood this morning complaining of hurting all over.  Objective:    Vitals:   12/06/20 2015 12/06/20 2317 12/07/20 0500 12/07/20 0540  BP: 102/90 105/70  90/65  Pulse: 67 80    Resp: 19 19  18   Temp: 98.4 F (36.9 C) 98.6 F (37 C)  98.5 F (36.9 C)  TempSrc: Oral Oral  Oral  SpO2: 96% 99%  97%  Weight:   50.8 kg   Height:       SpO2: 97 %   Intake/Output Summary (Last 24 hours) at 12/07/2020 0923 Last data filed at 12/07/2020 0208 Gross per 24 hour  Intake 458 ml  Output --  Net 458 ml    Filed Weights   12/05/20 2008 12/06/20 0618 12/07/20 0500  Weight: 49.4 kg 49.3 kg 50.8 kg    Exam: General exam: In no acute distress. Respiratory system: Good air movement and clear to auscultation. Cardiovascular system: S1 & S2 heard, RRR. No JVD. Gastrointestinal system: Abdomen is nondistended, soft and nontender.  Extremities: No pedal edema. Skin: No rashes, lesions or ulcers Psychiatry: Judgement and insight appear normal. Mood & affect appropriate.   Data Reviewed:    Labs: Basic Metabolic Panel: Recent Labs  Lab 12/04/20 2108 12/07/20 0126  NA 127* 136  K 4.0 4.2  CL 91* 107  CO2 25 23  GLUCOSE 115* 90  BUN 14 7  CREATININE 0.72 0.59  CALCIUM 8.3* 7.7*    GFR Estimated Creatinine Clearance: 71.8 mL/min (by C-G formula based on SCr of 0.59 mg/dL). Liver Function Tests: Recent Labs  Lab 12/04/20 2108  AST 20  ALT 17  ALKPHOS 44  BILITOT 0.7  PROT 6.7  ALBUMIN 2.6*    No results for input(s): LIPASE, AMYLASE in the last 168 hours. No results for input(s): AMMONIA in the last 168 hours. Coagulation profile Recent Labs  Lab 12/04/20 2108  INR 1.2    COVID-19 Labs  No results for input(s): DDIMER, FERRITIN, LDH, CRP in the last 72 hours.  Lab Results  Component Value Date   SARSCOV2NAA NEGATIVE 12/04/2020   SARSCOV2NAA NEGATIVE 09/15/2019   SARSCOV2NAA  NEGATIVE 08/10/2019   SARSCOV2NAA NEGATIVE 04/03/2019    CBC: Recent Labs  Lab 12/04/20 2108 12/07/20 0126  WBC 15.9* 7.8  NEUTROABS 12.5*  --   HGB 11.0* 10.5*  HCT 32.8* 32.9*  MCV 83.7 85.0  PLT 181 188    Cardiac Enzymes: No results for input(s): CKTOTAL, CKMB, CKMBINDEX, TROPONINI in the last 168 hours. BNP (last 3 results) No results for input(s): PROBNP in the last 8760 hours. CBG: No results for input(s): GLUCAP in the last 168 hours. D-Dimer: No results for input(s): DDIMER in the last 72 hours. Hgb A1c: No results for input(s): HGBA1C in the last 72 hours. Lipid Profile: No results for input(s): CHOL, HDL, LDLCALC, TRIG, CHOLHDL, LDLDIRECT in the last 72 hours. Thyroid function studies: No results for input(s): TSH, T4TOTAL, T3FREE, THYROIDAB in the last 72  hours.  Invalid input(s): FREET3 Anemia work up: No results for input(s): VITAMINB12, FOLATE, FERRITIN, TIBC, IRON, RETICCTPCT in the last 72 hours. Sepsis Labs: Recent Labs  Lab 12/04/20 2108 12/05/20 0927 12/07/20 0126  PROCALCITON  --  0.52  --   WBC 15.9*  --  7.8  LATICACIDVEN 1.1  --   --     Microbiology Recent Results (from the past 240 hour(s))  Resp Panel by RT-PCR (Flu A&B, Covid) Peripheral     Status: None   Collection Time: 12/04/20  8:52 PM   Specimen: Peripheral; Nasopharyngeal(NP) swabs in vial transport medium  Result Value Ref Range Status   SARS Coronavirus 2 by RT PCR NEGATIVE NEGATIVE Final    Comment: (NOTE) SARS-CoV-2 target nucleic acids are NOT DETECTED.  The SARS-CoV-2 RNA is generally detectable in upper respiratory specimens during the acute phase of infection. The lowest concentration of SARS-CoV-2 viral copies this assay can detect is 138 copies/mL. A negative result does not preclude SARS-Cov-2 infection and should not be used as the sole basis for treatment or other patient management decisions. A negative result may occur with  improper specimen  collection/handling, submission of specimen other than nasopharyngeal swab, presence of viral mutation(s) within the areas targeted by this assay, and inadequate number of viral copies(<138 copies/mL). A negative result must be combined with clinical observations, patient history, and epidemiological information. The expected result is Negative.  Fact Sheet for Patients:  EntrepreneurPulse.com.au  Fact Sheet for Healthcare Providers:  IncredibleEmployment.be  This test is no t yet approved or cleared by the [place] FDA and  has been authorized for detection and/or diagnosis of SARS-CoV-2 by FDA under an Emergency Use Authorization (EUA). This EUA will remain  in effect (meaning this test can be used) for the duration of the COVID-19 declaration under Section 564(b)(1) of the Act, 21 U.S.C.section 360bbb-3(b)(1), unless the authorization is terminated  or revoked sooner.       Influenza A by PCR NEGATIVE NEGATIVE Final   Influenza B by PCR NEGATIVE NEGATIVE Final    Comment: (NOTE) The Xpert Xpress SARS-CoV-2/FLU/RSV plus assay is intended as an aid in the diagnosis of influenza from Nasopharyngeal swab specimens and should not be used as a sole basis for treatment. Nasal washings and aspirates are unacceptable for Xpert Xpress SARS-CoV-2/FLU/RSV testing.  Fact Sheet for Patients: EntrepreneurPulse.com.au  Fact Sheet for Healthcare Providers: IncredibleEmployment.be  This test is not yet approved or cleared by the [place] FDA and has been authorized for detection and/or diagnosis of SARS-CoV-2 by FDA under an Emergency Use Authorization (EUA). This EUA will remain in effect (meaning this test can be used) for the duration of the COVID-19 declaration under Section 564(b)(1) of the Act, 21 U.S.C. section 360bbb-3(b)(1), unless the authorization is terminated or revoked.  Performed at [facility], [place] [address], [place], [place] [address]   Blood Culture (routine x 2)     Status: Abnormal (Preliminary result)   Collection Time: 12/04/20  9:08 PM   Specimen: BLOOD RIGHT HAND  Result Value Ref Range Status   Specimen Description BLOOD RIGHT HAND  Final   Special Requests   Final    BOTTLES DRAWN AEROBIC AND ANAEROBIC Blood Culture adequate volume   Culture  Setup Time   Final    GRAM POSITIVE COCCI IN CHAINS IN PAIRS AEROBIC BOTTLE ONLY Organism ID to follow CRITICAL RESULT CALLED TO, READ BACK BY AND VERIFIED WITHJiles Garter [facility] PHARMD [id number] 12/05/20  A BROWNING    Culture (A)  Final    GROUP A STREP (S.PYOGENES) ISOLATED SUSCEPTIBILITIES TO FOLLOW Performed at [facility], [place] [address], [place], [place] [address]    Report Status PENDING  Incomplete  Blood Culture (routine x 2)     Status: Abnormal (Preliminary result)   Collection Time: 12/04/20  9:08 PM   Specimen: BLOOD RIGHT ARM  Result Value Ref Range Status   Specimen Description BLOOD RIGHT ARM  Final   Special Requests   Final    BOTTLES DRAWN AEROBIC ONLY Blood Culture adequate volume   Culture  Setup Time   Final    GRAM POSITIVE COCCI IN CHAINS AEROBIC BOTTLE ONLY CRITICAL VALUE NOTED.  VALUE IS CONSISTENT WITH PREVIOUSLY REPORTED AND CALLED VALUE. Performed at [facility], [place] [address], [place], [place] [address]    Culture GROUP A STREP (S.PYOGENES) ISOLATED (A)  Final   Report Status PENDING  Incomplete  Blood Culture ID Panel (Reflexed)     Status: Abnormal   Collection Time: 12/04/20  9:08 PM  Result Value Ref Range Status   Enterococcus faecalis NOT DETECTED NOT DETECTED Final   Enterococcus Faecium NOT DETECTED NOT DETECTED Final   Listeria monocytogenes NOT DETECTED NOT DETECTED Final   Staphylococcus species NOT DETECTED NOT DETECTED Final   Staphylococcus aureus (BCID) NOT DETECTED NOT DETECTED Final   Staphylococcus epidermidis NOT DETECTED NOT DETECTED Final    Staphylococcus lugdunensis NOT DETECTED NOT DETECTED Final   Streptococcus species DETECTED (A) NOT DETECTED Final    Comment: CRITICAL RESULT CALLED TO, READ BACK BY AND VERIFIED WITHJiles Garter [facility] PHARMD [id number] 12/05/20 A BROWNING    Streptococcus agalactiae NOT DETECTED NOT DETECTED Final   Streptococcus pneumoniae NOT DETECTED NOT DETECTED Final   Streptococcus pyogenes DETECTED (A) NOT DETECTED Final    Comment: CRITICAL RESULT CALLED TO, READ BACK BY AND VERIFIED WITHJiles Garter [facility] PHARMD [id number] 12/05/20 A BROWNING    A.calcoaceticus-baumannii NOT DETECTED NOT DETECTED Final   Bacteroides fragilis NOT DETECTED NOT DETECTED Final   Enterobacterales NOT DETECTED NOT DETECTED Final   Enterobacter cloacae complex NOT DETECTED NOT DETECTED Final   Escherichia coli NOT DETECTED NOT DETECTED Final   Klebsiella aerogenes NOT DETECTED NOT DETECTED Final   Klebsiella oxytoca NOT DETECTED NOT DETECTED Final   Klebsiella pneumoniae NOT DETECTED NOT DETECTED Final   Proteus species NOT DETECTED NOT DETECTED Final   Salmonella species NOT DETECTED NOT DETECTED Final   Serratia marcescens NOT DETECTED NOT DETECTED Final   Haemophilus influenzae NOT DETECTED NOT DETECTED Final   Neisseria meningitidis NOT DETECTED NOT DETECTED Final   Pseudomonas aeruginosa NOT DETECTED NOT DETECTED Final   Stenotrophomonas maltophilia NOT DETECTED NOT DETECTED Final   Candida albicans NOT DETECTED NOT DETECTED Final   Candida auris NOT DETECTED NOT DETECTED Final   Candida glabrata NOT DETECTED NOT DETECTED Final   Candida krusei NOT DETECTED NOT DETECTED Final   Candida parapsilosis NOT DETECTED NOT DETECTED Final   Candida tropicalis NOT DETECTED NOT DETECTED Final   Cryptococcus neoformans/gattii NOT DETECTED NOT DETECTED Final    Comment: Performed at [facility], [place]. [address], [place], [place] [address]  Culture, blood (single)     Status: None (Preliminary result)   Collection Time: 12/05/20   1:30 AM   Specimen: BLOOD  Result Value Ref Range Status   Specimen Description BLOOD SITE NOT SPECIFIED  Final   Special Requests  Final    BOTTLES DRAWN AEROBIC AND ANAEROBIC Blood Culture adequate volume   Culture   Final    NO GROWTH 1 DAY Performed at [facility], [place] [address] [place], [place] [address]    Report Status PENDING  Incomplete     Medications:    enoxaparin (LOVENOX) injection  50 mg Subcutaneous Q12H   ketorolac  15 mg Intravenous Q6H   methadone  20 mg Oral Daily   pantoprazole  40 mg Oral Daily   Continuous Infusions:  clindamycin (CLEOCIN) IV 600 mg (12/07/20 0301)   penicillin g continuous IV infusion 12 Million Units (12/06/20 2127)      LOS: 2 days   Charlynne Cousins  [facility]  12/07/2020, 9:23 AM

## 2020-12-07 NOTE — Plan of Care (Signed)

## 2020-12-07 NOTE — H&P (View-Only) (Signed)
[facility] PROGRESS NOTE    Progress Note  Summer Cain  [id number] DOB: 09-06-1985 DOA: 12/04/2020 PCP: Patient, No Pcp Per (Inactive)     Brief Narrative:   Summer Cain is an 35 y.o. female past medical history of IV drug abuse, opiate dependence hepatitis C with mitral valve vegetation on TTE July 2021, her cultures at that time were positive for Pseudomonas, MRSA and Serratia but unfortunately she left AMA comes into the ED for 1 week of left arm pain associated with swelling and redness she also has a lump on her chest wall and swollen lymph nodes.  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  12/08/2020 TEE   Assessment/Plan:   Acute deep vein thrombosis (DVT) of left upper extremity ([place]): Provoked likely due to IV drug abuse was started on heparin. Upper extremity Doppler to rule out DVT has been sent, he was started on IV heparin and antibiotics see below for the details. Currently on methadone and Dilaudid.  Sepsis likely due to left upper extremity cellulitis: blood cultures 11/18/2022strep pyogenes. Surveillance blood cultures have been sent. Continue IV vancomycin and clindamycin, has remained afebrile with no leukocytosis. TEE scheduled for 12/08/2020. CT maxillofacial no acute findings. Surveillance blood cultures on 12/07/2018 are pending. Currently on Suboxone and Oxy. CT surgery has been notified they recommended a TEE cannot appreciate aortic root abscess. Infectious disease was consulted recommended an MRI of the cervical and lumbar spine.  He also recommended to continue penicillins and 3 days of IV  clindamycin.  Cavitary lung lesion/pulmonary embolism: Blood cultures were obtained, started empirically on Vanco and clindamycin.  Hyponatremia probably hypovolemic: Resolved with IV fluid hydration.  Hepatitis C: Will need follow-up as an outpatient.  IV drug abuse: Continue methadone and hydromorphone, I have explained to her that we will wean her down over IV Dilaudid which she did not take well.  Not in a good mood this morning.  DVT prophylaxis: IV heparin Family Communication:none Status is: Inpatient  Remains inpatient appropriate because: Acute DVT with upper extremity cellulitis concern for endocarditis    Code Status:     Code Status Orders  (From admission, onward)           Start     Ordered   12/05/20 0620  Full code  Continuous        12/05/20 0621           Code Status History     Date Active Date Inactive Code Status Order ID Comments User Context   09/15/2019 1509 09/20/2019 2224 Full Code [id number]  Delice Bison, DO ED   08/11/2019 0147 08/13/2019 1529 Full Code [id number]  Mansy, Arvella Merles, MD ED   04/01/2019 1108 04/04/2019 0303 Full Code [id number]  Jean Rosenthal, MD ED   05/22/2018 0203 05/24/2018 1759 Full Code [id number]  Donnamae Jude, MD Inpatient   04/08/2017 0942 04/10/2017 2153 Full Code [id number]  Christin Fudge, [place] Inpatient   04/08/2017 0729 04/08/2017 [facility] Full Code [id number]  Christin Fudge, [place] Inpatient   11/07/2015 0120 11/08/2015 1723 Full Code [id number]  Darlina Rumpf Inpatient   11/06/2015 1033 11/07/2015 0120 Full Code [id number]  Myrtis Ser, CNM Inpatient         IV Access:   Peripheral IV   Procedures and diagnostic studies:   CT MAXILLOFACIAL W CONTRAST  Result Date: 12/06/2020 CLINICAL DATA:  Facial swelling EXAM: CT MAXILLOFACIAL WITH CONTRAST TECHNIQUE: Multidetector CT imaging of the maxillofacial structures was performed with intravenous contrast. Multiplanar CT image  reconstructions were also generated. CONTRAST:  34mL OMNIPAQUE IOHEXOL 300 MG/ML  SOLN COMPARISON:  None. FINDINGS: Osseous: Is Orbits: The globes are intact. Normal appearance of the intra- and extraconal fat. Symmetric extraocular muscles. Sinuses: No fluid levels or advanced mucosal thickening. Soft tissues: Normal visualized extracranial soft tissues. Limited intracranial: Normal. Other: None. IMPRESSION: Normal maxillofacial CT. Electronically Signed   By: Ulyses Jarred M.D.   On: 12/06/2020 21:31   ECHOCARDIOGRAM COMPLETE  Result Date: 12/05/2020    ECHOCARDIOGRAM REPORT   Patient Name:   Summer Cain Date of Exam: 12/05/2020 Medical Rec #:  [id number]       Height:       61.0 in Accession #:    [id number]      Weight:       99.2 lb Date of Birth:  Dec 04, 1985       BSA:          1.402 m Patient Age:    35 years        BP:           84/52 mmHg Patient Gender: F               HR:           69 bpm. Exam Location:  Inpatient Procedure: 2D Echo, Cardiac Doppler and Color Doppler Indications:    Endocarditis  History:        Patient has prior history of Echocardiogram examinations, most                 recent 09/17/2020. Signs/Symptoms:Chest Pain.  Sonographer:    Glo Herring Referring Phys: [id number] TIMOTHY S OPYD  Sonographer Comments: Technically difficult due to patient being supine IMPRESSIONS  1. 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 Recommend TEE to better assess.  2. Left ventricular ejection fraction, by estimation, is 55 to 60%. The left ventricle has normal function. The left ventricle has no regional wall motion abnormalities. Left ventricular diastolic parameters were normal.  3. Right ventricular systolic function is normal. The right ventricular size is normal. There is normal pulmonary artery systolic pressure.  4. The anterior mitral valve leaflet is thickened and there is a mobile density (1.37 x 0.82 cm) on the  ventricular side of the anterior leaflet.. The mitral valve is normal in structure. Mild mitral valve regurgitation. No evidence of mitral stenosis.  5. There is thickening on the atrial side of the tricuspid valve septal leaflet. Measures 1.3 x 1.5 cm. Findings concerning for endocarditis.  6. The aortic valve is tricuspid. Aortic valve regurgitation is not visualized. No aortic stenosis is present.  7. The aortic root appears thickened. 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  8. The inferior vena cava is dilated in size with >50% respiratory variability, suggesting right atrial pressure of 8 mmHg. FINDINGS  Left Ventricle: Left ventricular ejection fraction, by estimation, is 55 to 60%. The left ventricle has normal function. The left ventricle has no regional wall motion abnormalities. The left ventricular internal cavity size was normal in size. There is  no left ventricular  hypertrophy. Left ventricular diastolic parameters were normal. Normal left ventricular filling pressure. Right Ventricle: The right ventricular size is normal. No increase in right ventricular wall thickness. Right ventricular systolic function is normal. There is normal pulmonary artery systolic pressure. The tricuspid regurgitant velocity is 1.90 m/s, and  with an assumed right atrial pressure of 8 mmHg, the estimated right ventricular systolic pressure is [id number] mmHg. Left Atrium: Left atrial size was normal in size. Right Atrium: Right atrial size was normal in size. Pericardium: There is no evidence of pericardial effusion. Mitral Valve: The anterior mitral valve leaflet is thickened and there is a mobile density (1.37 x 0.82 cm) on the ventricular side of the anterior leaflet. The mitral valve is normal in structure. Mild mitral valve regurgitation. No evidence of mitral valve stenosis. Tricuspid Valve: There is thickening on the atrial side of the tricuspid valve septal leaflet. Measures 1.3 x 1.5 cm. Findings concerning for endocarditis.  The tricuspid valve is normal in structure. Tricuspid valve regurgitation is trivial. No evidence of tricuspid stenosis. Aortic Valve: The aortic valve is tricuspid. Aortic valve regurgitation is not visualized. No aortic stenosis is present. Pulmonic Valve: The pulmonic valve was normal in structure. Pulmonic valve regurgitation is not visualized. No evidence of pulmonic stenosis. Aorta: The aortic root appears thickened. 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 The aortic root is normal in size and structure. Venous: The inferior vena cava is dilated in size with greater than 50% respiratory variability, suggesting right atrial pressure of 8 mmHg. IAS/Shunts: No atrial level shunt detected by color flow Doppler.  LEFT VENTRICLE PLAX 2D LVIDd:         4.50 cm     Diastology LVIDs:         2.70 cm     LV e' medial:    10.90 cm/s LV PW:         1.10 cm     LV E/e' medial:  9.6 LV IVS:        0.60 cm     LV e' lateral:   9.90 cm/s LVOT diam:     1.90 cm     LV E/e' lateral: 10.6 LVOT Area:     2.84 cm  LV Volumes (MOD) LV vol d, MOD A2C: 58.1 ml LV vol d, MOD A4C: 64.3 ml LV vol s, MOD A2C: 28.4 ml LV vol s, MOD A4C: 28.8 ml LV SV MOD A2C:     29.8 ml LV SV MOD A4C:     64.3 ml LV SV MOD BP:      34.5 ml IVC IVC diam: 2.50 cm LEFT ATRIUM             Index LA diam:        3.20 cm 2.28 cm/m LA Vol (A2C):   36.0 ml 25.68 ml/m LA Vol (A4C):   35.0 ml 24.96 ml/m LA Biplane Vol: 38.6 ml 27.53 ml/m                        PULMONIC VALVE AORTA                 PV Vmax:       0.99 m/s Ao Root diam: 2.60 cm PV Peak grad:  3.9 mmHg  MITRAL VALVE                TRICUSPID VALVE MV Area (PHT): 4.80 cm     TR Peak grad:   14.4 mmHg  MV Decel Time: 158 msec     TR Vmax:        190.00 cm/s MV E velocity: 105.00 cm/s MV A velocity: 47.10 cm/s   SHUNTS MV E/A ratio:  2.23         Systemic Diam: 1.90 cm Chilton Si MD Electronically signed by Chilton Si MD Signature Date/Time: 12/05/2020/3:05:49 PM    Final      Medical  Consultants:   None.   Subjective:    Summer Cain not in a good mood this morning complaining of hurting all over.  Objective:    Vitals:   12/06/20 2015 12/06/20 2317 12/07/20 0500 12/07/20 0540  BP: 102/90 105/70  90/65  Pulse: 67 80    Resp: 19 19  18   Temp: 98.4 F (36.9 C) 98.6 F (37 C)  98.5 F (36.9 C)  TempSrc: Oral Oral  Oral  SpO2: 96% 99%  97%  Weight:   50.8 kg   Height:       SpO2: 97 %   Intake/Output Summary (Last 24 hours) at 12/07/2020 0923 Last data filed at 12/07/2020 0208 Gross per 24 hour  Intake 458 ml  Output --  Net 458 ml    Filed Weights   12/05/20 2008 12/06/20 0618 12/07/20 0500  Weight: 49.4 kg 49.3 kg 50.8 kg    Exam: General exam: In no acute distress. Respiratory system: Good air movement and clear to auscultation. Cardiovascular system: S1 & S2 heard, RRR. No JVD. Gastrointestinal system: Abdomen is nondistended, soft and nontender.  Extremities: No pedal edema. Skin: No rashes, lesions or ulcers Psychiatry: Judgement and insight appear normal. Mood & affect appropriate.   Data Reviewed:    Labs: Basic Metabolic Panel: Recent Labs  Lab 12/04/20 2108 12/07/20 0126  NA 127* 136  K 4.0 4.2  CL 91* 107  CO2 25 23  GLUCOSE 115* 90  BUN 14 7  CREATININE 0.72 0.59  CALCIUM 8.3* 7.7*    GFR Estimated Creatinine Clearance: 71.8 mL/min (by C-G formula based on SCr of 0.59 mg/dL). Liver Function Tests: Recent Labs  Lab 12/04/20 2108  AST 20  ALT 17  ALKPHOS 44  BILITOT 0.7  PROT 6.7  ALBUMIN 2.6*    No results for input(s): LIPASE, AMYLASE in the last 168 hours. No results for input(s): AMMONIA in the last 168 hours. Coagulation profile Recent Labs  Lab 12/04/20 2108  INR 1.2    COVID-19 Labs  No results for input(s): DDIMER, FERRITIN, LDH, CRP in the last 72 hours.  Lab Results  Component Value Date   SARSCOV2NAA NEGATIVE 12/04/2020   SARSCOV2NAA NEGATIVE 09/15/2019   SARSCOV2NAA  NEGATIVE 08/10/2019   SARSCOV2NAA NEGATIVE 04/03/2019    CBC: Recent Labs  Lab 12/04/20 2108 12/07/20 0126  WBC 15.9* 7.8  NEUTROABS 12.5*  --   HGB 11.0* 10.5*  HCT 32.8* 32.9*  MCV 83.7 85.0  PLT 181 188    Cardiac Enzymes: No results for input(s): CKTOTAL, CKMB, CKMBINDEX, TROPONINI in the last 168 hours. BNP (last 3 results) No results for input(s): PROBNP in the last 8760 hours. CBG: No results for input(s): GLUCAP in the last 168 hours. D-Dimer: No results for input(s): DDIMER in the last 72 hours. Hgb A1c: No results for input(s): HGBA1C in the last 72 hours. Lipid Profile: No results for input(s): CHOL, HDL, LDLCALC, TRIG, CHOLHDL, LDLDIRECT in the last 72 hours. Thyroid function studies: No results for input(s): TSH, T4TOTAL, T3FREE, THYROIDAB in the last 72  hours.  Invalid input(s): FREET3 Anemia work up: No results for input(s): VITAMINB12, FOLATE, FERRITIN, TIBC, IRON, RETICCTPCT in the last 72 hours. Sepsis Labs: Recent Labs  Lab 12/04/20 2108 12/05/20 0927 12/07/20 0126  PROCALCITON  --  0.52  --   WBC 15.9*  --  7.8  LATICACIDVEN 1.1  --   --     Microbiology Recent Results (from the past 240 hour(s))  Resp Panel by RT-PCR (Flu A&B, Covid) Peripheral     Status: None   Collection Time: 12/04/20  8:52 PM   Specimen: Peripheral; Nasopharyngeal(NP) swabs in vial transport medium  Result Value Ref Range Status   SARS Coronavirus 2 by RT PCR NEGATIVE NEGATIVE Final    Comment: (NOTE) SARS-CoV-2 target nucleic acids are NOT DETECTED.  The SARS-CoV-2 RNA is generally detectable in upper respiratory specimens during the acute phase of infection. The lowest concentration of SARS-CoV-2 viral copies this assay can detect is 138 copies/mL. A negative result does not preclude SARS-Cov-2 infection and should not be used as the sole basis for treatment or other patient management decisions. A negative result may occur with  improper specimen  collection/handling, submission of specimen other than nasopharyngeal swab, presence of viral mutation(s) within the areas targeted by this assay, and inadequate number of viral copies(<138 copies/mL). A negative result must be combined with clinical observations, patient history, and epidemiological information. The expected result is Negative.  Fact Sheet for Patients:  EntrepreneurPulse.com.au  Fact Sheet for Healthcare Providers:  IncredibleEmployment.be  This test is no t yet approved or cleared by the [place] FDA and  has been authorized for detection and/or diagnosis of SARS-CoV-2 by FDA under an Emergency Use Authorization (EUA). This EUA will remain  in effect (meaning this test can be used) for the duration of the COVID-19 declaration under Section 564(b)(1) of the Act, 21 U.S.C.section 360bbb-3(b)(1), unless the authorization is terminated  or revoked sooner.       Influenza A by PCR NEGATIVE NEGATIVE Final   Influenza B by PCR NEGATIVE NEGATIVE Final    Comment: (NOTE) The Xpert Xpress SARS-CoV-2/FLU/RSV plus assay is intended as an aid in the diagnosis of influenza from Nasopharyngeal swab specimens and should not be used as a sole basis for treatment. Nasal washings and aspirates are unacceptable for Xpert Xpress SARS-CoV-2/FLU/RSV testing.  Fact Sheet for Patients: EntrepreneurPulse.com.au  Fact Sheet for Healthcare Providers: IncredibleEmployment.be  This test is not yet approved or cleared by the [place] FDA and has been authorized for detection and/or diagnosis of SARS-CoV-2 by FDA under an Emergency Use Authorization (EUA). This EUA will remain in effect (meaning this test can be used) for the duration of the COVID-19 declaration under Section 564(b)(1) of the Act, 21 U.S.C. section 360bbb-3(b)(1), unless the authorization is terminated or revoked.  Performed at [facility], [place] [address], [place], [place] [address]   Blood Culture (routine x 2)     Status: Abnormal (Preliminary result)   Collection Time: 12/04/20  9:08 PM   Specimen: BLOOD RIGHT HAND  Result Value Ref Range Status   Specimen Description BLOOD RIGHT HAND  Final   Special Requests   Final    BOTTLES DRAWN AEROBIC AND ANAEROBIC Blood Culture adequate volume   Culture  Setup Time   Final    GRAM POSITIVE COCCI IN CHAINS IN PAIRS AEROBIC BOTTLE ONLY Organism ID to follow CRITICAL RESULT CALLED TO, READ BACK BY AND VERIFIED WITHJiles Garter [facility] PHARMD [id number] 12/05/20  A BROWNING    Culture (A)  Final    GROUP A STREP (S.PYOGENES) ISOLATED SUSCEPTIBILITIES TO FOLLOW Performed at [facility], [place] [address] [place], [place] [address]    Report Status PENDING  Incomplete  Blood Culture (routine x 2)     Status: Abnormal (Preliminary result)   Collection Time: 12/04/20  9:08 PM   Specimen: BLOOD RIGHT ARM  Result Value Ref Range Status   Specimen Description BLOOD RIGHT ARM  Final   Special Requests   Final    BOTTLES DRAWN AEROBIC ONLY Blood Culture adequate volume   Culture  Setup Time   Final    GRAM POSITIVE COCCI IN CHAINS AEROBIC BOTTLE ONLY CRITICAL VALUE NOTED.  VALUE IS CONSISTENT WITH PREVIOUSLY REPORTED AND CALLED VALUE. Performed at [facility], [place] [address], [place], [place] [address]    Culture GROUP A STREP (S.PYOGENES) ISOLATED (A)  Final   Report Status PENDING  Incomplete  Blood Culture ID Panel (Reflexed)     Status: Abnormal   Collection Time: 12/04/20  9:08 PM  Result Value Ref Range Status   Enterococcus faecalis NOT DETECTED NOT DETECTED Final   Enterococcus Faecium NOT DETECTED NOT DETECTED Final   Listeria monocytogenes NOT DETECTED NOT DETECTED Final   Staphylococcus species NOT DETECTED NOT DETECTED Final   Staphylococcus aureus (BCID) NOT DETECTED NOT DETECTED Final   Staphylococcus epidermidis NOT DETECTED NOT DETECTED Final    Staphylococcus lugdunensis NOT DETECTED NOT DETECTED Final   Streptococcus species DETECTED (A) NOT DETECTED Final    Comment: CRITICAL RESULT CALLED TO, READ BACK BY AND VERIFIED WITHJiles Garter [facility] PHARMD 1656 12/05/20 A BROWNING    Streptococcus agalactiae NOT DETECTED NOT DETECTED Final   Streptococcus pneumoniae NOT DETECTED NOT DETECTED Final   Streptococcus pyogenes DETECTED (A) NOT DETECTED Final    Comment: CRITICAL RESULT CALLED TO, READ BACK BY AND VERIFIED WITHJiles Garter [facility] PHARMD 1656 12/05/20 A BROWNING    A.calcoaceticus-baumannii NOT DETECTED NOT DETECTED Final   Bacteroides fragilis NOT DETECTED NOT DETECTED Final   Enterobacterales NOT DETECTED NOT DETECTED Final   Enterobacter cloacae complex NOT DETECTED NOT DETECTED Final   Escherichia coli NOT DETECTED NOT DETECTED Final   Klebsiella aerogenes NOT DETECTED NOT DETECTED Final   Klebsiella oxytoca NOT DETECTED NOT DETECTED Final   Klebsiella pneumoniae NOT DETECTED NOT DETECTED Final   Proteus species NOT DETECTED NOT DETECTED Final   Salmonella species NOT DETECTED NOT DETECTED Final   Serratia marcescens NOT DETECTED NOT DETECTED Final   Haemophilus influenzae NOT DETECTED NOT DETECTED Final   Neisseria meningitidis NOT DETECTED NOT DETECTED Final   Pseudomonas aeruginosa NOT DETECTED NOT DETECTED Final   Stenotrophomonas maltophilia NOT DETECTED NOT DETECTED Final   Candida albicans NOT DETECTED NOT DETECTED Final   Candida auris NOT DETECTED NOT DETECTED Final   Candida glabrata NOT DETECTED NOT DETECTED Final   Candida krusei NOT DETECTED NOT DETECTED Final   Candida parapsilosis NOT DETECTED NOT DETECTED Final   Candida tropicalis NOT DETECTED NOT DETECTED Final   Cryptococcus neoformans/gattii NOT DETECTED NOT DETECTED Final    Comment: Performed at [facility], [place]. [address] [place], [place] [address]  Culture, blood (single)     Status: None (Preliminary result)   Collection Time: 12/05/20   1:30 AM   Specimen: BLOOD  Result Value Ref Range Status   Specimen Description BLOOD SITE NOT SPECIFIED  Final   Special Requests  Final    BOTTLES DRAWN AEROBIC AND ANAEROBIC Blood Culture adequate volume   Culture   Final    NO GROWTH 1 DAY Performed at [facility], [place] [address] [place], [place] [address]    Report Status PENDING  Incomplete     Medications:    enoxaparin (LOVENOX) injection  50 mg Subcutaneous Q12H   ketorolac  15 mg Intravenous Q6H   methadone  20 mg Oral Daily   pantoprazole  40 mg Oral Daily   Continuous Infusions:  clindamycin (CLEOCIN) IV 600 mg (12/07/20 0301)   penicillin g continuous IV infusion 12 Million Units (12/06/20 2127)      LOS: 2 days   Charlynne Cousins  [facility]  12/07/2020, 9:23 AM

## 2020-12-08 ENCOUNTER — Inpatient Hospital Stay (HOSPITAL_COMMUNITY): Payer: Medicaid Other

## 2020-12-08 ENCOUNTER — Encounter (HOSPITAL_COMMUNITY)
Admission: EM | Discharge status: Against Medical Advice | Payer: Self-pay | Source: Home / Self Care | Attending: Internal Medicine

## 2020-12-08 ENCOUNTER — Encounter (HOSPITAL_COMMUNITY): Payer: Self-pay | Admitting: Family Medicine

## 2020-12-08 ENCOUNTER — Inpatient Hospital Stay (HOSPITAL_COMMUNITY): Payer: Medicaid Other | Admitting: Certified Registered Nurse Anesthetist

## 2020-12-08 ENCOUNTER — Encounter (HOSPITAL_COMMUNITY): Payer: Self-pay | Admitting: Anesthesiology

## 2020-12-08 DIAGNOSIS — I82A12 Acute embolism and thrombosis of left axillary vein: Secondary | ICD-10-CM

## 2020-12-08 DIAGNOSIS — E871 Hypo-osmolality and hyponatremia: Secondary | ICD-10-CM | POA: Diagnosis not present

## 2020-12-08 DIAGNOSIS — F1990 Other psychoactive substance use, unspecified, uncomplicated: Secondary | ICD-10-CM | POA: Diagnosis not present

## 2020-12-08 DIAGNOSIS — I82622 Acute embolism and thrombosis of deep veins of left upper extremity: Secondary | ICD-10-CM | POA: Diagnosis not present

## 2020-12-08 LAB — CULTURE, BLOOD (ROUTINE X 2)
Special Requests: ADEQUATE
Special Requests: ADEQUATE

## 2020-12-08 LAB — HEPATITIS B SURFACE ANTIBODY, QUANTITATIVE: Hep B S AB Quant (Post): <3.1 m[IU]/mL — ABNORMAL LOW (ref >9.9)

## 2020-12-08 SURGERY — INVASIVE LAB ABORTED CASE
Anesthesia: Monitor Anesthesia Care

## 2020-12-08 MED ORDER — LIDOCAINE 2% (20 MG/ML) 5 ML SYRINGE
INTRAMUSCULAR | Status: DC | PRN
  Administered 2020-12-08: 40 mg via INTRAVENOUS

## 2020-12-08 MED ORDER — PROPOFOL 500 MG/50ML IV EMUL
INTRAVENOUS | Status: DC | PRN
  Administered 2020-12-08: 150 ug/kg/min via INTRAVENOUS

## 2020-12-08 MED ORDER — SODIUM CHLORIDE 0.9 % IV SOLN: INTRAVENOUS | Status: DC | PRN

## 2020-12-08 MED ORDER — DEXMEDETOMIDINE (PRECEDEX) IN NS 20 MCG/5ML (4 MCG/ML) IV SYRINGE
PREFILLED_SYRINGE | INTRAVENOUS | Status: DC | PRN
  Administered 2020-12-08: 8 ug via INTRAVENOUS

## 2020-12-08 NOTE — Progress Notes (Signed)
Patient presented for TEE today for endocarditis evaluation.  Propofol was started through PIV but IV access was lost.  Anesthesia evaluated for alternative IV access, but could not find.  Discussed with Dr David Stall: planning PICC line, likely tomorrow.  TEE rescheduled for 11/23.

## 2020-12-08 NOTE — Progress Notes (Signed)
Patient refused to allow morning labs to be drawn.

## 2020-12-08 NOTE — Anesthesia Procedure Notes (Signed)
Procedure Name: MAC Date/Time: 12/08/2020 11:25 AM Performed by: Janene Harvey, CRNA Pre-anesthesia Checklist: Patient identified, Emergency Drugs available, Suction available and Patient being monitored Patient Re-evaluated:Patient Re-evaluated prior to induction Oxygen Delivery Method: Nasal cannula Placement Confirmation: positive ETCO2 Dental Injury: Teeth and Oropharynx as per pre-operative assessment

## 2020-12-08 NOTE — Progress Notes (Addendum)
      [address]       [place] [address]             [phone]          Procedure(s) (LRB): TRANSESOPHAGEAL ECHOCARDIOGRAM (TEE) (N/A)  Subjective: Patient sleeping this am. She states she is cold when covers taken down to examine her.  Objective: Vital signs in last 24 hours: Temp:  [97.8 F (36.6 C)-98.6 F (37 C)] 98.5 F (36.9 C) (11/21 0356) Pulse Rate:  [66-88] 70 (11/21 0356) Cardiac Rhythm: Normal sinus rhythm (11/20 2019) Resp:  [17-21] 17 (11/21 0356) BP: (101-120)/(66-85) 105/74 (11/21 0356) SpO2:  [96 %-98 %] 97 % (11/21 0356) Weight:  [49.6 kg] 49.6 kg (11/21 0619)  Current Weight  12/08/20 49.6 kg      Intake/Output from previous day: No intake/output data recorded.   Physical Exam:  Cardiovascular: RRR Pulmonary: Clear to auscultation bilaterally Abdomen: Soft, non tender, bowel sounds present. Extremities:No  lower extremity edema.   Lab Results: CBC: Recent Labs    12/07/20 0126  WBC 7.8  HGB 10.5*  HCT 32.9*  PLT 188   BMET:  Recent Labs    12/07/20 0126  NA 136  K 4.2  CL 107  CO2 23  GLUCOSE 90  BUN 7  CREATININE 0.59  CALCIUM 7.7*    PT/INR:  Lab Results  Component Value Date   INR 1.2 12/04/2020   INR 0.9 04/03/2019   INR 0.9 04/02/2019   ABG:  INR: Will add last result for INR, ABG once components are confirmed Will add last 4 CBG results once components are confirmed  Assessment/Plan:  1. CV - SR with HR in the 70's. TEE to be done to evaluate if aortic root abscess present 2.  Pulmonary - On room air. Per CT, cavitary lesion in basilar lingula (septic emoboli likely). 3. History of IVDU-on Methadone and Hydromorphone. Dilaudid being weaned 4.  Anemia - H and H yesterday stable at 10.5 and 32.9 5. ID-on Clindamycin and PCN G for Strep Pyogenes bacteremia, MV endocarditis. Awaiting repeat blood culture result 6. LUE DVT-on Lovenox [place] bid 7. Poor dentition-awaiting dental  evaluation 8. Hepatitis C-may be chronic. Per ID  Gryphon Vanderveen M ZimmermanPA-C 12/08/2020,7:13 AM

## 2020-12-08 NOTE — Progress Notes (Signed)
[facility] PROGRESS NOTE    Progress Note  Summer Cain  [id number] DOB: 02-08-1985 DOA: 12/04/2020 PCP: Patient, No Pcp Per (Inactive)     Brief Narrative:   Summer Cain is an 35 y.o. female past medical history of IV drug abuse, opiate dependence hepatitis C with mitral valve vegetation on TTE July 2021, her cultures at that time were positive for Pseudomonas, MRSA and Serratia but unfortunately she left AMA comes into the ED for 1 week of left arm pain associated with swelling and redness she also has a lump on her chest wall and swollen lymph nodes.  She also relates scant hemoptysis and shortness of breath left upper extremity CT showed a DVT no abscess CT of the chest showed no PE or pneumonia started empirically on Vanco and Rocephin in the ED blood cultures were obtained showed strep pyogenes surveillance blood cultures negative.  2D echo showed multiple valve endocarditis, TEE was ordered, he is currently on IV penicillin and clindamycin CT of the maxillofacial showed no acute findings.  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  12/08/2020 TEE pending   Assessment/Plan:   Acute deep vein thrombosis (DVT) of left upper extremity ([place]): Provoked likely due to IV drug abuse was started on heparin. Upper extremity Doppler to rule out DVT has been sent, he was started on IV heparin and antibiotics see below for the details. Currently on methadone and Dilaudid.  Sepsis likely due to left upper extremity cellulitis: Blood cultures 11/18/2022strep pyogenes. TEE scheduled for 12/08/2020, cannot be done as her IV blew off and she did not alone to get another IV. Surveillance blood cultures on 12/07/2018 are negative till date, need to be wait at least 72 hours before placing midline or PICC line. Currently on methadone and oxy CT surgery has been notified they  recommended a TEE, cannot appreciate aortic root abscess TTE. Infectious disease was consulted recommended an MRI of the cervical and lumbar spine which the patient refused to get, we have her scheduled for 12/09/2020 under anesthesia. Continue IV penicillin and clindamycin.  Cavitary lung lesion/pulmonary embolism: Blood cultures were obtained, continue empirically on penicillin and clindamycin.  Hyponatremia probably hypovolemic: Resolved with IV fluid hydration.  Hepatitis C: Will need follow-up as an outpatient.  IV drug abuse: Continue methadone and hydromorphone, I have explained to her that we will wean her down over IV Dilaudid which she did not take well.  Not in a good mood this morning.  DVT prophylaxis: IV heparin Family Communication:none Status is: Inpatient  Remains inpatient appropriate because: Acute DVT with upper extremity cellulitis concern for endocarditis    Code Status:     Code Status Orders  (From admission, onward)           Start     Ordered   12/05/20 0620  Full code  Continuous        12/05/20 0621           Code Status History     Date Active Date Inactive Code Status Order ID Comments User Context   09/15/2019 1509 09/20/2019 2224 Full Code [id number]  Delice Bison, DO ED   08/11/2019 0147 08/13/2019 1529 Full Code [id number]  Mansy, Arvella Merles, MD ED   04/01/2019 1108 04/04/2019 0303 Full Code [id number]  Jean Rosenthal, MD ED   05/22/2018 0203 05/24/2018 1759 Full Code [id number]  Donnamae Jude, MD Inpatient  04/08/2017 0942 04/10/2017 2153 Full Code [id number]  Jacklyn Shell, CNM Inpatient   04/08/2017 0729 04/08/2017 0941 Full Code [id number]  Jacklyn Shell, CNM Inpatient   11/07/2015 0120 11/08/2015 1723 Full Code [id number]  Calvert Cantor Inpatient   11/06/2015 1033 11/07/2015 0120 Full Code [id number]  Arabella Merles, CNM Inpatient         IV Access:   Peripheral IV   Procedures and diagnostic  studies:   CT MAXILLOFACIAL W CONTRAST  Result Date: 12/06/2020 CLINICAL DATA:  Facial swelling EXAM: CT MAXILLOFACIAL WITH CONTRAST TECHNIQUE: Multidetector CT imaging of the maxillofacial structures was performed with intravenous contrast. Multiplanar CT image reconstructions were also generated. CONTRAST:  72mL OMNIPAQUE IOHEXOL 300 MG/ML  SOLN COMPARISON:  None. FINDINGS: Osseous: Is Orbits: The globes are intact. Normal appearance of the intra- and extraconal fat. Symmetric extraocular muscles. Sinuses: No fluid levels or advanced mucosal thickening. Soft tissues: Normal visualized extracranial soft tissues. Limited intracranial: Normal. Other: None. IMPRESSION: Normal maxillofacial CT. Electronically Signed   By: Deatra Robinson M.D.   On: 12/06/2020 21:31     Medical Consultants:   None.   Subjective:    Summer Cain more pleasant today relates she is hungry she wants to eat.  Objective:    Vitals:   12/08/20 0356 12/08/20 0619 12/08/20 0740 12/08/20 1013  BP: 105/74  119/82 (!) 124/97  Pulse: 70  68 69  Resp: 17  18 20   Temp: 98.5 F (36.9 C)  (!) 97 F (36.1 C)   TempSrc: Oral  Oral   SpO2: 97%  97% 98%  Weight:  49.6 kg  49.6 kg  Height:    5' (1.524 m)   SpO2: 98 %   Intake/Output Summary (Last 24 hours) at 12/08/2020 1135 Last data filed at 12/08/2020 1039 Gross per 24 hour  Intake --  Output 300 ml  Net -300 ml    Filed Weights   12/07/20 0500 12/08/20 0619 12/08/20 1013  Weight: 50.8 kg 49.6 kg 49.6 kg    Exam: General exam: In no acute distress. Respiratory system: Good air movement and clear to auscultation. Cardiovascular system: S1 & S2 heard, RRR. No JVD. Gastrointestinal system: Abdomen is nondistended, soft and nontender.  Extremities: No pedal edema. Skin: No rashes, lesions or ulcers Psychiatry: Judgement and insight appear normal. Mood & affect appropriate.   Data Reviewed:    Labs: Basic Metabolic Panel: Recent Labs  Lab  12/04/20 2108 12/07/20 0126  NA 127* 136  K 4.0 4.2  CL 91* 107  CO2 25 23  GLUCOSE 115* 90  BUN 14 7  CREATININE 0.72 0.59  CALCIUM 8.3* 7.7*    GFR Estimated Creatinine Clearance: 71.8 mL/min (by C-G formula based on SCr of 0.59 mg/dL). Liver Function Tests: Recent Labs  Lab 12/04/20 2108 12/07/20 1045  AST 20  --   ALT 17  --   ALKPHOS 44  --   BILITOT 0.7  --   PROT 6.7  --   ALBUMIN 2.6* 1.8*    No results for input(s): LIPASE, AMYLASE in the last 168 hours. No results for input(s): AMMONIA in the last 168 hours. Coagulation profile Recent Labs  Lab 12/04/20 2108  INR 1.2    COVID-19 Labs  No results for input(s): DDIMER, FERRITIN, LDH, CRP in the last 72 hours.  Lab Results  Component Value Date   SARSCOV2NAA NEGATIVE 12/04/2020   SARSCOV2NAA NEGATIVE 09/15/2019   SARSCOV2NAA NEGATIVE 08/10/2019   SARSCOV2NAA NEGATIVE 04/03/2019  CBC: Recent Labs  Lab 12/04/20 2108 12/07/20 0126  WBC 15.9* 7.8  NEUTROABS 12.5*  --   HGB 11.0* 10.5*  HCT 32.8* 32.9*  MCV 83.7 85.0  PLT 181 188    Cardiac Enzymes: No results for input(s): CKTOTAL, CKMB, CKMBINDEX, TROPONINI in the last 168 hours. BNP (last 3 results) No results for input(s): PROBNP in the last 8760 hours. CBG: No results for input(s): GLUCAP in the last 168 hours. D-Dimer: No results for input(s): DDIMER in the last 72 hours. Hgb A1c: No results for input(s): HGBA1C in the last 72 hours. Lipid Profile: No results for input(s): CHOL, HDL, LDLCALC, TRIG, CHOLHDL, LDLDIRECT in the last 72 hours. Thyroid function studies: No results for input(s): TSH, T4TOTAL, T3FREE, THYROIDAB in the last 72 hours.  Invalid input(s): FREET3 Anemia work up: No results for input(s): VITAMINB12, FOLATE, FERRITIN, TIBC, IRON, RETICCTPCT in the last 72 hours. Sepsis Labs: Recent Labs  Lab 12/04/20 2108 12/05/20 0927 12/07/20 0126  PROCALCITON  --  0.52  --   WBC 15.9*  --  7.8  LATICACIDVEN 1.1  --    --     Microbiology Recent Results (from the past 240 hour(s))  Resp Panel by RT-PCR (Flu A&B, Covid) Peripheral     Status: None   Collection Time: 12/04/20  8:52 PM   Specimen: Peripheral; Nasopharyngeal(NP) swabs in vial transport medium  Result Value Ref Range Status   SARS Coronavirus 2 by RT PCR NEGATIVE NEGATIVE Final    Comment: (NOTE) SARS-CoV-2 target nucleic acids are NOT DETECTED.  The SARS-CoV-2 RNA is generally detectable in upper respiratory specimens during the acute phase of infection. The lowest concentration of SARS-CoV-2 viral copies this assay can detect is 138 copies/mL. A negative result does not preclude SARS-Cov-2 infection and should not be used as the sole basis for treatment or other patient management decisions. A negative result may occur with  improper specimen collection/handling, submission of specimen other than nasopharyngeal swab, presence of viral mutation(s) within the areas targeted by this assay, and inadequate number of viral copies(<138 copies/mL). A negative result must be combined with clinical observations, patient history, and epidemiological information. The expected result is Negative.  Fact Sheet for Patients:  EntrepreneurPulse.com.au  Fact Sheet for Healthcare Providers:  IncredibleEmployment.be  This test is no t yet approved or cleared by the [place] FDA and  has been authorized for detection and/or diagnosis of SARS-CoV-2 by FDA under an Emergency Use Authorization (EUA). This EUA will remain  in effect (meaning this test can be used) for the duration of the COVID-19 declaration under Section 564(b)(1) of the Act, 21 U.S.C.section 360bbb-3(b)(1), unless the authorization is terminated  or revoked sooner.       Influenza A by PCR NEGATIVE NEGATIVE Final   Influenza B by PCR NEGATIVE NEGATIVE Final    Comment: (NOTE) The Xpert Xpress SARS-CoV-2/FLU/RSV plus assay is intended as  an aid in the diagnosis of influenza from Nasopharyngeal swab specimens and should not be used as a sole basis for treatment. Nasal washings and aspirates are unacceptable for Xpert Xpress SARS-CoV-2/FLU/RSV testing.  Fact Sheet for Patients: EntrepreneurPulse.com.au  Fact Sheet for Healthcare Providers: IncredibleEmployment.be  This test is not yet approved or cleared by the [place] FDA and has been authorized for detection and/or diagnosis of SARS-CoV-2 by FDA under an Emergency Use Authorization (EUA). This EUA will remain in effect (meaning this test can be used) for the duration of the COVID-19 declaration under Section 564(b)(1)  of the Act, 21 U.S.C. section 360bbb-3(b)(1), unless the authorization is terminated or revoked.  Performed at [facility], [place] [address] [place], [place] [address]   Blood Culture (routine x 2)     Status: Abnormal   Collection Time: 12/04/20  9:08 PM   Specimen: BLOOD RIGHT HAND  Result Value Ref Range Status   Specimen Description BLOOD RIGHT HAND  Final   Special Requests   Final    BOTTLES DRAWN AEROBIC AND ANAEROBIC Blood Culture adequate volume   Culture  Setup Time   Final    GRAM POSITIVE COCCI IN CHAINS IN PAIRS AEROBIC BOTTLE ONLY Organism ID to follow CRITICAL RESULT CALLED TO, READ BACK BY AND VERIFIED WITHJiles Garter [facility] PHARMD [id number] 12/05/20 A BROWNING    Culture (A)  Final    GROUP A STREP (S.PYOGENES) ISOLATED HEALTH DEPARTMENT NOTIFIED Performed at [facility], [place] [address], [place], [place] [address]    Report Status 12/08/2020 FINAL  Final   Organism ID, Bacteria GROUP A STREP (S.PYOGENES) ISOLATED  Final      Susceptibility   Group a strep (s.pyogenes) isolated - MIC*    PENICILLIN <=0.06 SENSITIVE Sensitive     CEFTRIAXONE <=0.12 SENSITIVE Sensitive     ERYTHROMYCIN >=8 RESISTANT Resistant     LEVOFLOXACIN <=0.25 SENSITIVE Sensitive     VANCOMYCIN 0.25  SENSITIVE Sensitive     * GROUP A STREP (S.PYOGENES) ISOLATED  Blood Culture (routine x 2)     Status: Abnormal   Collection Time: 12/04/20  9:08 PM   Specimen: BLOOD RIGHT ARM  Result Value Ref Range Status   Specimen Description BLOOD RIGHT ARM  Final   Special Requests   Final    BOTTLES DRAWN AEROBIC ONLY Blood Culture adequate volume   Culture  Setup Time   Final    GRAM POSITIVE COCCI IN CHAINS AEROBIC BOTTLE ONLY CRITICAL VALUE NOTED.  VALUE IS CONSISTENT WITH PREVIOUSLY REPORTED AND CALLED VALUE.    Culture (A)  Final    GROUP A STREP (S.PYOGENES) ISOLATED SUSCEPTIBILITIES PERFORMED ON PREVIOUS CULTURE WITHIN THE LAST 5 DAYS. HEALTH DEPARTMENT NOTIFIED Performed at [facility], [place] [address] [place],  [address]    Report Status 12/08/2020 FINAL  Final  Blood Culture ID Panel (Reflexed)     Status: Abnormal   Collection Time: 12/04/20  9:08 PM  Result Value Ref Range Status   Enterococcus faecalis NOT DETECTED NOT DETECTED Final   Enterococcus Faecium NOT DETECTED NOT DETECTED Final   Listeria monocytogenes NOT DETECTED NOT DETECTED Final   Staphylococcus species NOT DETECTED NOT DETECTED Final   Staphylococcus aureus (BCID) NOT DETECTED NOT DETECTED Final   Staphylococcus epidermidis NOT DETECTED NOT DETECTED Final   Staphylococcus lugdunensis NOT DETECTED NOT DETECTED Final   Streptococcus species DETECTED (A) NOT DETECTED Final    Comment: CRITICAL RESULT CALLED TO, READ BACK BY AND VERIFIED WITHJiles Garter [facility] PHARMD [id number] 12/05/20 A BROWNING    Streptococcus agalactiae NOT DETECTED NOT DETECTED Final   Streptococcus pneumoniae NOT DETECTED NOT DETECTED Final   Streptococcus pyogenes DETECTED (A) NOT DETECTED Final    Comment: CRITICAL RESULT CALLED TO, READ BACK BY AND VERIFIED WITHJiles Garter [facility] PHARMD 1656 12/05/20 A BROWNING    A.calcoaceticus-baumannii NOT DETECTED NOT DETECTED Final   Bacteroides fragilis NOT DETECTED NOT DETECTED Final    Enterobacterales NOT DETECTED NOT DETECTED Final   Enterobacter cloacae complex NOT DETECTED NOT DETECTED Final  Escherichia coli NOT DETECTED NOT DETECTED Final   Klebsiella aerogenes NOT DETECTED NOT DETECTED Final   Klebsiella oxytoca NOT DETECTED NOT DETECTED Final   Klebsiella pneumoniae NOT DETECTED NOT DETECTED Final   Proteus species NOT DETECTED NOT DETECTED Final   Salmonella species NOT DETECTED NOT DETECTED Final   Serratia marcescens NOT DETECTED NOT DETECTED Final   Haemophilus influenzae NOT DETECTED NOT DETECTED Final   Neisseria meningitidis NOT DETECTED NOT DETECTED Final   Pseudomonas aeruginosa NOT DETECTED NOT DETECTED Final   Stenotrophomonas maltophilia NOT DETECTED NOT DETECTED Final   Candida albicans NOT DETECTED NOT DETECTED Final   Candida auris NOT DETECTED NOT DETECTED Final   Candida glabrata NOT DETECTED NOT DETECTED Final   Candida krusei NOT DETECTED NOT DETECTED Final   Candida parapsilosis NOT DETECTED NOT DETECTED Final   Candida tropicalis NOT DETECTED NOT DETECTED Final   Cryptococcus neoformans/gattii NOT DETECTED NOT DETECTED Final    Comment: Performed at [facility], [place] [address], [place], [place] [address]  Culture, blood (single)     Status: None (Preliminary result)   Collection Time: 12/05/20  1:30 AM   Specimen: BLOOD  Result Value Ref Range Status   Specimen Description BLOOD SITE NOT SPECIFIED  Final   Special Requests   Final    BOTTLES DRAWN AEROBIC AND ANAEROBIC Blood Culture adequate volume   Culture   Final    NO GROWTH 2 DAYS Performed at [facility], [address], [place], [place] [address]    Report Status PENDING  Incomplete     Medications:    [MAR Hold] enoxaparin (LOVENOX) injection  50 mg Subcutaneous Q12H   [MAR Hold] ketorolac  15 mg Intravenous Q6H   [MAR Hold] methadone  20 mg Oral Daily   [MAR Hold] pantoprazole  40 mg Oral Daily   Continuous Infusions:  [MAR Hold] clindamycin (CLEOCIN)  IV 600 mg (12/08/20 0940)   [MAR Hold] penicillin g continuous IV infusion 12 Million Units (12/08/20 0456)      LOS: 3 days   Charlynne Cousins  [facility]  12/08/2020, 11:35 AM

## 2020-12-08 NOTE — Progress Notes (Signed)
This nurse went to assess for IV placement. The patient is restricted on the L d/t blood clot and the RFA the patient has 1 vein (no other vein noted) that has been attempted and refused to have a 2nd VAST RN attempt proximal to last attempt. Upper arm has no cephalic and unable to use brachial/basilic d/t possible PICC placement. This nurse notified Marcelino Duster RN. VU. Tomasita Morrow, RN VAST

## 2020-12-08 NOTE — Progress Notes (Signed)
TEE cancelled due to lack of IV access. Awaiting PICC/Midline placement per Dr Bjorn Pippin.

## 2020-12-08 NOTE — Interval H&P Note (Signed)
History and Physical Interval Note:  12/08/2020 10:20 AM  Summer Cain  has presented today for surgery, with the diagnosis of bacteremia.  The various methods of treatment have been discussed with the patient and family. After consideration of risks, benefits and other options for treatment, the patient has consented to  Procedure(s): TRANSESOPHAGEAL ECHOCARDIOGRAM (TEE) (N/A) as a surgical intervention.  The patient's history has been reviewed, patient examined, no change in status, stable for surgery.  I have reviewed the patient's chart and labs.  Questions were answered to the patient's satisfaction.     [name]

## 2020-12-08 NOTE — Anesthesia Postprocedure Evaluation (Signed)
Anesthesia Post Note  Patient: Music therapist  Procedure(s) Performed: INVASIVE LAB ABORTED CASE     Patient location during evaluation: PACU Anesthesia Type: MAC Level of consciousness: awake and alert Pain management: pain level controlled Vital Signs Assessment: post-procedure vital signs reviewed and stable Respiratory status: spontaneous breathing, nonlabored ventilation, respiratory function stable and patient connected to nasal cannula oxygen Cardiovascular status: stable and blood pressure returned to baseline Postop Assessment: no apparent nausea or vomiting Anesthetic complications: no Comments: Case canceled secondary to IV access   No notable events documented.  Last Vitals:  Vitals:   12/08/20 1146 12/08/20 1225  BP: 114/69 106/74  Pulse: (!) 52 62  Resp: 20 18  Temp: 36.7 C 36.7 C  SpO2: 98% 99%    Last Pain:  Vitals:   12/08/20 1225  TempSrc: Oral  PainSc:                  Summer Cain

## 2020-12-08 NOTE — Transfer of Care (Signed)
Immediate Anesthesia Transfer of Care Note  Patient: Summer Cain  Procedure(s) Performed: TRANSESOPHAGEAL ECHOCARDIOGRAM (TEE) INVASIVE LAB ABORTED CASE  Patient Location: Endoscopy Unit  Anesthesia Type:MAC  Level of Consciousness: awake, alert  and patient cooperative  Airway & Oxygen Therapy: Patient Spontanous Breathing  Post-op Assessment: Report given to RN and Post -op Vital signs reviewed and stable  Post vital signs: Reviewed and stable  Last Vitals:  Vitals Value Taken Time  BP 114/69 12/08/20 1146  Temp    Pulse 55 12/08/20 1150  Resp 20 12/08/20 1150  SpO2 98 % 12/08/20 1150  Vitals shown include unvalidated device data.  Last Pain:  Vitals:   12/08/20 1146  TempSrc:   PainSc: 0-No pain      Patients Stated Pain Goal: 1 (73/40/37 0964)  Complications: No notable events documented.

## 2020-12-08 NOTE — Anesthesia Preprocedure Evaluation (Signed)
Anesthesia Evaluation  Patient identified by MRN, date of birth, ID band Patient awake  General Assessment Comment:Sleepy but arousable and answers questions, very irritable  Reviewed: Allergy & Precautions, H&P , NPO status , Patient's Chart, lab work & pertinent test results  History of Anesthesia Complications Negative for: history of anesthetic complications  Airway Mallampati: II  TM Distance: >3 FB Neck ROM: Full    Dental  (+) Poor Dentition, Dental Advisory Given, Missing   Pulmonary neg pulmonary ROS, Current Smoker and Patient abstained from smoking.,  09/15/2019 SARS coronavirus NEG   breath sounds clear to auscultation       Cardiovascular negative cardio ROS   Rhythm:Regular Rate:Normal  07/2019 ECHO: EF 60-65%, no sig valvular abnormalities   Neuro/Psych PSYCHIATRIC DISORDERS Anxiety Depression Bipolar Disorder negative neurological ROS     GI/Hepatic negative GI ROS, (+)     substance abuse  alcohol use, cocaine use and IV drug use, Hepatitis -  Endo/Other  negative endocrine ROS  Renal/GU negative Renal ROS  negative genitourinary   Musculoskeletal negative musculoskeletal ROS (+) narcotic dependent  Abdominal   Peds negative pediatric ROS (+)  Hematology negative hematology ROS (+)   Anesthesia Other Findings   Reproductive/Obstetrics negative OB ROS                             Anesthesia Physical  Anesthesia Plan  ASA: 3  Anesthesia Plan: MAC   Post-op Pain Management: Minimal or no pain anticipated   Induction: Intravenous  PONV Risk Score and Plan: 2  Airway Management Planned: Natural Airway, Nasal Cannula, Simple Face Mask and Mask  Additional Equipment: None  Intra-op Plan:   Post-operative Plan:   Informed Consent: I have reviewed the patients History and Physical, chart, labs and discussed the procedure including the risks, benefits and  alternatives for the proposed anesthesia with the patient or authorized representative who has indicated his/her understanding and acceptance.     Dental advisory given  Plan Discussed with: CRNA and Anesthesiologist  Anesthesia Plan Comments:         Anesthesia Quick Evaluation

## 2020-12-09 ENCOUNTER — Encounter (HOSPITAL_COMMUNITY)
Admission: EM | Discharge status: Against Medical Advice | Payer: Self-pay | Source: Home / Self Care | Attending: Internal Medicine

## 2020-12-09 ENCOUNTER — Inpatient Hospital Stay: Payer: Self-pay

## 2020-12-09 DIAGNOSIS — F1990 Other psychoactive substance use, unspecified, uncomplicated: Secondary | ICD-10-CM | POA: Diagnosis not present

## 2020-12-09 DIAGNOSIS — E871 Hypo-osmolality and hyponatremia: Secondary | ICD-10-CM

## 2020-12-09 DIAGNOSIS — I079 Rheumatic tricuspid valve disease, unspecified: Secondary | ICD-10-CM | POA: Diagnosis present

## 2020-12-09 DIAGNOSIS — L03114 Cellulitis of left upper limb: Secondary | ICD-10-CM

## 2020-12-09 DIAGNOSIS — I330 Acute and subacute infective endocarditis: Secondary | ICD-10-CM | POA: Insufficient documentation

## 2020-12-09 DIAGNOSIS — I059 Rheumatic mitral valve disease, unspecified: Secondary | ICD-10-CM | POA: Diagnosis present

## 2020-12-09 DIAGNOSIS — I809 Phlebitis and thrombophlebitis of unspecified site: Secondary | ICD-10-CM

## 2020-12-09 DIAGNOSIS — I82A12 Acute embolism and thrombosis of left axillary vein: Secondary | ICD-10-CM | POA: Diagnosis not present

## 2020-12-09 DIAGNOSIS — I82622 Acute embolism and thrombosis of deep veins of left upper extremity: Secondary | ICD-10-CM | POA: Diagnosis not present

## 2020-12-09 LAB — HEPATITIS C GENOTYPE

## 2020-12-09 LAB — HCV RNA QUANT RFLX ULTRA OR GENOTYP
HCV RNA Qnt(log copy/mL): 6.992 log10 IU/mL
HepC Qn: 9820000 IU/mL

## 2020-12-09 LAB — CBC
HCT: 35.9 % — ABNORMAL LOW (ref 36.0–46.0)
Hemoglobin: 11.4 g/dL — ABNORMAL LOW (ref 12.0–15.0)
MCH: 27.1 pg (ref 26.0–34.0)
MCHC: 31.8 g/dL (ref 30.0–36.0)
MCV: 85.5 fL (ref 80.0–100.0)
Platelets: 363 10*3/uL (ref 150–400)
RBC: 4.2 MIL/uL (ref 3.87–5.11)
RDW: 14.6 % (ref 11.5–15.5)
WBC: 10.7 10*3/uL — ABNORMAL HIGH (ref 4.0–10.5)
nRBC: 0 % (ref 0.0–0.2)

## 2020-12-09 SURGERY — MRI WITH ANESTHESIA
Anesthesia: General

## 2020-12-09 MED ORDER — KETOROLAC TROMETHAMINE 15 MG/ML IJ SOLN
15.0000 mg | Freq: Four times a day (QID) | INTRAMUSCULAR | Status: DC | PRN
  Administered 2020-12-09: 15 mg via INTRAMUSCULAR
  Filled 2020-12-09: qty 1

## 2020-12-09 MED ORDER — OXYCODONE HCL 5 MG PO TABS
5.0000 mg | ORAL_TABLET | ORAL | Status: DC | PRN
  Administered 2020-12-09 – 2020-12-24 (×42): 5 mg via ORAL
  Filled 2020-12-09 (×44): qty 1

## 2020-12-09 MED ORDER — DIPHENHYDRAMINE HCL 25 MG PO CAPS
25.0000 mg | ORAL_CAPSULE | Freq: Every evening | ORAL | Status: DC | PRN
  Administered 2020-12-09: 25 mg via ORAL
  Filled 2020-12-09: qty 1

## 2020-12-09 MED ORDER — IBUPROFEN 600 MG PO TABS
600.0000 mg | ORAL_TABLET | Freq: Four times a day (QID) | ORAL | Status: DC | PRN
  Administered 2020-12-09 – 2020-12-14 (×8): 600 mg via ORAL
  Filled 2020-12-09 (×10): qty 1

## 2020-12-09 MED ORDER — PANTOPRAZOLE SODIUM 40 MG PO TBEC
40.0000 mg | DELAYED_RELEASE_TABLET | Freq: Two times a day (BID) | ORAL | Status: DC
  Administered 2020-12-09 – 2020-12-17 (×16): 40 mg via ORAL
  Filled 2020-12-09 (×16): qty 1

## 2020-12-09 MED ORDER — KETOROLAC TROMETHAMINE 15 MG/ML IJ SOLN: 15.0000 mg | Freq: Four times a day (QID) | INTRAMUSCULAR | Status: DC | PRN

## 2020-12-09 MED ORDER — SODIUM CHLORIDE 0.9% FLUSH
10.0000 mL | Freq: Two times a day (BID) | INTRAVENOUS | Status: DC
  Administered 2020-12-09 – 2020-12-16 (×12): 10 mL
  Administered 2020-12-16: 20 mL
  Administered 2020-12-16 – 2020-12-27 (×16): 10 mL

## 2020-12-09 MED ORDER — SODIUM CHLORIDE 0.9% FLUSH: 10.0000 mL | INTRAVENOUS | Status: DC | PRN

## 2020-12-09 MED ORDER — DIPHENHYDRAMINE HCL 25 MG PO CAPS
25.0000 mg | ORAL_CAPSULE | Freq: Four times a day (QID) | ORAL | Status: DC | PRN
  Administered 2020-12-09 – 2020-12-19 (×31): 25 mg via ORAL
  Filled 2020-12-09 (×32): qty 1

## 2020-12-09 MED ORDER — LINEZOLID 600 MG PO TABS
600.0000 mg | ORAL_TABLET | Freq: Two times a day (BID) | ORAL | Status: DC
  Filled 2020-12-09 (×2): qty 1

## 2020-12-09 MED ORDER — CHLORHEXIDINE GLUCONATE CLOTH 2 % EX PADS
6.0000 | MEDICATED_PAD | Freq: Every day | CUTANEOUS | Status: DC
  Administered 2020-12-11 – 2020-12-27 (×16): 6 via TOPICAL

## 2020-12-09 NOTE — Progress Notes (Signed)
[facility] PROGRESS NOTE    Progress Note  Summer Cain  [id number] DOB: October 25, 1985 DOA: 12/04/2020 PCP: Patient, No Pcp Per (Inactive)     Brief Narrative:   Summer Cain is an 35 y.o. female past medical history of IV drug abuse, opiate dependence hepatitis C with mitral valve vegetation on TTE July 2021, her cultures at that time were positive for Pseudomonas, MRSA and Serratia but unfortunately she left AMA comes into the ED for 1 week of left arm pain associated with swelling and redness she also has a lump on her chest wall and swollen lymph nodes.  She also relates scant hemoptysis and shortness of breath left upper extremity CT showed a DVT no abscess CT of the chest showed no PE or pneumonia started empirically on Vanco and Rocephin in the ED blood cultures were obtained showed strep pyogenes surveillance blood cultures negative.  2D echo showed multiple valve endocarditis, TEE was ordered, he is currently on IV penicillin and clindamycin CT of the maxillofacial showed no acute findings.  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  12/08/2020 TEE pending   Assessment/Plan:   Acute deep vein thrombosis (DVT) of left upper extremity ([place]): Provoked likely due to IV drug abuse was started on heparin. CT of the chest showedLong segment venous thrombus extending into the left axillary vein at the junction of the subclavian vein  Currently on methadone and oxycodone  Sepsis likely due to left upper extremity cellulitis: Blood cultures 11/18/2022strep pyogenes. Surveillance blood cultures from [id number] have been negative, we will go ahead and insert PICC line. TEE scheduled for 12/10/2020. Currently on methadone and oxy CT surgery has been notified they recommended a TEE,  Infectious disease recommended MRI of the cervical and lumbar spine which the patient refused  to get, we have her scheduled for 12/09/2020 under anesthesia. Continue IV penicillin and clindamycin.  Cavitary lung lesion/pulmonary embolism: Blood cultures were obtained, continue empirically on penicillin and clindamycin.  Hyponatremia probably hypovolemic: Resolved with IV fluid hydration.  Hepatitis C: Will need follow-up as an outpatient.  IV drug abuse: Continue methadone and hydromorphone, I have explained to her that we will wean her down over IV Dilaudid which she did not take well.  Not in a good mood this morning.  DVT prophylaxis: IV heparin Family Communication:none Status is: Inpatient  Remains inpatient appropriate because: Acute DVT with upper extremity cellulitis concern for endocarditis    Code Status:     Code Status Orders  (From admission, onward)           Start     Ordered   12/05/20 0620  Full code  Continuous        12/05/20 0621           Code Status History     Date Active Date Inactive Code Status Order ID Comments User Context   09/15/2019 1509 09/20/2019 2224 Full Code [id number]  Delice Bison, DO ED   08/11/2019 0147 08/13/2019 1529 Full Code [id number]  Mansy, Arvella Merles, MD ED   04/01/2019 1108 04/04/2019 0303 Full Code [id number]  Jean Rosenthal, MD ED   05/22/2018 0203 05/24/2018 1759 Full Code [id number]  Donnamae Jude, MD Inpatient   04/08/2017 0942 04/10/2017 2153 Full Code [id number]  Christin Fudge, [place] Inpatient   04/08/2017 0729 04/08/2017 [place] Full Code [id number]  Christin Fudge, [place] Inpatient   11/07/2015 0120 11/08/2015  [place] Full Code [id number]  Darlina Rumpf Inpatient   11/06/2015 1033 11/07/2015 0120 Full Code [id number]  Myrtis Ser, CNM Inpatient         IV Access:   Peripheral IV   Procedures and diagnostic studies:   No results found.   Medical Consultants:   None.   Subjective:    Dayan Shingledecker no complaints  Objective:    Vitals:   12/08/20 1942 12/08/20 2305  12/09/20 0347 12/09/20 0755  BP: 101/70 93/64 93/67  105/64  Pulse: 65 63 65 (!) 55  Resp: 18 18 14 14   Temp: 98.4 F (36.9 C) 98.6 F (37 C) 97.9 F (36.6 C) 97.7 F (36.5 C)  TempSrc: Oral Oral  Oral  SpO2: 96% 96% 98% 95%  Weight:      Height:       SpO2: 95 %   Intake/Output Summary (Last 24 hours) at 12/09/2020 0813 Last data filed at 12/08/2020 1553 Gross per 24 hour  Intake 290 ml  Output 300 ml  Net -10 ml    Filed Weights   12/07/20 0500 12/08/20 0619 12/08/20 1013  Weight: 50.8 kg 49.6 kg 49.6 kg    Exam: General exam: In no acute distress. Respiratory system: Good air movement and clear to auscultation. Cardiovascular system: S1 & S2 heard, RRR. No JVD. Gastrointestinal system: Abdomen is nondistended, soft and nontender.  Extremities: No pedal edema. Skin: No rashes, lesions or ulcers Psychiatry: Judgement and insight appear normal. Mood & affect appropriate.   Data Reviewed:    Labs: Basic Metabolic Panel: Recent Labs  Lab 12/04/20 2108 12/07/20 0126  NA 127* 136  K 4.0 4.2  CL 91* 107  CO2 25 23  GLUCOSE 115* 90  BUN 14 7  CREATININE 0.72 0.59  CALCIUM 8.3* 7.7*    GFR Estimated Creatinine Clearance: 71.8 mL/min (by C-G formula based on SCr of 0.59 mg/dL). Liver Function Tests: Recent Labs  Lab 12/04/20 2108 12/07/20 1045  AST 20  --   ALT 17  --   ALKPHOS 44  --   BILITOT 0.7  --   PROT 6.7  --   ALBUMIN 2.6* 1.8*    No results for input(s): LIPASE, AMYLASE in the last 168 hours. No results for input(s): AMMONIA in the last 168 hours. Coagulation profile Recent Labs  Lab 12/04/20 2108  INR 1.2    COVID-19 Labs  No results for input(s): DDIMER, FERRITIN, LDH, CRP in the last 72 hours.  Lab Results  Component Value Date   SARSCOV2NAA NEGATIVE 12/04/2020   SARSCOV2NAA NEGATIVE 09/15/2019   [place] NEGATIVE 08/10/2019   [place] NEGATIVE 04/03/2019    CBC: Recent Labs  Lab 12/04/20 2108 12/07/20 0126  12/09/20 0144  WBC 15.9* 7.8 10.7*  NEUTROABS 12.5*  --   --   HGB 11.0* 10.5* 11.4*  HCT 32.8* 32.9* 35.9*  MCV 83.7 85.0 85.5  PLT 181 188 363    Cardiac Enzymes: No results for input(s): CKTOTAL, CKMB, CKMBINDEX, TROPONINI in the last 168 hours. BNP (last 3 results) No results for input(s): PROBNP in the last 8760 hours. CBG: No results for input(s): GLUCAP in the last 168 hours. D-Dimer: No results for input(s): DDIMER in the last 72 hours. Hgb A1c: No results for input(s): HGBA1C in the last 72 hours. Lipid Profile: No results for input(s): CHOL, HDL, LDLCALC, TRIG, CHOLHDL, LDLDIRECT in the last 72 hours. Thyroid function studies: No results for input(s): TSH, T4TOTAL, T3FREE, THYROIDAB in the last  72 hours.  Invalid input(s): FREET3 Anemia work up: No results for input(s): VITAMINB12, FOLATE, FERRITIN, TIBC, IRON, RETICCTPCT in the last 72 hours. Sepsis Labs: Recent Labs  Lab 12/04/20 2108 12/05/20 0927 12/07/20 0126 12/09/20 0144  PROCALCITON  --  0.52  --   --   WBC 15.9*  --  7.8 10.7*  LATICACIDVEN 1.1  --   --   --     Microbiology Recent Results (from the past 240 hour(s))  Resp Panel by RT-PCR (Flu A&B, Covid) Peripheral     Status: None   Collection Time: 12/04/20  8:52 PM   Specimen: Peripheral; Nasopharyngeal(NP) swabs in vial transport medium  Result Value Ref Range Status   SARS Coronavirus 2 by RT PCR NEGATIVE NEGATIVE Final    Comment: (NOTE) SARS-CoV-2 target nucleic acids are NOT DETECTED.  The SARS-CoV-2 RNA is generally detectable in upper respiratory specimens during the acute phase of infection. The lowest concentration of SARS-CoV-2 viral copies this assay can detect is 138 copies/mL. A negative result does not preclude SARS-Cov-2 infection and should not be used as the sole basis for treatment or other patient management decisions. A negative result may occur with  improper specimen collection/handling, submission of specimen  other than nasopharyngeal swab, presence of viral mutation(s) within the areas targeted by this assay, and inadequate number of viral copies(<138 copies/mL). A negative result must be combined with clinical observations, patient history, and epidemiological information. The expected result is Negative.  Fact Sheet for Patients:  BloggerCourse.com  Fact Sheet for Healthcare Providers:  SeriousBroker.it  This test is no t yet approved or cleared by the [place] FDA and  has been authorized for detection and/or diagnosis of SARS-CoV-2 by FDA under an Emergency Use Authorization (EUA). This EUA will remain  in effect (meaning this test can be used) for the duration of the COVID-19 declaration under Section 564(b)(1) of the Act, 21 U.S.C.section 360bbb-3(b)(1), unless the authorization is terminated  or revoked sooner.       Influenza A by PCR NEGATIVE NEGATIVE Final   Influenza B by PCR NEGATIVE NEGATIVE Final    Comment: (NOTE) The Xpert Xpress SARS-CoV-2/FLU/RSV plus assay is intended as an aid in the diagnosis of influenza from Nasopharyngeal swab specimens and should not be used as a sole basis for treatment. Nasal washings and aspirates are unacceptable for Xpert Xpress SARS-CoV-2/FLU/RSV testing.  Fact Sheet for Patients: BloggerCourse.com  Fact Sheet for Healthcare Providers: SeriousBroker.it  This test is not yet approved or cleared by the [place] FDA and has been authorized for detection and/or diagnosis of SARS-CoV-2 by FDA under an Emergency Use Authorization (EUA). This EUA will remain in effect (meaning this test can be used) for the duration of the COVID-19 declaration under Section 564(b)(1) of the Act, 21 U.S.C. section 360bbb-3(b)(1), unless the authorization is terminated or revoked.  Performed at [facility], [address] [place],  [place] [address]   Blood Culture (routine x 2)     Status: Abnormal   Collection Time: 12/04/20  9:08 PM   Specimen: BLOOD RIGHT HAND  Result Value Ref Range Status   Specimen Description BLOOD RIGHT HAND  Final   Special Requests   Final    BOTTLES DRAWN AEROBIC AND ANAEROBIC Blood Culture adequate volume   Culture  Setup Time   Final    GRAM POSITIVE COCCI IN CHAINS IN PAIRS AEROBIC BOTTLE ONLY Organism ID to follow CRITICAL RESULT CALLED TO, READ BACK BY AND  VERIFIED WITHJiles Garter [facility] PHARMD [id number] 12/05/20 A BROWNING    Culture (A)  Final    GROUP A STREP (S.PYOGENES) ISOLATED HEALTH DEPARTMENT NOTIFIED Performed at [facility], [address], [place], [place] [address]    Report Status 12/08/2020 FINAL  Final   Organism ID, Bacteria GROUP A STREP (S.PYOGENES) ISOLATED  Final      Susceptibility   Group a strep (s.pyogenes) isolated - MIC*    PENICILLIN <=0.06 SENSITIVE Sensitive     CEFTRIAXONE <=0.12 SENSITIVE Sensitive     ERYTHROMYCIN >=8 RESISTANT Resistant     LEVOFLOXACIN <=0.25 SENSITIVE Sensitive     VANCOMYCIN 0.25 SENSITIVE Sensitive     * GROUP A STREP (S.PYOGENES) ISOLATED  Blood Culture (routine x 2)     Status: Abnormal   Collection Time: 12/04/20  9:08 PM   Specimen: BLOOD RIGHT ARM  Result Value Ref Range Status   Specimen Description BLOOD RIGHT ARM  Final   Special Requests   Final    BOTTLES DRAWN AEROBIC ONLY Blood Culture adequate volume   Culture  Setup Time   Final    GRAM POSITIVE COCCI IN CHAINS AEROBIC BOTTLE ONLY CRITICAL VALUE NOTED.  VALUE IS CONSISTENT WITH PREVIOUSLY REPORTED AND CALLED VALUE.    Culture (A)  Final    GROUP A STREP (S.PYOGENES) ISOLATED SUSCEPTIBILITIES PERFORMED ON PREVIOUS CULTURE WITHIN THE LAST 5 DAYS. HEALTH DEPARTMENT NOTIFIED Performed at [facility], [place] [address], [place],  [address]    Report Status 12/08/2020 FINAL  Final  Blood Culture ID Panel (Reflexed)     Status: Abnormal   Collection  Time: 12/04/20  9:08 PM  Result Value Ref Range Status   Enterococcus faecalis NOT DETECTED NOT DETECTED Final   Enterococcus Faecium NOT DETECTED NOT DETECTED Final   Listeria monocytogenes NOT DETECTED NOT DETECTED Final   Staphylococcus species NOT DETECTED NOT DETECTED Final   Staphylococcus aureus (BCID) NOT DETECTED NOT DETECTED Final   Staphylococcus epidermidis NOT DETECTED NOT DETECTED Final   Staphylococcus lugdunensis NOT DETECTED NOT DETECTED Final   Streptococcus species DETECTED (A) NOT DETECTED Final    Comment: CRITICAL RESULT CALLED TO, READ BACK BY AND VERIFIED WITHJiles Garter [facility] PHARMD [id number] 12/05/20 A BROWNING    Streptococcus agalactiae NOT DETECTED NOT DETECTED Final   Streptococcus pneumoniae NOT DETECTED NOT DETECTED Final   Streptococcus pyogenes DETECTED (A) NOT DETECTED Final    Comment: CRITICAL RESULT CALLED TO, READ BACK BY AND VERIFIED WITHJiles Garter [facility] PHARMD [id number] 12/05/20 A BROWNING    A.calcoaceticus-baumannii NOT DETECTED NOT DETECTED Final   Bacteroides fragilis NOT DETECTED NOT DETECTED Final   Enterobacterales NOT DETECTED NOT DETECTED Final   Enterobacter cloacae complex NOT DETECTED NOT DETECTED Final   Escherichia coli NOT DETECTED NOT DETECTED Final   Klebsiella aerogenes NOT DETECTED NOT DETECTED Final   Klebsiella oxytoca NOT DETECTED NOT DETECTED Final   Klebsiella pneumoniae NOT DETECTED NOT DETECTED Final   Proteus species NOT DETECTED NOT DETECTED Final   Salmonella species NOT DETECTED NOT DETECTED Final   Serratia marcescens NOT DETECTED NOT DETECTED Final   Haemophilus influenzae NOT DETECTED NOT DETECTED Final   Neisseria meningitidis NOT DETECTED NOT DETECTED Final   Pseudomonas aeruginosa NOT DETECTED NOT DETECTED Final   Stenotrophomonas maltophilia NOT DETECTED NOT DETECTED Final   Candida albicans NOT DETECTED NOT DETECTED Final   Candida auris NOT DETECTED NOT DETECTED Final   Candida glabrata NOT DETECTED NOT  DETECTED  Final   Candida krusei NOT DETECTED NOT DETECTED Final   Candida parapsilosis NOT DETECTED NOT DETECTED Final   Candida tropicalis NOT DETECTED NOT DETECTED Final   Cryptococcus neoformans/gattii NOT DETECTED NOT DETECTED Final    Comment: Performed at [facility], [place] [address], [place], [place] [address]  Culture, blood (single)     Status: None (Preliminary result)   Collection Time: 12/05/20  1:30 AM   Specimen: BLOOD  Result Value Ref Range Status   Specimen Description BLOOD SITE NOT SPECIFIED  Final   Special Requests   Final    BOTTLES DRAWN AEROBIC AND ANAEROBIC Blood Culture adequate volume   Culture   Final    NO GROWTH 4 DAYS Performed at [facility], [address], [place], [place] [address]    Report Status PENDING  Incomplete  Culture, blood (Routine X 2) w Reflex to ID Panel     Status: None (Preliminary result)   Collection Time: 12/07/20  1:02 AM   Specimen: BLOOD  Result Value Ref Range Status   Specimen Description BLOOD RIGHT HAND  Final   Special Requests   Final    BOTTLES DRAWN AEROBIC ONLY Blood Culture results may not be optimal due to an inadequate volume of blood received in culture bottles   Culture   Final    NO GROWTH 2 DAYS Performed at [facility], [place] [address], [place], [place] [address]    Report Status PENDING  Incomplete  Culture, blood (Routine X 2) w Reflex to ID Panel     Status: None (Preliminary result)   Collection Time: 12/07/20 10:22 AM   Specimen: BLOOD RIGHT HAND  Result Value Ref Range Status   Specimen Description BLOOD RIGHT HAND  Final   Special Requests   Final    BOTTLES DRAWN AEROBIC ONLY Blood Culture results may not be optimal due to an inadequate volume of blood received in culture bottles   Culture   Final    NO GROWTH 2 DAYS Performed at [facility], [place] [address] [place], [place] [address]    Report Status PENDING  Incomplete     Medications:    enoxaparin (LOVENOX) injection   50 mg Subcutaneous Q12H   methadone  20 mg Oral Daily   pantoprazole  40 mg Oral Daily   Continuous Infusions:  clindamycin (CLEOCIN) IV 600 mg (12/08/20 0940)   penicillin g continuous IV infusion Stopped (12/08/20 1210)      LOS: 4 days   Charlynne Cousins  [facility]  12/09/2020, 8:13 AM

## 2020-12-09 NOTE — H&P (View-Only) (Signed)
Subjective:  Chest pain    Antibiotics:  Anti-infectives (From admission, onward)    Start     Dose/Rate Route Frequency Ordered Stop   12/09/20 1000  linezolid (ZYVOX) tablet 600 mg        600 mg Oral Every 12 hours 12/09/20 0914     12/06/20 0800  cefTRIAXone (ROCEPHIN) 2 g in sodium chloride 0.9 % 100 mL IVPB  Status:  Discontinued        2 g 200 mL/hr over 30 Minutes Intravenous Every 24 hours 12/05/20 0621 12/05/20 0629   12/05/20 2200  penicillin G potassium 12 Million Units in dextrose 5 % 500 mL continuous infusion        12 Million Units 41.7 mL/hr over 12 Hours Intravenous Every 12 hours 12/05/20 1711     12/05/20 2000  vancomycin (VANCOCIN) IVPB 750 mg/150 ml premix  Status:  Discontinued        750 mg 150 mL/hr over 60 Minutes Intravenous Every 12 hours 12/05/20 0635 12/05/20 1242   12/05/20 2000  vancomycin (VANCOCIN) 500 mg in sodium chloride 0.9 % 100 mL IVPB  Status:  Discontinued        500 mg 100 mL/hr over 60 Minutes Intravenous Every 12 hours 12/05/20 1242 12/05/20 1711   12/05/20 1800  clindamycin (CLEOCIN) IVPB 600 mg  Status:  Discontinued        600 mg 100 mL/hr over 30 Minutes Intravenous Every 8 hours 12/05/20 1712 12/09/20 0914   12/05/20 0800  ceFEPIme (MAXIPIME) 2 g in sodium chloride 0.9 % 100 mL IVPB  Status:  Discontinued        2 g 200 mL/hr over 30 Minutes Intravenous Every 8 hours 12/05/20 0630 12/05/20 1711   12/05/20 0100  vancomycin (VANCOCIN) IVPB 1000 mg/200 mL premix        1,000 mg 200 mL/hr over 60 Minutes Intravenous  Once 12/05/20 0051 12/05/20 0734   12/05/20 0100  cefTRIAXone (ROCEPHIN) 2 g in sodium chloride 0.9 % 100 mL IVPB        2 g 200 mL/hr over 30 Minutes Intravenous  Once 12/05/20 0051 12/05/20 0535       Medications: Scheduled Meds:  enoxaparin (LOVENOX) injection  50 mg Subcutaneous Q12H   linezolid  600 mg Oral Q12H   methadone  20 mg Oral Daily   pantoprazole  40 mg Oral Daily   Continuous  Infusions:  penicillin g continuous IV infusion Stopped (12/08/20 1210)   PRN Meds:.acetaminophen **OR** acetaminophen, diphenhydrAMINE, loperamide, ondansetron **OR** ondansetron (ZOFRAN) IV, oxyCODONE    Objective: Weight change: 0 kg  Intake/Output Summary (Last 24 hours) at 12/09/2020 0950 Last data filed at 12/08/2020 1553 Gross per 24 hour  Intake 290 ml  Output 300 ml  Net -10 ml   Blood pressure 105/64, pulse (!) 55, temperature 97.7 F (36.5 C), temperature source Oral, resp. rate 14, height 5' (1.524 m), weight 49.6 kg, SpO2 95 %. Temp:  [97.7 F (36.5 C)-98.6 F (37 C)] 97.7 F (36.5 C) (11/22 0755) Pulse Rate:  [52-74] 55 (11/22 0755) Resp:  [14-20] 14 (11/22 0755) BP: (93-124)/(64-97) 105/64 (11/22 0755) SpO2:  [95 %-99 %] 95 % (11/22 0755) Weight:  [49.6 kg] 49.6 kg (11/21 1013)  Physical Exam: Physical Exam Constitutional:      Appearance: She is ill-appearing.  HENT:     Head: Normocephalic and atraumatic.  Cardiovascular:     Rate and Rhythm: Tachycardia present.  Heart sounds: Murmur heard.  Pulmonary:     Effort: Pulmonary effort is normal. No respiratory distress.     Breath sounds: No wheezing or rales.  Abdominal:     General: Abdomen is flat. Bowel sounds are normal.  Neurological:     General: No focal deficit present.     Mental Status: She is alert and oriented to person, place, and time.  Psychiatric:        Attention and Perception: Attention normal.        Mood and Affect: Mood is depressed.        Speech: Speech normal.        Behavior: Behavior normal. Behavior is cooperative.        Thought Content: Thought content normal.        Cognition and Memory: Cognition and memory normal.        Judgment: Judgment normal.     CBC:    BMET Recent Labs    12/07/20 0126  NA 136  K 4.2  CL 107  CO2 23  GLUCOSE 90  BUN 7  CREATININE 0.59  CALCIUM 7.7*     Liver Panel  Recent Labs    12/07/20 1045  ALBUMIN 1.8*        Sedimentation Rate No results for input(s): ESRSEDRATE in the last 72 hours. C-Reactive Protein No results for input(s): CRP in the last 72 hours.  Micro Results: Recent Results (from the past 720 hour(s))  Resp Panel by RT-PCR (Flu A&B, Covid) Peripheral     Status: None   Collection Time: 12/04/20  8:52 PM   Specimen: Peripheral; Nasopharyngeal(NP) swabs in vial transport medium  Result Value Ref Range Status   SARS Coronavirus 2 by RT PCR NEGATIVE NEGATIVE Final    Comment: (NOTE) SARS-CoV-2 target nucleic acids are NOT DETECTED.  The SARS-CoV-2 RNA is generally detectable in upper respiratory specimens during the acute phase of infection. The lowest concentration of SARS-CoV-2 viral copies this assay can detect is 138 copies/mL. A negative result does not preclude SARS-Cov-2 infection and should not be used as the sole basis for treatment or other patient management decisions. A negative result may occur with  improper specimen collection/handling, submission of specimen other than nasopharyngeal swab, presence of viral mutation(s) within the areas targeted by this assay, and inadequate number of viral copies(<138 copies/mL). A negative result must be combined with clinical observations, patient history, and epidemiological information. The expected result is Negative.  Fact Sheet for Patients:  EntrepreneurPulse.com.au  Fact Sheet for Healthcare Providers:  IncredibleEmployment.be  This test is no t yet approved or cleared by the [place] FDA and  has been authorized for detection and/or diagnosis of SARS-CoV-2 by FDA under an Emergency Use Authorization (EUA). This EUA will remain  in effect (meaning this test can be used) for the duration of the COVID-19 declaration under Section 564(b)(1) of the Act, 21 U.S.C.section 360bbb-3(b)(1), unless the authorization is terminated  or revoked sooner.       Influenza A by  PCR NEGATIVE NEGATIVE Final   Influenza B by PCR NEGATIVE NEGATIVE Final    Comment: (NOTE) The Xpert Xpress SARS-CoV-2/FLU/RSV plus assay is intended as an aid in the diagnosis of influenza from Nasopharyngeal swab specimens and should not be used as a sole basis for treatment. Nasal washings and aspirates are unacceptable for Xpert Xpress SARS-CoV-2/FLU/RSV testing.  Fact Sheet for Patients: EntrepreneurPulse.com.au  Fact Sheet for Healthcare Providers: IncredibleEmployment.be  This test is not yet  approved or cleared by the [place] and has been authorized for detection and/or diagnosis of SARS-CoV-2 by FDA under an Emergency Use Authorization (EUA). This EUA will remain in effect (meaning this test can be used) for the duration of the COVID-19 declaration under Section 564(b)(1) of the Act, 21 U.S.C. section 360bbb-3(b)(1), unless the authorization is terminated or revoked.  Performed at [facility], [address] [place], [place] [address]   Blood Culture (routine x 2)     Status: Abnormal   Collection Time: 12/04/20  9:08 PM   Specimen: BLOOD RIGHT HAND  Result Value Ref Range Status   Specimen Description BLOOD RIGHT HAND  Final   Special Requests   Final    BOTTLES DRAWN AEROBIC AND ANAEROBIC Blood Culture adequate volume   Culture  Setup Time   Final    GRAM POSITIVE COCCI IN CHAINS IN PAIRS AEROBIC BOTTLE ONLY Organism ID to follow CRITICAL RESULT CALLED TO, READ BACK BY AND VERIFIED WITHCandiss Norse [facility] PHARMD [id number] 12/05/20 A BROWNING    Culture (A)  Final    GROUP A STREP (S.PYOGENES) ISOLATED HEALTH DEPARTMENT NOTIFIED Performed at [facility], [address], [place], [place] [address]    Report Status 12/08/2020 FINAL  Final   Organism ID, Bacteria GROUP A STREP (S.PYOGENES) ISOLATED  Final      Susceptibility   Group a strep (s.pyogenes) isolated - MIC*    PENICILLIN <=0.06 SENSITIVE Sensitive      CEFTRIAXONE <=0.12 SENSITIVE Sensitive     ERYTHROMYCIN >=8 RESISTANT Resistant     LEVOFLOXACIN <=0.25 SENSITIVE Sensitive     VANCOMYCIN 0.25 SENSITIVE Sensitive     * GROUP A STREP (S.PYOGENES) ISOLATED  Blood Culture (routine x 2)     Status: Abnormal   Collection Time: 12/04/20  9:08 PM   Specimen: BLOOD RIGHT ARM  Result Value Ref Range Status   Specimen Description BLOOD RIGHT ARM  Final   Special Requests   Final    BOTTLES DRAWN AEROBIC ONLY Blood Culture adequate volume   Culture  Setup Time   Final    GRAM POSITIVE COCCI IN CHAINS AEROBIC BOTTLE ONLY CRITICAL VALUE NOTED.  VALUE IS CONSISTENT WITH PREVIOUSLY REPORTED AND CALLED VALUE.    Culture (A)  Final    GROUP A STREP (S.PYOGENES) ISOLATED SUSCEPTIBILITIES PERFORMED ON PREVIOUS CULTURE WITHIN THE LAST 5 DAYS. HEALTH DEPARTMENT NOTIFIED Performed at [facility], [address] [place], [place], [place] [address]    Report Status 12/08/2020 FINAL  Final  Blood Culture ID Panel (Reflexed)     Status: Abnormal   Collection Time: 12/04/20  9:08 PM  Result Value Ref Range Status   Enterococcus faecalis NOT DETECTED NOT DETECTED Final   Enterococcus Faecium NOT DETECTED NOT DETECTED Final   Listeria monocytogenes NOT DETECTED NOT DETECTED Final   Staphylococcus species NOT DETECTED NOT DETECTED Final   Staphylococcus aureus (BCID) NOT DETECTED NOT DETECTED Final   Staphylococcus epidermidis NOT DETECTED NOT DETECTED Final   Staphylococcus lugdunensis NOT DETECTED NOT DETECTED Final   Streptococcus species DETECTED (A) NOT DETECTED Final    Comment: CRITICAL RESULT CALLED TO, READ BACK BY AND VERIFIED WITHCandiss Norse [facility] PHARMD [id number] 12/05/20 A BROWNING    Streptococcus agalactiae NOT DETECTED NOT DETECTED Final   Streptococcus pneumoniae NOT DETECTED NOT DETECTED Final   Streptococcus pyogenes DETECTED (A) NOT DETECTED Final    Comment: CRITICAL RESULT CALLED TO, READ BACK BY  AND VERIFIED WITHJiles Garter [facility] PHARMD  [id number] 12/05/20 A BROWNING    A.calcoaceticus-baumannii NOT DETECTED NOT DETECTED Final   Bacteroides fragilis NOT DETECTED NOT DETECTED Final   Enterobacterales NOT DETECTED NOT DETECTED Final   Enterobacter cloacae complex NOT DETECTED NOT DETECTED Final   Escherichia coli NOT DETECTED NOT DETECTED Final   Klebsiella aerogenes NOT DETECTED NOT DETECTED Final   Klebsiella oxytoca NOT DETECTED NOT DETECTED Final   Klebsiella pneumoniae NOT DETECTED NOT DETECTED Final   Proteus species NOT DETECTED NOT DETECTED Final   Salmonella species NOT DETECTED NOT DETECTED Final   Serratia marcescens NOT DETECTED NOT DETECTED Final   Haemophilus influenzae NOT DETECTED NOT DETECTED Final   Neisseria meningitidis NOT DETECTED NOT DETECTED Final   Pseudomonas aeruginosa NOT DETECTED NOT DETECTED Final   Stenotrophomonas maltophilia NOT DETECTED NOT DETECTED Final   Candida albicans NOT DETECTED NOT DETECTED Final   Candida auris NOT DETECTED NOT DETECTED Final   Candida glabrata NOT DETECTED NOT DETECTED Final   Candida krusei NOT DETECTED NOT DETECTED Final   Candida parapsilosis NOT DETECTED NOT DETECTED Final   Candida tropicalis NOT DETECTED NOT DETECTED Final   Cryptococcus neoformans/gattii NOT DETECTED NOT DETECTED Final    Comment: Performed at [facility], [place]. [address], [place], [place] [address]  Culture, blood (single)     Status: None (Preliminary result)   Collection Time: 12/05/20  1:30 AM   Specimen: BLOOD  Result Value Ref Range Status   Specimen Description BLOOD SITE NOT SPECIFIED  Final   Special Requests   Final    BOTTLES DRAWN AEROBIC AND ANAEROBIC Blood Culture adequate volume   Culture   Final    NO GROWTH 4 DAYS Performed at [facility], [address], [place], [place] [address]    Report Status PENDING  Incomplete  Culture, blood (Routine X 2) w Reflex to ID Panel     Status: None (Preliminary result)   Collection Time: 12/07/20  1:02 AM   Specimen:  BLOOD  Result Value Ref Range Status   Specimen Description BLOOD RIGHT HAND  Final   Special Requests   Final    BOTTLES DRAWN AEROBIC ONLY Blood Culture results may not be optimal due to an inadequate volume of blood received in culture bottles   Culture   Final    NO GROWTH 2 DAYS Performed at [facility], [place] [address], [place], [place] [address]    Report Status PENDING  Incomplete  Culture, blood (Routine X 2) w Reflex to ID Panel     Status: None (Preliminary result)   Collection Time: 12/07/20 10:22 AM   Specimen: BLOOD RIGHT HAND  Result Value Ref Range Status   Specimen Description BLOOD RIGHT HAND  Final   Special Requests   Final    BOTTLES DRAWN AEROBIC ONLY Blood Culture results may not be optimal due to an inadequate volume of blood received in culture bottles   Culture   Final    NO GROWTH 2 DAYS Performed at [facility], [place] [address], [place], [place] [address]    Report Status PENDING  Incomplete    Studies/Results: [place] EKG SITE RITE  Result Date: 12/09/2020 If Site Rite image not attached, placement could not be confirmed due to current cardiac rhythm.     Assessment/Plan:  INTERVAL HISTORY: \Patient lost IV access were giving Zyvox in the time before she can have a central line to  give her IV penicillin   Principal Problem:   Acute deep vein thrombosis (DVT) of left upper extremity (HCC) Active Problems:   IVDU (intravenous drug user)   Sepsis ([place])   Thrombophlebitis   Hyponatremia   Pneumonia   Deep vein thrombosis (DVT) of left upper extremity, unspecified chronicity, unspecified vein (HCC)    Summer Cain is a 35 y.o. 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  She will then get transesophageal echocardiogram and certainly if she has an aortic root abscess will need CT surgery  #2 HCV: will recheck HCV RNA hep A total  She would benefit from HBV vacciantion and likely also PrEP vs HIV  #3 IVDU: very worried about her having relapsed and chances of this happening after valve replacement but if she has an aortic root abscess she will need surgery  #4 DVT: likely septic thrombophlebitis from IVDU on abx and anticoagulation   LOS: 4 days   Alcide Evener 12/09/2020, 9:50 AM

## 2020-12-09 NOTE — Progress Notes (Signed)
Peripherally Inserted Central Catheter Placement  The IV Nurse has discussed with the patient and/or persons authorized to consent for the patient, the purpose of this procedure and the potential benefits and risks involved with this procedure.  The benefits include less needle sticks, lab draws from the catheter, and the patient may be discharged home with the catheter. Risks include, but not limited to, infection, bleeding, blood clot (thrombus formation), and puncture of an artery; nerve damage and irregular heartbeat and possibility to perform a PICC exchange if needed/ordered by physician.  Alternatives to this procedure were also discussed.  B[employer] Power PICC patient education guide, fact sheet on infection prevention and patient information card has been provided to patient /or left at bedside.    PICC Placement Documentation  PICC Double Lumen 12/09/20 PICC Right Brachial 32 cm 0 cm (Active)  Indication for Insertion or Continuance of Line Limited venous access - need for IV therapy >5 days (PICC only) 12/09/20 1119  Exposed Catheter (cm) 0 cm 12/09/20 1119  Site Assessment Clean;Dry;Intact 12/09/20 1119  Lumen #1 Status Flushed;Saline locked;Blood return noted 12/09/20 1119  Lumen #2 Status Saline locked;Flushed;Blood return noted 12/09/20 1119  Dressing Type Transparent 12/09/20 1119  Antimicrobial disc in place? Yes 12/09/20 1119  Dressing Intervention New dressing;Other (Comment) 12/09/20 1119  Dressing Change Due 12/16/20 12/09/20 1119       Reginia Forts Albarece 12/09/2020, 11:21 AM

## 2020-12-09 NOTE — Progress Notes (Addendum)
Subjective:  Chest pain    Antibiotics:  Anti-infectives (From admission, onward)    Start     Dose/Rate Route Frequency Ordered Stop   12/09/20 1000  linezolid (ZYVOX) tablet 600 mg        600 mg Oral Every 12 hours 12/09/20 0914     12/06/20 0800  cefTRIAXone (ROCEPHIN) 2 g in sodium chloride 0.9 % 100 mL IVPB  Status:  Discontinued        2 g 200 mL/hr over 30 Minutes Intravenous Every 24 hours 12/05/20 0621 12/05/20 0629   12/05/20 2200  penicillin G potassium 12 Million Units in dextrose 5 % 500 mL continuous infusion        12 Million Units 41.7 mL/hr over 12 Hours Intravenous Every 12 hours 12/05/20 1711     12/05/20 2000  vancomycin (VANCOCIN) IVPB 750 mg/150 ml premix  Status:  Discontinued        750 mg 150 mL/hr over 60 Minutes Intravenous Every 12 hours 12/05/20 0635 12/05/20 1242   12/05/20 2000  vancomycin (VANCOCIN) 500 mg in sodium chloride 0.9 % 100 mL IVPB  Status:  Discontinued        500 mg 100 mL/hr over 60 Minutes Intravenous Every 12 hours 12/05/20 1242 12/05/20 1711   12/05/20 1800  clindamycin (CLEOCIN) IVPB 600 mg  Status:  Discontinued        600 mg 100 mL/hr over 30 Minutes Intravenous Every 8 hours 12/05/20 1712 12/09/20 0914   12/05/20 0800  ceFEPIme (MAXIPIME) 2 g in sodium chloride 0.9 % 100 mL IVPB  Status:  Discontinued        2 g 200 mL/hr over 30 Minutes Intravenous Every 8 hours 12/05/20 0630 12/05/20 1711   12/05/20 0100  vancomycin (VANCOCIN) IVPB 1000 mg/200 mL premix        1,000 mg 200 mL/hr over 60 Minutes Intravenous  Once 12/05/20 0051 12/05/20 0734   12/05/20 0100  cefTRIAXone (ROCEPHIN) 2 g in sodium chloride 0.9 % 100 mL IVPB        2 g 200 mL/hr over 30 Minutes Intravenous  Once 12/05/20 0051 12/05/20 0535       Medications: Scheduled Meds:  enoxaparin (LOVENOX) injection  50 mg Subcutaneous Q12H   linezolid  600 mg Oral Q12H   methadone  20 mg Oral Daily   pantoprazole  40 mg Oral Daily   Continuous  Infusions:  penicillin g continuous IV infusion Stopped (12/08/20 1210)   PRN Meds:.acetaminophen **OR** acetaminophen, diphenhydrAMINE, loperamide, ondansetron **OR** ondansetron (ZOFRAN) IV, oxyCODONE    Objective: Weight change: 0 kg  Intake/Output Summary (Last 24 hours) at 12/09/2020 0950 Last data filed at 12/08/2020 1553 Gross per 24 hour  Intake 290 ml  Output 300 ml  Net -10 ml   Blood pressure 105/64, pulse (!) 55, temperature 97.7 F (36.5 C), temperature source Oral, resp. rate 14, height 5' (1.524 m), weight 49.6 kg, SpO2 95 %. Temp:  [97.7 F (36.5 C)-98.6 F (37 C)] 97.7 F (36.5 C) (11/22 0755) Pulse Rate:  [52-74] 55 (11/22 0755) Resp:  [14-20] 14 (11/22 0755) BP: (93-124)/(64-97) 105/64 (11/22 0755) SpO2:  [95 %-99 %] 95 % (11/22 0755) Weight:  [49.6 kg] 49.6 kg (11/21 1013)  Physical Exam: Physical Exam Constitutional:      Appearance: She is ill-appearing.  HENT:     Head: Normocephalic and atraumatic.  Cardiovascular:     Rate and Rhythm: Tachycardia present.  Heart sounds: Murmur heard.  Pulmonary:     Effort: Pulmonary effort is normal. No respiratory distress.     Breath sounds: No wheezing or rales.  Abdominal:     General: Abdomen is flat. Bowel sounds are normal.  Neurological:     General: No focal deficit present.     Mental Status: She is alert and oriented to person, place, and time.  Psychiatric:        Attention and Perception: Attention normal.        Mood and Affect: Mood is depressed.        Speech: Speech normal.        Behavior: Behavior normal. Behavior is cooperative.        Thought Content: Thought content normal.        Cognition and Memory: Cognition and memory normal.        Judgment: Judgment normal.     CBC:    BMET Recent Labs    12/07/20 0126  NA 136  K 4.2  CL 107  CO2 23  GLUCOSE 90  BUN 7  CREATININE 0.59  CALCIUM 7.7*     Liver Panel  Recent Labs    12/07/20 1045  ALBUMIN 1.8*        Sedimentation Rate No results for input(s): ESRSEDRATE in the last 72 hours. C-Reactive Protein No results for input(s): CRP in the last 72 hours.  Micro Results: Recent Results (from the past 720 hour(s))  Resp Panel by RT-PCR (Flu A&B, Covid) Peripheral     Status: None   Collection Time: 12/04/20  8:52 PM   Specimen: Peripheral; Nasopharyngeal(NP) swabs in vial transport medium  Result Value Ref Range Status   SARS Coronavirus 2 by RT PCR NEGATIVE NEGATIVE Final    Comment: (NOTE) SARS-CoV-2 target nucleic acids are NOT DETECTED.  The SARS-CoV-2 RNA is generally detectable in upper respiratory specimens during the acute phase of infection. The lowest concentration of SARS-CoV-2 viral copies this assay can detect is 138 copies/mL. A negative result does not preclude SARS-Cov-2 infection and should not be used as the sole basis for treatment or other patient management decisions. A negative result may occur with  improper specimen collection/handling, submission of specimen other than nasopharyngeal swab, presence of viral mutation(s) within the areas targeted by this assay, and inadequate number of viral copies(<138 copies/mL). A negative result must be combined with clinical observations, patient history, and epidemiological information. The expected result is Negative.  Fact Sheet for Patients:  EntrepreneurPulse.com.au  Fact Sheet for Healthcare Providers:  IncredibleEmployment.be  This test is no t yet approved or cleared by the [place] FDA and  has been authorized for detection and/or diagnosis of SARS-CoV-2 by FDA under an Emergency Use Authorization (EUA). This EUA will remain  in effect (meaning this test can be used) for the duration of the COVID-19 declaration under Section 564(b)(1) of the Act, 21 U.S.C.section 360bbb-3(b)(1), unless the authorization is terminated  or revoked sooner.       Influenza A by  PCR NEGATIVE NEGATIVE Final   Influenza B by PCR NEGATIVE NEGATIVE Final    Comment: (NOTE) The Xpert Xpress SARS-CoV-2/FLU/RSV plus assay is intended as an aid in the diagnosis of influenza from Nasopharyngeal swab specimens and should not be used as a sole basis for treatment. Nasal washings and aspirates are unacceptable for Xpert Xpress SARS-CoV-2/FLU/RSV testing.  Fact Sheet for Patients: EntrepreneurPulse.com.au  Fact Sheet for Healthcare Providers: IncredibleEmployment.be  This test is not yet  approved or cleared by the [place] and has been authorized for detection and/or diagnosis of SARS-CoV-2 by FDA under an Emergency Use Authorization (EUA). This EUA will remain in effect (meaning this test can be used) for the duration of the COVID-19 declaration under Section 564(b)(1) of the Act, 21 U.S.C. section 360bbb-3(b)(1), unless the authorization is terminated or revoked.  Performed at [facility], [address] [place], [place] [address]   Blood Culture (routine x 2)     Status: Abnormal   Collection Time: 12/04/20  9:08 PM   Specimen: BLOOD RIGHT HAND  Result Value Ref Range Status   Specimen Description BLOOD RIGHT HAND  Final   Special Requests   Final    BOTTLES DRAWN AEROBIC AND ANAEROBIC Blood Culture adequate volume   Culture  Setup Time   Final    GRAM POSITIVE COCCI IN CHAINS IN PAIRS AEROBIC BOTTLE ONLY Organism ID to follow CRITICAL RESULT CALLED TO, READ BACK BY AND VERIFIED WITHCandiss Norse [facility] PHARMD [id number] 12/05/20 A BROWNING    Culture (A)  Final    GROUP A STREP (S.PYOGENES) ISOLATED HEALTH DEPARTMENT NOTIFIED Performed at [facility], [address], [place], [place] [address]    Report Status 12/08/2020 FINAL  Final   Organism ID, Bacteria GROUP A STREP (S.PYOGENES) ISOLATED  Final      Susceptibility   Group a strep (s.pyogenes) isolated - MIC*    PENICILLIN <=0.06 SENSITIVE Sensitive      CEFTRIAXONE <=0.12 SENSITIVE Sensitive     ERYTHROMYCIN >=8 RESISTANT Resistant     LEVOFLOXACIN <=0.25 SENSITIVE Sensitive     VANCOMYCIN 0.25 SENSITIVE Sensitive     * GROUP A STREP (S.PYOGENES) ISOLATED  Blood Culture (routine x 2)     Status: Abnormal   Collection Time: 12/04/20  9:08 PM   Specimen: BLOOD RIGHT ARM  Result Value Ref Range Status   Specimen Description BLOOD RIGHT ARM  Final   Special Requests   Final    BOTTLES DRAWN AEROBIC ONLY Blood Culture adequate volume   Culture  Setup Time   Final    GRAM POSITIVE COCCI IN CHAINS AEROBIC BOTTLE ONLY CRITICAL VALUE NOTED.  VALUE IS CONSISTENT WITH PREVIOUSLY REPORTED AND CALLED VALUE.    Culture (A)  Final    GROUP A STREP (S.PYOGENES) ISOLATED SUSCEPTIBILITIES PERFORMED ON PREVIOUS CULTURE WITHIN THE LAST 5 DAYS. HEALTH DEPARTMENT NOTIFIED Performed at [facility], [address] [place], [place], [place] [address]    Report Status 12/08/2020 FINAL  Final  Blood Culture ID Panel (Reflexed)     Status: Abnormal   Collection Time: 12/04/20  9:08 PM  Result Value Ref Range Status   Enterococcus faecalis NOT DETECTED NOT DETECTED Final   Enterococcus Faecium NOT DETECTED NOT DETECTED Final   Listeria monocytogenes NOT DETECTED NOT DETECTED Final   Staphylococcus species NOT DETECTED NOT DETECTED Final   Staphylococcus aureus (BCID) NOT DETECTED NOT DETECTED Final   Staphylococcus epidermidis NOT DETECTED NOT DETECTED Final   Staphylococcus lugdunensis NOT DETECTED NOT DETECTED Final   Streptococcus species DETECTED (A) NOT DETECTED Final    Comment: CRITICAL RESULT CALLED TO, READ BACK BY AND VERIFIED WITHCandiss Norse [facility] PHARMD [id number] 12/05/20 A BROWNING    Streptococcus agalactiae NOT DETECTED NOT DETECTED Final   Streptococcus pneumoniae NOT DETECTED NOT DETECTED Final   Streptococcus pyogenes DETECTED (A) NOT DETECTED Final    Comment: CRITICAL RESULT CALLED TO, READ BACK BY  AND VERIFIED WITHJiles Garter [facility] PHARMD  [id number] 12/05/20 A BROWNING    A.calcoaceticus-baumannii NOT DETECTED NOT DETECTED Final   Bacteroides fragilis NOT DETECTED NOT DETECTED Final   Enterobacterales NOT DETECTED NOT DETECTED Final   Enterobacter cloacae complex NOT DETECTED NOT DETECTED Final   Escherichia coli NOT DETECTED NOT DETECTED Final   Klebsiella aerogenes NOT DETECTED NOT DETECTED Final   Klebsiella oxytoca NOT DETECTED NOT DETECTED Final   Klebsiella pneumoniae NOT DETECTED NOT DETECTED Final   Proteus species NOT DETECTED NOT DETECTED Final   Salmonella species NOT DETECTED NOT DETECTED Final   Serratia marcescens NOT DETECTED NOT DETECTED Final   Haemophilus influenzae NOT DETECTED NOT DETECTED Final   Neisseria meningitidis NOT DETECTED NOT DETECTED Final   Pseudomonas aeruginosa NOT DETECTED NOT DETECTED Final   Stenotrophomonas maltophilia NOT DETECTED NOT DETECTED Final   Candida albicans NOT DETECTED NOT DETECTED Final   Candida auris NOT DETECTED NOT DETECTED Final   Candida glabrata NOT DETECTED NOT DETECTED Final   Candida krusei NOT DETECTED NOT DETECTED Final   Candida parapsilosis NOT DETECTED NOT DETECTED Final   Candida tropicalis NOT DETECTED NOT DETECTED Final   Cryptococcus neoformans/gattii NOT DETECTED NOT DETECTED Final    Comment: Performed at [facility], [place] [address] [place], [place] [address]  Culture, blood (single)     Status: None (Preliminary result)   Collection Time: 12/05/20  1:30 AM   Specimen: BLOOD  Result Value Ref Range Status   Specimen Description BLOOD SITE NOT SPECIFIED  Final   Special Requests   Final    BOTTLES DRAWN AEROBIC AND ANAEROBIC Blood Culture adequate volume   Culture   Final    NO GROWTH 4 DAYS Performed at [facility], [address] [place], [place] [address]    Report Status PENDING  Incomplete  Culture, blood (Routine X 2) w Reflex to ID Panel     Status: None (Preliminary result)   Collection Time: 12/07/20  1:02 AM   Specimen:  BLOOD  Result Value Ref Range Status   Specimen Description BLOOD RIGHT HAND  Final   Special Requests   Final    BOTTLES DRAWN AEROBIC ONLY Blood Culture results may not be optimal due to an inadequate volume of blood received in culture bottles   Culture   Final    NO GROWTH 2 DAYS Performed at [facility], [place] [address] [place], [place] [address]    Report Status PENDING  Incomplete  Culture, blood (Routine X 2) w Reflex to ID Panel     Status: None (Preliminary result)   Collection Time: 12/07/20 10:22 AM   Specimen: BLOOD RIGHT HAND  Result Value Ref Range Status   Specimen Description BLOOD RIGHT HAND  Final   Special Requests   Final    BOTTLES DRAWN AEROBIC ONLY Blood Culture results may not be optimal due to an inadequate volume of blood received in culture bottles   Culture   Final    NO GROWTH 2 DAYS Performed at [facility], [place] [address], [place], [place] [address]    Report Status PENDING  Incomplete    Studies/Results: [place] EKG SITE RITE  Result Date: 12/09/2020 If Site Rite image not attached, placement could not be confirmed due to current cardiac rhythm.     Assessment/Plan:  INTERVAL HISTORY: \Patient lost IV access were giving Zyvox in the time before she can have a central line to  give her IV penicillin   Principal Problem:   Acute deep vein thrombosis (DVT) of left upper extremity (HCC) Active Problems:   IVDU (intravenous drug user)   Sepsis ([place])   Thrombophlebitis   Hyponatremia   Pneumonia   Deep vein thrombosis (DVT) of left upper extremity, unspecified chronicity, unspecified vein (HCC)    Summer Cain is a 35 y.o. 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  She will then get transesophageal echocardiogram and certainly if she has an aortic root abscess will need CT surgery  #2 HCV: will recheck HCV RNA hep A total  She would benefit from HBV vacciantion and likely also PrEP vs HIV  #3 IVDU: very worried about her having relapsed and chances of this happening after valve replacement but if she has an aortic root abscess she will need surgery  #4 DVT: likely septic thrombophlebitis from IVDU on abx and anticoagulation   LOS: 4 days   Alcide Evener 12/09/2020, 9:50 AM

## 2020-12-09 NOTE — Progress Notes (Signed)
HOSPITAL MEDICINE OVERNIGHT EVENT NOTE    Patient lost intravenous access throughout the evening.  IV team visited the patient and attempted multiple times to get IV access but was unsuccessful.  I personally went to the bedside to attempt to place an EJ Angiocath for intravenous access.  Upon my entering the room patient was adamant by not attempt stating that she wanted to sleep and that "it would hurt too much."  Risks of prolonged periods of time without intravenous antibiotics including risk of death were discussed with the patient.  Patient remains adamant that she does not wish to pursue EJ access at this time.  Marinda Elk  MD [facility]

## 2020-12-09 NOTE — Progress Notes (Signed)
The patient has no IV access to administer pain medication or IV antibiotics. Verbalized that PRN oxy is not effective and she needs to either have the dose doubled or have something added. Patient also noted scratching and picking at scabs on skin. Dr. Martyn [name] contacted and updated on patient. New orders to administer scheduled Toradol IM and PRN benadryl. Also discussed potentially placing an EJ so that IV medications can be administered per schedule.

## 2020-12-09 NOTE — Progress Notes (Signed)
      [address]       [place] [address]             [phone]        1 Day Post-Op Procedure(s): INVASIVE LAB ABORTED CASE  Subjective: Patient sleeping again this am and briefly awakened  Objective: Vital signs in last 24 hours: Temp:  [97 F (36.1 C)-98.6 F (37 C)] 97.9 F (36.6 C) (11/22 0347) Pulse Rate:  [52-74] 65 (11/22 0347) Cardiac Rhythm: Sinus bradycardia (11/22 0400) Resp:  [14-20] 14 (11/22 0347) BP: (93-124)/(64-97) 93/67 (11/22 0347) SpO2:  [96 %-99 %] 98 % (11/22 0347) Weight:  [49.6 kg] 49.6 kg (11/21 1013)  Current Weight  12/08/20 49.6 kg      Intake/Output from previous day: 11/21 0701 - 11/22 0700 In: 290 [P.O.:240; I.V.:50] Out: 300 [Urine:300]   Physical Exam:  Cardiovascular: RRR Pulmonary: Clear to auscultation bilaterally Abdomen: Soft, non tender, bowel sounds present. Extremities: No lower extremity edema.   Lab Results: CBC: Recent Labs    12/07/20 0126 12/09/20 0144  WBC 7.8 10.7*  HGB 10.5* 11.4*  HCT 32.9* 35.9*  PLT 188 363    BMET:  Recent Labs    12/07/20 0126  NA 136  K 4.2  CL 107  CO2 23  GLUCOSE 90  BUN 7  CREATININE 0.59  CALCIUM 7.7*     PT/INR:  Lab Results  Component Value Date   INR 1.2 12/04/2020   INR 0.9 04/03/2019   INR 0.9 04/02/2019   ABG:  INR: Will add last result for INR, ABG once components are confirmed Will add last 4 CBG results once components are confirmed  Assessment/Plan:  1. CV - SR with HR in the 70's. TEE was to be done yesterday but lost peripheral IV. Patient to have PICC line and TEE on 11/23 ( to evaluate if aortic root abscess present) 2.  Pulmonary - On room air. Per CT, cavitary lesion in basilar lingula (septic emoboli likely). 3. 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  Anemia - H and H yesterday stable at 10.5 and 32.9 5. ID-on Clindamycin and PCN G for Strep Pyogenes bacteremia, MV endocarditis. Awaiting repeat blood culture  result 6. LUE DVT-on Lovenox [place] bid 7. Poor dentition-awaiting dental evaluation 8. Hepatitis C-may be chronic. Per ID  Aslan Himes M ZimmermanPA-C 12/09/2020,7:22 AM

## 2020-12-09 NOTE — Progress Notes (Signed)
Tele monitoring set up per order. Patient agitated stating "you all wonder why people leave and don't stay here". This Clinical research associate explained to patient that tele monitoring was for her safety.   Dr. Martyn [name] at bedside to place EJ but patient refused stating that she wants to sleep. This Risk and implications of not having IV access explained to the patient et patient states that she wants to sleep and that she knows that she is on the schedule to have a PICC line placed. This writer verbalized to patient early in shift that the PICC line could not be placed until blood cultures resulted and that she needed to have an IV to administer antibiotics. Patient states that she was told that PICC would be placed in the AM so she will wait. No acute distress noted.

## 2020-12-10 ENCOUNTER — Encounter (HOSPITAL_COMMUNITY): Payer: Self-pay | Admitting: Family Medicine

## 2020-12-10 ENCOUNTER — Inpatient Hospital Stay (HOSPITAL_COMMUNITY): Payer: Medicaid Other | Admitting: Anesthesiology

## 2020-12-10 ENCOUNTER — Encounter (HOSPITAL_COMMUNITY)
Admission: EM | Discharge status: Against Medical Advice | Payer: Self-pay | Source: Home / Self Care | Attending: Internal Medicine

## 2020-12-10 ENCOUNTER — Inpatient Hospital Stay (HOSPITAL_COMMUNITY): Payer: Medicaid Other

## 2020-12-10 ENCOUNTER — Other Ambulatory Visit: Payer: Self-pay

## 2020-12-10 DIAGNOSIS — F1910 Other psychoactive substance abuse, uncomplicated: Secondary | ICD-10-CM

## 2020-12-10 DIAGNOSIS — I38 Endocarditis, valve unspecified: Secondary | ICD-10-CM

## 2020-12-10 DIAGNOSIS — I82A12 Acute embolism and thrombosis of left axillary vein: Secondary | ICD-10-CM | POA: Diagnosis not present

## 2020-12-10 DIAGNOSIS — I82622 Acute embolism and thrombosis of deep veins of left upper extremity: Secondary | ICD-10-CM

## 2020-12-10 DIAGNOSIS — F1190 Opioid use, unspecified, uncomplicated: Secondary | ICD-10-CM

## 2020-12-10 DIAGNOSIS — R079 Chest pain, unspecified: Secondary | ICD-10-CM

## 2020-12-10 DIAGNOSIS — F1990 Other psychoactive substance use, unspecified, uncomplicated: Secondary | ICD-10-CM | POA: Diagnosis not present

## 2020-12-10 DIAGNOSIS — F1420 Cocaine dependence, uncomplicated: Secondary | ICD-10-CM

## 2020-12-10 DIAGNOSIS — E871 Hypo-osmolality and hyponatremia: Secondary | ICD-10-CM | POA: Diagnosis not present

## 2020-12-10 HISTORY — PX: TEE WITHOUT CARDIOVERSION: SHX5443

## 2020-12-10 HISTORY — PX: RADIOLOGY WITH ANESTHESIA: SHX6223

## 2020-12-10 LAB — CBC
HCT: 34.8 % — ABNORMAL LOW (ref 36.0–46.0)
Hemoglobin: 10.9 g/dL — ABNORMAL LOW (ref 12.0–15.0)
MCH: 27.3 pg (ref 26.0–34.0)
MCHC: 31.3 g/dL (ref 30.0–36.0)
MCV: 87.2 fL (ref 80.0–100.0)
Platelets: 321 10*3/uL (ref 150–400)
RBC: 3.99 MIL/uL (ref 3.87–5.11)
RDW: 15.1 % (ref 11.5–15.5)
WBC: 7.8 10*3/uL (ref 4.0–10.5)
nRBC: 0 % (ref 0.0–0.2)

## 2020-12-10 LAB — COMPREHENSIVE METABOLIC PANEL
ALT: 26 U/L (ref 0–44)
AST: 35 U/L (ref 15–41)
Albumin: 2 g/dL — ABNORMAL LOW (ref 3.5–5.0)
Alkaline Phosphatase: 64 U/L (ref 38–126)
Anion gap: 7 (ref 5–15)
BUN: <5 mg/dL — ABNORMAL LOW (ref 6–20)
CO2: 28 mmol/L (ref 22–32)
Calcium: 8.5 mg/dL — ABNORMAL LOW (ref 8.9–10.3)
Chloride: 101 mmol/L (ref 98–111)
Creatinine, Ser: 0.66 mg/dL (ref 0.44–1.00)
GFR, Estimated: >60 mL/min (ref >60)
Glucose, Bld: 137 mg/dL — ABNORMAL HIGH (ref 70–99)
Potassium: 4 mmol/L (ref 3.5–5.1)
Sodium: 136 mmol/L (ref 135–145)
Total Bilirubin: 0.3 mg/dL (ref 0.3–1.2)
Total Protein: 6.1 g/dL — ABNORMAL LOW (ref 6.5–8.1)

## 2020-12-10 LAB — PHOSPHORUS: Phosphorus: 3.4 mg/dL (ref 2.5–4.6)

## 2020-12-10 LAB — CULTURE, BLOOD (SINGLE)
Culture: NO GROWTH
Special Requests: ADEQUATE

## 2020-12-10 LAB — MAGNESIUM: Magnesium: 1.8 mg/dL (ref 1.7–2.4)

## 2020-12-10 LAB — SURGICAL PCR SCREEN
MRSA, PCR: POSITIVE — AB
Staphylococcus aureus: POSITIVE — AB

## 2020-12-10 SURGERY — ECHOCARDIOGRAM, TRANSESOPHAGEAL
Anesthesia: Monitor Anesthesia Care

## 2020-12-10 SURGERY — MRI WITH ANESTHESIA
Anesthesia: General

## 2020-12-10 MED ORDER — PROPOFOL 10 MG/ML IV BOLUS
INTRAVENOUS | Status: DC | PRN
  Administered 2020-12-10 (×2): 50 mg via INTRAVENOUS

## 2020-12-10 MED ORDER — ROCURONIUM BROMIDE 10 MG/ML (PF) SYRINGE
PREFILLED_SYRINGE | INTRAVENOUS | Status: DC | PRN
  Administered 2020-12-10: 50 mg via INTRAVENOUS
  Administered 2020-12-10: 20 mg via INTRAVENOUS
  Administered 2020-12-10: 30 mg via INTRAVENOUS

## 2020-12-10 MED ORDER — COVID-19MRNA BIVAL VACC PFIZER 30 MCG/0.3ML IM SUSP
0.3000 mL | Freq: Once | INTRAMUSCULAR | Status: DC
  Filled 2020-12-10: qty 0.3

## 2020-12-10 MED ORDER — LIDOCAINE 2% (20 MG/ML) 5 ML SYRINGE
INTRAMUSCULAR | Status: DC | PRN
  Administered 2020-12-10: 60 mg via INTRAVENOUS

## 2020-12-10 MED ORDER — SUGAMMADEX SODIUM 200 MG/2ML IV SOLN
INTRAVENOUS | Status: DC | PRN
  Administered 2020-12-10: 150 mg via INTRAVENOUS

## 2020-12-10 MED ORDER — FENTANYL CITRATE (PF) 250 MCG/5ML IJ SOLN: INTRAMUSCULAR | Status: DC | PRN

## 2020-12-10 MED ORDER — ONDANSETRON HCL 4 MG/2ML IJ SOLN
INTRAMUSCULAR | Status: DC | PRN
  Administered 2020-12-10: 4 mg via INTRAVENOUS

## 2020-12-10 MED ORDER — GADOBUTROL 1 MMOL/ML IV SOLN
5.0000 mL | Freq: Once | INTRAVENOUS | Status: AC | PRN
  Administered 2020-12-10: 5 mL via INTRAVENOUS

## 2020-12-10 MED ORDER — PROPOFOL 500 MG/50ML IV EMUL
INTRAVENOUS | Status: DC | PRN
  Administered 2020-12-10: 75 ug/kg/min via INTRAVENOUS

## 2020-12-10 MED ORDER — PHENYLEPHRINE 40 MCG/ML (10ML) SYRINGE FOR IV PUSH (FOR BLOOD PRESSURE SUPPORT)
PREFILLED_SYRINGE | INTRAVENOUS | Status: DC | PRN
  Administered 2020-12-10 (×2): 80 ug via INTRAVENOUS
  Administered 2020-12-10: 40 ug via INTRAVENOUS

## 2020-12-10 MED ORDER — MIDAZOLAM HCL 2 MG/2ML IJ SOLN
INTRAMUSCULAR | Status: DC | PRN
  Administered 2020-12-10: 1 mg via INTRAVENOUS
  Administered 2020-12-10: 2 mg via INTRAVENOUS

## 2020-12-10 MED ORDER — SODIUM CHLORIDE 0.9 % IV SOLN: INTRAVENOUS | Status: DC

## 2020-12-10 MED ORDER — CHLORHEXIDINE GLUCONATE 0.12 % MT SOLN
15.0000 mL | Freq: Once | OROMUCOSAL | Status: AC
  Administered 2020-12-10: 15 mL via OROMUCOSAL
  Filled 2020-12-10: qty 15

## 2020-12-10 MED ORDER — MUPIROCIN 2 % EX OINT
1.0000 "application " | TOPICAL_OINTMENT | Freq: Two times a day (BID) | CUTANEOUS | Status: AC
  Administered 2020-12-11 – 2020-12-15 (×10): 1 via NASAL
  Filled 2020-12-10 (×2): qty 22

## 2020-12-10 MED ORDER — LACTATED RINGERS IV SOLN: INTRAVENOUS | Status: DC

## 2020-12-10 MED ORDER — FENTANYL CITRATE (PF) 100 MCG/2ML IJ SOLN
INTRAMUSCULAR | Status: DC | PRN
  Administered 2020-12-10: 50 ug via INTRAVENOUS

## 2020-12-10 MED ORDER — DEXAMETHASONE SODIUM PHOSPHATE 10 MG/ML IJ SOLN
INTRAMUSCULAR | Status: DC | PRN
  Administered 2020-12-10: 10 mg via INTRAVENOUS

## 2020-12-10 MED ORDER — ORAL CARE MOUTH RINSE: 15.0000 mL | Freq: Once | OROMUCOSAL | Status: AC

## 2020-12-10 NOTE — Progress Notes (Signed)
  Echocardiogram Echocardiogram Transesophageal has been performed.  Janalyn Harder 12/10/2020, 11:11 AM

## 2020-12-10 NOTE — Anesthesia Preprocedure Evaluation (Addendum)
Anesthesia Evaluation  Patient identified by MRN, date of birth, ID band Patient awake    Reviewed: Allergy & Precautions, NPO status , Patient's Chart, lab work & pertinent test results  Airway Mallampati: II  TM Distance: >3 FB Neck ROM: Full    Dental  (+) Dental Advisory Given   Pulmonary Current Smoker and Patient abstained from smoking.,    breath sounds clear to auscultation       Cardiovascular + Peripheral Vascular Disease   Rhythm:Regular Rate:Normal     Neuro/Psych negative neurological ROS     GI/Hepatic negative GI ROS, (+) Hepatitis -  Endo/Other  negative endocrine ROS  Renal/GU negative Renal ROS     Musculoskeletal   Abdominal   Peds  Hematology negative hematology ROS (+)   Anesthesia Other Findings   Reproductive/Obstetrics                            Lab Results  Component Value Date   WBC 7.8 12/10/2020   HGB 10.9 (L) 12/10/2020   HCT 34.8 (L) 12/10/2020   MCV 87.2 12/10/2020   PLT 321 12/10/2020   Lab Results  Component Value Date   CREATININE 0.59 12/07/2020   BUN 7 12/07/2020   NA 136 12/07/2020   K 4.2 12/07/2020   CL 107 12/07/2020   CO2 23 12/07/2020    Anesthesia Physical Anesthesia Plan  ASA: 4  Anesthesia Plan: General   Post-op Pain Management:    Induction:   PONV Risk Score and Plan: 2 and Dexamethasone, Ondansetron and Treatment may vary due to age or medical condition  Airway Management Planned: Oral ETT  Additional Equipment:   Intra-op Plan:   Post-operative Plan: Extubation in OR  Informed Consent: I have reviewed the patients History and Physical, chart, labs and discussed the procedure including the risks, benefits and alternatives for the proposed anesthesia with the patient or authorized representative who has indicated his/her understanding and acceptance.     Dental advisory given  Plan Discussed with:  CRNA  Anesthesia Plan Comments:         Anesthesia Quick Evaluation

## 2020-12-10 NOTE — Progress Notes (Signed)
      [address]       [place] [address]             [phone]      Patient sleeping after TEE  Reviewed TEE images and report.   She has small vegetations on the mitral and tricuspid valves. There is trace MR and TR. No definite AV vegetation and NO evidence of aortic root abscess.  No indication for surgery currently. Would treat conservatively with antibiotics  Please call if a surgical issue arises  Salvatore Decent. Dorris Fetch, MD [facility] [phone]

## 2020-12-10 NOTE — Anesthesia Procedure Notes (Signed)
Procedure Name: Intubation Date/Time: 12/10/2020 8:38 AM Performed by: Janace Litten, CRNA Pre-anesthesia Checklist: Patient identified, Emergency Drugs available, Suction available and Patient being monitored Patient Re-evaluated:Patient Re-evaluated prior to induction Oxygen Delivery Method: Circle System Utilized Preoxygenation: Pre-oxygenation with 100% oxygen Induction Type: IV induction Ventilation: Mask ventilation without difficulty Laryngoscope Size: Mac and 3 Grade View: Grade I Tube type: Oral Tube size: 7.0 mm Number of attempts: 1 Airway Equipment and Method: Stylet Placement Confirmation: ETT inserted through vocal cords under direct vision, positive ETCO2 and breath sounds checked- equal and bilateral Secured at: 21 cm Tube secured with: Tape Dental Injury: Teeth and Oropharynx as per pre-operative assessment

## 2020-12-10 NOTE — Procedures (Signed)
     Transesophageal Echocardiogram Note  Summer Cain [id number] 11/20/1985  Procedure: Transesophageal Echocardiogram Indications: Bacteremia, IV drug use  Procedure Details Consent: Obtained Time Out: Verified patient identification, verified procedure, site/side was marked, verified correct patient position, special equipment/implants available, Radiology Safety Procedures followed,  medications/allergies/relevent history reviewed, required imaging and test results available.  Performed  Medications: General anesthesia managed per CV anesthesia team  Left Ventrical:  LVEF 55-60%  Mitral Valve: There is thickening of the mitral valve with small, mobile densities seen on the anterior and posterior mitral leaflet (A1/P1) concerning for vegetations in this clinical context. There is only trivial MR.   Aortic Valve: Tricuspid. No AR. Suspect density seen on transgastric view was due to off-axis view of AoV as is not independently mobile and is the same tissue density of the AoV. Density not seen on alternative views with lower suspicion of vegetation especially given that there is no AR.  Tricuspid Valve: There is thickening with small fibrinous densities seen on the septal leaflet of the tricuspid valve suggestive of vegetation in this clinical context. There is trace TR.   Pulmonic Valve: Normal structure. Possible thickening although images limited. Cannot rule-out small vegetation  Left Atrium/ Left atrial appendage: No LAA thrombus  Atrial septum: No PFO visualized by color doppler  Aorta: No aortic root abscess visualized  Overall, findings concerning for vegetations on the TV and MV but no significant valve destruction. No aortic root abscess visualized.    Complications: No apparent complications Patient did tolerate procedure well.  [name], MD 12/10/2020, 11:35 AM

## 2020-12-10 NOTE — Transfer of Care (Signed)
Immediate Anesthesia Transfer of Care Note  Patient: Geophysical data processor  Procedure(s) Performed: MRI WITH ANESTHESIA- THORACIC WITH AND WITHOUT CONTRAST CERVICAL WITH AND WITHOUT CONTRAST TRANSESOPHAGEAL ECHOCARDIOGRAM (TEE)  Patient Location: PACU  Anesthesia Type:General  Level of Consciousness: oriented, drowsy and patient cooperative  Airway & Oxygen Therapy: Patient Spontanous Breathing and Patient connected to nasal cannula oxygen  Post-op Assessment: Report given to RN and Post -op Vital signs reviewed and stable  Post vital signs: Reviewed  Last Vitals:  Vitals Value Taken Time  BP 118/58 12/10/20 1130  Temp    Pulse 57 12/10/20 1134  Resp 14 12/10/20 1134  SpO2 96 % 12/10/20 1134  Vitals shown include unvalidated device data.  Last Pain:  Vitals:   12/10/20 1115  TempSrc:   PainSc: Asleep      Patients Stated Pain Goal: 1 (12/07/20 2000)  Complications: No notable events documented.

## 2020-12-10 NOTE — Anesthesia Postprocedure Evaluation (Signed)
Anesthesia Post Note  Patient: Geophysical data processor  Procedure(s) Performed: MRI WITH ANESTHESIA- THORACIC WITH AND WITHOUT CONTRAST CERVICAL WITH AND WITHOUT CONTRAST TRANSESOPHAGEAL ECHOCARDIOGRAM (TEE)     Patient location during evaluation: PACU Anesthesia Type: General Level of consciousness: sedated Pain management: pain level controlled Vital Signs Assessment: post-procedure vital signs reviewed and stable Respiratory status: spontaneous breathing and respiratory function stable Cardiovascular status: stable Postop Assessment: no apparent nausea or vomiting Anesthetic complications: no   No notable events documented.  Last Vitals:  Vitals:   12/10/20 1130 12/10/20 1208  BP: (!) 118/58 100/63  Pulse: 65 62  Resp: 12 15  Temp: 36.9 C 36.5 C  SpO2: 98% 100%    Last Pain:  Vitals:   12/10/20 1208  TempSrc: Oral  PainSc:                  Sonnie Pawloski DANIEL

## 2020-12-10 NOTE — Interval H&P Note (Signed)
History and Physical Interval Note:  12/10/2020 8:11 AM  Summer Cain  has presented today for surgery, with the diagnosis of BACTEREMIA, IV DRUG USE.  The various methods of treatment have been discussed with the patient and family. After consideration of risks, benefits and other options for treatment, the patient has consented to  Procedure(s): TRANSESOPHAGEAL ECHOCARDIOGRAM (TEE) (N/A) as a surgical intervention.  The patient's history has been reviewed, patient examined, no change in status, stable for surgery.  I have reviewed the patient's chart and labs.  Questions were answered to the patient's satisfaction.     [name]

## 2020-12-10 NOTE — Progress Notes (Signed)
PROGRESS NOTE    Summer Cain  UEA:[id number] DOB: [birth year] DOA: 12/04/2020 PCP: Patient, No Pcp Per (Inactive)   Brief Narrative:  Summer Cain is an 35 y.o. WF PMHx  IV drug abuse, opiate dependence hepatitis C with mitral valve vegetation on TTE July 2021, her cultures at that time were positive for Pseudomonas, MRSA and Serratia but unfortunately she left AMA comes into the ED for 1 week of left arm pain associated with swelling and redness she also has a lump on her chest wall and swollen lymph nodes.  She also relates scant hemoptysis and shortness of breath left upper extremity CT showed a DVT no abscess CT of the chest showed no PE or pneumonia started empirically on Vanco and Rocephin in the ED blood cultures were obtained showed strep pyogenes surveillance blood cultures negative.  2D echo showed multiple valve endocarditis, TEE was ordered, he is currently on IV penicillin and clindamycin CT of the maxillofacial showed no acute findings.   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   12/08/2020 TEE pending   Subjective: A/O x4, negative CP, negative SOB.  Positive back pain.   Assessment & Plan:  Covid vaccination; vaccinated 2/3.  Requests third vaccination.  11/23 requested bivalent booster  Principal Problem:   Acute deep vein thrombosis (DVT) of left upper extremity (HCC) Active Problems:   Moderate cocaine use disorder (HCC)   IVDU (intravenous drug user)   Sepsis (HCC)   Opioid use disorder   Thrombophlebitis   Hyponatremia   Pneumonia   Deep vein thrombosis (DVT) of left upper extremity, unspecified chronicity, unspecified vein (HCC)   Abscess of aortic root   Endocarditis of mitral valve   Endocarditis of tricuspid valve   Polysubstance abuse (HCC)   Acute deep vein thrombosis (DVT) of left upper extremity (HCC): -Provoked likely due to IV drug abuse was  started on heparin. -CT of the chest showedLong segment venous thrombus extending into the left axillary vein at the junction of the subclavian vein  -Currently on methadone and oxycodone -Currently on Lovenox will need to have LCSW review patient's insurance for lowest cost anticoagulant option     Sepsis likely due to left upper extremity cellulitis: -Blood cultures 12/05/2020 positive strep pyogenes. Surveillance blood cultures from [id number] have been negative, we will go ahead and insert PICC line. -TEE scheduled for 12/10/2020.  Results pending -CT surgery has been notified they recommended a TEE,  -Infectious disease recommended MRI of the cervical and lumbar spine which the patient refused to get, we have her scheduled for 12/09/2020 under anesthesia. -Continue IV penicillin and clindamycin.   Cavitary lung lesion/pulmonary embolism: -Blood cultures were obtained, continue empirically on penicillin and clindamycin.   Hyponatremia probably hypovolemic: -Resolved with IV fluid hydration.  Hepatitis C: -Per patient never received treatment. -Will need follow-up as an outpatient.  IV drug abuse/Polysubstance abuse - July 2021 positive for cocaine - 11/23 UDS pending -Currently on methadone and oxy   DVT prophylaxis: Lovenox treatment dose Code Status: Full Family Communication:  Status is: Inpatient    Dispo: The patient is from: Home              Anticipated d/c is to: SNF              Anticipated d/c date is: > 3 days              Patient currently  is not medically stable to d/c.      Consultants:  ID Dr. Daiva Eves Cardiothoracic surgery Dr. Dorris Fetch Cardiology Dr. Shari Prows   Procedures/Significant Events:  11/23 MRI C-spine/T-spine MRI cervical spine:   1. No evidence of discitis/osteomyelitis or epidural abscess. 2. No significant disc herniation, spinal canal stenosis or neural foraminal narrowing. 3. Straightening of the expected cervical  lordosis. 4. Mild cervical dextrocurvature, possibly positional.   MRI thoracic spine:   1. No evidence of discitis/osteomyelitis or epidural abscess. 2. 5 mm round T2 STIR hyperintense and T1 hypointense lesion within the left aspect of the T6 vertebral body with possible subtle peripheral enhancement. The imaging features are nonspecific and this lesion is indeterminate in etiology. Short-interval, 3-6 month MRI follow-up is recommended to ensure stability. 3. No significant disc herniation, spinal canal stenosis or neural foraminal narrowing. 4. Mild thoracic dextrocurvature, possibly positional. 5. Bilateral pleural effusions (small-to-moderate right, small left).  I have personally reviewed and interpreted all radiology studies and my findings are as above.  VENTILATOR SETTINGS:    Cultures   Antimicrobials: Anti-infectives (From admission, onward)    Start     Ordered Stop   12/09/20 1000  linezolid (ZYVOX) tablet 600 mg  Status:  Discontinued        12/09/20 0914 12/09/20 1443   12/06/20 0800  cefTRIAXone (ROCEPHIN) 2 g in sodium chloride 0.9 % 100 mL IVPB  Status:  Discontinued        12/05/20 0621 12/05/20 0629   12/05/20 2200  penicillin G potassium 12 Million Units in dextrose 5 % 500 mL continuous infusion        12/05/20 1711     12/05/20 2000  vancomycin (VANCOCIN) IVPB 750 mg/150 ml premix  Status:  Discontinued        12/05/20 0635 12/05/20 1242   12/05/20 2000  vancomycin (VANCOCIN) 500 mg in sodium chloride 0.9 % 100 mL IVPB  Status:  Discontinued        12/05/20 1242 12/05/20 1711   12/05/20 1800  clindamycin (CLEOCIN) IVPB 600 mg  Status:  Discontinued        12/05/20 1712 12/09/20 0914   12/05/20 0800  ceFEPIme (MAXIPIME) 2 g in sodium chloride 0.9 % 100 mL IVPB  Status:  Discontinued        12/05/20 0630 12/05/20 1711   12/05/20 0100  vancomycin (VANCOCIN) IVPB 1000 mg/200 mL premix        12/05/20 0051 12/05/20 0734   12/05/20 0100  cefTRIAXone  (ROCEPHIN) 2 g in sodium chloride 0.9 % 100 mL IVPB        12/05/20 0051 12/05/20 0535         Devices    LINES / TUBES:      Continuous Infusions:  penicillin g continuous IV infusion 12 Million Units (12/11/20 0121)     Objective: Vitals:   12/10/20 2311 12/11/20 0043 12/11/20 0243 12/11/20 0607  BP: 118/63  126/68   Pulse: 60  (!) 56   Resp: 17 17 15    Temp: 97.9 F (36.6 C)  98.3 F (36.8 C)   TempSrc: Oral  Oral   SpO2: 99%  100%   Weight:    53.3 kg  Height:        Intake/Output Summary (Last 24 hours) at 12/11/2020 0717 Last data filed at 12/10/2020 1736 Gross per 24 hour  Intake 750 ml  Output 800 ml  Net -50 ml   Filed Weights   12/08/20 [phone]  12/08/20 1013 12/11/20 0607  Weight: 49.6 kg 49.6 kg 53.3 kg    Examination:  General: A/O x4 No acute respiratory distress, cachectic Eyes: negative scleral hemorrhage, negative anisocoria, negative icterus ENT: Negative Runny nose, negative gingival bleeding, Neck:  Negative scars, masses, torticollis, lymphadenopathy, JVD Lungs: Clear to auscultation bilaterally without wheezes or crackles Cardiovascular: Regular rate and rhythm without murmur gallop or rub normal S1 and S2 Abdomen: negative abdominal pain, nondistended, positive soft, bowel sounds, no rebound, no ascites, no appreciable mass Extremities: No significant cyanosis, clubbing, or edema bilateral lower extremities Skin: Negative rashes, lesions, ulcers Psychiatric:  Negative depression, negative anxiety, negative fatigue, negative mania  Central nervous system:  Cranial nerves II through XII intact, tongue/uvula midline, all extremities muscle strength 5/5, sensation intact throughout, negative dysarthria, negative expressive aphasia, negative receptive aphasia.  .     Data Reviewed: Care during the described time interval was provided by me .  I have reviewed this patient's available data, including medical history, events of note,  physical examination, and all test results as part of my evaluation.   CBC: Recent Labs  Lab 12/04/20 2108 12/07/20 0126 12/09/20 0144 12/10/20 0500 12/11/20 0450  WBC 15.9* 7.8 10.7* 7.8 12.0*  NEUTROABS 12.5*  --   --   --  9.2*  HGB 11.0* 10.5* 11.4* 10.9* 10.1*  HCT 32.8* 32.9* 35.9* 34.8* 32.3*  MCV 83.7 85.0 85.5 87.2 87.8  PLT 181 188 363 321 350   Basic Metabolic Panel: Recent Labs  Lab 12/04/20 2108 12/07/20 0126 12/10/20 1250 12/11/20 0450  NA 127* 136 136 138  K 4.0 4.2 4.0 4.5  CL 91* 107 101 103  CO2 GLUCOSE 115* 90 137* 102*  BUN 14 7 <5* 8  CREATININE 0.72 0.59 0.66 0.77  CALCIUM 8.3* 7.7* 8.5* 8.4*  MG  --   --  1.8 1.9  PHOS  --   --  3.4 4.0   GFR: Estimated Creatinine Clearance: 71.8 mL/min (by C-G formula based on SCr of 0.77 mg/dL). Liver Function Tests: Recent Labs  Lab 12/04/20 2108 12/07/20 1045 12/10/20 1250 12/11/20 0450  AST 20  --  35 20  ALT 17  --  26 20  ALKPHOS 44  --  64 61  BILITOT 0.7  --  0.3 0.4  PROT 6.7  --  6.1* 5.4*  ALBUMIN 2.6* 1.8* 2.0* 1.7*   No results for input(s): LIPASE, AMYLASE in the last 168 hours. No results for input(s): AMMONIA in the last 168 hours. Coagulation Profile: Recent Labs  Lab 12/04/20 2108  INR 1.2   Cardiac Enzymes: No results for input(s): CKTOTAL, CKMB, CKMBINDEX, TROPONINI in the last 168 hours. BNP (last 3 results) No results for input(s): PROBNP in the last 8760 hours. HbA1C: No results for input(s): HGBA1C in the last 72 hours. CBG: No results for input(s): GLUCAP in the last 168 hours. Lipid Profile: No results for input(s): CHOL, HDL, LDLCALC, TRIG, CHOLHDL, LDLDIRECT in the last 72 hours. Thyroid Function Tests: No results for input(s): TSH, T4TOTAL, FREET4, T3FREE, THYROIDAB in the last 72 hours. Anemia Panel: No results for input(s): VITAMINB12, FOLATE, FERRITIN, TIBC, IRON, RETICCTPCT in the last 72 hours. Urine analysis:    Component Value Date/Time    COLORURINE AMBER (A) 12/05/2020 0029   APPEARANCEUR HAZY (A) 12/05/2020 0029   LABSPEC 1.028 12/05/2020 0029   PHURINE 6.0 12/05/2020 0029   GLUCOSEU NEGATIVE 12/05/2020 0029   HGBUR NEGATIVE 12/05/2020 0029  BILIRUBINUR NEGATIVE 12/05/2020 0029   KETONESUR NEGATIVE 12/05/2020 0029   PROTEINUR 30 (A) 12/05/2020 0029   NITRITE NEGATIVE 12/05/2020 0029   LEUKOCYTESUR NEGATIVE 12/05/2020 0029   Sepsis Labs: @LABRCNTIP (procalcitonin:4,lacticidven:4)  ) Recent Results (from the past 240 hour(s))  Resp Panel by RT-PCR (Flu A&B, Covid) Peripheral     Status: None   Collection Time: 12/04/20  8:52 PM   Specimen: Peripheral; Nasopharyngeal(NP) swabs in vial transport medium  Result Value Ref Range Status   SARS Coronavirus 2 by RT PCR NEGATIVE NEGATIVE Final    Comment: (NOTE) SARS-CoV-2 target nucleic acids are NOT DETECTED.  The SARS-CoV-2 RNA is generally detectable in upper respiratory specimens during the acute phase of infection. The lowest concentration of SARS-CoV-2 viral copies this assay can detect is 138 copies/mL. A negative result does not preclude SARS-Cov-2 infection and should not be used as the sole basis for treatment or other patient management decisions. A negative result may occur with  improper specimen collection/handling, submission of specimen other than nasopharyngeal swab, presence of viral mutation(s) within the areas targeted by this assay, and inadequate number of viral copies(<138 copies/mL). A negative result must be combined with clinical observations, patient history, and epidemiological information. The expected result is Negative.  Fact Sheet for Patients:  BloggerCourse.com  Fact Sheet for Healthcare Providers:  SeriousBroker.it  This test is no t yet approved or cleared by the [place] FDA and  has been authorized for detection and/or diagnosis of SARS-CoV-2 by FDA under an Emergency  Use Authorization (EUA). This EUA will remain  in effect (meaning this test can be used) for the duration of the COVID-19 declaration under Section 564(b)(1) of the Act, 21 U.S.C.section 360bbb-3(b)(1), unless the authorization is terminated  or revoked sooner.       Influenza A by PCR NEGATIVE NEGATIVE Final   Influenza B by PCR NEGATIVE NEGATIVE Final    Comment: (NOTE) The Xpert Xpress SARS-CoV-2/FLU/RSV plus assay is intended as an aid in the diagnosis of influenza from Nasopharyngeal swab specimens and should not be used as a sole basis for treatment. Nasal washings and aspirates are unacceptable for Xpert Xpress SARS-CoV-2/FLU/RSV testing.  Fact Sheet for Patients: BloggerCourse.com  Fact Sheet for Healthcare Providers: SeriousBroker.it  This test is not yet approved or cleared by the [place] FDA and has been authorized for detection and/or diagnosis of SARS-CoV-2 by FDA under an Emergency Use Authorization (EUA). This EUA will remain in effect (meaning this test can be used) for the duration of the COVID-19 declaration under Section 564(b)(1) of the Act, 21 U.S.C. section 360bbb-3(b)(1), unless the authorization is terminated or revoked.  Performed at [facility], [address] [place], [place] [address]   Blood Culture (routine x 2)     Status: Abnormal   Collection Time: 12/04/20  9:08 PM   Specimen: BLOOD RIGHT HAND  Result Value Ref Range Status   Specimen Description BLOOD RIGHT HAND  Final   Special Requests   Final    BOTTLES DRAWN AEROBIC AND ANAEROBIC Blood Culture adequate volume   Culture  Setup Time   Final    GRAM POSITIVE COCCI IN CHAINS IN PAIRS AEROBIC BOTTLE ONLY Organism ID to follow CRITICAL RESULT CALLED TO, READ BACK BY AND VERIFIED WITHCandiss Norse [facility] PHARMD [id number] 12/05/20 A BROWNING    Culture (A)  Final    GROUP A STREP (S.PYOGENES) ISOLATED HEALTH DEPARTMENT  NOTIFIED Performed at [facility], [address]  [place], [place] [address]    Report Status 12/08/2020 FINAL  Final   Organism ID, Bacteria GROUP A STREP (S.PYOGENES) ISOLATED  Final      Susceptibility   Group a strep (s.pyogenes) isolated - MIC*    PENICILLIN <=0.06 SENSITIVE Sensitive     CEFTRIAXONE <=0.12 SENSITIVE Sensitive     ERYTHROMYCIN >=8 RESISTANT Resistant     LEVOFLOXACIN <=0.25 SENSITIVE Sensitive     VANCOMYCIN 0.25 SENSITIVE Sensitive     * GROUP A STREP (S.PYOGENES) ISOLATED  Blood Culture (routine x 2)     Status: Abnormal   Collection Time: 12/04/20  9:08 PM   Specimen: BLOOD RIGHT ARM  Result Value Ref Range Status   Specimen Description BLOOD RIGHT ARM  Final   Special Requests   Final    BOTTLES DRAWN AEROBIC ONLY Blood Culture adequate volume   Culture  Setup Time   Final    GRAM POSITIVE COCCI IN CHAINS AEROBIC BOTTLE ONLY CRITICAL VALUE NOTED.  VALUE IS CONSISTENT WITH PREVIOUSLY REPORTED AND CALLED VALUE.    Culture (A)  Final    GROUP A STREP (S.PYOGENES) ISOLATED SUSCEPTIBILITIES PERFORMED ON PREVIOUS CULTURE WITHIN THE LAST 5 DAYS. HEALTH DEPARTMENT NOTIFIED Performed at [facility], [address] [place], [place] [address]    Report Status 12/08/2020 FINAL  Final  Blood Culture ID Panel (Reflexed)     Status: Abnormal   Collection Time: 12/04/20  9:08 PM  Result Value Ref Range Status   Enterococcus faecalis NOT DETECTED NOT DETECTED Final   Enterococcus Faecium NOT DETECTED NOT DETECTED Final   Listeria monocytogenes NOT DETECTED NOT DETECTED Final   Staphylococcus species NOT DETECTED NOT DETECTED Final   Staphylococcus aureus (BCID) NOT DETECTED NOT DETECTED Final   Staphylococcus epidermidis NOT DETECTED NOT DETECTED Final   Staphylococcus lugdunensis NOT DETECTED NOT DETECTED Final   Streptococcus species DETECTED (A) NOT DETECTED Final    Comment: CRITICAL RESULT CALLED TO, READ BACK BY AND VERIFIED WITHCandiss Norse  [facility] PHARMD [id number] 12/05/20 A BROWNING    Streptococcus agalactiae NOT DETECTED NOT DETECTED Final   Streptococcus pneumoniae NOT DETECTED NOT DETECTED Final   Streptococcus pyogenes DETECTED (A) NOT DETECTED Final    Comment: CRITICAL RESULT CALLED TO, READ BACK BY AND VERIFIED WITHCandiss Norse [facility] PHARMD [id number] 12/05/20 A BROWNING    A.calcoaceticus-baumannii NOT DETECTED NOT DETECTED Final   Bacteroides fragilis NOT DETECTED NOT DETECTED Final   Enterobacterales NOT DETECTED NOT DETECTED Final   Enterobacter cloacae complex NOT DETECTED NOT DETECTED Final   Escherichia coli NOT DETECTED NOT DETECTED Final   Klebsiella aerogenes NOT DETECTED NOT DETECTED Final   Klebsiella oxytoca NOT DETECTED NOT DETECTED Final   Klebsiella pneumoniae NOT DETECTED NOT DETECTED Final   Proteus species NOT DETECTED NOT DETECTED Final   Salmonella species NOT DETECTED NOT DETECTED Final   Serratia marcescens NOT DETECTED NOT DETECTED Final   Haemophilus influenzae NOT DETECTED NOT DETECTED Final   Neisseria meningitidis NOT DETECTED NOT DETECTED Final   Pseudomonas aeruginosa NOT DETECTED NOT DETECTED Final   Stenotrophomonas maltophilia NOT DETECTED NOT DETECTED Final   Candida albicans NOT DETECTED NOT DETECTED Final   Candida auris NOT DETECTED NOT DETECTED Final   Candida glabrata NOT DETECTED NOT DETECTED Final   Candida krusei NOT DETECTED NOT DETECTED Final   Candida parapsilosis NOT DETECTED NOT DETECTED Final   Candida tropicalis NOT DETECTED NOT DETECTED Final   Cryptococcus neoformans/gattii NOT DETECTED NOT DETECTED Final  Comment: Performed at [facility], [address], [place], [place] [address]  Culture, blood (single)     Status: None   Collection Time: 12/05/20  1:30 AM   Specimen: BLOOD  Result Value Ref Range Status   Specimen Description BLOOD SITE NOT SPECIFIED  Final   Special Requests   Final    BOTTLES DRAWN AEROBIC AND ANAEROBIC Blood Culture adequate volume    Culture   Final    NO GROWTH 5 DAYS Performed at [facility], [address], [place], [place] [address]    Report Status 12/10/2020 FINAL  Final  Culture, blood (Routine X 2) w Reflex to ID Panel     Status: None (Preliminary result)   Collection Time: 12/07/20  1:02 AM   Specimen: BLOOD  Result Value Ref Range Status   Specimen Description BLOOD RIGHT HAND  Final   Special Requests   Final    BOTTLES DRAWN AEROBIC ONLY Blood Culture results may not be optimal due to an inadequate volume of blood received in culture bottles   Culture   Final    NO GROWTH 3 DAYS Performed at [facility], [address] [place], [place] [address]    Report Status PENDING  Incomplete  Culture, blood (Routine X 2) w Reflex to ID Panel     Status: None (Preliminary result)   Collection Time: 12/07/20 10:22 AM   Specimen: BLOOD RIGHT HAND  Result Value Ref Range Status   Specimen Description BLOOD RIGHT HAND  Final   Special Requests   Final    BOTTLES DRAWN AEROBIC ONLY Blood Culture results may not be optimal due to an inadequate volume of blood received in culture bottles   Culture   Final    NO GROWTH 3 DAYS Performed at [facility], [address] [place], [place] [address]    Report Status PENDING  Incomplete  Surgical pcr screen     Status: Abnormal   Collection Time: 12/10/20  1:00 PM   Specimen: Nasal Mucosa; Nasal Swab  Result Value Ref Range Status   MRSA, PCR POSITIVE (A) NEGATIVE Final    Comment: RESULT CALLED TO, READ BACK BY AND VERIFIED WITH: Louie Boston RN [id number] 12/10/20 A BROWNING    Staphylococcus aureus POSITIVE (A) NEGATIVE Final    Comment: (NOTE) The Xpert SA Assay (FDA approved for NASAL specimens in patients 63 years of age and older), is one component of a comprehensive surveillance program. It is not intended to diagnose infection nor to guide or monitor treatment. Performed at [facility], [address], [place], [place] [address]           Radiology Studies: MR CERVICAL SPINE W WO CONTRAST  Result Date: 12/10/2020 CLINICAL DATA:  Back pain. 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 EXAM: MRI CERVICAL AND THORACIC SPINE WITHOUT AND WITH CONTRAST TECHNIQUE: Multiplanar and multiecho pulse sequences of the cervical spine, to include the craniocervical junction and cervicothoracic junction, and the thoracic spine, were obtained without and with intravenous contrast. CONTRAST:  47mL GADAVIST GADOBUTROL 1 MMOL/ML IV SOLN COMPARISON:  CT angiogram chest 12/05/2020. FINDINGS: MRI CERVICAL SPINE FINDINGS Alignment: Straightening of the expected cervical lordosis. Mild cervical dextrocurvature, possibly positional. No significant spondylolisthesis. Vertebrae: Vertebral body height is maintained. No significant marrow edema or focal suspicious osseous lesion. Cord: No  signal abnormality identified within the cervical spinal cord. No abnormal enhancement within the spinal canal. Posterior Fossa, vertebral arteries, paraspinal tissues: No abnormality identified within included portions of the posterior fossa. Flow voids preserved within the imaged cervical vertebral arteries. Paraspinal soft tissues unremarkable. Disc levels: No significant disc herniation, spinal canal stenosis or neural foraminal narrowing. MRI THORACIC SPINE FINDINGS Alignment:  Mild thoracic dextrocurvature.  No spondylolisthesis. Vertebrae: Vertebral body height is maintained. Nonspecific 5 mm round T2 STIR hyperintense and T1 hypointense lesion within the left aspect of the T6 vertebral body with possible subtle peripheral enhancement (for instance as seen on series 12, image 9) (series 15, image 19) (series 19, image 9). There are no findings to suggest discitis/osteomyelitis. Cord: No signal abnormality identified within the  thoracic spinal cord. No abnormal enhancement within the thoracic spinal canal. Paraspinal and other soft tissues: Bilateral pleural effusions (small to moderate right, small left). No abnormality identified within included portions of the upper abdomen/retroperitoneum. Paraspinal soft tissues unremarkable. Disc levels: Intervertebral disc height and hydration are maintained throughout the thoracic spine. No significant disc herniation, spinal canal stenosis or neural foraminal narrowing. IMPRESSION: MRI cervical spine: 1. No evidence of discitis/osteomyelitis or epidural abscess. 2. No significant disc herniation, spinal canal stenosis or neural foraminal narrowing. 3. Straightening of the expected cervical lordosis. 4. Mild cervical dextrocurvature, possibly positional. MRI thoracic spine: 1. No evidence of discitis/osteomyelitis or epidural abscess. 2. 5 mm round T2 STIR hyperintense and T1 hypointense lesion within the left aspect of the T6 vertebral body with possible subtle peripheral enhancement. The imaging features are nonspecific and this lesion is indeterminate in etiology. Short-interval, 3-6 month MRI follow-up is recommended to ensure stability. 3. No significant disc herniation, spinal canal stenosis or neural foraminal narrowing. 4. Mild thoracic dextrocurvature, possibly positional. 5. Bilateral pleural effusions (small-to-moderate right, small left). Electronically Signed   By: Jackey Loge D.O.   On: 12/10/2020 10:28   MR THORACIC SPINE W WO CONTRAST  Result Date: 12/10/2020 CLINICAL DATA:  Back pain. 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 EXAM: MRI CERVICAL AND THORACIC SPINE WITHOUT AND WITH CONTRAST TECHNIQUE: Multiplanar and multiecho pulse sequences of the cervical spine, to include the craniocervical junction and cervicothoracic junction,  and the thoracic spine, were obtained without and with intravenous contrast. CONTRAST:  48mL GADAVIST GADOBUTROL 1 MMOL/ML IV SOLN COMPARISON:  CT angiogram chest 12/05/2020. FINDINGS: MRI CERVICAL SPINE FINDINGS Alignment: Straightening of the expected cervical lordosis. Mild cervical dextrocurvature, possibly positional. No significant spondylolisthesis. Vertebrae: Vertebral body height is maintained. No significant marrow edema or focal suspicious osseous lesion. Cord: No signal abnormality identified within the cervical spinal cord. No abnormal enhancement within the spinal canal. Posterior Fossa, vertebral arteries, paraspinal tissues: No abnormality identified within included portions of the posterior fossa. Flow voids preserved within the imaged cervical vertebral arteries. Paraspinal soft tissues unremarkable. Disc levels: No significant disc herniation, spinal canal stenosis or neural foraminal narrowing. MRI THORACIC SPINE FINDINGS Alignment:  Mild thoracic dextrocurvature.  No spondylolisthesis. Vertebrae: Vertebral body height is maintained. Nonspecific 5 mm round T2 STIR hyperintense and T1 hypointense lesion within the left aspect of the T6 vertebral body with possible subtle peripheral enhancement (for instance as seen on series 12, image 9) (series 15, image 19) (series 19, image 9). There are no findings to suggest discitis/osteomyelitis. Cord: No signal abnormality identified within the thoracic spinal cord. No  abnormal enhancement within the thoracic spinal canal. Paraspinal and other soft tissues: Bilateral pleural effusions (small to moderate right, small left). No abnormality identified within included portions of the upper abdomen/retroperitoneum. Paraspinal soft tissues unremarkable. Disc levels: Intervertebral disc height and hydration are maintained throughout the thoracic spine. No significant disc herniation, spinal canal stenosis or neural foraminal narrowing. IMPRESSION: MRI cervical  spine: 1. No evidence of discitis/osteomyelitis or epidural abscess. 2. No significant disc herniation, spinal canal stenosis or neural foraminal narrowing. 3. Straightening of the expected cervical lordosis. 4. Mild cervical dextrocurvature, possibly positional. MRI thoracic spine: 1. No evidence of discitis/osteomyelitis or epidural abscess. 2. 5 mm round T2 STIR hyperintense and T1 hypointense lesion within the left aspect of the T6 vertebral body with possible subtle peripheral enhancement. The imaging features are nonspecific and this lesion is indeterminate in etiology. Short-interval, 3-6 month MRI follow-up is recommended to ensure stability. 3. No significant disc herniation, spinal canal stenosis or neural foraminal narrowing. 4. Mild thoracic dextrocurvature, possibly positional. 5. Bilateral pleural effusions (small-to-moderate right, small left). Electronically Signed   By: Jackey Loge D.O.   On: 12/10/2020 10:28   ECHO TEE  Result Date: 12/10/2020    TRANSESOPHOGEAL ECHO REPORT   Patient Name:   Summer Cain Date of Exam: 12/10/2020 Medical Rec #:  [id number]       Height:       60.0 in Accession #:    [id number]      Weight:       109.3 lb Date of Birth:  1985-09-05       BSA:          1.444 m Patient Age:    35 years        BP:           117/60 mmHg Patient Gender: F               HR:           103 bpm. Exam Location:  Inpatient Procedure: 3D Echo, Transesophageal Echo, Color Doppler and Cardiac Doppler Indications:    Endocarditis  History:        Patient has prior history of Echocardiogram examinations, most                 recent 12/05/2020. Endocarditis; Signs/Symptoms:Chest Pain.                 IVDU. ETOH. Hepatitis.  Sonographer:    Sheralyn Boatman RDCS Referring Phys: [id number] KIOSHA BUCHAN [place] PROCEDURE: After discussion of the risks and benefits of a TEE, an informed consent was obtained from the patient. The patient was intubated. The transesophogeal probe was passed without difficulty  through the esophogus of the patient. Imaged were obtained with the patient in a supine position. Sedation performed by different physician. The patient was monitored while under deep sedation. Anesthestetic sedation was provided intravenously by Anesthesiology:  of Propofol. The patient's vital signs; including heart rate, blood pressure, and oxygen saturation; remained stable throughout the procedure. The patient developed no complications during the procedure. IMPRESSIONS  1. Left ventricular ejection fraction, by estimation, is 60 to 65%. The left ventricle has normal function.  2. Right ventricular systolic function is normal. The right ventricular size is normal.  3. No left atrial/left atrial appendage thrombus was detected.  4. There are small mobile denisities visualized on the anterior and posterior mitral valve leaflets (along A1/P1). Given clinical context of bacteremia and IV drug use, findings concerning for underlying  vegetations. Trivial mitral regurgitation.  5. There is thickening of the tricuspid valve septal leaflet with small fibrinous stranding visualized. Findings concerning for vegetations given clinical context as detailed above. There is trace tricuspid regurgitation.  6. The aortic valve is tricuspid. Aortic valve regurgitation is not visualized. Suspect density seen on transgastric view was due to off-axis view of aortic valve as is not independently mobile and is the same tissue density of the aortic valve. Density  not seen on alternative views with lower suspicion of vegetation especially given that there is no aortic regurgitation.  7. There is a mobile density visualized on the pulmonary valve. While this may be related to artifact, cannot rule out vegetation. There is trivial pulmonary regurgitation.  8. Normal aortic root without evidence of root abscess.  9. Overall, there are vegetations visualized on the mitral and tricuspid valves without evidence of significant valve  destruction. There is no aortic root abscess. FINDINGS  Left Ventricle: Left ventricular ejection fraction, by estimation, is 60 to 65%. The left ventricle has normal function. The left ventricular internal cavity size was normal in size. Right Ventricle: The right ventricular size is normal. No increase in right ventricular wall thickness. Right ventricular systolic function is normal. Left Atrium: Left atrial size was normal in size. No left atrial/left atrial appendage thrombus was detected. Right Atrium: Right atrial size was normal in size. Pericardium: There is no evidence of pericardial effusion. Mitral Valve: There are small mobile denisities visualized on the anterior and posterior mitral valve leaflets (along A1/P1). Given clinical context of bacteremia and IV drug use, findings concerning for underlying vegetations. Trivial mitral regurgitation. Tricuspid Valve: There is thickening of the tricuspid valve septal leaflet with small fibrinous stranding visualized. Findings concerning for vegetations given clinical context as detailed above. There is trace tricuspid regurgitation.  Aortic Valve: \ The aortic valve is tricuspid. Aortic valve regurgitation is not visualized. Suspect density seen on transgastric view was due to off-axis view of AoV as is not independently mobile and is the same tissue density of the AoV. Density not seen on alternative views with lower suspicion of vegetation especially given that there is no AR.  Aorta: Normal aortic root without evidence of abscess. Pulmonary Artery: There is a mobile density visualized on the pulmonary valve. While this may be related to artifact, cannot rule out vegetation. There is trivial pulmonary regurgitation. IAS/Shunts: No atrial level shunt detected by color flow Doppler. Laurance Flatten MD Electronically signed by Laurance Flatten MD Signature Date/Time: 12/10/2020/5:15:29 PM    Final    [place] EKG SITE RITE  Result Date: 12/09/2020 If Site Rite  image not attached, placement could not be confirmed due to current cardiac rhythm.       Scheduled Meds:  Chlorhexidine Gluconate Cloth  6 each Topical Daily   COVID-19 mRNA bivalent vaccine (Pfizer)  0.3 mL Intramuscular Once   enoxaparin (LOVENOX) injection  50 mg Subcutaneous Q12H   methadone  20 mg Oral Daily   mupirocin ointment  1 application Nasal BID   pantoprazole  40 mg Oral BID   sodium chloride flush  10-40 mL Intracatheter Q12H   Continuous Infusions:  penicillin g continuous IV infusion 12 Million Units (12/11/20 0121)     LOS: 6 days   The patient is critically ill with multiple organ systems failure and requires high complexity decision making for assessment and support, frequent evaluation and titration of therapies, application of advanced monitoring technologies and extensive interpretation of multiple databases. Critical  Care Time devoted to patient care services described in this note  Time spent: 40 minutes     Dempsey Ahonen, Roselind Messier, MD [facility]   If 7PM-7AM, please contact night-coverage 12/11/2020, 7:17 AM

## 2020-12-10 NOTE — Progress Notes (Addendum)
Subjective:  No new complaints  Antibiotics:  Anti-infectives (From admission, onward)    Start     Dose/Rate Route Frequency Ordered Stop   12/09/20 1000  linezolid (ZYVOX) tablet 600 mg  Status:  Discontinued        600 mg Oral Every 12 hours 12/09/20 0914 12/09/20 1443   12/06/20 0800  cefTRIAXone (ROCEPHIN) 2 g in sodium chloride 0.9 % 100 mL IVPB  Status:  Discontinued        2 g 200 mL/hr over 30 Minutes Intravenous Every 24 hours 12/05/20 0621 12/05/20 0629   12/05/20 2200  penicillin G potassium 12 Million Units in dextrose 5 % 500 mL continuous infusion        12 Million Units 41.7 mL/hr over 12 Hours Intravenous Every 12 hours 12/05/20 1711     12/05/20 2000  vancomycin (VANCOCIN) IVPB 750 mg/150 ml premix  Status:  Discontinued        750 mg 150 mL/hr over 60 Minutes Intravenous Every 12 hours 12/05/20 0635 12/05/20 1242   12/05/20 2000  vancomycin (VANCOCIN) 500 mg in sodium chloride 0.9 % 100 mL IVPB  Status:  Discontinued        500 mg 100 mL/hr over 60 Minutes Intravenous Every 12 hours 12/05/20 1242 12/05/20 1711   12/05/20 1800  clindamycin (CLEOCIN) IVPB 600 mg  Status:  Discontinued        600 mg 100 mL/hr over 30 Minutes Intravenous Every 8 hours 12/05/20 1712 12/09/20 0914   12/05/20 0800  ceFEPIme (MAXIPIME) 2 g in sodium chloride 0.9 % 100 mL IVPB  Status:  Discontinued        2 g 200 mL/hr over 30 Minutes Intravenous Every 8 hours 12/05/20 0630 12/05/20 1711   12/05/20 0100  vancomycin (VANCOCIN) IVPB 1000 mg/200 mL premix        1,000 mg 200 mL/hr over 60 Minutes Intravenous  Once 12/05/20 0051 12/05/20 0734   12/05/20 0100  cefTRIAXone (ROCEPHIN) 2 g in sodium chloride 0.9 % 100 mL IVPB        2 g 200 mL/hr over 30 Minutes Intravenous  Once 12/05/20 0051 12/05/20 0535       Medications: Scheduled Meds:  Chlorhexidine Gluconate Cloth  6 each Topical Daily   COVID-19 mRNA bivalent vaccine (Pfizer)  0.3 mL Intramuscular Once   enoxaparin  (LOVENOX) injection  50 mg Subcutaneous Q12H   methadone  20 mg Oral Daily   pantoprazole  40 mg Oral BID   sodium chloride flush  10-40 mL Intracatheter Q12H   Continuous Infusions:  penicillin g continuous IV infusion 12 Million Units (12/10/20 1237)   PRN Meds:.acetaminophen **OR** acetaminophen, diphenhydrAMINE, ibuprofen, loperamide, ondansetron **OR** ondansetron (ZOFRAN) IV, oxyCODONE, sodium chloride flush    Objective: Weight change:   Intake/Output Summary (Last 24 hours) at 12/10/2020 1433 Last data filed at 12/10/2020 1052 Gross per 24 hour  Intake 720 ml  Output 0 ml  Net 720 ml    Blood pressure 100/63, pulse 62, temperature 97.7 F (36.5 C), temperature source Oral, resp. rate 15, height 5' (1.524 m), weight 49.6 kg, SpO2 100 %. Temp:  [97.7 F (36.5 C)-98.6 F (37 C)] 97.7 F (36.5 C) (11/23 1208) Pulse Rate:  [51-73] 62 (11/23 1208) Resp:  [12-17] 15 (11/23 1208) BP: (100-199)/(43-63) 100/63 (11/23 1208) SpO2:  [98 %-100 %] 100 % (11/23 1208)  Physical Exam: Physical Exam Constitutional:      General: She  is not in acute distress.    Appearance: She is well-developed. She is not diaphoretic.  HENT:     Head: Normocephalic and atraumatic.     Right Ear: External ear normal.     Left Ear: External ear normal.     Mouth/Throat:     Pharynx: No oropharyngeal exudate.  Eyes:     General: No scleral icterus.    Conjunctiva/sclera: Conjunctivae normal.     Pupils: Pupils are equal, round, and reactive to light.  Cardiovascular:     Rate and Rhythm: Normal rate and regular rhythm.     Heart sounds:    No friction rub. No gallop.  Pulmonary:     Effort: Pulmonary effort is normal. No respiratory distress.     Breath sounds: No wheezing.  Abdominal:     General: Bowel sounds are normal. There is no distension.     Palpations: Abdomen is soft.     Tenderness: There is no abdominal tenderness. There is no rebound.  Musculoskeletal:        General:  Normal range of motion.  Lymphadenopathy:     Cervical: No cervical adenopathy.  Skin:    General: Skin is warm and dry.     Coloration: Skin is pale.     Findings: No erythema or rash.  Neurological:     General: No focal deficit present.     Mental Status: She is alert and oriented to person, place, and time.     Motor: No abnormal muscle tone.  Psychiatric:        Mood and Affect: Mood normal.        Behavior: Behavior normal.        Thought Content: Thought content normal.        Judgment: Judgment normal.     CBC:    BMET Recent Labs    12/10/20 1250  NA 136  K 4.0  CL 101  CO2 28  GLUCOSE 137*  BUN <5*  CREATININE 0.66  CALCIUM 8.5*      Liver Panel  Recent Labs    12/10/20 1250  PROT 6.1*  ALBUMIN 2.0*  AST 35  ALT 26  ALKPHOS 64  BILITOT 0.3        Sedimentation Rate No results for input(s): ESRSEDRATE in the last 72 hours. C-Reactive Protein No results for input(s): CRP in the last 72 hours.  Micro Results: Recent Results (from the past 720 hour(s))  Resp Panel by RT-PCR (Flu A&B, Covid) Peripheral     Status: None   Collection Time: 12/04/20  8:52 PM   Specimen: Peripheral; Nasopharyngeal(NP) swabs in vial transport medium  Result Value Ref Range Status   SARS Coronavirus 2 by RT PCR NEGATIVE NEGATIVE Final    Comment: (NOTE) SARS-CoV-2 target nucleic acids are NOT DETECTED.  The SARS-CoV-2 RNA is generally detectable in upper respiratory specimens during the acute phase of infection. The lowest concentration of SARS-CoV-2 viral copies this assay can detect is 138 copies/mL. A negative result does not preclude SARS-Cov-2 infection and should not be used as the sole basis for treatment or other patient management decisions. A negative result may occur with  improper specimen collection/handling, submission of specimen other than nasopharyngeal swab, presence of viral mutation(s) within the areas targeted by this assay, and  inadequate number of viral copies(<138 copies/mL). A negative result must be combined with clinical observations, patient history, and epidemiological information. The expected result is Negative.  Fact Sheet for Patients:  EntrepreneurPulse.com.au  Fact Sheet for Healthcare Providers:  IncredibleEmployment.be  This test is no t yet approved or cleared by the [place] FDA and  has been authorized for detection and/or diagnosis of SARS-CoV-2 by FDA under an Emergency Use Authorization (EUA). This EUA will remain  in effect (meaning this test can be used) for the duration of the COVID-19 declaration under Section 564(b)(1) of the Act, 21 U.S.C.section 360bbb-3(b)(1), unless the authorization is terminated  or revoked sooner.       Influenza A by PCR NEGATIVE NEGATIVE Final   Influenza B by PCR NEGATIVE NEGATIVE Final    Comment: (NOTE) The Xpert Xpress SARS-CoV-2/FLU/RSV plus assay is intended as an aid in the diagnosis of influenza from Nasopharyngeal swab specimens and should not be used as a sole basis for treatment. Nasal washings and aspirates are unacceptable for Xpert Xpress SARS-CoV-2/FLU/RSV testing.  Fact Sheet for Patients: EntrepreneurPulse.com.au  Fact Sheet for Healthcare Providers: IncredibleEmployment.be  This test is not yet approved or cleared by the [place] FDA and has been authorized for detection and/or diagnosis of SARS-CoV-2 by FDA under an Emergency Use Authorization (EUA). This EUA will remain in effect (meaning this test can be used) for the duration of the COVID-19 declaration under Section 564(b)(1) of the Act, 21 U.S.C. section 360bbb-3(b)(1), unless the authorization is terminated or revoked.  Performed at [facility], [place] [address] [place], [place] [address]   Blood Culture (routine x 2)     Status: Abnormal   Collection Time: 12/04/20  9:08 PM    Specimen: BLOOD RIGHT HAND  Result Value Ref Range Status   Specimen Description BLOOD RIGHT HAND  Final   Special Requests   Final    BOTTLES DRAWN AEROBIC AND ANAEROBIC Blood Culture adequate volume   Culture  Setup Time   Final    GRAM POSITIVE COCCI IN CHAINS IN PAIRS AEROBIC BOTTLE ONLY Organism ID to follow CRITICAL RESULT CALLED TO, READ BACK BY AND VERIFIED WITHJiles Garter [facility] PHARMD [id number] 12/05/20 A BROWNING    Culture (A)  Final    GROUP A STREP (S.PYOGENES) ISOLATED HEALTH DEPARTMENT NOTIFIED Performed at [facility], [place] [address] [place], [place] [address]    Report Status 12/08/2020 FINAL  Final   Organism ID, Bacteria GROUP A STREP (S.PYOGENES) ISOLATED  Final      Susceptibility   Group a strep (s.pyogenes) isolated - MIC*    PENICILLIN <=0.06 SENSITIVE Sensitive     CEFTRIAXONE <=0.12 SENSITIVE Sensitive     ERYTHROMYCIN >=8 RESISTANT Resistant     LEVOFLOXACIN <=0.25 SENSITIVE Sensitive     VANCOMYCIN 0.25 SENSITIVE Sensitive     * GROUP A STREP (S.PYOGENES) ISOLATED  Blood Culture (routine x 2)     Status: Abnormal   Collection Time: 12/04/20  9:08 PM   Specimen: BLOOD RIGHT ARM  Result Value Ref Range Status   Specimen Description BLOOD RIGHT ARM  Final   Special Requests   Final    BOTTLES DRAWN AEROBIC ONLY Blood Culture adequate volume   Culture  Setup Time   Final    GRAM POSITIVE COCCI IN CHAINS AEROBIC BOTTLE ONLY CRITICAL VALUE NOTED.  VALUE IS CONSISTENT WITH PREVIOUSLY REPORTED AND CALLED VALUE.    Culture (A)  Final    GROUP A STREP (S.PYOGENES) ISOLATED SUSCEPTIBILITIES PERFORMED ON PREVIOUS CULTURE WITHIN THE LAST 5 DAYS. HEALTH DEPARTMENT NOTIFIED Performed at [facility], [place] [address], [place], [place] [address]  Report Status 12/08/2020 FINAL  Final  Blood Culture ID Panel (Reflexed)     Status: Abnormal   Collection Time: 12/04/20  9:08 PM  Result Value Ref Range Status   Enterococcus faecalis NOT DETECTED NOT  DETECTED Final   Enterococcus Faecium NOT DETECTED NOT DETECTED Final   Listeria monocytogenes NOT DETECTED NOT DETECTED Final   Staphylococcus species NOT DETECTED NOT DETECTED Final   Staphylococcus aureus (BCID) NOT DETECTED NOT DETECTED Final   Staphylococcus epidermidis NOT DETECTED NOT DETECTED Final   Staphylococcus lugdunensis NOT DETECTED NOT DETECTED Final   Streptococcus species DETECTED (A) NOT DETECTED Final    Comment: CRITICAL RESULT CALLED TO, READ BACK BY AND VERIFIED WITHJiles Garter [facility] PHARMD [id number] 12/05/20 A BROWNING    Streptococcus agalactiae NOT DETECTED NOT DETECTED Final   Streptococcus pneumoniae NOT DETECTED NOT DETECTED Final   Streptococcus pyogenes DETECTED (A) NOT DETECTED Final    Comment: CRITICAL RESULT CALLED TO, READ BACK BY AND VERIFIED WITHJiles Garter [facility] PHARMD 1656 12/05/20 A BROWNING    A.calcoaceticus-baumannii NOT DETECTED NOT DETECTED Final   Bacteroides fragilis NOT DETECTED NOT DETECTED Final   Enterobacterales NOT DETECTED NOT DETECTED Final   Enterobacter cloacae complex NOT DETECTED NOT DETECTED Final   Escherichia coli NOT DETECTED NOT DETECTED Final   Klebsiella aerogenes NOT DETECTED NOT DETECTED Final   Klebsiella oxytoca NOT DETECTED NOT DETECTED Final   Klebsiella pneumoniae NOT DETECTED NOT DETECTED Final   Proteus species NOT DETECTED NOT DETECTED Final   Salmonella species NOT DETECTED NOT DETECTED Final   Serratia marcescens NOT DETECTED NOT DETECTED Final   Haemophilus influenzae NOT DETECTED NOT DETECTED Final   Neisseria meningitidis NOT DETECTED NOT DETECTED Final   Pseudomonas aeruginosa NOT DETECTED NOT DETECTED Final   Stenotrophomonas maltophilia NOT DETECTED NOT DETECTED Final   Candida albicans NOT DETECTED NOT DETECTED Final   Candida auris NOT DETECTED NOT DETECTED Final   Candida glabrata NOT DETECTED NOT DETECTED Final   Candida krusei NOT DETECTED NOT DETECTED Final   Candida parapsilosis NOT DETECTED NOT  DETECTED Final   Candida tropicalis NOT DETECTED NOT DETECTED Final   Cryptococcus neoformans/gattii NOT DETECTED NOT DETECTED Final    Comment: Performed at [facility], [address], [place], [place] [address]  Culture, blood (single)     Status: None   Collection Time: 12/05/20  1:30 AM   Specimen: BLOOD  Result Value Ref Range Status   Specimen Description BLOOD SITE NOT SPECIFIED  Final   Special Requests   Final    BOTTLES DRAWN AEROBIC AND ANAEROBIC Blood Culture adequate volume   Culture   Final    NO GROWTH 5 DAYS Performed at [facility], [address], [place], [place] [address]    Report Status 12/10/2020 FINAL  Final  Culture, blood (Routine X 2) w Reflex to ID Panel     Status: None (Preliminary result)   Collection Time: 12/07/20  1:02 AM   Specimen: BLOOD  Result Value Ref Range Status   Specimen Description BLOOD RIGHT HAND  Final   Special Requests   Final    BOTTLES DRAWN AEROBIC ONLY Blood Culture results may not be optimal due to an inadequate volume of blood received in culture bottles   Culture   Final    NO GROWTH 3 DAYS Performed at [facility], [place] [address] [place], [place] [address]    Report Status PENDING  Incomplete  Culture,  blood (Routine X 2) w Reflex to ID Panel     Status: None (Preliminary result)   Collection Time: 12/07/20 10:22 AM   Specimen: BLOOD RIGHT HAND  Result Value Ref Range Status   Specimen Description BLOOD RIGHT HAND  Final   Special Requests   Final    BOTTLES DRAWN AEROBIC ONLY Blood Culture results may not be optimal due to an inadequate volume of blood received in culture bottles   Culture   Final    NO GROWTH 3 DAYS Performed at [facility], [place] [address], [place], [place] [address]    Report Status PENDING  Incomplete    Studies/Results: MR CERVICAL SPINE W WO CONTRAST  Result Date: 12/10/2020 CLINICAL DATA:  Back pain. 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 EXAM: MRI CERVICAL AND THORACIC SPINE WITHOUT AND WITH CONTRAST TECHNIQUE: Multiplanar and multiecho pulse sequences of the cervical spine, to include the craniocervical junction and cervicothoracic junction, and the thoracic spine, were obtained without and with intravenous contrast. CONTRAST:  36mL GADAVIST GADOBUTROL 1 MMOL/ML IV SOLN COMPARISON:  CT angiogram chest 12/05/2020. FINDINGS: MRI CERVICAL SPINE FINDINGS Alignment: Straightening of the expected cervical lordosis. Mild cervical dextrocurvature, possibly positional. No significant spondylolisthesis. Vertebrae: Vertebral body height is maintained. No significant marrow edema or focal suspicious osseous lesion. Cord: No signal abnormality identified within the cervical spinal cord. No abnormal enhancement within the spinal canal. Posterior Fossa, vertebral arteries, paraspinal tissues: No abnormality identified within included portions of the posterior fossa. Flow voids preserved within the imaged cervical vertebral arteries. Paraspinal soft tissues unremarkable. Disc levels: No significant disc herniation, spinal canal stenosis or neural foraminal narrowing. MRI THORACIC SPINE FINDINGS Alignment:  Mild thoracic dextrocurvature.  No spondylolisthesis. Vertebrae: Vertebral body height is maintained. Nonspecific 5 mm round T2 STIR hyperintense and T1 hypointense lesion within the left aspect of the T6 vertebral body with possible subtle peripheral enhancement (for instance as seen on series 12, image 9) (series 15, image 19) (series 19, image 9). There are no findings to suggest discitis/osteomyelitis. Cord: No signal abnormality identified within the thoracic spinal cord. No abnormal enhancement within the thoracic spinal canal. Paraspinal and other soft tissues: Bilateral pleural effusions (small to moderate right, small left). No  abnormality identified within included portions of the upper abdomen/retroperitoneum. Paraspinal soft tissues unremarkable. Disc levels: Intervertebral disc height and hydration are maintained throughout the thoracic spine. No significant disc herniation, spinal canal stenosis or neural foraminal narrowing. IMPRESSION: MRI cervical spine: 1. No evidence of discitis/osteomyelitis or epidural abscess. 2. No significant disc herniation, spinal canal stenosis or neural foraminal narrowing. 3. Straightening of the expected cervical lordosis. 4. Mild cervical dextrocurvature, possibly positional. MRI thoracic spine: 1. No evidence of discitis/osteomyelitis or epidural abscess. 2. 5 mm round T2 STIR hyperintense and T1 hypointense lesion within the left aspect of the T6 vertebral body with possible subtle peripheral enhancement. The imaging features are nonspecific and this lesion is indeterminate in etiology. Short-interval, 3-6 month MRI follow-up is recommended to ensure stability. 3. No significant disc herniation, spinal canal stenosis or neural foraminal narrowing. 4. Mild thoracic dextrocurvature, possibly positional. 5. Bilateral pleural effusions (small-to-moderate right, small left). Electronically Signed   By: Kellie Simmering D.O.   On: 12/10/2020 10:28   MR THORACIC SPINE W WO CONTRAST  Result Date: 12/10/2020 CLINICAL DATA:  Back pain. 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 EXAM: MRI CERVICAL AND THORACIC SPINE WITHOUT AND WITH CONTRAST TECHNIQUE: Multiplanar and multiecho pulse sequences of the cervical spine, to include the craniocervical junction and cervicothoracic junction, and the thoracic spine, were obtained without and with intravenous contrast. CONTRAST:  19mL GADAVIST GADOBUTROL 1 MMOL/ML IV SOLN COMPARISON:  CT angiogram chest 12/05/2020. FINDINGS: MRI  CERVICAL SPINE FINDINGS Alignment: Straightening of the expected cervical lordosis. Mild cervical dextrocurvature, possibly positional. No significant spondylolisthesis. Vertebrae: Vertebral body height is maintained. No significant marrow edema or focal suspicious osseous lesion. Cord: No signal abnormality identified within the cervical spinal cord. No abnormal enhancement within the spinal canal. Posterior Fossa, vertebral arteries, paraspinal tissues: No abnormality identified within included portions of the posterior fossa. Flow voids preserved within the imaged cervical vertebral arteries. Paraspinal soft tissues unremarkable. Disc levels: No significant disc herniation, spinal canal stenosis or neural foraminal narrowing. MRI THORACIC SPINE FINDINGS Alignment:  Mild thoracic dextrocurvature.  No spondylolisthesis. Vertebrae: Vertebral body height is maintained. Nonspecific 5 mm round T2 STIR hyperintense and T1 hypointense lesion within the left aspect of the T6 vertebral body with possible subtle peripheral enhancement (for instance as seen on series 12, image 9) (series 15, image 19) (series 19, image 9). There are no findings to suggest discitis/osteomyelitis. Cord: No signal abnormality identified within the thoracic spinal cord. No abnormal enhancement within the thoracic spinal canal. Paraspinal and other soft tissues: Bilateral pleural effusions (small to moderate right, small left). No abnormality identified within included portions of the upper abdomen/retroperitoneum. Paraspinal soft tissues unremarkable. Disc levels: Intervertebral disc height and hydration are maintained throughout the thoracic spine. No significant disc herniation, spinal canal stenosis or neural foraminal narrowing. IMPRESSION: MRI cervical spine: 1. No evidence of discitis/osteomyelitis or epidural abscess. 2. No significant disc herniation, spinal canal stenosis or neural foraminal narrowing. 3. Straightening of the expected  cervical lordosis. 4. Mild cervical dextrocurvature, possibly positional. MRI thoracic spine: 1. No evidence of discitis/osteomyelitis or epidural abscess. 2. 5 mm round T2 STIR hyperintense and T1 hypointense lesion within the left aspect of the T6 vertebral body with possible subtle peripheral enhancement. The imaging features are nonspecific and this lesion is indeterminate in etiology. Short-interval, 3-6 month MRI follow-up is recommended to ensure stability. 3. No significant disc herniation, spinal canal stenosis or neural foraminal narrowing. 4. Mild thoracic dextrocurvature, possibly positional. 5. Bilateral pleural effusions (small-to-moderate right, small left). Electronically Signed   By: Kellie Simmering D.O.   On: 12/10/2020 10:28   [place] EKG SITE RITE  Result Date: 12/09/2020 If Site Rite image not attached, placement could not be confirmed due to current cardiac rhythm.     Assessment/Plan:  INTERVAL HISTORY: TEE completed  Principal Problem:   Acute deep vein thrombosis (DVT) of left upper extremity (HCC) Active Problems:   IVDU (intravenous drug user)   Sepsis ([place])   Thrombophlebitis   Hyponatremia   Pneumonia   Deep vein thrombosis (DVT) of left upper extremity, unspecified chronicity, unspecified vein (HCC)   Abscess of aortic root   Endocarditis of mitral valve   Endocarditis of tricuspid valve    Summer Cain is a 35 y.o. 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  #1  Group A streptococcus bacteremia with  tricuspid valve and mitral valve endocarditis with septic emboli to the lungs and there was concern for aortic root abscess  She has now had transesophageal echocardiogram which failed to show evidence of an aortic root abscess but does show evidence of both tricuspid valve and mitral  valve endocarditis  Continue penicillin to complete course of therapy for native valve endocarditis  Monitor for complications of endocarditis including complete heart block further embolization on antibiotics.  Cardiology and cardiothoracic surgery following  She will then get transesophageal echocardiogram and certainly if she has an aortic root abscess will need CT surgery  #2  Hepatitis C infection: Hepatitis C viral load pending  She does not have immunity to hepatitis B and would benefit from vaccination against hep B and likely would benefit from preexposure prophylaxis against HIV  #3 IV drug use: We will need a plan long-term for this  Deep venous thrombosis likely stopped septic thrombophlebitis from IV drug use on antibiotics and anticoagulation.  I spent 36 minutes with the patient including  face to face counseling of the patient regarding her endocarditis there is suspicion for aortic root abscess that was not found now on TEE along with personally reviewing her chest x-ray CBC BMP updated blood cultures along with personally reviewing radiographs, along with review of medical records in preparation for the visit and during the visit and in coordination of her care.   I am available for questions over the Thanksgiving holidays please call with questions/concerns.   LOS: 5 days   Acey Lav 12/10/2020, 2:33 PM

## 2020-12-11 DIAGNOSIS — B955 Unspecified streptococcus as the cause of diseases classified elsewhere: Secondary | ICD-10-CM | POA: Diagnosis present

## 2020-12-11 DIAGNOSIS — R7881 Bacteremia: Secondary | ICD-10-CM | POA: Diagnosis present

## 2020-12-11 DIAGNOSIS — E871 Hypo-osmolality and hyponatremia: Secondary | ICD-10-CM | POA: Diagnosis not present

## 2020-12-11 DIAGNOSIS — F1990 Other psychoactive substance use, unspecified, uncomplicated: Secondary | ICD-10-CM | POA: Diagnosis not present

## 2020-12-11 DIAGNOSIS — B182 Chronic viral hepatitis C: Secondary | ICD-10-CM

## 2020-12-11 DIAGNOSIS — F1910 Other psychoactive substance abuse, uncomplicated: Secondary | ICD-10-CM | POA: Diagnosis present

## 2020-12-11 DIAGNOSIS — I82622 Acute embolism and thrombosis of deep veins of left upper extremity: Secondary | ICD-10-CM | POA: Diagnosis not present

## 2020-12-11 DIAGNOSIS — I82A12 Acute embolism and thrombosis of left axillary vein: Secondary | ICD-10-CM | POA: Diagnosis not present

## 2020-12-11 LAB — COMPREHENSIVE METABOLIC PANEL
ALT: 20 U/L (ref 0–44)
AST: 20 U/L (ref 15–41)
Albumin: 1.7 g/dL — ABNORMAL LOW (ref 3.5–5.0)
Alkaline Phosphatase: 61 U/L (ref 38–126)
Anion gap: 4 — ABNORMAL LOW (ref 5–15)
BUN: 8 mg/dL (ref 6–20)
CO2: 31 mmol/L (ref 22–32)
Calcium: 8.4 mg/dL — ABNORMAL LOW (ref 8.9–10.3)
Chloride: 103 mmol/L (ref 98–111)
Creatinine, Ser: 0.77 mg/dL (ref 0.44–1.00)
GFR, Estimated: >60 mL/min (ref >60)
Glucose, Bld: 102 mg/dL — ABNORMAL HIGH (ref 70–99)
Potassium: 4.5 mmol/L (ref 3.5–5.1)
Sodium: 138 mmol/L (ref 135–145)
Total Bilirubin: 0.4 mg/dL (ref 0.3–1.2)
Total Protein: 5.4 g/dL — ABNORMAL LOW (ref 6.5–8.1)

## 2020-12-11 LAB — CBC WITH DIFFERENTIAL/PLATELET
Abs Immature Granulocytes: 0.17 10*3/uL — ABNORMAL HIGH (ref 0.00–0.07)
Basophils Absolute: 0 10*3/uL (ref 0.0–0.1)
Basophils Relative: 0 %
Eosinophils Absolute: 0 10*3/uL (ref 0.0–0.5)
Eosinophils Relative: 0 %
HCT: 32.3 % — ABNORMAL LOW (ref 36.0–46.0)
Hemoglobin: 10.1 g/dL — ABNORMAL LOW (ref 12.0–15.0)
Immature Granulocytes: 1 %
Lymphocytes Relative: 17 %
Lymphs Abs: 2 10*3/uL (ref 0.7–4.0)
MCH: 27.4 pg (ref 26.0–34.0)
MCHC: 31.3 g/dL (ref 30.0–36.0)
MCV: 87.8 fL (ref 80.0–100.0)
Monocytes Absolute: 0.6 10*3/uL (ref 0.1–1.0)
Monocytes Relative: 5 %
Neutro Abs: 9.2 10*3/uL — ABNORMAL HIGH (ref 1.7–7.7)
Neutrophils Relative %: 77 %
Platelets: 350 10*3/uL (ref 150–400)
RBC: 3.68 MIL/uL — ABNORMAL LOW (ref 3.87–5.11)
RDW: 14.8 % (ref 11.5–15.5)
WBC: 12 10*3/uL — ABNORMAL HIGH (ref 4.0–10.5)
nRBC: 0 % (ref 0.0–0.2)

## 2020-12-11 LAB — RAPID URINE DRUG SCREEN, HOSP PERFORMED
Amphetamines: NOT DETECTED
Barbiturates: NOT DETECTED
Benzodiazepines: NOT DETECTED
Cocaine: NOT DETECTED
Opiates: NOT DETECTED
Tetrahydrocannabinol: NOT DETECTED

## 2020-12-11 LAB — PHOSPHORUS: Phosphorus: 4 mg/dL (ref 2.5–4.6)

## 2020-12-11 LAB — MAGNESIUM: Magnesium: 1.9 mg/dL (ref 1.7–2.4)

## 2020-12-11 MED ORDER — HEPATITIS B VAC RECOMBINANT 10 MCG/ML IJ SUSP
1.0000 mL | Freq: Once | INTRAMUSCULAR | Status: AC
  Administered 2020-12-11: 10 ug via INTRAMUSCULAR
  Filled 2020-12-11: qty 1

## 2020-12-11 NOTE — Plan of Care (Signed)
  Problem: Elimination: Goal: Will not experience complications related to bowel motility Outcome: Progressing   Problem: Pain Managment: Goal: General experience of comfort will improve Outcome: Progressing   Problem: Skin Integrity: Goal: Risk for impaired skin integrity will decrease Outcome: Progressing   

## 2020-12-11 NOTE — Progress Notes (Signed)
PROGRESS NOTE    Renuka Farfan  ZOX:[id number] DOB: 1985-02-24 DOA: 12/04/2020 PCP: Patient, No Pcp Per (Inactive)   Brief Narrative:  Summer Cain is an 35 y.o. WF PMHx  IV drug abuse, opiate dependence hepatitis C with mitral valve vegetation on TTE July 2021, her cultures at that time were positive for Pseudomonas, MRSA and Serratia but unfortunately she left AMA comes into the ED for 1 week of left arm pain associated with swelling and redness she also has a lump on her chest wall and swollen lymph nodes.  She also relates scant hemoptysis and shortness of breath left upper extremity CT showed a DVT no abscess CT of the chest showed no PE or pneumonia started empirically on Vanco and Rocephin in the ED blood cultures were obtained showed strep pyogenes surveillance blood cultures negative.  2D echo showed multiple valve endocarditis, TEE was ordered, he is currently on IV penicillin and clindamycin CT of the maxillofacial showed no acute findings.   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   12/08/2020 TEE pending   Subjective: 11/24 afebrile overnight, negative CP, negative SOB.    Assessment & Plan:  Covid vaccination; vaccinated 2/3.  Requests third vaccination.  11/23 requested bivalent booster  Principal Problem:   Acute deep vein thrombosis (DVT) of left upper extremity (HCC) Active Problems:   Moderate cocaine use disorder (HCC)   IVDU (intravenous drug user)   Hepatitis C   Sepsis (HCC)   Opioid use disorder   Thrombophlebitis   Hyponatremia   Pneumonia   Deep vein thrombosis (DVT) of left upper extremity, unspecified chronicity, unspecified vein (HCC)   Abscess of aortic root   Endocarditis of mitral valve   Endocarditis of tricuspid valve   Polysubstance abuse (HCC)   Streptococcal bacteremia   Acute deep vein thrombosis (DVT) of left upper extremity  (HCC): -Provoked likely due to IV drug abuse was started on heparin. -CT of the chest showedLong segment venous thrombus extending into the left axillary vein at the junction of the subclavian vein  -Currently on methadone and oxycodone -11/24 currently on Lovenox will need to have LCSW review patient's insurance for lowest cost anticoagulant option   Sepsis/ left upper extremity cellulitis/positive strep pyogenes Bacteremia -Blood cultures 12/05/2020 positive strep pyogenes. Surveillance blood cultures from [id number] have been negative, we will go ahead and insert PICC line. -TEE scheduled for 12/10/2020.  Vegetations on valves see results below -CT surgery has been notified they recommended a TEE,  -Infectious disease recommended MRI of the cervical and lumbar spine which the patient refused to get, we have her scheduled for 12/09/2020 under anesthesia. -Continue IV penicillin -11/24 discussed case with Dr. Daiva Eves ID who is going to investigate into whether patient needs 4 or 6 weeks of IV antibiotics given her unusual bacteremia.   Cavitary lung lesion/pulmonary embolism: -Blood cultures were obtained, continue penicillin    Hyponatremia probably hypovolemic: -Resolved with IV fluid hydration.  Hepatitis C: -Per patient never received treatment. -Will need follow-up as an outpatient.  Hepatitis B - Per ID note 11/23 request patient received hepatitis B vaccination  IV drug abuse/Polysubstance abuse - July 2021 positive for cocaine - 11/23 UDS pending -Currently on methadone and oxy   DVT prophylaxis: Lovenox treatment dose Code Status: Full Family Communication:  Status is: Inpatient    Dispo: The patient is from: Home  Anticipated d/c is to: SNF              Anticipated d/c date is: > 3 days              Patient currently is not medically stable to d/c.      Consultants:  ID Dr. Daiva Eves Cardiothoracic surgery Dr. Dorris Fetch Cardiology Dr.  Shari Prows   Procedures/Significant Events:  11/23 MRI C-spine/T-spine MRI cervical spine:   1. No evidence of discitis/osteomyelitis or epidural abscess. 2. No significant disc herniation, spinal canal stenosis or neural foraminal narrowing. 3. Straightening of the expected cervical lordosis. 4. Mild cervical dextrocurvature, possibly positional.   MRI thoracic spine:   1. No evidence of discitis/osteomyelitis or epidural abscess. 2. 5 mm round T2 STIR hyperintense and T1 hypointense lesion within the left aspect of the T6 vertebral body with possible subtle peripheral enhancement. The imaging features are nonspecific and this lesion is indeterminate in etiology. Short-interval, 3-6 month MRI follow-up is recommended to ensure stability. 3. No significant disc herniation, spinal canal stenosis or neural foraminal narrowing. 4. Mild thoracic dextrocurvature, possibly positional. 5. Bilateral pleural effusions (small-to-moderate right, small left). 11/23 TEE Left Ventricle: Left ventricular ejection fraction, by estimation, is 60  to 65%. The left ventricle has normal function. The left ventricular  internal cavity size was normal in size.   Right Ventricle: The right ventricular size is normal. No increase in  right ventricular wall thickness. Right ventricular systolic function is  normal.   Left Atrium: Left atrial size was normal in size. No left atrial/left  atrial appendage thrombus was detected.   Right Atrium: Right atrial size was normal in size.   Pericardium: There is no evidence of pericardial effusion.   Mitral Valve: There are small mobile denisities visualized on the anterior  and posterior mitral valve leaflets (along A1/P1). Given clinical context  of bacteremia and IV drug use, findings concerning for underlying  vegetations. Trivial mitral  regurgitation.   Tricuspid Valve: There is thickening of the tricuspid valve septal leaflet  with small  fibrinous stranding visualized. Findings concerning for  vegetations given clinical context as detailed above. There is trace  tricuspid regurgitation.     Aortic Valve: \ The aortic valve is tricuspid. Aortic valve regurgitation  is not visualized. Suspect density seen on transgastric view was due to  off-axis view of AoV as is not independently mobile and is the same tissue  density of the AoV. Density not  seen on alternative views with lower suspicion of vegetation especially  given that there is no AR.      Aorta: Normal aortic root without evidence of abscess.   Pulmonary Artery: There is a mobile density visualized on the pulmonary  valve. While this may be related to artifact, cannot rule out vegetation.  There is trivial pulmonary regurgitation.  I have personally reviewed and interpreted all radiology studies and my findings are as above.  VENTILATOR SETTINGS:    Cultures   Antimicrobials: Anti-infectives (From admission, onward)    Start     Ordered Stop   12/09/20 1000  linezolid (ZYVOX) tablet 600 mg  Status:  Discontinued        12/09/20 0914 12/09/20 1443   12/06/20 0800  cefTRIAXone (ROCEPHIN) 2 g in sodium chloride 0.9 % 100 mL IVPB  Status:  Discontinued        12/05/20 0621 12/05/20 0629   12/05/20 2200  penicillin G potassium 12 Million Units in dextrose 5 %  500 mL continuous infusion        12/05/20 1711     12/05/20 2000  vancomycin (VANCOCIN) IVPB 750 mg/150 ml premix  Status:  Discontinued        12/05/20 0635 12/05/20 1242   12/05/20 2000  vancomycin (VANCOCIN) 500 mg in sodium chloride 0.9 % 100 mL IVPB  Status:  Discontinued        12/05/20 1242 12/05/20 1711   12/05/20 1800  clindamycin (CLEOCIN) IVPB 600 mg  Status:  Discontinued        12/05/20 1712 12/09/20 0914   12/05/20 0800  ceFEPIme (MAXIPIME) 2 g in sodium chloride 0.9 % 100 mL IVPB  Status:  Discontinued        12/05/20 0630 12/05/20 1711   12/05/20 0100  vancomycin (VANCOCIN) IVPB  1000 mg/200 mL premix        12/05/20 0051 12/05/20 0734   12/05/20 0100  cefTRIAXone (ROCEPHIN) 2 g in sodium chloride 0.9 % 100 mL IVPB        12/05/20 0051 12/05/20 0535         Devices    LINES / TUBES:      Continuous Infusions:  penicillin g continuous IV infusion 12 Million Units (12/11/20 1330)     Objective: Vitals:   12/11/20 0043 12/11/20 0243 12/11/20 0607 12/11/20 1110  BP:  126/68  111/67  Pulse:  (!) 56  64  Resp: 17 15  11   Temp:  98.3 F (36.8 C)  98 F (36.7 C)  TempSrc:  Oral  Oral  SpO2:  100%  100%  Weight:   53.3 kg   Height:        Intake/Output Summary (Last 24 hours) at 12/11/2020 1613 Last data filed at 12/11/2020 1200 Gross per 24 hour  Intake 150 ml  Output 1800 ml  Net -1650 ml   Filed Weights   12/08/20 0619 12/08/20 1013 12/11/20 0607  Weight: 49.6 kg 49.6 kg 53.3 kg    Examination:  General: A/O x4 No acute respiratory distress, cachectic Eyes: negative scleral hemorrhage, negative anisocoria, negative icterus ENT: Negative Runny nose, negative gingival bleeding, Neck:  Negative scars, masses, torticollis, lymphadenopathy, JVD Lungs: Clear to auscultation bilaterally without wheezes or crackles Cardiovascular: Regular rate and rhythm without murmur gallop or rub normal S1 and S2 Abdomen: negative abdominal pain, nondistended, positive soft, bowel sounds, no rebound, no ascites, no appreciable mass Extremities: No significant cyanosis, clubbing, or edema bilateral lower extremities Skin: Negative rashes, lesions, ulcers Psychiatric:  Negative depression, negative anxiety, negative fatigue, negative mania  Central nervous system:  Cranial nerves II through XII intact, tongue/uvula midline, all extremities muscle strength 5/5, sensation intact throughout, negative dysarthria, negative expressive aphasia, negative receptive aphasia.  .     Data Reviewed: Care during the described time interval was provided by me .  I have  reviewed this patient's available data, including medical history, events of note, physical examination, and all test results as part of my evaluation.   CBC: Recent Labs  Lab 12/04/20 2108 12/07/20 0126 12/09/20 0144 12/10/20 0500 12/11/20 0450  WBC 15.9* 7.8 10.7* 7.8 12.0*  NEUTROABS 12.5*  --   --   --  9.2*  HGB 11.0* 10.5* 11.4* 10.9* 10.1*  HCT 32.8* 32.9* 35.9* 34.8* 32.3*  MCV 83.7 85.0 85.5 87.2 87.8  PLT 181 188 363 321 350   Basic Metabolic Panel: Recent Labs  Lab 12/04/20 2108 12/07/20 0126 12/10/20 1250 12/11/20 0450  NA 127*  136 136 138  K 4.0 4.2 4.0 4.5  CL 91* 107 101 103  CO2 25 23 28 31   GLUCOSE 115* 90 137* 102*  BUN 14 7 <5* 8  CREATININE 0.72 0.59 0.66 0.77  CALCIUM 8.3* 7.7* 8.5* 8.4*  MG  --   --  1.8 1.9  PHOS  --   --  3.4 4.0   GFR: Estimated Creatinine Clearance: 71.8 mL/min (by C-G formula based on SCr of 0.77 mg/dL). Liver Function Tests: Recent Labs  Lab 12/04/20 2108 12/07/20 1045 12/10/20 1250 12/11/20 0450  AST 20  --  35 20  ALT 17  --  26 20  ALKPHOS 44  --  64 61  BILITOT 0.7  --  0.3 0.4  PROT 6.7  --  6.1* 5.4*  ALBUMIN 2.6* 1.8* 2.0* 1.7*   No results for input(s): LIPASE, AMYLASE in the last 168 hours. No results for input(s): AMMONIA in the last 168 hours. Coagulation Profile: Recent Labs  Lab 12/04/20 2108  INR 1.2   Cardiac Enzymes: No results for input(s): CKTOTAL, CKMB, CKMBINDEX, TROPONINI in the last 168 hours. BNP (last 3 results) No results for input(s): PROBNP in the last 8760 hours. HbA1C: No results for input(s): HGBA1C in the last 72 hours. CBG: No results for input(s): GLUCAP in the last 168 hours. Lipid Profile: No results for input(s): CHOL, HDL, LDLCALC, TRIG, CHOLHDL, LDLDIRECT in the last 72 hours. Thyroid Function Tests: No results for input(s): TSH, T4TOTAL, FREET4, T3FREE, THYROIDAB in the last 72 hours. Anemia Panel: No results for input(s): VITAMINB12, FOLATE, FERRITIN, TIBC,  IRON, RETICCTPCT in the last 72 hours. Urine analysis:    Component Value Date/Time   COLORURINE AMBER (A) 12/05/2020 0029   APPEARANCEUR HAZY (A) 12/05/2020 0029   LABSPEC 1.028 12/05/2020 0029   PHURINE 6.0 12/05/2020 0029   GLUCOSEU NEGATIVE 12/05/2020 0029   HGBUR NEGATIVE 12/05/2020 0029   BILIRUBINUR NEGATIVE 12/05/2020 0029   KETONESUR NEGATIVE 12/05/2020 0029   PROTEINUR 30 (A) 12/05/2020 0029   NITRITE NEGATIVE 12/05/2020 0029   LEUKOCYTESUR NEGATIVE 12/05/2020 0029   Sepsis Labs: @LABRCNTIP (procalcitonin:4,lacticidven:4)  ) Recent Results (from the past 240 hour(s))  Resp Panel by RT-PCR (Flu A&B, Covid) Peripheral     Status: None   Collection Time: 12/04/20  8:52 PM   Specimen: Peripheral; Nasopharyngeal(NP) swabs in vial transport medium  Result Value Ref Range Status   SARS Coronavirus 2 by RT PCR NEGATIVE NEGATIVE Final    Comment: (NOTE) SARS-CoV-2 target nucleic acids are NOT DETECTED.  The SARS-CoV-2 RNA is generally detectable in upper respiratory specimens during the acute phase of infection. The lowest concentration of SARS-CoV-2 viral copies this assay can detect is 138 copies/mL. A negative result does not preclude SARS-Cov-2 infection and should not be used as the sole basis for treatment or other patient management decisions. A negative result may occur with  improper specimen collection/handling, submission of specimen other than nasopharyngeal swab, presence of viral mutation(s) within the areas targeted by this assay, and inadequate number of viral copies(<138 copies/mL). A negative result must be combined with clinical observations, patient history, and epidemiological information. The expected result is Negative.  Fact Sheet for Patients:  BloggerCourse.com  Fact Sheet for Healthcare Providers:  SeriousBroker.it  This test is no t yet approved or cleared by the [place] FDA and  has  been authorized for detection and/or diagnosis of SARS-CoV-2 by FDA under an Emergency Use Authorization (EUA). This EUA will remain  in  effect (meaning this test can be used) for the duration of the COVID-19 declaration under Section 564(b)(1) of the Act, 21 U.S.C.section 360bbb-3(b)(1), unless the authorization is terminated  or revoked sooner.       Influenza A by PCR NEGATIVE NEGATIVE Final   Influenza B by PCR NEGATIVE NEGATIVE Final    Comment: (NOTE) The Xpert Xpress SARS-CoV-2/FLU/RSV plus assay is intended as an aid in the diagnosis of influenza from Nasopharyngeal swab specimens and should not be used as a sole basis for treatment. Nasal washings and aspirates are unacceptable for Xpert Xpress SARS-CoV-2/FLU/RSV testing.  Fact Sheet for Patients: BloggerCourse.com  Fact Sheet for Healthcare Providers: SeriousBroker.it  This test is not yet approved or cleared by the [place] FDA and has been authorized for detection and/or diagnosis of SARS-CoV-2 by FDA under an Emergency Use Authorization (EUA). This EUA will remain in effect (meaning this test can be used) for the duration of the COVID-19 declaration under Section 564(b)(1) of the Act, 21 U.S.C. section 360bbb-3(b)(1), unless the authorization is terminated or revoked.  Performed at [facility], [address], [place], [place] [address]   Blood Culture (routine x 2)     Status: Abnormal   Collection Time: 12/04/20  9:08 PM   Specimen: BLOOD RIGHT HAND  Result Value Ref Range Status   Specimen Description BLOOD RIGHT HAND  Final   Special Requests   Final    BOTTLES DRAWN AEROBIC AND ANAEROBIC Blood Culture adequate volume   Culture  Setup Time   Final    GRAM POSITIVE COCCI IN CHAINS IN PAIRS AEROBIC BOTTLE ONLY Organism ID to follow CRITICAL RESULT CALLED TO, READ BACK BY AND VERIFIED WITHCandiss Norse [facility] PHARMD [id number] 12/05/20 A BROWNING    Culture  (A)  Final    GROUP A STREP (S.PYOGENES) ISOLATED HEALTH DEPARTMENT NOTIFIED Performed at [facility], [address] [place], [place] [address]    Report Status 12/08/2020 FINAL  Final   Organism ID, Bacteria GROUP A STREP (S.PYOGENES) ISOLATED  Final      Susceptibility   Group a strep (s.pyogenes) isolated - MIC*    PENICILLIN <=0.06 SENSITIVE Sensitive     CEFTRIAXONE <=0.12 SENSITIVE Sensitive     ERYTHROMYCIN >=8 RESISTANT Resistant     LEVOFLOXACIN <=0.25 SENSITIVE Sensitive     VANCOMYCIN 0.25 SENSITIVE Sensitive     * GROUP A STREP (S.PYOGENES) ISOLATED  Blood Culture (routine x 2)     Status: Abnormal   Collection Time: 12/04/20  9:08 PM   Specimen: BLOOD RIGHT ARM  Result Value Ref Range Status   Specimen Description BLOOD RIGHT ARM  Final   Special Requests   Final    BOTTLES DRAWN AEROBIC ONLY Blood Culture adequate volume   Culture  Setup Time   Final    GRAM POSITIVE COCCI IN CHAINS AEROBIC BOTTLE ONLY CRITICAL VALUE NOTED.  VALUE IS CONSISTENT WITH PREVIOUSLY REPORTED AND CALLED VALUE.    Culture (A)  Final    GROUP A STREP (S.PYOGENES) ISOLATED SUSCEPTIBILITIES PERFORMED ON PREVIOUS CULTURE WITHIN THE LAST 5 DAYS. HEALTH DEPARTMENT NOTIFIED Performed at [facility], [address], [place], [place] [address]    Report Status 12/08/2020 FINAL  Final  Blood Culture ID Panel (Reflexed)     Status: Abnormal   Collection Time: 12/04/20  9:08 PM  Result Value Ref Range Status   Enterococcus faecalis NOT DETECTED NOT DETECTED Final   Enterococcus Faecium NOT  DETECTED NOT DETECTED Final   Listeria monocytogenes NOT DETECTED NOT DETECTED Final   Staphylococcus species NOT DETECTED NOT DETECTED Final   Staphylococcus aureus (BCID) NOT DETECTED NOT DETECTED Final   Staphylococcus epidermidis NOT DETECTED NOT DETECTED Final   Staphylococcus lugdunensis NOT DETECTED NOT DETECTED Final   Streptococcus species DETECTED (A) NOT DETECTED Final    Comment:  CRITICAL RESULT CALLED TO, READ BACK BY AND VERIFIED WITHCandiss Norse [facility] PHARMD [id number] 12/05/20 A BROWNING    Streptococcus agalactiae NOT DETECTED NOT DETECTED Final   Streptococcus pneumoniae NOT DETECTED NOT DETECTED Final   Streptococcus pyogenes DETECTED (A) NOT DETECTED Final    Comment: CRITICAL RESULT CALLED TO, READ BACK BY AND VERIFIED WITHCandiss Norse [facility] PHARMD [id number] 12/05/20 A BROWNING    A.calcoaceticus-baumannii NOT DETECTED NOT DETECTED Final   Bacteroides fragilis NOT DETECTED NOT DETECTED Final   Enterobacterales NOT DETECTED NOT DETECTED Final   Enterobacter cloacae complex NOT DETECTED NOT DETECTED Final   Escherichia coli NOT DETECTED NOT DETECTED Final   Klebsiella aerogenes NOT DETECTED NOT DETECTED Final   Klebsiella oxytoca NOT DETECTED NOT DETECTED Final   Klebsiella pneumoniae NOT DETECTED NOT DETECTED Final   Proteus species NOT DETECTED NOT DETECTED Final   Salmonella species NOT DETECTED NOT DETECTED Final   Serratia marcescens NOT DETECTED NOT DETECTED Final   Haemophilus influenzae NOT DETECTED NOT DETECTED Final   Neisseria meningitidis NOT DETECTED NOT DETECTED Final   Pseudomonas aeruginosa NOT DETECTED NOT DETECTED Final   Stenotrophomonas maltophilia NOT DETECTED NOT DETECTED Final   Candida albicans NOT DETECTED NOT DETECTED Final   Candida auris NOT DETECTED NOT DETECTED Final   Candida glabrata NOT DETECTED NOT DETECTED Final   Candida krusei NOT DETECTED NOT DETECTED Final   Candida parapsilosis NOT DETECTED NOT DETECTED Final   Candida tropicalis NOT DETECTED NOT DETECTED Final   Cryptococcus neoformans/gattii NOT DETECTED NOT DETECTED Final    Comment: Performed at [facility], [address] [place], [place] [address]  Culture, blood (single)     Status: None   Collection Time: 12/05/20  1:30 AM   Specimen: BLOOD  Result Value Ref Range Status   Specimen Description BLOOD SITE NOT SPECIFIED  Final   Special Requests   Final     BOTTLES DRAWN AEROBIC AND ANAEROBIC Blood Culture adequate volume   Culture   Final    NO GROWTH 5 DAYS Performed at [facility], [address] [place], [place] [address]    Report Status 12/10/2020 FINAL  Final  Culture, blood (Routine X 2) w Reflex to ID Panel     Status: None (Preliminary result)   Collection Time: 12/07/20  1:02 AM   Specimen: BLOOD  Result Value Ref Range Status   Specimen Description BLOOD RIGHT HAND  Final   Special Requests   Final    BOTTLES DRAWN AEROBIC ONLY Blood Culture results may not be optimal due to an inadequate volume of blood received in culture bottles   Culture   Final    NO GROWTH 4 DAYS Performed at [facility], [address], [place], [place] [address]    Report Status PENDING  Incomplete  Culture, blood (Routine X 2) w Reflex to ID Panel     Status: None (Preliminary result)   Collection Time: 12/07/20 10:22 AM   Specimen: BLOOD RIGHT HAND  Result Value Ref Range Status   Specimen Description BLOOD RIGHT HAND  Final  Special Requests   Final    BOTTLES DRAWN AEROBIC ONLY Blood Culture results may not be optimal due to an inadequate volume of blood received in culture bottles   Culture   Final    NO GROWTH 4 DAYS Performed at [facility], [address], [place], [place] [address]    Report Status PENDING  Incomplete  Surgical pcr screen     Status: Abnormal   Collection Time: 12/10/20  1:00 PM   Specimen: Nasal Mucosa; Nasal Swab  Result Value Ref Range Status   MRSA, PCR POSITIVE (A) NEGATIVE Final    Comment: RESULT CALLED TO, READ BACK BY AND VERIFIED WITH: Louie Boston RN [id number] 12/10/20 A BROWNING    Staphylococcus aureus POSITIVE (A) NEGATIVE Final    Comment: (NOTE) The Xpert SA Assay (FDA approved for NASAL specimens in patients 46 years of age and older), is one component of a comprehensive surveillance program. It is not intended to diagnose infection nor to guide or monitor treatment. Performed at  [facility], [address] [place], [place] [address]          Radiology Studies: MR CERVICAL SPINE W WO CONTRAST  Result Date: 12/10/2020 CLINICAL DATA:  Back pain. 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 EXAM: MRI CERVICAL AND THORACIC SPINE WITHOUT AND WITH CONTRAST TECHNIQUE: Multiplanar and multiecho pulse sequences of the cervical spine, to include the craniocervical junction and cervicothoracic junction, and the thoracic spine, were obtained without and with intravenous contrast. CONTRAST:  5mL GADAVIST GADOBUTROL 1 MMOL/ML IV SOLN COMPARISON:  CT angiogram chest 12/05/2020. FINDINGS: MRI CERVICAL SPINE FINDINGS Alignment: Straightening of the expected cervical lordosis. Mild cervical dextrocurvature, possibly positional. No significant spondylolisthesis. Vertebrae: Vertebral body height is maintained. No significant marrow edema or focal suspicious osseous lesion. Cord: No signal abnormality identified within the cervical spinal cord. No abnormal enhancement within the spinal canal. Posterior Fossa, vertebral arteries, paraspinal tissues: No abnormality identified within included portions of the posterior fossa. Flow voids preserved within the imaged cervical vertebral arteries. Paraspinal soft tissues unremarkable. Disc levels: No significant disc herniation, spinal canal stenosis or neural foraminal narrowing. MRI THORACIC SPINE FINDINGS Alignment:  Mild thoracic dextrocurvature.  No spondylolisthesis. Vertebrae: Vertebral body height is maintained. Nonspecific 5 mm round T2 STIR hyperintense and T1 hypointense lesion within the left aspect of the T6 vertebral body with possible subtle peripheral enhancement (for instance as seen on series 12, image 9) (series 15, image 19) (series 19, image 9). There are no findings to suggest  discitis/osteomyelitis. Cord: No signal abnormality identified within the thoracic spinal cord. No abnormal enhancement within the thoracic spinal canal. Paraspinal and other soft tissues: Bilateral pleural effusions (small to moderate right, small left). No abnormality identified within included portions of the upper abdomen/retroperitoneum. Paraspinal soft tissues unremarkable. Disc levels: Intervertebral disc height and hydration are maintained throughout the thoracic spine. No significant disc herniation, spinal canal stenosis or neural foraminal narrowing. IMPRESSION: MRI cervical spine: 1. No evidence of discitis/osteomyelitis or epidural abscess. 2. No significant disc herniation, spinal canal stenosis or neural foraminal narrowing. 3. Straightening of the expected cervical lordosis. 4. Mild cervical dextrocurvature, possibly positional. MRI thoracic spine: 1. No evidence of discitis/osteomyelitis or epidural abscess. 2. 5 mm round T2 STIR hyperintense and T1 hypointense lesion within the left aspect of the T6 vertebral body with possible subtle peripheral enhancement.  The imaging features are nonspecific and this lesion is indeterminate in etiology. Short-interval, 3-6 month MRI follow-up is recommended to ensure stability. 3. No significant disc herniation, spinal canal stenosis or neural foraminal narrowing. 4. Mild thoracic dextrocurvature, possibly positional. 5. Bilateral pleural effusions (small-to-moderate right, small left). Electronically Signed   By: Jackey Loge D.O.   On: 12/10/2020 10:28   MR THORACIC SPINE W WO CONTRAST  Result Date: 12/10/2020 CLINICAL DATA:  Back pain. 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 EXAM: MRI CERVICAL AND THORACIC SPINE WITHOUT AND WITH CONTRAST TECHNIQUE: Multiplanar and multiecho pulse sequences of the cervical  spine, to include the craniocervical junction and cervicothoracic junction, and the thoracic spine, were obtained without and with intravenous contrast. CONTRAST:  5mL GADAVIST GADOBUTROL 1 MMOL/ML IV SOLN COMPARISON:  CT angiogram chest 12/05/2020. FINDINGS: MRI CERVICAL SPINE FINDINGS Alignment: Straightening of the expected cervical lordosis. Mild cervical dextrocurvature, possibly positional. No significant spondylolisthesis. Vertebrae: Vertebral body height is maintained. No significant marrow edema or focal suspicious osseous lesion. Cord: No signal abnormality identified within the cervical spinal cord. No abnormal enhancement within the spinal canal. Posterior Fossa, vertebral arteries, paraspinal tissues: No abnormality identified within included portions of the posterior fossa. Flow voids preserved within the imaged cervical vertebral arteries. Paraspinal soft tissues unremarkable. Disc levels: No significant disc herniation, spinal canal stenosis or neural foraminal narrowing. MRI THORACIC SPINE FINDINGS Alignment:  Mild thoracic dextrocurvature.  No spondylolisthesis. Vertebrae: Vertebral body height is maintained. Nonspecific 5 mm round T2 STIR hyperintense and T1 hypointense lesion within the left aspect of the T6 vertebral body with possible subtle peripheral enhancement (for instance as seen on series 12, image 9) (series 15, image 19) (series 19, image 9). There are no findings to suggest discitis/osteomyelitis. Cord: No signal abnormality identified within the thoracic spinal cord. No abnormal enhancement within the thoracic spinal canal. Paraspinal and other soft tissues: Bilateral pleural effusions (small to moderate right, small left). No abnormality identified within included portions of the upper abdomen/retroperitoneum. Paraspinal soft tissues unremarkable. Disc levels: Intervertebral disc height and hydration are maintained throughout the thoracic spine. No significant disc herniation,  spinal canal stenosis or neural foraminal narrowing. IMPRESSION: MRI cervical spine: 1. No evidence of discitis/osteomyelitis or epidural abscess. 2. No significant disc herniation, spinal canal stenosis or neural foraminal narrowing. 3. Straightening of the expected cervical lordosis. 4. Mild cervical dextrocurvature, possibly positional. MRI thoracic spine: 1. No evidence of discitis/osteomyelitis or epidural abscess. 2. 5 mm round T2 STIR hyperintense and T1 hypointense lesion within the left aspect of the T6 vertebral body with possible subtle peripheral enhancement. The imaging features are nonspecific and this lesion is indeterminate in etiology. Short-interval, 3-6 month MRI follow-up is recommended to ensure stability. 3. No significant disc herniation, spinal canal stenosis or neural foraminal narrowing. 4. Mild thoracic dextrocurvature, possibly positional. 5. Bilateral pleural effusions (small-to-moderate right, small left). Electronically Signed   By: Jackey Loge D.O.   On: 12/10/2020 10:28   ECHO TEE  Result Date: 12/10/2020    TRANSESOPHOGEAL ECHO REPORT   Patient Name:   Summer Cain Date of Exam: 12/10/2020 Medical Rec #:  [id number]       Height:       60.0 in Accession #:    [id number]      Weight:       109.3 lb Date of Birth:  [birth year]  BSA:          1.444 m Patient Age:    35 years        BP:           117/60 mmHg Patient Gender: F               HR:           103 bpm. Exam Location:  Inpatient Procedure: 3D Echo, Transesophageal Echo, Color Doppler and Cardiac Doppler Indications:    Endocarditis  History:        Patient has prior history of Echocardiogram examinations, most                 recent 12/05/2020. Endocarditis; Signs/Symptoms:Chest Pain.                 IVDU. ETOH. Hepatitis.  Sonographer:    Sheralyn Boatman RDCS Referring Phys: [id number] DOROTHYANN MOURER [place] PROCEDURE: After discussion of the risks and benefits of a TEE, an informed consent was obtained from the patient. The  patient was intubated. The transesophogeal probe was passed without difficulty through the esophogus of the patient. Imaged were obtained with the patient in a supine position. Sedation performed by different physician. The patient was monitored while under deep sedation. Anesthestetic sedation was provided intravenously by Anesthesiology:  of Propofol. The patient's vital signs; including heart rate, blood pressure, and oxygen saturation; remained stable throughout the procedure. The patient developed no complications during the procedure. IMPRESSIONS  1. Left ventricular ejection fraction, by estimation, is 60 to 65%. The left ventricle has normal function.  2. Right ventricular systolic function is normal. The right ventricular size is normal.  3. No left atrial/left atrial appendage thrombus was detected.  4. There are small mobile denisities visualized on the anterior and posterior mitral valve leaflets (along A1/P1). Given clinical context of bacteremia and IV drug use, findings concerning for underlying vegetations. Trivial mitral regurgitation.  5. There is thickening of the tricuspid valve septal leaflet with small fibrinous stranding visualized. Findings concerning for vegetations given clinical context as detailed above. There is trace tricuspid regurgitation.  6. The aortic valve is tricuspid. Aortic valve regurgitation is not visualized. Suspect density seen on transgastric view was due to off-axis view of aortic valve as is not independently mobile and is the same tissue density of the aortic valve. Density  not seen on alternative views with lower suspicion of vegetation especially given that there is no aortic regurgitation.  7. There is a mobile density visualized on the pulmonary valve. While this may be related to artifact, cannot rule out vegetation. There is trivial pulmonary regurgitation.  8. Normal aortic root without evidence of root abscess.  9. Overall, there are vegetations  visualized on the mitral and tricuspid valves without evidence of significant valve destruction. There is no aortic root abscess. FINDINGS  Left Ventricle: Left ventricular ejection fraction, by estimation, is 60 to 65%. The left ventricle has normal function. The left ventricular internal cavity size was normal in size. Right Ventricle: The right ventricular size is normal. No increase in right ventricular wall thickness. Right ventricular systolic function is normal. Left Atrium: Left atrial size was normal in size. No left atrial/left atrial appendage thrombus was detected. Right Atrium: Right atrial size was normal in size. Pericardium: There is no evidence of pericardial effusion. Mitral Valve: There are small mobile denisities visualized on the anterior and posterior mitral valve leaflets (along A1/P1). Given clinical context of bacteremia and IV drug  use, findings concerning for underlying vegetations. Trivial mitral regurgitation. Tricuspid Valve: There is thickening of the tricuspid valve septal leaflet with small fibrinous stranding visualized. Findings concerning for vegetations given clinical context as detailed above. There is trace tricuspid regurgitation.  Aortic Valve: \ The aortic valve is tricuspid. Aortic valve regurgitation is not visualized. Suspect density seen on transgastric view was due to off-axis view of AoV as is not independently mobile and is the same tissue density of the AoV. Density not seen on alternative views with lower suspicion of vegetation especially given that there is no AR.  Aorta: Normal aortic root without evidence of abscess. Pulmonary Artery: There is a mobile density visualized on the pulmonary valve. While this may be related to artifact, cannot rule out vegetation. There is trivial pulmonary regurgitation. IAS/Shunts: No atrial level shunt detected by color flow Doppler. Laurance Flatten MD Electronically signed by Laurance Flatten MD Signature Date/Time:  12/10/2020/5:15:29 PM    Final         Scheduled Meds:  Chlorhexidine Gluconate Cloth  6 each Topical Daily   COVID-19 mRNA bivalent vaccine ([employer])  0.3 mL Intramuscular Once   enoxaparin (LOVENOX) injection  50 mg Subcutaneous Q12H   methadone  20 mg Oral Daily   mupirocin ointment  1 application Nasal BID   pantoprazole  40 mg Oral BID   sodium chloride flush  10-40 mL Intracatheter Q12H   Continuous Infusions:  penicillin g continuous IV infusion 12 Million Units (12/11/20 1330)     LOS: 6 days   The patient is critically ill with multiple organ systems failure and requires high complexity decision making for assessment and support, frequent evaluation and titration of therapies, application of advanced monitoring technologies and extensive interpretation of multiple databases. Critical Care Time devoted to patient care services described in this note  Time spent: 40 minutes     Colbi Schiltz, Roselind Messier, MD [facility]   If 7PM-7AM, please contact night-coverage 12/11/2020, 4:13 PM

## 2020-12-12 ENCOUNTER — Encounter (HOSPITAL_COMMUNITY): Payer: Self-pay | Admitting: Radiology

## 2020-12-12 ENCOUNTER — Inpatient Hospital Stay (HOSPITAL_COMMUNITY): Payer: Medicaid Other

## 2020-12-12 DIAGNOSIS — I82A12 Acute embolism and thrombosis of left axillary vein: Secondary | ICD-10-CM | POA: Diagnosis not present

## 2020-12-12 DIAGNOSIS — I82622 Acute embolism and thrombosis of deep veins of left upper extremity: Secondary | ICD-10-CM | POA: Diagnosis not present

## 2020-12-12 DIAGNOSIS — F1990 Other psychoactive substance use, unspecified, uncomplicated: Secondary | ICD-10-CM | POA: Diagnosis not present

## 2020-12-12 DIAGNOSIS — E871 Hypo-osmolality and hyponatremia: Secondary | ICD-10-CM | POA: Diagnosis not present

## 2020-12-12 LAB — COMPREHENSIVE METABOLIC PANEL
ALT: 26 U/L (ref 0–44)
AST: 29 U/L (ref 15–41)
Albumin: 1.8 g/dL — ABNORMAL LOW (ref 3.5–5.0)
Alkaline Phosphatase: 64 U/L (ref 38–126)
Anion gap: 5 (ref 5–15)
BUN: 12 mg/dL (ref 6–20)
CO2: 31 mmol/L (ref 22–32)
Calcium: 8.2 mg/dL — ABNORMAL LOW (ref 8.9–10.3)
Chloride: 101 mmol/L (ref 98–111)
Creatinine, Ser: 0.79 mg/dL (ref 0.44–1.00)
GFR, Estimated: >60 mL/min (ref >60)
Glucose, Bld: 87 mg/dL (ref 70–99)
Potassium: 4 mmol/L (ref 3.5–5.1)
Sodium: 137 mmol/L (ref 135–145)
Total Bilirubin: 0.3 mg/dL (ref 0.3–1.2)
Total Protein: 5.4 g/dL — ABNORMAL LOW (ref 6.5–8.1)

## 2020-12-12 LAB — CBC WITH DIFFERENTIAL/PLATELET
Abs Immature Granulocytes: 0.18 10*3/uL — ABNORMAL HIGH (ref 0.00–0.07)
Basophils Absolute: 0 10*3/uL (ref 0.0–0.1)
Basophils Relative: 0 %
Eosinophils Absolute: 0.1 10*3/uL (ref 0.0–0.5)
Eosinophils Relative: 1 %
HCT: 33.7 % — ABNORMAL LOW (ref 36.0–46.0)
Hemoglobin: 10.6 g/dL — ABNORMAL LOW (ref 12.0–15.0)
Immature Granulocytes: 2 %
Lymphocytes Relative: 41 %
Lymphs Abs: 3.5 10*3/uL (ref 0.7–4.0)
MCH: 28 pg (ref 26.0–34.0)
MCHC: 31.5 g/dL (ref 30.0–36.0)
MCV: 89.2 fL (ref 80.0–100.0)
Monocytes Absolute: 0.5 10*3/uL (ref 0.1–1.0)
Monocytes Relative: 6 %
Neutro Abs: 4.2 10*3/uL (ref 1.7–7.7)
Neutrophils Relative %: 50 %
Platelets: 329 10*3/uL (ref 150–400)
RBC: 3.78 MIL/uL — ABNORMAL LOW (ref 3.87–5.11)
RDW: 15.2 % (ref 11.5–15.5)
WBC: 8.5 10*3/uL (ref 4.0–10.5)
nRBC: 0 % (ref 0.0–0.2)

## 2020-12-12 LAB — CULTURE, BLOOD (ROUTINE X 2)
Culture: NO GROWTH
Culture: NO GROWTH

## 2020-12-12 LAB — PHOSPHORUS: Phosphorus: 5.1 mg/dL — ABNORMAL HIGH (ref 2.5–4.6)

## 2020-12-12 LAB — MAGNESIUM: Magnesium: 1.8 mg/dL (ref 1.7–2.4)

## 2020-12-12 LAB — TROPONIN I (HIGH SENSITIVITY)
Troponin I (High Sensitivity): 3 ng/L (ref <18)
Troponin I (High Sensitivity): 2 ng/L (ref <18)

## 2020-12-12 MED ORDER — APIXABAN 5 MG PO TABS
5.0000 mg | ORAL_TABLET | Freq: Two times a day (BID) | ORAL | Status: DC
  Administered 2020-12-13 – 2020-12-27 (×29): 5 mg via ORAL
  Filled 2020-12-12 (×29): qty 1

## 2020-12-12 MED ORDER — APIXABAN 5 MG PO TABS
10.0000 mg | ORAL_TABLET | Freq: Two times a day (BID) | ORAL | Status: AC
  Administered 2020-12-12 (×2): 10 mg via ORAL
  Filled 2020-12-12 (×2): qty 2

## 2020-12-12 NOTE — Progress Notes (Signed)
PROGRESS NOTE    Summer Cain  ZOX:[id number] DOB: 1985/12/07 DOA: 12/04/2020 PCP: Patient, No Pcp Per (Inactive)   Brief Narrative:  Summer Cain is an 35 y.o. WF PMHx  IV drug abuse, opiate dependence hepatitis C with mitral valve vegetation on TTE July 2021, her cultures at that time were positive for Pseudomonas, MRSA and Serratia but unfortunately she left AMA comes into the ED for 1 week of left arm pain associated with swelling and redness she also has a lump on her chest wall and swollen lymph nodes.  She also relates scant hemoptysis and shortness of breath left upper extremity CT showed a DVT no abscess CT of the chest showed no PE or pneumonia started empirically on Vanco and Rocephin in the ED blood cultures were obtained showed strep pyogenes surveillance blood cultures negative.  2D echo showed multiple valve endocarditis, TEE was ordered, he is currently on IV penicillin and clindamycin CT of the maxillofacial showed no acute findings.   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   12/08/2020 TEE pending   Subjective: 11/25 afebrile overnight C/O substernal/left lateral chest pain.  (Reproducible with palpation)    Assessment & Plan:  Covid vaccination; vaccinated 2/3.  Requests third vaccination.  11/23 requested bivalent booster  Principal Problem:   Acute deep vein thrombosis (DVT) of left upper extremity (HCC) Active Problems:   Moderate cocaine use disorder (HCC)   IVDU (intravenous drug user)   Hepatitis C   Sepsis (HCC)   Opioid use disorder   Thrombophlebitis   Hyponatremia   Pneumonia   Deep vein thrombosis (DVT) of left upper extremity, unspecified chronicity, unspecified vein (HCC)   Abscess of aortic root   Endocarditis of mitral valve   Endocarditis of tricuspid valve   Polysubstance abuse (HCC)   Streptococcal bacteremia   Acute deep vein  thrombosis (DVT) of left upper extremity (HCC): -Provoked likely due to IV drug abuse was started on heparin. -CT of the chest showedLong segment venous thrombus extending into the left axillary vein at the junction of the subclavian vein  -Currently on methadone and oxycodone -11/24 currently on Lovenox will need to have LCSW review patient's insurance for lowest cost anticoagulant option   Sepsis/ left upper extremity cellulitis/positive strep pyogenes Bacteremia -Blood cultures 12/05/2020 positive strep pyogenes. Surveillance blood cultures from [id number] have been negative, we will go ahead and insert PICC line. -TEE scheduled for 12/10/2020.  Vegetations on valves see results below -CT surgery has been notified they recommended a TEE,  -Infectious disease recommended MRI of the cervical and lumbar spine which the patient refused to get, we have her scheduled for 12/09/2020 under anesthesia. -Continue IV penicillin -11/24 discussed case with Dr. Daiva Eves ID who is going to investigate into whether patient needs 4 or 6 weeks of IV antibiotics given her unusual bacteremia. -11/25 discussed case with Dr. Daiva Eves ID: Recommend a minimum of 4 weeks IV antibiotics, he will try to convince patient to stay for 6 weeks.   Cavitary lung lesion/pulmonary embolism: -Blood cultures were obtained, continue penicillin   Chest pain - 11/25 substernal/left lateral aspect of thoracic cavity reproducible with palpation. - 11/25 EKG normal - 11/25 troponin WNL  Hyponatremia probably hypovolemic: -Resolved with IV fluid hydration.  Hepatitis C: -Per patient never received treatment. -Will need follow-up as an outpatient.  Hepatitis B - Per ID note 11/23 request patient receive hepatitis B  vaccination -11/24 ordered hepatitis B vaccination  IV drug abuse/Polysubstance abuse - July 2021 positive for cocaine - 11/24 UDS negative -Currently on methadone and oxy  Hypocalcemia -11/25 corrected  calcium= 9.9  DVT prophylaxis: Lovenox treatment dose Code Status: Full Family Communication:  Status is: Inpatient    Dispo: The patient is from: Home              Anticipated d/c is to: SNF              Anticipated d/c date is: > 3 days              Patient currently is not medically stable to d/c.      Consultants:  ID Dr. Daiva Eves Cardiothoracic surgery Dr. Dorris Fetch Cardiology Dr. Shari Prows   Procedures/Significant Events:  11/23 MRI C-spine/T-spine MRI cervical spine:   1. No evidence of discitis/osteomyelitis or epidural abscess. 2. No significant disc herniation, spinal canal stenosis or neural foraminal narrowing. 3. Straightening of the expected cervical lordosis. 4. Mild cervical dextrocurvature, possibly positional.   MRI thoracic spine:   1. No evidence of discitis/osteomyelitis or epidural abscess. 2. 5 mm round T2 STIR hyperintense and T1 hypointense lesion within the left aspect of the T6 vertebral body with possible subtle peripheral enhancement. The imaging features are nonspecific and this lesion is indeterminate in etiology. Short-interval, 3-6 month MRI follow-up is recommended to ensure stability. 3. No significant disc herniation, spinal canal stenosis or neural foraminal narrowing. 4. Mild thoracic dextrocurvature, possibly positional. 5. Bilateral pleural effusions (small-to-moderate right, small left). 11/23 TEE Left Ventricle: Left ventricular ejection fraction, by estimation, is 60  to 65%. The left ventricle has normal function. The left ventricular  internal cavity size was normal in size.   Right Ventricle: The right ventricular size is normal. No increase in  right ventricular wall thickness. Right ventricular systolic function is  normal.   Left Atrium: Left atrial size was normal in size. No left atrial/left  atrial appendage thrombus was detected.   Right Atrium: Right atrial size was normal in size.   Pericardium: There  is no evidence of pericardial effusion.   Mitral Valve: There are small mobile denisities visualized on the anterior  and posterior mitral valve leaflets (along A1/P1). Given clinical context  of bacteremia and IV drug use, findings concerning for underlying  vegetations. Trivial mitral  regurgitation.   Tricuspid Valve: There is thickening of the tricuspid valve septal leaflet  with small fibrinous stranding visualized. Findings concerning for  vegetations given clinical context as detailed above. There is trace  tricuspid regurgitation.     Aortic Valve: \ The aortic valve is tricuspid. Aortic valve regurgitation  is not visualized. Suspect density seen on transgastric view was due to  off-axis view of AoV as is not independently mobile and is the same tissue  density of the AoV. Density not  seen on alternative views with lower suspicion of vegetation especially  given that there is no AR.      Aorta: Normal aortic root without evidence of abscess.   Pulmonary Artery: There is a mobile density visualized on the pulmonary  valve. While this may be related to artifact, cannot rule out vegetation.  There is trivial pulmonary regurgitation.  I have personally reviewed and interpreted all radiology studies and my findings are as above.  VENTILATOR SETTINGS:    Cultures   Antimicrobials: Anti-infectives (From admission, onward)    Start     Ordered Stop   12/09/20  1000  linezolid (ZYVOX) tablet 600 mg  Status:  Discontinued        12/09/20 0914 12/09/20 1443   12/06/20 0800  cefTRIAXone (ROCEPHIN) 2 g in sodium chloride 0.9 % 100 mL IVPB  Status:  Discontinued        12/05/20 0621 12/05/20 0629   12/05/20 2200  penicillin G potassium 12 Million Units in dextrose 5 % 500 mL continuous infusion        12/05/20 1711     12/05/20 2000  vancomycin (VANCOCIN) IVPB 750 mg/150 ml premix  Status:  Discontinued        12/05/20 0635 12/05/20 1242   12/05/20 2000  vancomycin  (VANCOCIN) 500 mg in sodium chloride 0.9 % 100 mL IVPB  Status:  Discontinued        12/05/20 1242 12/05/20 1711   12/05/20 1800  clindamycin (CLEOCIN) IVPB 600 mg  Status:  Discontinued        12/05/20 1712 12/09/20 0914   12/05/20 0800  ceFEPIme (MAXIPIME) 2 g in sodium chloride 0.9 % 100 mL IVPB  Status:  Discontinued        12/05/20 0630 12/05/20 1711   12/05/20 0100  vancomycin (VANCOCIN) IVPB 1000 mg/200 mL premix        12/05/20 0051 12/05/20 0734   12/05/20 0100  cefTRIAXone (ROCEPHIN) 2 g in sodium chloride 0.9 % 100 mL IVPB        12/05/20 0051 12/05/20 0535         Devices    LINES / TUBES:      Continuous Infusions:  penicillin g continuous IV infusion 12 Million Units (12/12/20 0212)     Objective: Vitals:   12/11/20 1110 12/11/20 2020 12/11/20 2328 12/12/20 0358  BP: 111/67 128/72 (!) 109/52 (!) 111/45  Pulse: 64 62 60 60  Resp: Temp: 98 F (36.7 C) 98.2 F (36.8 C) 98.5 F (36.9 C) 98.3 F (36.8 C)  TempSrc: Oral Oral Oral Oral  SpO2: 100% 97% 98% 98%  Weight:    53.9 kg  Height:        Intake/Output Summary (Last 24 hours) at 12/12/2020 0825 Last data filed at 12/11/2020 1200 Gross per 24 hour  Intake --  Output 1000 ml  Net -1000 ml    Filed Weights   12/08/20 1013 12/11/20 0607 12/12/20 0358  Weight: 49.6 kg 53.3 kg 53.9 kg    Examination:  General: A/O x4 No acute respiratory distress, cachectic Eyes: negative scleral hemorrhage, negative anisocoria, negative icterus ENT: Negative Runny nose, negative gingival bleeding, Neck:  Negative scars, masses, torticollis, lymphadenopathy, JVD Lungs: Clear to auscultation bilaterally without wheezes or crackles.  Reproducible chest pain substernal over left breast, left lateral chest wall to palpation Cardiovascular: Regular rate and rhythm without murmur gallop or rub normal S1 and S2 Abdomen: negative abdominal pain, nondistended, positive soft, bowel sounds, no rebound, no  ascites, no appreciable mass Extremities: No significant cyanosis, clubbing, or edema bilateral lower extremities Skin: Negative rashes, lesions, ulcers Psychiatric:  Negative depression, negative anxiety, negative fatigue, negative mania  Central nervous system:  Cranial nerves II through XII intact, tongue/uvula midline, all extremities muscle strength 5/5, sensation intact throughout, negative dysarthria, negative expressive aphasia, negative receptive aphasia.  .     Data Reviewed: Care during the described time interval was provided by me .  I have reviewed this patient's available data, including medical history, events of note, physical examination, and all test results  as part of my evaluation.   CBC: Recent Labs  Lab 12/07/20 0126 12/09/20 0144 12/10/20 0500 12/11/20 0450 12/12/20 0500  WBC 7.8 10.7* 7.8 12.0* 8.5  NEUTROABS  --   --   --  9.2* 4.2  HGB 10.5* 11.4* 10.9* 10.1* 10.6*  HCT 32.9* 35.9* 34.8* 32.3* 33.7*  MCV 85.0 85.5 87.2 87.8 89.2  PLT 188 363 321 350 329    Basic Metabolic Panel: Recent Labs  Lab 12/07/20 0126 12/10/20 1250 12/11/20 0450 12/12/20 0500  NA 136 136 138 137  K 4.2 4.0 4.5 4.0  CL 107 101 103 101  CO2 23 28 31 31   GLUCOSE 90 137* 102* 87  BUN 7 <5* 8 12  CREATININE 0.59 0.66 0.77 0.79  CALCIUM 7.7* 8.5* 8.4* 8.2*  MG  --  1.8 1.9 1.8  PHOS  --  3.4 4.0 5.1*    GFR: Estimated Creatinine Clearance: 71.8 mL/min (by C-G formula based on SCr of 0.79 mg/dL). Liver Function Tests: Recent Labs  Lab 12/07/20 1045 12/10/20 1250 12/11/20 0450 12/12/20 0500  AST  --  35 20 29  ALT  --  26 20 26   ALKPHOS  --  64 61 64  BILITOT  --  0.3 0.4 0.3  PROT  --  6.1* 5.4* 5.4*  ALBUMIN 1.8* 2.0* 1.7* 1.8*    No results for input(s): LIPASE, AMYLASE in the last 168 hours. No results for input(s): AMMONIA in the last 168 hours. Coagulation Profile: No results for input(s): INR, PROTIME in the last 168 hours.  Cardiac Enzymes: No  results for input(s): CKTOTAL, CKMB, CKMBINDEX, TROPONINI in the last 168 hours. BNP (last 3 results) No results for input(s): PROBNP in the last 8760 hours. HbA1C: No results for input(s): HGBA1C in the last 72 hours. CBG: No results for input(s): GLUCAP in the last 168 hours. Lipid Profile: No results for input(s): CHOL, HDL, LDLCALC, TRIG, CHOLHDL, LDLDIRECT in the last 72 hours. Thyroid Function Tests: No results for input(s): TSH, T4TOTAL, FREET4, T3FREE, THYROIDAB in the last 72 hours. Anemia Panel: No results for input(s): VITAMINB12, FOLATE, FERRITIN, TIBC, IRON, RETICCTPCT in the last 72 hours. Urine analysis:    Component Value Date/Time   COLORURINE AMBER (A) 12/05/2020 0029   APPEARANCEUR HAZY (A) 12/05/2020 0029   LABSPEC 1.028 12/05/2020 0029   PHURINE 6.0 12/05/2020 0029   GLUCOSEU NEGATIVE 12/05/2020 0029   HGBUR NEGATIVE 12/05/2020 0029   BILIRUBINUR NEGATIVE 12/05/2020 0029   KETONESUR NEGATIVE 12/05/2020 0029   PROTEINUR 30 (A) 12/05/2020 0029   NITRITE NEGATIVE 12/05/2020 0029   LEUKOCYTESUR NEGATIVE 12/05/2020 0029   Sepsis Labs: @LABRCNTIP (procalcitonin:4,lacticidven:4)  ) Recent Results (from the past 240 hour(s))  Resp Panel by RT-PCR (Flu A&B, Covid) Peripheral     Status: None   Collection Time: 12/04/20  8:52 PM   Specimen: Peripheral; Nasopharyngeal(NP) swabs in vial transport medium  Result Value Ref Range Status   SARS Coronavirus 2 by RT PCR NEGATIVE NEGATIVE Final    Comment: (NOTE) SARS-CoV-2 target nucleic acids are NOT DETECTED.  The SARS-CoV-2 RNA is generally detectable in upper respiratory specimens during the acute phase of infection. The lowest concentration of SARS-CoV-2 viral copies this assay can detect is 138 copies/mL. A negative result does not preclude SARS-Cov-2 infection and should not be used as the sole basis for treatment or other patient management decisions. A negative result may occur with  improper specimen  collection/handling, submission of specimen other than nasopharyngeal swab, presence of  viral mutation(s) within the areas targeted by this assay, and inadequate number of viral copies(<138 copies/mL). A negative result must be combined with clinical observations, patient history, and epidemiological information. The expected result is Negative.  Fact Sheet for Patients:  BloggerCourse.com  Fact Sheet for Healthcare Providers:  SeriousBroker.it  This test is no t yet approved or cleared by the [place] FDA and  has been authorized for detection and/or diagnosis of SARS-CoV-2 by FDA under an Emergency Use Authorization (EUA). This EUA will remain  in effect (meaning this test can be used) for the duration of the COVID-19 declaration under Section 564(b)(1) of the Act, 21 U.S.C.section 360bbb-3(b)(1), unless the authorization is terminated  or revoked sooner.       Influenza A by PCR NEGATIVE NEGATIVE Final   Influenza B by PCR NEGATIVE NEGATIVE Final    Comment: (NOTE) The Xpert Xpress SARS-CoV-2/FLU/RSV plus assay is intended as an aid in the diagnosis of influenza from Nasopharyngeal swab specimens and should not be used as a sole basis for treatment. Nasal washings and aspirates are unacceptable for Xpert Xpress SARS-CoV-2/FLU/RSV testing.  Fact Sheet for Patients: BloggerCourse.com  Fact Sheet for Healthcare Providers: SeriousBroker.it  This test is not yet approved or cleared by the [place] FDA and has been authorized for detection and/or diagnosis of SARS-CoV-2 by FDA under an Emergency Use Authorization (EUA). This EUA will remain in effect (meaning this test can be used) for the duration of the COVID-19 declaration under Section 564(b)(1) of the Act, 21 U.S.C. section 360bbb-3(b)(1), unless the authorization is terminated or revoked.  Performed at [facility], [address], [place], [place] [address]   Blood Culture (routine x 2)     Status: Abnormal   Collection Time: 12/04/20  9:08 PM   Specimen: BLOOD RIGHT HAND  Result Value Ref Range Status   Specimen Description BLOOD RIGHT HAND  Final   Special Requests   Final    BOTTLES DRAWN AEROBIC AND ANAEROBIC Blood Culture adequate volume   Culture  Setup Time   Final    GRAM POSITIVE COCCI IN CHAINS IN PAIRS AEROBIC BOTTLE ONLY Organism ID to follow CRITICAL RESULT CALLED TO, READ BACK BY AND VERIFIED WITHCandiss Norse [facility] PHARMD [id number] 12/05/20 A BROWNING    Culture (A)  Final    GROUP A STREP (S.PYOGENES) ISOLATED HEALTH DEPARTMENT NOTIFIED Performed at [facility], [address], [place], [place] [address]    Report Status 12/08/2020 FINAL  Final   Organism ID, Bacteria GROUP A STREP (S.PYOGENES) ISOLATED  Final      Susceptibility   Group a strep (s.pyogenes) isolated - MIC*    PENICILLIN <=0.06 SENSITIVE Sensitive     CEFTRIAXONE <=0.12 SENSITIVE Sensitive     ERYTHROMYCIN >=8 RESISTANT Resistant     LEVOFLOXACIN <=0.25 SENSITIVE Sensitive     VANCOMYCIN 0.25 SENSITIVE Sensitive     * GROUP A STREP (S.PYOGENES) ISOLATED  Blood Culture (routine x 2)     Status: Abnormal   Collection Time: 12/04/20  9:08 PM   Specimen: BLOOD RIGHT ARM  Result Value Ref Range Status   Specimen Description BLOOD RIGHT ARM  Final   Special Requests   Final    BOTTLES DRAWN AEROBIC ONLY Blood Culture adequate volume   Culture  Setup Time   Final    GRAM POSITIVE COCCI IN CHAINS AEROBIC BOTTLE ONLY CRITICAL VALUE NOTED.  VALUE IS CONSISTENT WITH PREVIOUSLY REPORTED AND CALLED VALUE.  Culture (A)  Final    GROUP A STREP (S.PYOGENES) ISOLATED SUSCEPTIBILITIES PERFORMED ON PREVIOUS CULTURE WITHIN THE LAST 5 DAYS. HEALTH DEPARTMENT NOTIFIED Performed at [facility], [address], [place], [place] [address]    Report Status 12/08/2020 FINAL  Final  Blood Culture ID  Panel (Reflexed)     Status: Abnormal   Collection Time: 12/04/20  9:08 PM  Result Value Ref Range Status   Enterococcus faecalis NOT DETECTED NOT DETECTED Final   Enterococcus Faecium NOT DETECTED NOT DETECTED Final   Listeria monocytogenes NOT DETECTED NOT DETECTED Final   Staphylococcus species NOT DETECTED NOT DETECTED Final   Staphylococcus aureus (BCID) NOT DETECTED NOT DETECTED Final   Staphylococcus epidermidis NOT DETECTED NOT DETECTED Final   Staphylococcus lugdunensis NOT DETECTED NOT DETECTED Final   Streptococcus species DETECTED (A) NOT DETECTED Final    Comment: CRITICAL RESULT CALLED TO, READ BACK BY AND VERIFIED WITHCandiss Norse [facility] PHARMD [id number] 12/05/20 A BROWNING    Streptococcus agalactiae NOT DETECTED NOT DETECTED Final   Streptococcus pneumoniae NOT DETECTED NOT DETECTED Final   Streptococcus pyogenes DETECTED (A) NOT DETECTED Final    Comment: CRITICAL RESULT CALLED TO, READ BACK BY AND VERIFIED WITHCandiss Norse [facility] PHARMD [id number] 12/05/20 A BROWNING    A.calcoaceticus-baumannii NOT DETECTED NOT DETECTED Final   Bacteroides fragilis NOT DETECTED NOT DETECTED Final   Enterobacterales NOT DETECTED NOT DETECTED Final   Enterobacter cloacae complex NOT DETECTED NOT DETECTED Final   Escherichia coli NOT DETECTED NOT DETECTED Final   Klebsiella aerogenes NOT DETECTED NOT DETECTED Final   Klebsiella oxytoca NOT DETECTED NOT DETECTED Final   Klebsiella pneumoniae NOT DETECTED NOT DETECTED Final   Proteus species NOT DETECTED NOT DETECTED Final   Salmonella species NOT DETECTED NOT DETECTED Final   Serratia marcescens NOT DETECTED NOT DETECTED Final   Haemophilus influenzae NOT DETECTED NOT DETECTED Final   Neisseria meningitidis NOT DETECTED NOT DETECTED Final   Pseudomonas aeruginosa NOT DETECTED NOT DETECTED Final   Stenotrophomonas maltophilia NOT DETECTED NOT DETECTED Final   Candida albicans NOT DETECTED NOT DETECTED Final   Candida auris NOT DETECTED NOT DETECTED  Final   Candida glabrata NOT DETECTED NOT DETECTED Final   Candida krusei NOT DETECTED NOT DETECTED Final   Candida parapsilosis NOT DETECTED NOT DETECTED Final   Candida tropicalis NOT DETECTED NOT DETECTED Final   Cryptococcus neoformans/gattii NOT DETECTED NOT DETECTED Final    Comment: Performed at [facility], [address] [place], [place] [address]  Culture, blood (single)     Status: None   Collection Time: 12/05/20  1:30 AM   Specimen: BLOOD  Result Value Ref Range Status   Specimen Description BLOOD SITE NOT SPECIFIED  Final   Special Requests   Final    BOTTLES DRAWN AEROBIC AND ANAEROBIC Blood Culture adequate volume   Culture   Final    NO GROWTH 5 DAYS Performed at [facility], [address] [place], [place] [address]    Report Status 12/10/2020 FINAL  Final  Culture, blood (Routine X 2) w Reflex to ID Panel     Status: None (Preliminary result)   Collection Time: 12/07/20  1:02 AM   Specimen: BLOOD  Result Value Ref Range Status   Specimen Description BLOOD RIGHT HAND  Final   Special Requests   Final    BOTTLES DRAWN AEROBIC ONLY Blood Culture results may not be optimal due to an inadequate volume of  blood received in culture bottles   Culture   Final    NO GROWTH 4 DAYS Performed at [facility], [address], [place], [place] [address]    Report Status PENDING  Incomplete  Culture, blood (Routine X 2) w Reflex to ID Panel     Status: None (Preliminary result)   Collection Time: 12/07/20 10:22 AM   Specimen: BLOOD RIGHT HAND  Result Value Ref Range Status   Specimen Description BLOOD RIGHT HAND  Final   Special Requests   Final    BOTTLES DRAWN AEROBIC ONLY Blood Culture results may not be optimal due to an inadequate volume of blood received in culture bottles   Culture   Final    NO GROWTH 4 DAYS Performed at [facility], [address] [place], [place] [address]    Report Status PENDING  Incomplete  Surgical pcr screen      Status: Abnormal   Collection Time: 12/10/20  1:00 PM   Specimen: Nasal Mucosa; Nasal Swab  Result Value Ref Range Status   MRSA, PCR POSITIVE (A) NEGATIVE Final    Comment: RESULT CALLED TO, READ BACK BY AND VERIFIED WITH: Louie Boston RN [id number] 12/10/20 A BROWNING    Staphylococcus aureus POSITIVE (A) NEGATIVE Final    Comment: (NOTE) The Xpert SA Assay (FDA approved for NASAL specimens in patients 37 years of age and older), is one component of a comprehensive surveillance program. It is not intended to diagnose infection nor to guide or monitor treatment. Performed at [facility], [address], [place], [place] [address]          Radiology Studies: MR CERVICAL SPINE W WO CONTRAST  Result Date: 12/10/2020 CLINICAL DATA:  Back pain. 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 EXAM: MRI CERVICAL AND THORACIC SPINE WITHOUT AND WITH CONTRAST TECHNIQUE: Multiplanar and multiecho pulse sequences of the cervical spine, to include the craniocervical junction and cervicothoracic junction, and the thoracic spine, were obtained without and with intravenous contrast. CONTRAST:  5mL GADAVIST GADOBUTROL 1 MMOL/ML IV SOLN COMPARISON:  CT angiogram chest 12/05/2020. FINDINGS: MRI CERVICAL SPINE FINDINGS Alignment: Straightening of the expected cervical lordosis. Mild cervical dextrocurvature, possibly positional. No significant spondylolisthesis. Vertebrae: Vertebral body height is maintained. No significant marrow edema or focal suspicious osseous lesion. Cord: No signal abnormality identified within the cervical spinal cord. No abnormal enhancement within the spinal canal. Posterior Fossa, vertebral arteries, paraspinal tissues: No abnormality identified within included portions of the posterior fossa. Flow voids preserved within the imaged cervical  vertebral arteries. Paraspinal soft tissues unremarkable. Disc levels: No significant disc herniation, spinal canal stenosis or neural foraminal narrowing. MRI THORACIC SPINE FINDINGS Alignment:  Mild thoracic dextrocurvature.  No spondylolisthesis. Vertebrae: Vertebral body height is maintained. Nonspecific 5 mm round T2 STIR hyperintense and T1 hypointense lesion within the left aspect of the T6 vertebral body with possible subtle peripheral enhancement (for instance as seen on series 12, image 9) (series 15, image 19) (series 19, image 9). There are no findings to suggest discitis/osteomyelitis. Cord: No signal abnormality identified within the thoracic spinal cord. No abnormal enhancement within the thoracic spinal canal. Paraspinal and other soft tissues: Bilateral pleural effusions (small to moderate right, small left). No abnormality identified within included portions of the upper abdomen/retroperitoneum. Paraspinal soft tissues unremarkable. Disc levels: Intervertebral disc height and hydration are maintained  throughout the thoracic spine. No significant disc herniation, spinal canal stenosis or neural foraminal narrowing. IMPRESSION: MRI cervical spine: 1. No evidence of discitis/osteomyelitis or epidural abscess. 2. No significant disc herniation, spinal canal stenosis or neural foraminal narrowing. 3. Straightening of the expected cervical lordosis. 4. Mild cervical dextrocurvature, possibly positional. MRI thoracic spine: 1. No evidence of discitis/osteomyelitis or epidural abscess. 2. 5 mm round T2 STIR hyperintense and T1 hypointense lesion within the left aspect of the T6 vertebral body with possible subtle peripheral enhancement. The imaging features are nonspecific and this lesion is indeterminate in etiology. Short-interval, 3-6 month MRI follow-up is recommended to ensure stability. 3. No significant disc herniation, spinal canal stenosis or neural foraminal narrowing. 4. Mild thoracic  dextrocurvature, possibly positional. 5. Bilateral pleural effusions (small-to-moderate right, small left). Electronically Signed   By: Jackey Loge D.O.   On: 12/10/2020 10:28   MR THORACIC SPINE W WO CONTRAST  Result Date: 12/10/2020 CLINICAL DATA:  Back pain. 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 EXAM: MRI CERVICAL AND THORACIC SPINE WITHOUT AND WITH CONTRAST TECHNIQUE: Multiplanar and multiecho pulse sequences of the cervical spine, to include the craniocervical junction and cervicothoracic junction, and the thoracic spine, were obtained without and with intravenous contrast. CONTRAST:  59mL GADAVIST GADOBUTROL 1 MMOL/ML IV SOLN COMPARISON:  CT angiogram chest 12/05/2020. FINDINGS: MRI CERVICAL SPINE FINDINGS Alignment: Straightening of the expected cervical lordosis. Mild cervical dextrocurvature, possibly positional. No significant spondylolisthesis. Vertebrae: Vertebral body height is maintained. No significant marrow edema or focal suspicious osseous lesion. Cord: No signal abnormality identified within the cervical spinal cord. No abnormal enhancement within the spinal canal. Posterior Fossa, vertebral arteries, paraspinal tissues: No abnormality identified within included portions of the posterior fossa. Flow voids preserved within the imaged cervical vertebral arteries. Paraspinal soft tissues unremarkable. Disc levels: No significant disc herniation, spinal canal stenosis or neural foraminal narrowing. MRI THORACIC SPINE FINDINGS Alignment:  Mild thoracic dextrocurvature.  No spondylolisthesis. Vertebrae: Vertebral body height is maintained. Nonspecific 5 mm round T2 STIR hyperintense and T1 hypointense lesion within the left aspect of the T6 vertebral body with possible subtle peripheral enhancement (for instance as seen on series 12, image 9) (series  15, image 19) (series 19, image 9). There are no findings to suggest discitis/osteomyelitis. Cord: No signal abnormality identified within the thoracic spinal cord. No abnormal enhancement within the thoracic spinal canal. Paraspinal and other soft tissues: Bilateral pleural effusions (small to moderate right, small left). No abnormality identified within included portions of the upper abdomen/retroperitoneum. Paraspinal soft tissues unremarkable. Disc levels: Intervertebral disc height and hydration are maintained throughout the thoracic spine. No significant disc herniation, spinal canal stenosis or neural foraminal narrowing. IMPRESSION: MRI cervical spine: 1. No evidence of discitis/osteomyelitis or epidural abscess. 2. No significant disc herniation, spinal canal stenosis or neural foraminal narrowing. 3. Straightening of the expected cervical lordosis. 4. Mild cervical dextrocurvature, possibly positional. MRI thoracic spine: 1. No evidence of discitis/osteomyelitis or epidural abscess. 2. 5 mm round T2 STIR hyperintense and T1 hypointense lesion within the left aspect of the T6 vertebral body with possible subtle peripheral enhancement. The imaging features are nonspecific and this lesion is indeterminate in etiology. Short-interval, 3-6 month MRI follow-up is recommended to ensure stability. 3. No significant disc herniation, spinal canal stenosis or neural foraminal narrowing. 4. Mild thoracic dextrocurvature, possibly positional. 5. Bilateral pleural effusions (small-to-moderate right, small  left). Electronically Signed   By: Jackey Loge D.O.   On: 12/10/2020 10:28   ECHO TEE  Result Date: 12/10/2020    TRANSESOPHOGEAL ECHO REPORT   Patient Name:   DOMINO HOLTEN Date of Exam: 12/10/2020 Medical Rec #:  [id number]       Height:       60.0 in Accession #:    [id number]      Weight:       109.3 lb Date of Birth:  [birth year]       BSA:          1.444 m Patient Age:    35 years        BP:            117/60 mmHg Patient Gender: F               HR:           103 bpm. Exam Location:  Inpatient Procedure: 3D Echo, Transesophageal Echo, Color Doppler and Cardiac Doppler Indications:    Endocarditis  History:        Patient has prior history of Echocardiogram examinations, most                 recent 12/05/2020. Endocarditis; Signs/Symptoms:Chest Pain.                 IVDU. ETOH. Hepatitis.  Sonographer:    Sheralyn Boatman RDCS Referring Phys: [id number] MAKENLEE MCKEAG [place] PROCEDURE: After discussion of the risks and benefits of a TEE, an informed consent was obtained from the patient. The patient was intubated. The transesophogeal probe was passed without difficulty through the esophogus of the patient. Imaged were obtained with the patient in a supine position. Sedation performed by different physician. The patient was monitored while under deep sedation. Anesthestetic sedation was provided intravenously by Anesthesiology: 257mg  of Propofol. The patient's vital signs; including heart rate, blood pressure, and oxygen saturation; remained stable throughout the procedure. The patient developed no complications during the procedure. IMPRESSIONS  1. Left ventricular ejection fraction, by estimation, is 60 to 65%. The left ventricle has normal function.  2. Right ventricular systolic function is normal. The right ventricular size is normal.  3. No left atrial/left atrial appendage thrombus was detected.  4. There are small mobile denisities visualized on the anterior and posterior mitral valve leaflets (along A1/P1). Given clinical context of bacteremia and IV drug use, findings concerning for underlying vegetations. Trivial mitral regurgitation.  5. There is thickening of the tricuspid valve septal leaflet with small fibrinous stranding visualized. Findings concerning for vegetations given clinical context as detailed above. There is trace tricuspid regurgitation.  6. The aortic valve is tricuspid. Aortic valve regurgitation is  not visualized. Suspect density seen on transgastric view was due to off-axis view of aortic valve as is not independently mobile and is the same tissue density of the aortic valve. Density  not seen on alternative views with lower suspicion of vegetation especially given that there is no aortic regurgitation.  7. There is a mobile density visualized on the pulmonary valve. While this may be related to artifact, cannot rule out vegetation. There is trivial pulmonary regurgitation.  8. Normal aortic root without evidence of root abscess.  9. Overall, there are vegetations visualized on the mitral and tricuspid valves without evidence of significant valve destruction. There is no aortic root abscess. FINDINGS  Left Ventricle: Left ventricular ejection fraction, by estimation, is 60 to 65%. The left ventricle has normal  function. The left ventricular internal cavity size was normal in size. Right Ventricle: The right ventricular size is normal. No increase in right ventricular wall thickness. Right ventricular systolic function is normal. Left Atrium: Left atrial size was normal in size. No left atrial/left atrial appendage thrombus was detected. Right Atrium: Right atrial size was normal in size. Pericardium: There is no evidence of pericardial effusion. Mitral Valve: There are small mobile denisities visualized on the anterior and posterior mitral valve leaflets (along A1/P1). Given clinical context of bacteremia and IV drug use, findings concerning for underlying vegetations. Trivial mitral regurgitation. Tricuspid Valve: There is thickening of the tricuspid valve septal leaflet with small fibrinous stranding visualized. Findings concerning for vegetations given clinical context as detailed above. There is trace tricuspid regurgitation.  Aortic Valve: \ The aortic valve is tricuspid. Aortic valve regurgitation is not visualized. Suspect density seen on transgastric view was due to off-axis view of AoV as is not  independently mobile and is the same tissue density of the AoV. Density not seen on alternative views with lower suspicion of vegetation especially given that there is no AR.  Aorta: Normal aortic root without evidence of abscess. Pulmonary Artery: There is a mobile density visualized on the pulmonary valve. While this may be related to artifact, cannot rule out vegetation. There is trivial pulmonary regurgitation. IAS/Shunts: No atrial level shunt detected by color flow Doppler. Laurance Flatten MD Electronically signed by Laurance Flatten MD Signature Date/Time: 12/10/2020/5:15:29 PM    Final         Scheduled Meds:  Chlorhexidine Gluconate Cloth  6 each Topical Daily   COVID-19 mRNA bivalent vaccine (Pf[employer])  0.3 mL Intramuscular Once   enoxaparin (LOVENOX) injection  50 mg Subcutaneous Q12H   methadone  20 mg Oral Daily   mupirocin ointment  1 application Nasal BID   pantoprazole  40 mg Oral BID   sodium chloride flush  10-40 mL Intracatheter Q12H   Continuous Infusions:  penicillin g continuous IV infusion 12 Million Units (12/12/20 0212)     LOS: 7 days   The patient is critically ill with multiple organ systems failure and requires high complexity decision making for assessment and support, frequent evaluation and titration of therapies, application of advanced monitoring technologies and extensive interpretation of multiple databases. Critical Care Time devoted to patient care services described in this note  Time spent: 40 minutes     Quintin Hjort, Roselind Messier, MD [facility]   If 7PM-7AM, please contact night-coverage 12/12/2020, 8:25 AM

## 2020-12-12 NOTE — Progress Notes (Signed)
Pt has been Lovenox for DVT. Plan to transition to apixaban today. She has active Medicaid. She has gotten 6d of lovenox so will only need another day of load before maintenance. Ok per Dr. Joseph Art.  Ulyses Southward, PharmD, BCIDP, AAHIVP, CPP Infectious Disease Pharmacist 12/12/2020 9:54 AM

## 2020-12-13 DIAGNOSIS — F1990 Other psychoactive substance use, unspecified, uncomplicated: Secondary | ICD-10-CM | POA: Diagnosis not present

## 2020-12-13 DIAGNOSIS — I82622 Acute embolism and thrombosis of deep veins of left upper extremity: Secondary | ICD-10-CM | POA: Diagnosis not present

## 2020-12-13 DIAGNOSIS — E871 Hypo-osmolality and hyponatremia: Secondary | ICD-10-CM | POA: Diagnosis not present

## 2020-12-13 DIAGNOSIS — I82A12 Acute embolism and thrombosis of left axillary vein: Secondary | ICD-10-CM | POA: Diagnosis not present

## 2020-12-13 LAB — CBC WITH DIFFERENTIAL/PLATELET
Abs Immature Granulocytes: 0.24 10*3/uL — ABNORMAL HIGH (ref 0.00–0.07)
Basophils Absolute: 0 10*3/uL (ref 0.0–0.1)
Basophils Relative: 1 %
Eosinophils Absolute: 0.1 10*3/uL (ref 0.0–0.5)
Eosinophils Relative: 1 %
HCT: 33.7 % — ABNORMAL LOW (ref 36.0–46.0)
Hemoglobin: 10.5 g/dL — ABNORMAL LOW (ref 12.0–15.0)
Immature Granulocytes: 4 %
Lymphocytes Relative: 43 %
Lymphs Abs: 2.9 10*3/uL (ref 0.7–4.0)
MCH: 27.8 pg (ref 26.0–34.0)
MCHC: 31.2 g/dL (ref 30.0–36.0)
MCV: 89.2 fL (ref 80.0–100.0)
Monocytes Absolute: 0.5 10*3/uL (ref 0.1–1.0)
Monocytes Relative: 7 %
Neutro Abs: 2.9 10*3/uL (ref 1.7–7.7)
Neutrophils Relative %: 44 %
Platelets: 334 10*3/uL (ref 150–400)
RBC: 3.78 MIL/uL — ABNORMAL LOW (ref 3.87–5.11)
RDW: 15.3 % (ref 11.5–15.5)
WBC: 6.6 10*3/uL (ref 4.0–10.5)
nRBC: 0 % (ref 0.0–0.2)

## 2020-12-13 LAB — COMPREHENSIVE METABOLIC PANEL
ALT: 36 U/L (ref 0–44)
AST: 40 U/L (ref 15–41)
Albumin: 1.9 g/dL — ABNORMAL LOW (ref 3.5–5.0)
Alkaline Phosphatase: 84 U/L (ref 38–126)
Anion gap: 5 (ref 5–15)
BUN: 15 mg/dL (ref 6–20)
CO2: 33 mmol/L — ABNORMAL HIGH (ref 22–32)
Calcium: 8.5 mg/dL — ABNORMAL LOW (ref 8.9–10.3)
Chloride: 98 mmol/L (ref 98–111)
Creatinine, Ser: 0.84 mg/dL (ref 0.44–1.00)
GFR, Estimated: >60 mL/min (ref >60)
Glucose, Bld: 95 mg/dL (ref 70–99)
Potassium: 4.1 mmol/L (ref 3.5–5.1)
Sodium: 136 mmol/L (ref 135–145)
Total Bilirubin: 0.4 mg/dL (ref 0.3–1.2)
Total Protein: 5.5 g/dL — ABNORMAL LOW (ref 6.5–8.1)

## 2020-12-13 LAB — MAGNESIUM: Magnesium: 1.9 mg/dL (ref 1.7–2.4)

## 2020-12-13 LAB — PHOSPHORUS: Phosphorus: 5.3 mg/dL — ABNORMAL HIGH (ref 2.5–4.6)

## 2020-12-13 NOTE — Progress Notes (Signed)
PROGRESS NOTE    Summer Cain  ZOX:[id number] DOB: 05-16-1985 DOA: 12/04/2020 PCP: Patient, No Pcp Per (Inactive)   Brief Narrative:  Summer Cain is an 35 y.o. WF PMHx  IV drug abuse, opiate dependence hepatitis C with mitral valve vegetation on TTE July 2021, her cultures at that time were positive for Pseudomonas, MRSA and Serratia but unfortunately she left AMA comes into the ED for 1 week of left arm pain associated with swelling and redness she also has a lump on her chest wall and swollen lymph nodes.  She also relates scant hemoptysis and shortness of breath left upper extremity CT showed a DVT no abscess CT of the chest showed no PE or pneumonia started empirically on Vanco and Rocephin in the ED blood cultures were obtained showed strep pyogenes surveillance blood cultures negative.  2D echo showed multiple valve endocarditis, TEE was ordered, he is currently on IV penicillin and clindamycin CT of the maxillofacial showed no acute findings.   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   12/08/2020 TEE pending   Subjective: 11/26 A/O x4, chest pain much improved.   Assessment & Plan:  Covid vaccination; vaccinated 2/3.  Requests third vaccination.  11/23 requested bivalent booster  Principal Problem:   Acute deep vein thrombosis (DVT) of left upper extremity (HCC) Active Problems:   Moderate cocaine use disorder (HCC)   IVDU (intravenous drug user)   Hepatitis C   Sepsis (HCC)   Opioid use disorder   Thrombophlebitis   Hyponatremia   Pneumonia   Deep vein thrombosis (DVT) of left upper extremity, unspecified chronicity, unspecified vein (HCC)   Abscess of aortic root   Endocarditis of mitral valve   Endocarditis of tricuspid valve   Polysubstance abuse (HCC)   Streptococcal bacteremia   Acute deep vein thrombosis (DVT) of left upper extremity (HCC): -Provoked  likely due to IV drug abuse was started on heparin. -CT of the chest showedLong segment venous thrombus extending into the left axillary vein at the junction of the subclavian vein  -Currently on methadone and oxycodone -11/24 currently on Lovenox will need to have NCM review patient's insurance for lowest cost anticoagulant option   Sepsis/ left upper extremity cellulitis/positive strep pyogenes Bacteremia -Blood cultures 12/05/2020 positive strep pyogenes. Surveillance blood cultures from [id number] have been negative, we will go ahead and insert PICC line. -TEE scheduled for 12/10/2020.  Vegetations on valves see results below -CT surgery has been notified they recommended a TEE,  -Infectious disease recommended MRI of the cervical and lumbar spine which the patient refused to get, we have her scheduled for 12/09/2020 under anesthesia. -Continue IV penicillin -11/24 discussed case with Dr. Daiva Eves ID who is going to investigate into whether patient needs 4 or 6 weeks of IV antibiotics given her unusual bacteremia. -11/25 discussed case with Dr. Daiva Eves ID: Recommend a minimum of 4 weeks IV antibiotics, he will try to convince patient to stay for 6 weeks.   Cavitary lung lesion/pulmonary embolism: -Blood cultures were obtained, continue penicillin   Chest pain - 11/25 substernal/left lateral aspect of thoracic cavity reproducible with palpation. - 11/25 EKG normal - 11/25 troponin WNL  Hyponatremia probably hypovolemic: -Resolved with IV fluid hydration.  Hepatitis C: -Per patient never received treatment. -Will need follow-up as an outpatient.  Hepatitis B - Per ID note 11/23 request patient receive hepatitis B vaccination -11/24 ordered hepatitis B vaccination  IV drug abuse/Polysubstance abuse - July 2021 positive for cocaine - 11/24 UDS negative -Currently on methadone and oxy  Hypocalcemia -11/25 corrected calcium= 9.9  DVT prophylaxis: Lovenox treatment dose Code  Status: Full Family Communication:  Status is: Inpatient    Dispo: The patient is from: Home              Anticipated d/c is to: SNF              Anticipated d/c date is: > 3 days              Patient currently is not medically stable to d/c.      Consultants:  ID Dr. Daiva Eves Cardiothoracic surgery Dr. Dorris Fetch Cardiology Dr. Shari Prows   Procedures/Significant Events:  11/23 MRI C-spine/T-spine MRI cervical spine:   1. No evidence of discitis/osteomyelitis or epidural abscess. 2. No significant disc herniation, spinal canal stenosis or neural foraminal narrowing. 3. Straightening of the expected cervical lordosis. 4. Mild cervical dextrocurvature, possibly positional.   MRI thoracic spine:   1. No evidence of discitis/osteomyelitis or epidural abscess. 2. 5 mm round T2 STIR hyperintense and T1 hypointense lesion within the left aspect of the T6 vertebral body with possible subtle peripheral enhancement. The imaging features are nonspecific and this lesion is indeterminate in etiology. Short-interval, 3-6 month MRI follow-up is recommended to ensure stability. 3. No significant disc herniation, spinal canal stenosis or neural foraminal narrowing. 4. Mild thoracic dextrocurvature, possibly positional. 5. Bilateral pleural effusions (small-to-moderate right, small left). 11/23 TEE Left Ventricle: Left ventricular ejection fraction, by estimation, is 60  to 65%. The left ventricle has normal function. The left ventricular  internal cavity size was normal in size.  Mitral Valve: There are small mobile denisities visualized on the anterior  and posterior mitral valve leaflets (along A1/P1).  Tricuspid Valve: There is thickening of the tricuspid valve septal leaflet  with small fibrinous stranding visualized. Findings concerning for  vegetations    Pulmonary Artery: There is a mobile density visualized on the pulmonary  valve. While this may be related to artifact,  cannot rule out vegetation.  There is trivial pulmonary regurgitation.    I have personally reviewed and interpreted all radiology studies and my findings are as above.  VENTILATOR SETTINGS:    Cultures   Antimicrobials: Anti-infectives (From admission, onward)    Start     Ordered Stop   12/09/20 1000  linezolid (ZYVOX) tablet 600 mg  Status:  Discontinued        12/09/20 0914 12/09/20 1443   12/06/20 0800  cefTRIAXone (ROCEPHIN) 2 g in sodium chloride 0.9 % 100 mL IVPB  Status:  Discontinued        12/05/20 0621 12/05/20 0629   12/05/20 2200  penicillin G potassium 12 Million Units in dextrose 5 % 500 mL continuous infusion        12/05/20 1711     12/05/20 2000  vancomycin (VANCOCIN) IVPB 750 mg/150 ml premix  Status:  Discontinued        12/05/20 0635 12/05/20 1242   12/05/20 2000  vancomycin (VANCOCIN) 500 mg in sodium chloride 0.9 % 100 mL IVPB  Status:  Discontinued        12/05/20 1242 12/05/20 1711   12/05/20 1800  clindamycin (CLEOCIN) IVPB 600 mg  Status:  Discontinued        12/05/20 1712 12/09/20 0914   12/05/20 0800  ceFEPIme (MAXIPIME) 2 g in sodium chloride 0.9 % 100 mL IVPB  Status:  Discontinued        12/05/20 0630 12/05/20 1711   12/05/20 0100  vancomycin (VANCOCIN) IVPB 1000 mg/200 mL premix        12/05/20 0051 12/05/20 0734   12/05/20 0100  cefTRIAXone (ROCEPHIN) 2 g in sodium chloride 0.9 % 100 mL IVPB        12/05/20 0051 12/05/20 0535         Devices    LINES / TUBES:  RIGHT brachial PICC double-lumen 12/09/2020>>>>    Continuous Infusions:  penicillin g continuous IV infusion 12 Million Units (12/13/20 0243)     Objective: Vitals:   12/12/20 1645 12/12/20 2049 12/12/20 2319 12/13/20 0310  BP: (!) 96/50 (!) 121/59 (!) 105/43 (!) 105/47  Pulse: 74 75 70 66  Resp: 14 16 15 13   Temp: 98.3 F (36.8 C) 98 F (36.7 C) 98 F (36.7 C) 97.9 F (36.6 C)  TempSrc: Oral Oral Oral Oral  SpO2: 96% 97% 98% 99%  Weight:    54.2 kg  Height:         Intake/Output Summary (Last 24 hours) at 12/13/2020 12/15/2020 Last data filed at 12/12/2020 2155 Gross per 24 hour  Intake --  Output 3950 ml  Net -3950 ml    Filed Weights   12/11/20 0607 12/12/20 0358 12/13/20 0310  Weight: 53.3 kg 53.9 kg 54.2 kg    Examination:  General: A/O x4 No acute respiratory distress, cachectic Eyes: negative scleral hemorrhage, negative anisocoria, negative icterus ENT: Negative Runny nose, negative gingival bleeding, Neck:  Negative scars, masses, torticollis, lymphadenopathy, JVD Lungs: Clear to auscultation bilaterally without wheezes or crackles.   Cardiovascular: Regular rate and rhythm without murmur gallop or rub normal S1 and S2 Abdomen: negative abdominal pain, nondistended, positive soft, bowel sounds, no rebound, no ascites, no appreciable mass Extremities: No significant cyanosis, clubbing, or edema bilateral lower extremities Skin: Negative rashes, lesions, ulcers Psychiatric:  Negative depression, negative anxiety, negative fatigue, negative mania  Central nervous system:  Cranial nerves II through XII intact, tongue/uvula midline, all extremities muscle strength 5/5, sensation intact throughout, negative dysarthria, negative expressive aphasia, negative receptive aphasia.  .     Data Reviewed: Care during the described time interval was provided by me .  I have reviewed this patient's available data, including medical history, events of note, physical examination, and all test results as part of my evaluation.   CBC: Recent Labs  Lab 12/09/20 0144 12/10/20 0500 12/11/20 0450 12/12/20 0500 12/13/20 0500  WBC 10.7* 7.8 12.0* 8.5 6.6  NEUTROABS  --   --  9.2* 4.2 2.9  HGB 11.4* 10.9* 10.1* 10.6* 10.5*  HCT 35.9* 34.8* 32.3* 33.7* 33.7*  MCV 85.5 87.2 87.8 89.2 89.2  PLT 363 321 350 329 334    Basic Metabolic Panel: Recent Labs  Lab 12/07/20 0126 12/10/20 1250 12/11/20 0450 12/12/20 0500 12/13/20 0500  NA 136 136 138  137 136  K 4.2 4.0 4.5 4.0 4.1  CL 107 101 103 101 98  CO2 23 28 31 31  33*  GLUCOSE 90 137* 102* 87 95  BUN 7 <5* 8 12 15   CREATININE 0.59 0.66 0.77 0.79 0.84  CALCIUM 7.7* 8.5* 8.4* 8.2* 8.5*  MG  --  1.8 1.9 1.8 1.9  PHOS  --  3.4 4.0 5.1* 5.3*    GFR: Estimated Creatinine Clearance: 68.4 mL/min (by C-G formula based on SCr of 0.84 mg/dL). Liver Function Tests: Recent Labs  Lab 12/07/20 1045 12/10/20 1250 12/11/20  0450 12/12/20 0500 12/13/20 0500  AST  --  35 20 29 40  ALT  --  26 20 26  36  ALKPHOS  --  64 61 64 84  BILITOT  --  0.3 0.4 0.3 0.4  PROT  --  6.1* 5.4* 5.4* 5.5*  ALBUMIN 1.8* 2.0* 1.7* 1.8* 1.9*    No results for input(s): LIPASE, AMYLASE in the last 168 hours. No results for input(s): AMMONIA in the last 168 hours. Coagulation Profile: No results for input(s): INR, PROTIME in the last 168 hours.  Cardiac Enzymes: No results for input(s): CKTOTAL, CKMB, CKMBINDEX, TROPONINI in the last 168 hours. BNP (last 3 results) No results for input(s): PROBNP in the last 8760 hours. HbA1C: No results for input(s): HGBA1C in the last 72 hours. CBG: No results for input(s): GLUCAP in the last 168 hours. Lipid Profile: No results for input(s): CHOL, HDL, LDLCALC, TRIG, CHOLHDL, LDLDIRECT in the last 72 hours. Thyroid Function Tests: No results for input(s): TSH, T4TOTAL, FREET4, T3FREE, THYROIDAB in the last 72 hours. Anemia Panel: No results for input(s): VITAMINB12, FOLATE, FERRITIN, TIBC, IRON, RETICCTPCT in the last 72 hours. Urine analysis:    Component Value Date/Time   COLORURINE AMBER (A) 12/05/2020 0029   APPEARANCEUR HAZY (A) 12/05/2020 0029   LABSPEC 1.028 12/05/2020 0029   PHURINE 6.0 12/05/2020 0029   GLUCOSEU NEGATIVE 12/05/2020 0029   HGBUR NEGATIVE 12/05/2020 0029   BILIRUBINUR NEGATIVE 12/05/2020 0029   KETONESUR NEGATIVE 12/05/2020 0029   PROTEINUR 30 (A) 12/05/2020 0029   NITRITE NEGATIVE 12/05/2020 0029   LEUKOCYTESUR NEGATIVE  12/05/2020 0029   Sepsis Labs: @LABRCNTIP (procalcitonin:4,lacticidven:4)  ) Recent Results (from the past 240 hour(s))  Resp Panel by RT-PCR (Flu A&B, Covid) Peripheral     Status: None   Collection Time: 12/04/20  8:52 PM   Specimen: Peripheral; Nasopharyngeal(NP) swabs in vial transport medium  Result Value Ref Range Status   SARS Coronavirus 2 by RT PCR NEGATIVE NEGATIVE Final    Comment: (NOTE) SARS-CoV-2 target nucleic acids are NOT DETECTED.  The SARS-CoV-2 RNA is generally detectable in upper respiratory specimens during the acute phase of infection. The lowest concentration of SARS-CoV-2 viral copies this assay can detect is 138 copies/mL. A negative result does not preclude SARS-Cov-2 infection and should not be used as the sole basis for treatment or other patient management decisions. A negative result may occur with  improper specimen collection/handling, submission of specimen other than nasopharyngeal swab, presence of viral mutation(s) within the areas targeted by this assay, and inadequate number of viral copies(<138 copies/mL). A negative result must be combined with clinical observations, patient history, and epidemiological information. The expected result is Negative.  Fact Sheet for Patients:   Fact Sheet for Healthcare Providers:  12/06/20  This test is no t yet approved or cleared by the [employer] FDA and  has been authorized for detection and/or diagnosis of SARS-CoV-2 by FDA under an Emergency Use Authorization (EUA). This EUA will remain  in effect (meaning this test can be used) for the duration of the COVID-19 declaration under Section 564(b)(1) of the Act, 21 U.S.C.section 360bbb-3(b)(1), unless the authorization is terminated  or revoked sooner.       Influenza A by PCR NEGATIVE NEGATIVE Final   Influenza B by PCR NEGATIVE NEGATIVE Final    Comment: (NOTE) The  Xpert Xpress SARS-CoV-2/FLU/RSV plus assay is intended as an aid in the diagnosis of influenza from Nasopharyngeal swab specimens and should not be used  as a sole basis for treatment. Nasal washings and aspirates are unacceptable for Xpert Xpress SARS-CoV-2/FLU/RSV testing.  Fact Sheet for Patients: [employer]  Fact Sheet for Healthcare Providers: [employer]ious[employer]ker[employer]it  This test is not yet approved or cleared by the [place] FDA and has been authorized for detection and/or diagnosis of SARS-CoV-2 by FDA under an Emergency Use Authorization (EUA). This EUA will remain in effect (meaning this test can be used) for the duration of the COVID-19 declaration under Section 564(b)(1) of the Act, 21 U.S.C. section 360bbb-3(b)(1), unless the authorization is terminated or revoked.  Performed at [facility], [address] [place], [place] [address]   Blood Culture (routine x 2)     Status: Abnormal   Collection Time: 12/04/20  9:08 PM   Specimen: BLOOD RIGHT HAND  Result Value Ref Range Status   Specimen Description BLOOD RIGHT HAND  Final   Special Requests   Final    BOTTLES DRAWN AEROBIC AND ANAEROBIC Blood Culture adequate volume   Culture  Setup Time   Final    GRAM POSITIVE COCCI IN CHAINS IN PAIRS AEROBIC BOTTLE ONLY Organism ID to follow CRITICAL RESULT CALLED TO, READ BACK BY AND VERIFIED WITHCandiss Norse [facility] PHARMD [id number] 12/05/20 A BROWNING    Culture (A)  Final    GROUP A STREP (S.PYOGENES) ISOLATED HEALTH DEPARTMENT NOTIFIED Performed at [facility], [address] [place], [place] [address]    Report Status 12/08/2020 FINAL  Final   Organism ID, Bacteria GROUP A STREP (S.PYOGENES) ISOLATED  Final      Susceptibility   Group a strep (s.pyogenes) isolated - MIC*    PENICILLIN <=0.06 SENSITIVE Sensitive     CEFTRIAXONE <=0.12 SENSITIVE Sensitive     ERYTHROMYCIN >=8 RESISTANT Resistant      LEVOFLOXACIN <=0.25 SENSITIVE Sensitive     VANCOMYCIN 0.25 SENSITIVE Sensitive     * GROUP A STREP (S.PYOGENES) ISOLATED  Blood Culture (routine x 2)     Status: Abnormal   Collection Time: 12/04/20  9:08 PM   Specimen: BLOOD RIGHT ARM  Result Value Ref Range Status   Specimen Description BLOOD RIGHT ARM  Final   Special Requests   Final    BOTTLES DRAWN AEROBIC ONLY Blood Culture adequate volume   Culture  Setup Time   Final    GRAM POSITIVE COCCI IN CHAINS AEROBIC BOTTLE ONLY CRITICAL VALUE NOTED.  VALUE IS CONSISTENT WITH PREVIOUSLY REPORTED AND CALLED VALUE.    Culture (A)  Final    GROUP A STREP (S.PYOGENES) ISOLATED SUSCEPTIBILITIES PERFORMED ON PREVIOUS CULTURE WITHIN THE LAST 5 DAYS. HEALTH DEPARTMENT NOTIFIED Performed at [facility], [address], [place], [place] [address]    Report Status 12/08/2020 FINAL  Final  Blood Culture ID Panel (Reflexed)     Status: Abnormal   Collection Time: 12/04/20  9:08 PM  Result Value Ref Range Status   Enterococcus faecalis NOT DETECTED NOT DETECTED Final   Enterococcus Faecium NOT DETECTED NOT DETECTED Final   Listeria monocytogenes NOT DETECTED NOT DETECTED Final   Staphylococcus species NOT DETECTED NOT DETECTED Final   Staphylococcus aureus (BCID) NOT DETECTED NOT DETECTED Final   Staphylococcus epidermidis NOT DETECTED NOT DETECTED Final   Staphylococcus lugdunensis NOT DETECTED NOT DETECTED Final   Streptococcus species DETECTED (A) NOT DETECTED Final    Comment: CRITICAL RESULT CALLED TO, READ BACK BY AND VERIFIED WITHCandiss Norse [facility] PHARMD [id number] 12/05/20 A BROWNING  Streptococcus agalactiae NOT DETECTED NOT DETECTED Final   Streptococcus pneumoniae NOT DETECTED NOT DETECTED Final   Streptococcus pyogenes DETECTED (A) NOT DETECTED Final    Comment: CRITICAL RESULT CALLED TO, READ BACK BY AND VERIFIED WITHCandiss Norse [facility] PHARMD [id number] 12/05/20 A BROWNING    A.calcoaceticus-baumannii NOT DETECTED NOT DETECTED Final    Bacteroides fragilis NOT DETECTED NOT DETECTED Final   Enterobacterales NOT DETECTED NOT DETECTED Final   Enterobacter cloacae complex NOT DETECTED NOT DETECTED Final   Escherichia coli NOT DETECTED NOT DETECTED Final   Klebsiella aerogenes NOT DETECTED NOT DETECTED Final   Klebsiella oxytoca NOT DETECTED NOT DETECTED Final   Klebsiella pneumoniae NOT DETECTED NOT DETECTED Final   Proteus species NOT DETECTED NOT DETECTED Final   Salmonella species NOT DETECTED NOT DETECTED Final   Serratia marcescens NOT DETECTED NOT DETECTED Final   Haemophilus influenzae NOT DETECTED NOT DETECTED Final   Neisseria meningitidis NOT DETECTED NOT DETECTED Final   Pseudomonas aeruginosa NOT DETECTED NOT DETECTED Final   Stenotrophomonas maltophilia NOT DETECTED NOT DETECTED Final   Candida albicans NOT DETECTED NOT DETECTED Final   Candida auris NOT DETECTED NOT DETECTED Final   Candida glabrata NOT DETECTED NOT DETECTED Final   Candida krusei NOT DETECTED NOT DETECTED Final   Candida parapsilosis NOT DETECTED NOT DETECTED Final   Candida tropicalis NOT DETECTED NOT DETECTED Final   Cryptococcus neoformans/gattii NOT DETECTED NOT DETECTED Final    Comment: Performed at [facility], [address] [place], [place] [address]  Culture, blood (single)     Status: None   Collection Time: 12/05/20  1:30 AM   Specimen: BLOOD  Result Value Ref Range Status   Specimen Description BLOOD SITE NOT SPECIFIED  Final   Special Requests   Final    BOTTLES DRAWN AEROBIC AND ANAEROBIC Blood Culture adequate volume   Culture   Final    NO GROWTH 5 DAYS Performed at [facility], [address], [place], [place] [address]    Report Status 12/10/2020 FINAL  Final  Culture, blood (Routine X 2) w Reflex to ID Panel     Status: None   Collection Time: 12/07/20  1:02 AM   Specimen: BLOOD  Result Value Ref Range Status   Specimen Description BLOOD RIGHT HAND  Final   Special Requests   Final    BOTTLES  DRAWN AEROBIC ONLY Blood Culture results may not be optimal due to an inadequate volume of blood received in culture bottles   Culture   Final    NO GROWTH 5 DAYS Performed at [facility], [address], [place], [place] [address]    Report Status 12/12/2020 FINAL  Final  Culture, blood (Routine X 2) w Reflex to ID Panel     Status: None   Collection Time: 12/07/20 10:22 AM   Specimen: BLOOD RIGHT HAND  Result Value Ref Range Status   Specimen Description BLOOD RIGHT HAND  Final   Special Requests   Final    BOTTLES DRAWN AEROBIC ONLY Blood Culture results may not be optimal due to an inadequate volume of blood received in culture bottles   Culture   Final    NO GROWTH 5 DAYS Performed at [facility], [address], [place], [place] [address]    Report Status 12/12/2020 FINAL  Final  Surgical pcr screen     Status: Abnormal   Collection Time: 12/10/20  1:00 PM   Specimen:  Nasal Mucosa; Nasal Swab  Result Value Ref Range Status   MRSA, PCR POSITIVE (A) NEGATIVE Final    Comment: RESULT CALLED TO, READ BACK BY AND VERIFIED WITH: Louie Boston RN [id number] 12/10/20 A BROWNING    Staphylococcus aureus POSITIVE (A) NEGATIVE Final    Comment: (NOTE) The Xpert SA Assay (FDA approved for NASAL specimens in patients 30 years of age and older), is one component of a comprehensive surveillance program. It is not intended to diagnose infection nor to guide or monitor treatment. Performed at [facility], [address], [place], [place] [address]          Radiology Studies: DG CHEST PORT 1 VIEW  Result Date: 12/12/2020 CLINICAL DATA:  Chest pain EXAM: PORTABLE CHEST 1 VIEW COMPARISON:  12/05/2020 chest radiograph. FINDINGS: Right PICC terminates at the cavoatrial junction. Stable cardiomediastinal silhouette with normal heart size. No pneumothorax. No pleural effusion. Lungs appear clear, with no acute consolidative airspace disease and no pulmonary edema. IMPRESSION: No  active disease. Electronically Signed   By: Delbert Phenix M.D.   On: 12/12/2020 11:19        Scheduled Meds:  apixaban  5 mg Oral BID   Chlorhexidine Gluconate Cloth  6 each Topical Daily   COVID-19 mRNA bivalent vaccine (Pfizer)  0.3 mL Intramuscular Once   methadone  20 mg Oral Daily   mupirocin ointment  1 application Nasal BID   pantoprazole  40 mg Oral BID   sodium chloride flush  10-40 mL Intracatheter Q12H   Continuous Infusions:  penicillin g continuous IV infusion 12 Million Units (12/13/20 0243)     LOS: 8 days   The patient is critically ill with multiple organ systems failure and requires high complexity decision making for assessment and support, frequent evaluation and titration of therapies, application of advanced monitoring technologies and extensive interpretation of multiple databases. Critical Care Time devoted to patient care services described in this note  Time spent: 40 minutes     Saralee Bolick, Roselind Messier, MD [facility]   If 7PM-7AM, please contact night-coverage 12/13/2020, 6:53 AM

## 2020-12-14 ENCOUNTER — Encounter (HOSPITAL_COMMUNITY): Payer: Self-pay | Admitting: Cardiology

## 2020-12-14 DIAGNOSIS — F1990 Other psychoactive substance use, unspecified, uncomplicated: Secondary | ICD-10-CM | POA: Diagnosis not present

## 2020-12-14 DIAGNOSIS — E871 Hypo-osmolality and hyponatremia: Secondary | ICD-10-CM | POA: Diagnosis not present

## 2020-12-14 DIAGNOSIS — I82A12 Acute embolism and thrombosis of left axillary vein: Secondary | ICD-10-CM | POA: Diagnosis not present

## 2020-12-14 DIAGNOSIS — I82622 Acute embolism and thrombosis of deep veins of left upper extremity: Secondary | ICD-10-CM | POA: Diagnosis not present

## 2020-12-14 LAB — CBC WITH DIFFERENTIAL/PLATELET
Abs Immature Granulocytes: 0.23 10*3/uL — ABNORMAL HIGH (ref 0.00–0.07)
Basophils Absolute: 0.1 10*3/uL (ref 0.0–0.1)
Basophils Relative: 1 %
Eosinophils Absolute: 0.1 10*3/uL (ref 0.0–0.5)
Eosinophils Relative: 2 %
HCT: 36.2 % (ref 36.0–46.0)
Hemoglobin: 11.3 g/dL — ABNORMAL LOW (ref 12.0–15.0)
Immature Granulocytes: 3 %
Lymphocytes Relative: 44 %
Lymphs Abs: 3.3 10*3/uL (ref 0.7–4.0)
MCH: 27.6 pg (ref 26.0–34.0)
MCHC: 31.2 g/dL (ref 30.0–36.0)
MCV: 88.5 fL (ref 80.0–100.0)
Monocytes Absolute: 0.5 10*3/uL (ref 0.1–1.0)
Monocytes Relative: 7 %
Neutro Abs: 3.2 10*3/uL (ref 1.7–7.7)
Neutrophils Relative %: 43 %
Platelets: 393 10*3/uL (ref 150–400)
RBC: 4.09 MIL/uL (ref 3.87–5.11)
RDW: 15.6 % — ABNORMAL HIGH (ref 11.5–15.5)
WBC: 7.4 10*3/uL (ref 4.0–10.5)
nRBC: 0 % (ref 0.0–0.2)

## 2020-12-14 LAB — PHOSPHORUS: Phosphorus: 5.3 mg/dL — ABNORMAL HIGH (ref 2.5–4.6)

## 2020-12-14 LAB — COMPREHENSIVE METABOLIC PANEL
ALT: 41 U/L (ref 0–44)
AST: 36 U/L (ref 15–41)
Albumin: 2.2 g/dL — ABNORMAL LOW (ref 3.5–5.0)
Alkaline Phosphatase: 98 U/L (ref 38–126)
Anion gap: 6 (ref 5–15)
BUN: 14 mg/dL (ref 6–20)
CO2: 31 mmol/L (ref 22–32)
Calcium: 8.6 mg/dL — ABNORMAL LOW (ref 8.9–10.3)
Chloride: 98 mmol/L (ref 98–111)
Creatinine, Ser: 0.81 mg/dL (ref 0.44–1.00)
GFR, Estimated: >60 mL/min (ref >60)
Glucose, Bld: 99 mg/dL (ref 70–99)
Potassium: 4.5 mmol/L (ref 3.5–5.1)
Sodium: 135 mmol/L (ref 135–145)
Total Bilirubin: 0.2 mg/dL — ABNORMAL LOW (ref 0.3–1.2)
Total Protein: 6 g/dL — ABNORMAL LOW (ref 6.5–8.1)

## 2020-12-14 LAB — MAGNESIUM: Magnesium: 2.2 mg/dL (ref 1.7–2.4)

## 2020-12-14 NOTE — TOC Initial Note (Signed)
Transition of Care [facility]) - Initial/Assessment Note    Patient Details  Name: Summer Cain MRN: [id number] Date of Birth: January 11, 1986  Transition of Care [facility]) CM/SW Contact:    Lockie Pares, RN Phone Number: 12/14/2020, 5:14 PM  Clinical Narrative:                 35 year old patient admitted with arm swelling DVT, has history of IV drug use. Had endocarditis did not follow up. TEE this hospitalization shows endocarditis, not surgical at this time per TCTS. Will likely need 6 weeks of IV antibiotics and stay inh house, not a candidate for outpatient IV antibiotics.  CM will continue to follow for needs, recommendations, and transitions   Expected Discharge Plan: Home/Self Care Barriers to Discharge: Continued Medical Work up   Patient Goals and CMS Choice        Expected Discharge Plan and Services Expected Discharge Plan: Home/Self Care   Discharge Planning Services: CM Consult   Living arrangements for the past 2 months: Single Family Home                                      Prior Living Arrangements/Services Living arrangements for the past 2 months: Single Family Home Lives with:: Significant Other Patient language and need for interpreter reviewed:: Yes        Need for Family Participation in Patient Care: Yes (Comment) Care giver support system in place?: Yes (comment)   Criminal Activity/Legal Involvement Pertinent to Current Situation/Hospitalization: No - Comment as needed  Activities of Daily Living Home Assistive Devices/Equipment: None ADL Screening (condition at time of admission) Patient's cognitive ability adequate to safely complete daily activities?: No Is the patient deaf or have difficulty hearing?: No Does the patient have difficulty seeing, even when wearing glasses/contacts?: No Does the patient have difficulty concentrating, remembering, or making decisions?: Yes Patient able to express need for assistance with ADLs?:  Yes Does the patient have difficulty dressing or bathing?: No Independently performs ADLs?: Yes (appropriate for developmental age) Does the patient have difficulty walking or climbing stairs?: No Weakness of Legs: Both Weakness of Arms/Hands: Both  Permission Sought/Granted                  Emotional Assessment       Orientation: : Oriented to Self, Oriented to  Time, Oriented to Situation, Oriented to Place Alcohol / Substance Use: Illicit Drugs Psych Involvement: No (comment)  Admission diagnosis:  Thrombophlebitis [I80.9] Left arm cellulitis [L03.114] Sepsis (HCC) [A41.9] Acute deep vein thrombosis (DVT) of axillary vein of left upper extremity (HCC) [place].A12] Deep vein thrombosis (DVT) of left upper extremity, unspecified chronicity, unspecified vein (HCC) [I82.622] Patient Active Problem List   Diagnosis Date Noted   Polysubstance abuse (HCC) 12/11/2020   Streptococcal bacteremia 12/11/2020   Abscess of aortic root    Endocarditis of mitral valve    Endocarditis of tricuspid valve    Thrombophlebitis 12/05/2020   Acute deep vein thrombosis (DVT) of left upper extremity (HCC) 12/05/2020   Hyponatremia 12/05/2020   Pneumonia 12/05/2020   Deep vein thrombosis (DVT) of left upper extremity, unspecified chronicity, unspecified vein (HCC) 12/05/2020   Left arm cellulitis    Opioid use disorder 09/16/2019   Soft tissue infection 09/16/2019   Chest pain 09/15/2019   Sepsis (HCC) 08/11/2019   Cellulitis of right lower extremity 08/10/2019   Hepatitis 04/01/2019  IVDU (intravenous drug user) 04/01/2019   Bacteriuria with pyuria 04/01/2019   Hepatitis C 04/01/2019   Alcohol use disorder, severe, dependence ([place]) 06/06/2018   Bipolar affective disorder, currently depressed, moderate ([place]) 06/06/2018   Moderate cocaine use disorder ([place]) 06/06/2018   PCP:  Patient, No Pcp Per (Inactive) Pharmacy:   [facility] [address] [place] [address] Phone: [phone] Fax: [phone]     Social Determinants of Health (SDOH) Interventions    Readmission Risk Interventions No flowsheet data found.

## 2020-12-14 NOTE — Progress Notes (Signed)
PROGRESS NOTE    Summer Cain  JTT:[id number] DOB: 10/28/1985 DOA: 12/04/2020 PCP: Patient, No Pcp Per (Inactive)   Brief Narrative:  Summer Cain is an 35 y.o. WF PMHx  IV drug abuse, opiate dependence hepatitis C with mitral valve vegetation on TTE July 2021, her cultures at that time were positive for Pseudomonas, MRSA and Serratia but unfortunately she left AMA comes into the ED for 1 week of left arm pain associated with swelling and redness she also has a lump on her chest wall and swollen lymph nodes.  She also relates scant hemoptysis and shortness of breath left upper extremity CT showed a DVT no abscess CT of the chest showed no PE or pneumonia started empirically on Vanco and Rocephin in the ED blood cultures were obtained showed strep pyogenes surveillance blood cultures negative.  2D echo showed multiple valve endocarditis, TEE was ordered, he is currently on IV penicillin and clindamycin CT of the maxillofacial showed no acute findings.   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   12/08/2020 TEE pending   Subjective: 11/27 Sleepy but arousable.  A/O x4.  No complaints   Assessment & Plan:  Covid vaccination; vaccinated 2/3.  Requests third vaccination.  11/23 requested bivalent booster  Principal Problem:   Acute deep vein thrombosis (DVT) of left upper extremity (HCC) Active Problems:   Moderate cocaine use disorder (HCC)   IVDU (intravenous drug user)   Hepatitis C   Sepsis (HCC)   Opioid use disorder   Thrombophlebitis   Hyponatremia   Pneumonia   Deep vein thrombosis (DVT) of left upper extremity, unspecified chronicity, unspecified vein (HCC)   Abscess of aortic root   Endocarditis of mitral valve   Endocarditis of tricuspid valve   Polysubstance abuse (HCC)   Streptococcal bacteremia   Acute deep vein thrombosis (DVT) of left upper extremity  (HCC): -Provoked likely due to IV drug abuse was started on heparin. -CT of the chest showedLong segment venous thrombus extending into the left axillary vein at the junction of the subclavian vein  -Currently on methadone and oxycodone -11/24 currently on Lovenox will need to have NCM review patient's insurance for lowest cost anticoagulant option   Sepsis/ left upper extremity cellulitis/positive strep pyogenes Bacteremia -Blood cultures 12/05/2020 positive strep pyogenes. Surveillance blood cultures from [id number] have been negative, we will go ahead and insert PICC line. -TEE scheduled for 12/10/2020.  Vegetations on valves see results below -CT surgery has been notified they recommended a TEE,  -Infectious disease recommended MRI of the cervical and lumbar spine which the patient refused to get, we have her scheduled for 12/09/2020 under anesthesia. -Continue IV penicillin -11/24 discussed case with Dr. Daiva Eves ID who is going to investigate into whether patient needs 4 or 6 weeks of IV antibiotics given her unusual bacteremia. -11/25 discussed case with Dr. Daiva Eves ID: Recommend a minimum of 4 weeks IV antibiotics, he will try to convince patient to stay for 6 weeks.   Cavitary lung lesion/pulmonary embolism: -Blood cultures were obtained, continue penicillin   Chest pain - 11/25 substernal/left lateral aspect of thoracic cavity reproducible with palpation. - 11/25 EKG normal - 11/25 troponin WNL  Hyponatremia probably hypovolemic: -Resolved with IV fluid hydration.  Hepatitis C: -Per patient never received treatment. -Will need follow-up as an outpatient.  Hepatitis B - Per ID note 11/23 request patient receive hepatitis B vaccination -11/24 ordered  hepatitis B vaccination  IV drug abuse/Polysubstance abuse - July 2021 positive for cocaine - 11/24 UDS negative -Currently on methadone and oxy  Hypocalcemia -11/25 corrected calcium= 9.9    DVT prophylaxis: Lovenox  treatment dose Code Status: Full Family Communication:  Status is: Inpatient    Dispo: The patient is from: Home              Anticipated d/c is to: SNF              Anticipated d/c date is: > 3 days              Patient currently is not medically stable to d/c.      Consultants:  ID Dr. Daiva Eves Cardiothoracic surgery Dr. Dorris Fetch Cardiology Dr. Shari Prows   Procedures/Significant Events:  11/23 MRI C-spine/T-spine MRI cervical spine:   1. No evidence of discitis/osteomyelitis or epidural abscess. 2. No significant disc herniation, spinal canal stenosis or neural foraminal narrowing.   MRI thoracic spine: 1. No evidence of discitis/osteomyelitis or epidural abscess. 2. 5 mm round T2 STIR hyperintense and T1 hypointense lesion within the left aspect of the T6 vertebral body with possible subtle peripheral enhancement. The imaging features are nonspecific and this lesion is indeterminate in etiology. Short-interval, 3-6 month MRI follow-up is recommended to ensure stability. 5. Bilateral pleural effusions (small-to-moderate right, small left). 11/23 TEE Left Ventricle: Left ventricular ejection fraction, by estimation, is 60  to 65%. The left ventricle has normal function. The left ventricular  internal cavity size was normal in size.  Mitral Valve: There are small mobile denisities visualized on the anterior  and posterior mitral valve leaflets (along A1/P1).  Tricuspid Valve: There is thickening of the tricuspid valve septal leaflet  with small fibrinous stranding visualized. Findings concerning for  vegetations    Pulmonary Artery: There is a mobile density visualized on the pulmonary  valve. While this may be related to artifact, cannot rule out vegetation.  There is trivial pulmonary regurgitation.    I have personally reviewed and interpreted all radiology studies and my findings are as above.  VENTILATOR  SETTINGS:    Cultures   Antimicrobials: Anti-infectives (From admission, onward)    Start     Ordered Stop   12/09/20 1000  linezolid (ZYVOX) tablet 600 mg  Status:  Discontinued        12/09/20 0914 12/09/20 1443   12/06/20 0800  cefTRIAXone (ROCEPHIN) 2 g in sodium chloride 0.9 % 100 mL IVPB  Status:  Discontinued        12/05/20 0621 12/05/20 0629   12/05/20 2200  penicillin G potassium 12 Million Units in dextrose 5 % 500 mL continuous infusion        12/05/20 1711     12/05/20 2000  vancomycin (VANCOCIN) IVPB 750 mg/150 ml premix  Status:  Discontinued        12/05/20 0635 12/05/20 1242   12/05/20 2000  vancomycin (VANCOCIN) 500 mg in sodium chloride 0.9 % 100 mL IVPB  Status:  Discontinued        12/05/20 1242 12/05/20 1711   12/05/20 1800  clindamycin (CLEOCIN) IVPB 600 mg  Status:  Discontinued        12/05/20 1712 12/09/20 0914   12/05/20 0800  ceFEPIme (MAXIPIME) 2 g in sodium chloride 0.9 % 100 mL IVPB  Status:  Discontinued        12/05/20 0630 12/05/20 1711   12/05/20 0100  vancomycin (VANCOCIN) IVPB 1000 mg/200 mL premix  12/05/20 0051 12/05/20 0734   12/05/20 0100  cefTRIAXone (ROCEPHIN) 2 g in sodium chloride 0.9 % 100 mL IVPB        12/05/20 0051 12/05/20 0535         Devices    LINES / TUBES:  RIGHT brachial PICC double-lumen 12/09/2020>>>>    Continuous Infusions:  penicillin g continuous IV infusion 12 Million Units (12/14/20 0150)     Objective: Vitals:   12/13/20 1621 12/13/20 2005 12/13/20 2348 12/14/20 0348  BP: (!) 125/53 108/73 (!) 99/58 (!) 103/44  Pulse: 93 72 70 73  Resp: Temp: 98.2 F (36.8 C) 98.1 F (36.7 C) 98 F (36.7 C) 98.6 F (37 C)  TempSrc: Oral Oral Oral Oral  SpO2: 100% 100% 100% 100%  Weight:    54.6 kg  Height:        Intake/Output Summary (Last 24 hours) at 12/14/2020 0729 Last data filed at 12/14/2020 0352 Gross per 24 hour  Intake 483 ml  Output 2400 ml  Net -1917 ml    Filed  Weights   12/12/20 0358 12/13/20 0310 12/14/20 0348  Weight: 53.9 kg 54.2 kg 54.6 kg    Examination:  General: A/O x4 No acute respiratory distress, cachectic Eyes: negative scleral hemorrhage, negative anisocoria, negative icterus ENT: Negative Runny nose, negative gingival bleeding, Neck:  Negative scars, masses, torticollis, lymphadenopathy, JVD Lungs: Clear to auscultation bilaterally without wheezes or crackles.   Cardiovascular: Regular rate and rhythm without murmur gallop or rub normal S1 and S2 Abdomen: negative abdominal pain, nondistended, positive soft, bowel sounds, no rebound, no ascites, no appreciable mass Extremities: No significant cyanosis, clubbing, or edema bilateral lower extremities Skin: Negative rashes, lesions, ulcers Psychiatric:  Negative depression, negative anxiety, negative fatigue, negative mania  Central nervous system:  Cranial nerves II through XII intact, tongue/uvula midline, all extremities muscle strength 5/5, sensation intact throughout, negative dysarthria, negative expressive aphasia, negative receptive aphasia.  .     Data Reviewed: Care during the described time interval was provided by me .  I have reviewed this patient's available data, including medical history, events of note, physical examination, and all test results as part of my evaluation.   CBC: Recent Labs  Lab 12/10/20 0500 12/11/20 0450 12/12/20 0500 12/13/20 0500 12/14/20 0155  WBC 7.8 12.0* 8.5 6.6 7.4  NEUTROABS  --  9.2* 4.2 2.9 3.2  HGB 10.9* 10.1* 10.6* 10.5* 11.3*  HCT 34.8* 32.3* 33.7* 33.7* 36.2  MCV 87.2 87.8 89.2 89.2 88.5  PLT 321 350 329 334 393    Basic Metabolic Panel: Recent Labs  Lab 12/10/20 1250 12/11/20 0450 12/12/20 0500 12/13/20 0500 12/14/20 0155  NA 136 138 137 136 135  K 4.0 4.5 4.0 4.1 4.5  CL 101 103 101 98 98  CO2 33* 31  GLUCOSE 137* 102* 87 95 99  BUN <5* CREATININE 0.66 0.77 0.79 0.84 0.81  CALCIUM 8.5*  8.4* 8.2* 8.5* 8.6*  MG 1.8 1.9 1.8 1.9 2.2  PHOS 3.4 4.0 5.1* 5.3* 5.3*    GFR: Estimated Creatinine Clearance: 76.6 mL/min (by C-G formula based on SCr of 0.81 mg/dL). Liver Function Tests: Recent Labs  Lab 12/10/20 1250 12/11/20 0450 12/12/20 0500 12/13/20 0500 12/14/20 0155  AST 35 20 29 40 36  ALT 36 41  ALKPHOS 64 61 64 84 98  BILITOT 0.3 0.4 0.3 0.4 0.2*  PROT 6.1* 5.4* 5.4* 5.5*  6.0*  ALBUMIN 2.0* 1.7* 1.8* 1.9* 2.2*    No results for input(s): LIPASE, AMYLASE in the last 168 hours. No results for input(s): AMMONIA in the last 168 hours. Coagulation Profile: No results for input(s): INR, PROTIME in the last 168 hours.  Cardiac Enzymes: No results for input(s): CKTOTAL, CKMB, CKMBINDEX, TROPONINI in the last 168 hours. BNP (last 3 results) No results for input(s): PROBNP in the last 8760 hours. HbA1C: No results for input(s): HGBA1C in the last 72 hours. CBG: No results for input(s): GLUCAP in the last 168 hours. Lipid Profile: No results for input(s): CHOL, HDL, LDLCALC, TRIG, CHOLHDL, LDLDIRECT in the last 72 hours. Thyroid Function Tests: No results for input(s): TSH, T4TOTAL, FREET4, T3FREE, THYROIDAB in the last 72 hours. Anemia Panel: No results for input(s): VITAMINB12, FOLATE, FERRITIN, TIBC, IRON, RETICCTPCT in the last 72 hours. Urine analysis:    Component Value Date/Time   COLORURINE AMBER (A) 12/05/2020 0029   APPEARANCEUR HAZY (A) 12/05/2020 0029   LABSPEC 1.028 12/05/2020 0029   PHURINE 6.0 12/05/2020 0029   GLUCOSEU NEGATIVE 12/05/2020 0029   HGBUR NEGATIVE 12/05/2020 0029   BILIRUBINUR NEGATIVE 12/05/2020 0029   KETONESUR NEGATIVE 12/05/2020 0029   PROTEINUR 30 (A) 12/05/2020 0029   NITRITE NEGATIVE 12/05/2020 0029   LEUKOCYTESUR NEGATIVE 12/05/2020 0029   Sepsis Labs: @LABRCNTIP (procalcitonin:4,lacticidven:4)  ) Recent Results (from the past 240 hour(s))  Resp Panel by RT-PCR (Flu A&B, Covid) Peripheral     Status: None    Collection Time: 12/04/20  8:52 PM   Specimen: Peripheral; Nasopharyngeal(NP) swabs in vial transport medium  Result Value Ref Range Status   SARS Coronavirus 2 by RT PCR NEGATIVE NEGATIVE Final    Comment: (NOTE) SARS-CoV-2 target nucleic acids are NOT DETECTED.  The SARS-CoV-2 RNA is generally detectable in upper respiratory specimens during the acute phase of infection. The lowest concentration of SARS-CoV-2 viral copies this assay can detect is 138 copies/mL. A negative result does not preclude SARS-Cov-2 infection and should not be used as the sole basis for treatment or other patient management decisions. A negative result may occur with  improper specimen collection/handling, submission of specimen other than nasopharyngeal swab, presence of viral mutation(s) within the areas targeted by this assay, and inadequate number of viral copies(<138 copies/mL). A negative result must be combined with clinical observations, patient history, and epidemiological information. The expected result is Negative.  Fact Sheet for Patients:  BloggerCourse.com  Fact Sheet for Healthcare Providers:  SeriousBroker.it  This test is no t yet approved or cleared by the [place] FDA and  has been authorized for detection and/or diagnosis of SARS-CoV-2 by FDA under an Emergency Use Authorization (EUA). This EUA will remain  in effect (meaning this test can be used) for the duration of the COVID-19 declaration under Section 564(b)(1) of the Act, 21 U.S.C.section 360bbb-3(b)(1), unless the authorization is terminated  or revoked sooner.       Influenza A by PCR NEGATIVE NEGATIVE Final   Influenza B by PCR NEGATIVE NEGATIVE Final    Comment: (NOTE) The Xpert Xpress SARS-CoV-2/FLU/RSV plus assay is intended as an aid in the diagnosis of influenza from Nasopharyngeal swab specimens and should not be used as a sole basis for treatment. Nasal  washings and aspirates are unacceptable for Xpert Xpress SARS-CoV-2/FLU/RSV testing.  Fact Sheet for Patients: BloggerCourse.com  Fact Sheet for Healthcare Providers: SeriousBroker.it  This test is not yet approved or cleared by the [place] and has been authorized for  detection and/or diagnosis of SARS-CoV-2 by FDA under an Emergency Use Authorization (EUA). This EUA will remain in effect (meaning this test can be used) for the duration of the COVID-19 declaration under Section 564(b)(1) of the Act, 21 U.S.C. section 360bbb-3(b)(1), unless the authorization is terminated or revoked.  Performed at [facility], [address] [place], [place] [address]   Blood Culture (routine x 2)     Status: Abnormal   Collection Time: 12/04/20  9:08 PM   Specimen: BLOOD RIGHT HAND  Result Value Ref Range Status   Specimen Description BLOOD RIGHT HAND  Final   Special Requests   Final    BOTTLES DRAWN AEROBIC AND ANAEROBIC Blood Culture adequate volume   Culture  Setup Time   Final    GRAM POSITIVE COCCI IN CHAINS IN PAIRS AEROBIC BOTTLE ONLY Organism ID to follow CRITICAL RESULT CALLED TO, READ BACK BY AND VERIFIED WITHCandiss Norse [facility] PHARMD [id number] 12/05/20 A BROWNING    Culture (A)  Final    GROUP A STREP (S.PYOGENES) ISOLATED HEALTH DEPARTMENT NOTIFIED Performed at [facility], [address], [place], [place] [address]    Report Status 12/08/2020 FINAL  Final   Organism ID, Bacteria GROUP A STREP (S.PYOGENES) ISOLATED  Final      Susceptibility   Group a strep (s.pyogenes) isolated - MIC*    PENICILLIN <=0.06 SENSITIVE Sensitive     CEFTRIAXONE <=0.12 SENSITIVE Sensitive     ERYTHROMYCIN >=8 RESISTANT Resistant     LEVOFLOXACIN <=0.25 SENSITIVE Sensitive     VANCOMYCIN 0.25 SENSITIVE Sensitive     * GROUP A STREP (S.PYOGENES) ISOLATED  Blood Culture (routine x 2)     Status: Abnormal   Collection Time:  12/04/20  9:08 PM   Specimen: BLOOD RIGHT ARM  Result Value Ref Range Status   Specimen Description BLOOD RIGHT ARM  Final   Special Requests   Final    BOTTLES DRAWN AEROBIC ONLY Blood Culture adequate volume   Culture  Setup Time   Final    GRAM POSITIVE COCCI IN CHAINS AEROBIC BOTTLE ONLY CRITICAL VALUE NOTED.  VALUE IS CONSISTENT WITH PREVIOUSLY REPORTED AND CALLED VALUE.    Culture (A)  Final    GROUP A STREP (S.PYOGENES) ISOLATED SUSCEPTIBILITIES PERFORMED ON PREVIOUS CULTURE WITHIN THE LAST 5 DAYS. HEALTH DEPARTMENT NOTIFIED Performed at [facility], [address] [place], [place] [address]    Report Status 12/08/2020 FINAL  Final  Blood Culture ID Panel (Reflexed)     Status: Abnormal   Collection Time: 12/04/20  9:08 PM  Result Value Ref Range Status   Enterococcus faecalis NOT DETECTED NOT DETECTED Final   Enterococcus Faecium NOT DETECTED NOT DETECTED Final   Listeria monocytogenes NOT DETECTED NOT DETECTED Final   Staphylococcus species NOT DETECTED NOT DETECTED Final   Staphylococcus aureus (BCID) NOT DETECTED NOT DETECTED Final   Staphylococcus epidermidis NOT DETECTED NOT DETECTED Final   Staphylococcus lugdunensis NOT DETECTED NOT DETECTED Final   Streptococcus species DETECTED (A) NOT DETECTED Final    Comment: CRITICAL RESULT CALLED TO, READ BACK BY AND VERIFIED WITHCandiss Norse [facility] PHARMD [id number] 12/05/20 A BROWNING    Streptococcus agalactiae NOT DETECTED NOT DETECTED Final   Streptococcus pneumoniae NOT DETECTED NOT DETECTED Final   Streptococcus pyogenes DETECTED (A) NOT DETECTED Final    Comment: CRITICAL RESULT CALLED TO, READ BACK BY AND VERIFIED WITHCandiss Norse [facility] PHARMD [id number] 12/05/20 A BROWNING  A.calcoaceticus-baumannii NOT DETECTED NOT DETECTED Final   Bacteroides fragilis NOT DETECTED NOT DETECTED Final   Enterobacterales NOT DETECTED NOT DETECTED Final   Enterobacter cloacae complex NOT DETECTED NOT DETECTED Final   Escherichia coli NOT  DETECTED NOT DETECTED Final   Klebsiella aerogenes NOT DETECTED NOT DETECTED Final   Klebsiella oxytoca NOT DETECTED NOT DETECTED Final   Klebsiella pneumoniae NOT DETECTED NOT DETECTED Final   Proteus species NOT DETECTED NOT DETECTED Final   Salmonella species NOT DETECTED NOT DETECTED Final   Serratia marcescens NOT DETECTED NOT DETECTED Final   Haemophilus influenzae NOT DETECTED NOT DETECTED Final   Neisseria meningitidis NOT DETECTED NOT DETECTED Final   Pseudomonas aeruginosa NOT DETECTED NOT DETECTED Final   Stenotrophomonas maltophilia NOT DETECTED NOT DETECTED Final   Candida albicans NOT DETECTED NOT DETECTED Final   Candida auris NOT DETECTED NOT DETECTED Final   Candida glabrata NOT DETECTED NOT DETECTED Final   Candida krusei NOT DETECTED NOT DETECTED Final   Candida parapsilosis NOT DETECTED NOT DETECTED Final   Candida tropicalis NOT DETECTED NOT DETECTED Final   Cryptococcus neoformans/gattii NOT DETECTED NOT DETECTED Final    Comment: Performed at [facility], [address], [place], [place] [address]  Culture, blood (single)     Status: None   Collection Time: 12/05/20  1:30 AM   Specimen: BLOOD  Result Value Ref Range Status   Specimen Description BLOOD SITE NOT SPECIFIED  Final   Special Requests   Final    BOTTLES DRAWN AEROBIC AND ANAEROBIC Blood Culture adequate volume   Culture   Final    NO GROWTH 5 DAYS Performed at [facility], [address], [place], [place] [address]    Report Status 12/10/2020 FINAL  Final  Culture, blood (Routine X 2) w Reflex to ID Panel     Status: None   Collection Time: 12/07/20  1:02 AM   Specimen: BLOOD  Result Value Ref Range Status   Specimen Description BLOOD RIGHT HAND  Final   Special Requests   Final    BOTTLES DRAWN AEROBIC ONLY Blood Culture results may not be optimal due to an inadequate volume of blood received in culture bottles   Culture   Final    NO GROWTH 5 DAYS Performed at [facility], [address] [place], [place] [address]    Report Status 12/12/2020 FINAL  Final  Culture, blood (Routine X 2) w Reflex to ID Panel     Status: None   Collection Time: 12/07/20 10:22 AM   Specimen: BLOOD RIGHT HAND  Result Value Ref Range Status   Specimen Description BLOOD RIGHT HAND  Final   Special Requests   Final    BOTTLES DRAWN AEROBIC ONLY Blood Culture results may not be optimal due to an inadequate volume of blood received in culture bottles   Culture   Final    NO GROWTH 5 DAYS Performed at [facility], [address], [place], [place] [address]    Report Status 12/12/2020 FINAL  Final  Surgical pcr screen     Status: Abnormal   Collection Time: 12/10/20  1:00 PM   Specimen: Nasal Mucosa; Nasal Swab  Result Value Ref Range Status   MRSA, PCR POSITIVE (A) NEGATIVE Final    Comment: RESULT CALLED TO, READ BACK BY AND VERIFIED WITH: Louie Boston RN [id number] 12/10/20 A BROWNING    Staphylococcus aureus POSITIVE (A) NEGATIVE Final  Comment: (NOTE) The Xpert SA Assay (FDA approved for NASAL specimens in patients 69 years of age and older), is one component of a comprehensive surveillance program. It is not intended to diagnose infection nor to guide or monitor treatment. Performed at [facility], [address], [place], [place] [address]          Radiology Studies: DG CHEST PORT 1 VIEW  Result Date: 12/12/2020 CLINICAL DATA:  Chest pain EXAM: PORTABLE CHEST 1 VIEW COMPARISON:  12/05/2020 chest radiograph. FINDINGS: Right PICC terminates at the cavoatrial junction. Stable cardiomediastinal silhouette with normal heart size. No pneumothorax. No pleural effusion. Lungs appear clear, with no acute consolidative airspace disease and no pulmonary edema. IMPRESSION: No active disease. Electronically Signed   By: Delbert Phenix M.D.   On: 12/12/2020 11:19        Scheduled Meds:  apixaban  5 mg Oral BID   Chlorhexidine Gluconate Cloth  6 each Topical  Daily   COVID-19 mRNA bivalent vaccine (Pfizer)  0.3 mL Intramuscular Once   methadone  20 mg Oral Daily   mupirocin ointment  1 application Nasal BID   pantoprazole  40 mg Oral BID   sodium chloride flush  10-40 mL Intracatheter Q12H   Continuous Infusions:  penicillin g continuous IV infusion 12 Million Units (12/14/20 0150)     LOS: 9 days   The patient is critically ill with multiple organ systems failure and requires high complexity decision making for assessment and support, frequent evaluation and titration of therapies, application of advanced monitoring technologies and extensive interpretation of multiple databases. Critical Care Time devoted to patient care services described in this note  Time spent: 40 minutes     Elnore Cosens, Roselind Messier, MD [facility]   If 7PM-7AM, please contact night-coverage 12/14/2020, 7:29 AM

## 2020-12-15 DIAGNOSIS — E871 Hypo-osmolality and hyponatremia: Secondary | ICD-10-CM | POA: Diagnosis not present

## 2020-12-15 DIAGNOSIS — I82A12 Acute embolism and thrombosis of left axillary vein: Secondary | ICD-10-CM | POA: Diagnosis not present

## 2020-12-15 DIAGNOSIS — F1990 Other psychoactive substance use, unspecified, uncomplicated: Secondary | ICD-10-CM | POA: Diagnosis not present

## 2020-12-15 DIAGNOSIS — I82622 Acute embolism and thrombosis of deep veins of left upper extremity: Secondary | ICD-10-CM | POA: Diagnosis not present

## 2020-12-15 LAB — CBC WITH DIFFERENTIAL/PLATELET
Abs Immature Granulocytes: 0.18 10*3/uL — ABNORMAL HIGH (ref 0.00–0.07)
Basophils Absolute: 0 10*3/uL (ref 0.0–0.1)
Basophils Relative: 1 %
Eosinophils Absolute: 0.1 10*3/uL (ref 0.0–0.5)
Eosinophils Relative: 1 %
HCT: 36.3 % (ref 36.0–46.0)
Hemoglobin: 11.4 g/dL — ABNORMAL LOW (ref 12.0–15.0)
Immature Granulocytes: 3 %
Lymphocytes Relative: 48 %
Lymphs Abs: 3.1 10*3/uL (ref 0.7–4.0)
MCH: 27.9 pg (ref 26.0–34.0)
MCHC: 31.4 g/dL (ref 30.0–36.0)
MCV: 88.8 fL (ref 80.0–100.0)
Monocytes Absolute: 0.4 10*3/uL (ref 0.1–1.0)
Monocytes Relative: 6 %
Neutro Abs: 2.7 10*3/uL (ref 1.7–7.7)
Neutrophils Relative %: 41 %
Platelets: 392 10*3/uL (ref 150–400)
RBC: 4.09 MIL/uL (ref 3.87–5.11)
RDW: 15.5 % (ref 11.5–15.5)
WBC: 6.4 10*3/uL (ref 4.0–10.5)
nRBC: 0 % (ref 0.0–0.2)

## 2020-12-15 LAB — COMPREHENSIVE METABOLIC PANEL
ALT: 37 U/L (ref 0–44)
AST: 26 U/L (ref 15–41)
Albumin: 2.4 g/dL — ABNORMAL LOW (ref 3.5–5.0)
Alkaline Phosphatase: 99 U/L (ref 38–126)
Anion gap: 6 (ref 5–15)
BUN: 17 mg/dL (ref 6–20)
CO2: 31 mmol/L (ref 22–32)
Calcium: 8.8 mg/dL — ABNORMAL LOW (ref 8.9–10.3)
Chloride: 99 mmol/L (ref 98–111)
Creatinine, Ser: 0.94 mg/dL (ref 0.44–1.00)
GFR, Estimated: >60 mL/min (ref >60)
Glucose, Bld: 84 mg/dL (ref 70–99)
Potassium: 4.7 mmol/L (ref 3.5–5.1)
Sodium: 136 mmol/L (ref 135–145)
Total Bilirubin: 0.5 mg/dL (ref 0.3–1.2)
Total Protein: 6.4 g/dL — ABNORMAL LOW (ref 6.5–8.1)

## 2020-12-15 LAB — MAGNESIUM: Magnesium: 2 mg/dL (ref 1.7–2.4)

## 2020-12-15 LAB — PHOSPHORUS: Phosphorus: 4.6 mg/dL (ref 2.5–4.6)

## 2020-12-15 MED ORDER — DIVALPROEX SODIUM 125 MG PO CSDR
250.0000 mg | DELAYED_RELEASE_CAPSULE | Freq: Two times a day (BID) | ORAL | Status: DC
  Administered 2020-12-15 – 2020-12-26 (×19): 250 mg via ORAL
  Filled 2020-12-15 (×25): qty 2

## 2020-12-15 MED ORDER — TRAZODONE HCL 50 MG PO TABS
50.0000 mg | ORAL_TABLET | Freq: Every day | ORAL | Status: DC
  Administered 2020-12-15 – 2020-12-16 (×2): 50 mg via ORAL
  Filled 2020-12-15 (×2): qty 1

## 2020-12-15 NOTE — Progress Notes (Signed)
PROGRESS NOTE    Summer Cain  ZOX:[id number] DOB: 13-Jan-1986 DOA: 12/04/2020 PCP: Patient, No Pcp Per (Inactive)   Brief Narrative:  Summer Cain is an 35 y.o. WF PMHx  IV drug abuse, opiate dependence hepatitis C with mitral valve vegetation on TTE July 2021, her cultures at that time were positive for Pseudomonas, MRSA and Serratia but unfortunately she left AMA comes into the ED for 1 week of left arm pain associated with swelling and redness she also has a lump on her chest wall and swollen lymph nodes.  She also relates scant hemoptysis and shortness of breath left upper extremity CT showed a DVT no abscess CT of the chest showed no PE or pneumonia started empirically on Vanco and Rocephin in the ED blood cultures were obtained showed strep pyogenes surveillance blood cultures negative.  2D echo showed multiple valve endocarditis, TEE was ordered, he is currently on IV penicillin and clindamycin CT of the maxillofacial showed no acute findings.   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   12/08/2020 TEE pending   Subjective: 11/28 A/O x4.  Upset about delivery of her breakfast.   Assessment & Plan:  Covid vaccination; vaccinated 2/3.  Requests third vaccination.  11/23 requested bivalent booster  Principal Problem:   Acute deep vein thrombosis (DVT) of left upper extremity (HCC) Active Problems:   Moderate cocaine use disorder (HCC)   IVDU (intravenous drug user)   Hepatitis C   Sepsis (HCC)   Opioid use disorder   Thrombophlebitis   Hyponatremia   Pneumonia   Deep vein thrombosis (DVT) of left upper extremity, unspecified chronicity, unspecified vein (HCC)   Abscess of aortic root   Endocarditis of mitral valve   Endocarditis of tricuspid valve   Polysubstance abuse (HCC)   Streptococcal bacteremia   Acute deep vein thrombosis (DVT) of left upper extremity  (HCC): -Provoked likely due to IV drug abuse was started on heparin. -CT of the chest showedLong segment venous thrombus extending into the left axillary vein at the junction of the subclavian vein  -Currently on methadone and oxycodone -11/24 currently on Lovenox will need to have NCM review patient's insurance for lowest cost anticoagulant option   Sepsis/ left upper extremity cellulitis/positive strep pyogenes Bacteremia -Blood cultures 12/05/2020 positive strep pyogenes. Surveillance blood cultures from [id number] have been negative, we will go ahead and insert PICC line. -TEE scheduled for 12/10/2020.  Vegetations on valves see results below -CT surgery has been notified they recommended a TEE,  -Infectious disease recommended MRI of the cervical and lumbar spine which the patient refused to get, we have her scheduled for 12/09/2020 under anesthesia. -Continue IV penicillin -11/24 discussed case with Dr. Daiva Eves ID who is going to investigate into whether patient needs 4 or 6 weeks of IV antibiotics given her unusual bacteremia. -11/25 discussed case with Dr. Daiva Eves ID: Recommend a minimum of 4 weeks IV antibiotics, he will try to convince patient to stay for 6 weeks.   Cavitary lung lesion/pulmonary embolism: -Blood cultures were obtained, continue penicillin   Chest pain - 11/25 substernal/left lateral aspect of thoracic cavity reproducible with palpation. - 11/25 EKG normal - 11/25 troponin WNL  Hyponatremia probably hypovolemic: -Resolved with IV fluid hydration.  Hepatitis C: -Per patient never received treatment. -Will need follow-up as an outpatient.  Hepatitis B - Per ID note 11/23 request patient receive hepatitis B vaccination -11/24 ordered  hepatitis B vaccination  IV drug abuse/Polysubstance abuse - July 2021 positive for cocaine - 11/24 UDS negative -Currently on methadone and oxy  Hypocalcemia -11/25 corrected calcium= 9.9    DVT prophylaxis: Lovenox  treatment dose Code Status: Full Family Communication:  Status is: Inpatient    Dispo: The patient is from: Home              Anticipated d/c is to: SNF              Anticipated d/c date is: > 3 days              Patient currently is not medically stable to d/c.      Consultants:  ID Dr. Daiva Eves Cardiothoracic surgery Dr. Dorris Fetch Cardiology Dr. Shari Prows   Procedures/Significant Events:  11/23 MRI C-spine/T-spine MRI cervical spine:   1. No evidence of discitis/osteomyelitis or epidural abscess. 2. No significant disc herniation, spinal canal stenosis or neural foraminal narrowing.   MRI thoracic spine: 1. No evidence of discitis/osteomyelitis or epidural abscess. 2. 5 mm round T2 STIR hyperintense and T1 hypointense lesion within the left aspect of the T6 vertebral body with possible subtle peripheral enhancement. The imaging features are nonspecific and this lesion is indeterminate in etiology. Short-interval, 3-6 month MRI follow-up is recommended to ensure stability. 5. Bilateral pleural effusions (small-to-moderate right, small left). 11/23 TEE Left Ventricle: Left ventricular ejection fraction, by estimation, is 60  to 65%. The left ventricle has normal function. The left ventricular  internal cavity size was normal in size.  Mitral Valve: There are small mobile denisities visualized on the anterior  and posterior mitral valve leaflets (along A1/P1).  Tricuspid Valve: There is thickening of the tricuspid valve septal leaflet  with small fibrinous stranding visualized. Findings concerning for  vegetations    Pulmonary Artery: There is a mobile density visualized on the pulmonary  valve. While this may be related to artifact, cannot rule out vegetation.  There is trivial pulmonary regurgitation.    I have personally reviewed and interpreted all radiology studies and my findings are as above.  VENTILATOR  SETTINGS:    Cultures   Antimicrobials: Anti-infectives (From admission, onward)    Start     Ordered Stop   12/09/20 1000  linezolid (ZYVOX) tablet 600 mg  Status:  Discontinued        12/09/20 0914 12/09/20 1443   12/06/20 0800  cefTRIAXone (ROCEPHIN) 2 g in sodium chloride 0.9 % 100 mL IVPB  Status:  Discontinued        12/05/20 0621 12/05/20 0629   12/05/20 2200  penicillin G potassium 12 Million Units in dextrose 5 % 500 mL continuous infusion        12/05/20 1711     12/05/20 2000  vancomycin (VANCOCIN) IVPB 750 mg/150 ml premix  Status:  Discontinued        12/05/20 0635 12/05/20 1242   12/05/20 2000  vancomycin (VANCOCIN) 500 mg in sodium chloride 0.9 % 100 mL IVPB  Status:  Discontinued        12/05/20 1242 12/05/20 1711   12/05/20 1800  clindamycin (CLEOCIN) IVPB 600 mg  Status:  Discontinued        12/05/20 1712 12/09/20 0914   12/05/20 0800  ceFEPIme (MAXIPIME) 2 g in sodium chloride 0.9 % 100 mL IVPB  Status:  Discontinued        12/05/20 0630 12/05/20 1711   12/05/20 0100  vancomycin (VANCOCIN) IVPB 1000 mg/200 mL premix  12/05/20 0051 12/05/20 0734   12/05/20 0100  cefTRIAXone (ROCEPHIN) 2 g in sodium chloride 0.9 % 100 mL IVPB        12/05/20 0051 12/05/20 0535         Devices    LINES / TUBES:  RIGHT brachial PICC double-lumen 12/09/2020>>>>    Continuous Infusions:  penicillin g continuous IV infusion 12 Million Units (12/15/20 0152)     Objective: Vitals:   12/14/20 2018 12/14/20 2328 12/15/20 0334 12/15/20 0838  BP: (!) 108/52 (!) 103/52 (!) 121/51 (!) 97/42  Pulse: 75 70 65 62  Resp: 17 16 12 14   Temp: 97.7 F (36.5 C) 97.7 F (36.5 C) 97.7 F (36.5 C) 97.9 F (36.6 C)  TempSrc: Oral Oral Oral Oral  SpO2: 100% 100% 100% 98%  Weight:   55.3 kg   Height:        Intake/Output Summary (Last 24 hours) at 12/15/2020 1057 Last data filed at 12/15/2020 12/17/2020 Gross per 24 hour  Intake 813.6 ml  Output 2250 ml  Net -1436.4 ml     Filed Weights   12/13/20 0310 12/14/20 0348 12/15/20 0334  Weight: 54.2 kg 54.6 kg 55.3 kg    Examination:  General: A/O x4 No acute respiratory distress, cachectic Eyes: negative scleral hemorrhage, negative anisocoria, negative icterus ENT: Negative Runny nose, negative gingival bleeding, Neck:  Negative scars, masses, torticollis, lymphadenopathy, JVD Lungs: Clear to auscultation bilaterally without wheezes or crackles.   Cardiovascular: Regular rate and rhythm without murmur gallop or rub normal S1 and S2 Abdomen: negative abdominal pain, nondistended, positive soft, bowel sounds, no rebound, no ascites, no appreciable mass Extremities: No significant cyanosis, clubbing, or edema bilateral lower extremities Skin: Negative rashes, lesions, ulcers Psychiatric:  Negative depression, negative anxiety, negative fatigue, negative mania  Central nervous system:  Cranial nerves II through XII intact, tongue/uvula midline, all extremities muscle strength 5/5, sensation intact throughout, negative dysarthria, negative expressive aphasia, negative receptive aphasia.  .     Data Reviewed: Care during the described time interval was provided by me .  I have reviewed this patient's available data, including medical history, events of note, physical examination, and all test results as part of my evaluation.   CBC: Recent Labs  Lab 12/11/20 0450 12/12/20 0500 12/13/20 0500 12/14/20 0155 12/15/20 0500  WBC 12.0* 8.5 6.6 7.4 6.4  NEUTROABS 9.2* 4.2 2.9 3.2 2.7  HGB 10.1* 10.6* 10.5* 11.3* 11.4*  HCT 32.3* 33.7* 33.7* 36.2 36.3  MCV 87.8 89.2 89.2 88.5 88.8  PLT 350 329 334 393 392    Basic Metabolic Panel: Recent Labs  Lab 12/11/20 0450 12/12/20 0500 12/13/20 0500 12/14/20 0155 12/15/20 0500  NA 138 137 136 135 136  K 4.5 4.0 4.1 4.5 4.7  CL 103 101 98 98 99  CO2 31 31 33* 31 31  GLUCOSE 102* 87 95 99 84  BUN 8 12 15 14 17   CREATININE 0.77 0.79 0.84 0.81 0.94  CALCIUM  8.4* 8.2* 8.5* 8.6* 8.8*  MG 1.9 1.8 1.9 2.2 2.0  PHOS 4.0 5.1* 5.3* 5.3* 4.6    GFR: Estimated Creatinine Clearance: 66.4 mL/min (by C-G formula based on SCr of 0.94 mg/dL). Liver Function Tests: Recent Labs  Lab 12/11/20 0450 12/12/20 0500 12/13/20 0500 12/14/20 0155 12/15/20 0500  AST 20 29 40 36 26  ALT 20 26 36 41 37  ALKPHOS 61 64 84 98 99  BILITOT 0.4 0.3 0.4 0.2* 0.5  PROT 5.4* 5.4* 5.5* 6.0* 6.4*  ALBUMIN 1.7* 1.8* 1.9* 2.2* 2.4*    No results for input(s): LIPASE, AMYLASE in the last 168 hours. No results for input(s): AMMONIA in the last 168 hours. Coagulation Profile: No results for input(s): INR, PROTIME in the last 168 hours.  Cardiac Enzymes: No results for input(s): CKTOTAL, CKMB, CKMBINDEX, TROPONINI in the last 168 hours. BNP (last 3 results) No results for input(s): PROBNP in the last 8760 hours. HbA1C: No results for input(s): HGBA1C in the last 72 hours. CBG: No results for input(s): GLUCAP in the last 168 hours. Lipid Profile: No results for input(s): CHOL, HDL, LDLCALC, TRIG, CHOLHDL, LDLDIRECT in the last 72 hours. Thyroid Function Tests: No results for input(s): TSH, T4TOTAL, FREET4, T3FREE, THYROIDAB in the last 72 hours. Anemia Panel: No results for input(s): VITAMINB12, FOLATE, FERRITIN, TIBC, IRON, RETICCTPCT in the last 72 hours. Urine analysis:    Component Value Date/Time   COLORURINE AMBER (A) 12/05/2020 0029   APPEARANCEUR HAZY (A) 12/05/2020 0029   LABSPEC 1.028 12/05/2020 0029   PHURINE 6.0 12/05/2020 0029   GLUCOSEU NEGATIVE 12/05/2020 0029   HGBUR NEGATIVE 12/05/2020 0029   BILIRUBINUR NEGATIVE 12/05/2020 0029   KETONESUR NEGATIVE 12/05/2020 0029   PROTEINUR 30 (A) 12/05/2020 0029   NITRITE NEGATIVE 12/05/2020 0029   LEUKOCYTESUR NEGATIVE 12/05/2020 0029   Sepsis Labs: @LABRCNTIP (procalcitonin:4,lacticidven:4)  ) Recent Results (from the past 240 hour(s))  Culture, blood (Routine X 2) w Reflex to ID Panel     Status:  None   Collection Time: 12/07/20  1:02 AM   Specimen: BLOOD  Result Value Ref Range Status   Specimen Description BLOOD RIGHT HAND  Final   Special Requests   Final    BOTTLES DRAWN AEROBIC ONLY Blood Culture results may not be optimal due to an inadequate volume of blood received in culture bottles   Culture   Final    NO GROWTH 5 DAYS Performed at [facility], [address], [place], [place]    Report Status 12/12/2020 FINAL  Final  Culture, blood (Routine X 2) w Reflex to ID Panel     Status: None   Collection Time: 12/07/20 10:22 AM   Specimen: BLOOD RIGHT HAND  Result Value Ref Range Status   Specimen Description BLOOD RIGHT HAND  Final   Special Requests   Final    BOTTLES DRAWN AEROBIC ONLY Blood Culture results may not be optimal due to an inadequate volume of blood received in culture bottles   Culture   Final    NO GROWTH 5 DAYS Performed at [facility], [address], [place], [place]    Report Status 12/12/2020 FINAL  Final  Surgical pcr screen     Status: Abnormal   Collection Time: 12/10/20  1:00 PM   Specimen: Nasal Mucosa; Nasal Swab  Result Value Ref Range Status   MRSA, PCR POSITIVE (A) NEGATIVE Final    Comment: RESULT CALLED TO, READ BACK BY AND VERIFIED WITH: 12/12/20 RN Louie Boston 12/10/20 A BROWNING    Staphylococcus aureus POSITIVE (A) NEGATIVE Final    Comment: (NOTE) The Xpert SA Assay (FDA approved for NASAL specimens in patients 76 years of age and older), is one component of a comprehensive surveillance program. It is not intended to diagnose infection nor to guide or monitor treatment. Performed at [facility], [address], [place], [place]          Radiology Studies: No results found.  Scheduled Meds:  apixaban  5 mg Oral BID   Chlorhexidine Gluconate Cloth  6 each Topical Daily   COVID-19 mRNA bivalent vaccine ([employer])  0.3 mL Intramuscular Once   methadone  20 mg Oral Daily    pantoprazole  40 mg Oral BID   sodium chloride flush  10-40 mL Intracatheter Q12H   Continuous Infusions:  penicillin g continuous IV infusion 12 Million Units (12/15/20 0152)     LOS: 10 days   The patient is critically ill with multiple organ systems failure and requires high complexity decision making for assessment and support, frequent evaluation and titration of therapies, application of advanced monitoring technologies and extensive interpretation of multiple databases. Critical Care Time devoted to patient care services described in this note  Time spent: 40 minutes     Milany Geck, Roselind Messier, MD [facility]   If 7PM-7AM, please contact night-coverage 12/15/2020, 10:57 AM

## 2020-12-15 NOTE — Discharge Instructions (Signed)

## 2020-12-16 DIAGNOSIS — E871 Hypo-osmolality and hyponatremia: Secondary | ICD-10-CM | POA: Diagnosis not present

## 2020-12-16 DIAGNOSIS — I82A12 Acute embolism and thrombosis of left axillary vein: Secondary | ICD-10-CM | POA: Diagnosis not present

## 2020-12-16 DIAGNOSIS — I82622 Acute embolism and thrombosis of deep veins of left upper extremity: Secondary | ICD-10-CM | POA: Diagnosis not present

## 2020-12-16 DIAGNOSIS — F1990 Other psychoactive substance use, unspecified, uncomplicated: Secondary | ICD-10-CM | POA: Diagnosis not present

## 2020-12-16 LAB — CBC WITH DIFFERENTIAL/PLATELET
Abs Immature Granulocytes: 0.06 10*3/uL (ref 0.00–0.07)
Basophils Absolute: 0 10*3/uL (ref 0.0–0.1)
Basophils Relative: 1 %
Eosinophils Absolute: 0.1 10*3/uL (ref 0.0–0.5)
Eosinophils Relative: 1 %
HCT: 32.4 % — ABNORMAL LOW (ref 36.0–46.0)
Hemoglobin: 10 g/dL — ABNORMAL LOW (ref 12.0–15.0)
Immature Granulocytes: 1 %
Lymphocytes Relative: 50 %
Lymphs Abs: 2.5 10*3/uL (ref 0.7–4.0)
MCH: 27.4 pg (ref 26.0–34.0)
MCHC: 30.9 g/dL (ref 30.0–36.0)
MCV: 88.8 fL (ref 80.0–100.0)
Monocytes Absolute: 0.3 10*3/uL (ref 0.1–1.0)
Monocytes Relative: 7 %
Neutro Abs: 2 10*3/uL (ref 1.7–7.7)
Neutrophils Relative %: 40 %
Platelets: 351 10*3/uL (ref 150–400)
RBC: 3.65 MIL/uL — ABNORMAL LOW (ref 3.87–5.11)
RDW: 15.5 % (ref 11.5–15.5)
WBC: 4.9 10*3/uL (ref 4.0–10.5)
nRBC: 0 % (ref 0.0–0.2)

## 2020-12-16 LAB — COMPREHENSIVE METABOLIC PANEL
ALT: 28 U/L (ref 0–44)
AST: 21 U/L (ref 15–41)
Albumin: 2.1 g/dL — ABNORMAL LOW (ref 3.5–5.0)
Alkaline Phosphatase: 91 U/L (ref 38–126)
Anion gap: 5 (ref 5–15)
BUN: 14 mg/dL (ref 6–20)
CO2: 30 mmol/L (ref 22–32)
Calcium: 8.3 mg/dL — ABNORMAL LOW (ref 8.9–10.3)
Chloride: 97 mmol/L — ABNORMAL LOW (ref 98–111)
Creatinine, Ser: 0.81 mg/dL (ref 0.44–1.00)
GFR, Estimated: >60 mL/min (ref >60)
Glucose, Bld: 285 mg/dL — ABNORMAL HIGH (ref 70–99)
Potassium: 6.2 mmol/L — ABNORMAL HIGH (ref 3.5–5.1)
Sodium: 132 mmol/L — ABNORMAL LOW (ref 135–145)
Total Bilirubin: 0.4 mg/dL (ref 0.3–1.2)
Total Protein: 5.5 g/dL — ABNORMAL LOW (ref 6.5–8.1)

## 2020-12-16 LAB — MAGNESIUM
Magnesium: 1.9 mg/dL (ref 1.7–2.4)
Magnesium: 2 mg/dL (ref 1.7–2.4)

## 2020-12-16 LAB — POTASSIUM: Potassium: 3.9 mmol/L (ref 3.5–5.1)

## 2020-12-16 LAB — PHOSPHORUS: Phosphorus: 4 mg/dL (ref 2.5–4.6)

## 2020-12-16 NOTE — Progress Notes (Signed)
PROGRESS NOTE    Summer Cain  ZOX:[id number] DOB: 03/11/1985 DOA: 12/04/2020 PCP: Patient, No Pcp Per (Inactive)   Brief Narrative:  Summer Cain is an 35 y.o. WF PMHx  IV drug abuse, opiate dependence hepatitis C with mitral valve vegetation on TTE July 2021, her cultures at that time were positive for Pseudomonas, MRSA and Serratia but unfortunately she left AMA comes into the ED for 1 week of left arm pain associated with swelling and redness she also has a lump on her chest wall and swollen lymph nodes.  She also relates scant hemoptysis and shortness of breath left upper extremity CT showed a DVT no abscess CT of the chest showed no PE or pneumonia started empirically on Vanco and Rocephin in the ED blood cultures were obtained showed strep pyogenes surveillance blood cultures negative.  2D echo showed multiple valve endocarditis, TEE was ordered, he is currently on IV penicillin and clindamycin CT of the maxillofacial showed no acute findings.   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    Subjective: 11/29 afebrile overnight, A/O x4, no complaints.   Assessment & Plan:  Covid vaccination; vaccinated 2/3.  Requests third vaccination.  11/23 requested bivalent booster  Principal Problem:   Acute deep vein thrombosis (DVT) of left upper extremity (HCC) Active Problems:   Moderate cocaine use disorder (HCC)   IVDU (intravenous drug user)   Hepatitis C   Sepsis (HCC)   Opioid use disorder   Thrombophlebitis   Hyponatremia   Pneumonia   Deep vein thrombosis (DVT) of left upper extremity, unspecified chronicity, unspecified vein (HCC)   Abscess of aortic root   Endocarditis of mitral valve   Endocarditis of tricuspid valve   Polysubstance abuse (HCC)   Streptococcal bacteremia   Acute deep vein thrombosis (DVT) of left upper extremity (HCC): -Provoked likely due to IV drug  abuse was started on heparin. -CT of the chest showedLong segment venous thrombus extending into the left axillary vein at the junction of the subclavian vein  -Currently on methadone and oxycodone -11/24 currently on Lovenox will need to have NCM review patient's insurance for lowest cost anticoagulant option   Sepsis/ left upper extremity cellulitis/positive strep pyogenes Bacteremia -Blood cultures 12/05/2020 positive strep pyogenes. Surveillance blood cultures from [id number] have been negative, we will go ahead and insert PICC line. -TEE scheduled for 12/10/2020.  Vegetations on valves see results below -CT surgery has been notified they recommended a TEE,  -Infectious disease recommended MRI of the cervical and lumbar spine which the patient refused to get, we have her scheduled for 12/09/2020 under anesthesia. -Continue IV penicillin -11/24 discussed case with Dr. Daiva Eves ID who is going to investigate into whether patient needs 4 or 6 weeks of IV antibiotics given her unusual bacteremia. -11/25 discussed case with Dr. Daiva Eves ID: Recommend a minimum of 4 weeks IV antibiotics, he will try to convince patient to stay for 6 weeks.   Cavitary lung lesion/pulmonary embolism: -Blood cultures were obtained, continue penicillin   Chest pain - 11/25 substernal/left lateral aspect of thoracic cavity reproducible with palpation. - 11/25 EKG normal - 11/25 troponin WNL  Hyponatremia probably hypovolemic: -Resolved with IV fluid hydration.  Hepatitis C: -Per patient never received treatment. -Will need follow-up as an outpatient.  Hepatitis B - Per ID note 11/23 request patient receive hepatitis B vaccination -11/24 ordered hepatitis B vaccination  IV drug abuse/Polysubstance  abuse - July 2021 positive for cocaine - 11/24 UDS negative -Currently on methadone and oxy  Hypocalcemia -11/25 corrected calcium= 9.9  Hyperkalemia - 11/29 repeat K WNL.  Lab error    DVT  prophylaxis: Lovenox treatment dose Code Status: Full Family Communication:  Status is: Inpatient    Dispo: The patient is from: Home              Anticipated d/c is to: SNF              Anticipated d/c date is: > 3 days              Patient currently is not medically stable to d/c.      Consultants:  ID Dr. Daiva Eves Cardiothoracic surgery Dr. Dorris Fetch Cardiology Dr. Shari Prows   Procedures/Significant Events:  11/23 MRI C-spine/T-spine MRI cervical spine:   1. No evidence of discitis/osteomyelitis or epidural abscess. 2. No significant disc herniation, spinal canal stenosis or neural foraminal narrowing.   MRI thoracic spine: 1. No evidence of discitis/osteomyelitis or epidural abscess. 2. 5 mm round T2 STIR hyperintense and T1 hypointense lesion within the left aspect of the T6 vertebral body with possible subtle peripheral enhancement. The imaging features are nonspecific and this lesion is indeterminate in etiology. Short-interval, 3-6 month MRI follow-up is recommended to ensure stability. 5. Bilateral pleural effusions (small-to-moderate right, small left). 11/23 TEE Left Ventricle: Left ventricular ejection fraction, by estimation, is 60  to 65%. The left ventricle has normal function. The left ventricular  internal cavity size was normal in size.  Mitral Valve: There are small mobile denisities visualized on the anterior  and posterior mitral valve leaflets (along A1/P1).  Tricuspid Valve: There is thickening of the tricuspid valve septal leaflet  with small fibrinous stranding visualized. Findings concerning for  vegetations    Pulmonary Artery: There is a mobile density visualized on the pulmonary  valve. While this may be related to artifact, cannot rule out vegetation.  There is trivial pulmonary regurgitation.    I have personally reviewed and interpreted all radiology studies and my findings are as above.  VENTILATOR  SETTINGS:    Cultures   Antimicrobials: Anti-infectives (From admission, onward)    Start     Ordered Stop   12/09/20 1000  linezolid (ZYVOX) tablet 600 mg  Status:  Discontinued        12/09/20 0914 12/09/20 1443   12/06/20 0800  cefTRIAXone (ROCEPHIN) 2 g in sodium chloride 0.9 % 100 mL IVPB  Status:  Discontinued        12/05/20 0621 12/05/20 0629   12/05/20 2200  penicillin G potassium 12 Million Units in dextrose 5 % 500 mL continuous infusion        12/05/20 1711     12/05/20 2000  vancomycin (VANCOCIN) IVPB 750 mg/150 ml premix  Status:  Discontinued        12/05/20 0635 12/05/20 1242   12/05/20 2000  vancomycin (VANCOCIN) 500 mg in sodium chloride 0.9 % 100 mL IVPB  Status:  Discontinued        12/05/20 1242 12/05/20 1711   12/05/20 1800  clindamycin (CLEOCIN) IVPB 600 mg  Status:  Discontinued        12/05/20 1712 12/09/20 0914   12/05/20 0800  ceFEPIme (MAXIPIME) 2 g in sodium chloride 0.9 % 100 mL IVPB  Status:  Discontinued        12/05/20 0630 12/05/20 1711   12/05/20 0100  vancomycin (VANCOCIN) IVPB 1000  mg/200 mL premix        12/05/20 0051 12/05/20 0734   12/05/20 0100  cefTRIAXone (ROCEPHIN) 2 g in sodium chloride 0.9 % 100 mL IVPB        12/05/20 0051 12/05/20 0535         Devices    LINES / TUBES:  RIGHT brachial PICC double-lumen 12/09/2020>>>>    Continuous Infusions:  penicillin g continuous IV infusion 12 Million Units (12/16/20 0308)     Objective: Vitals:   12/15/20 2034 12/15/20 2332 12/16/20 0343 12/16/20 0802  BP: (!) 98/49 (!) 113/46 (!) 112/51 (!) 105/47  Pulse: 80 70 65 65  Resp: 18 18 12 16   Temp: 97.8 F (36.6 C) 97.8 F (36.6 C) 97.7 F (36.5 C) 98.2 F (36.8 C)  TempSrc: Oral Oral Oral Oral  SpO2: 96% 97% 98% 100%  Weight:   55.8 kg   Height:        Intake/Output Summary (Last 24 hours) at 12/16/2020 1220 Last data filed at 12/16/2020 12/18/2020 Gross per 24 hour  Intake --  Output 2100 ml  Net -2100 ml    Filed  Weights   12/14/20 0348 12/15/20 0334 12/16/20 0343  Weight: 54.6 kg 55.3 kg 55.8 kg    Examination:  General: A/O x4 No acute respiratory distress, cachectic Eyes: negative scleral hemorrhage, negative anisocoria, negative icterus ENT: Negative Runny nose, negative gingival bleeding, Neck:  Negative scars, masses, torticollis, lymphadenopathy, JVD Lungs: Clear to auscultation bilaterally without wheezes or crackles.   Cardiovascular: Regular rate and rhythm without murmur gallop or rub normal S1 and S2 Abdomen: negative abdominal pain, nondistended, positive soft, bowel sounds, no rebound, no ascites, no appreciable mass Extremities: No significant cyanosis, clubbing, or edema bilateral lower extremities Skin: Negative rashes, lesions, ulcers Psychiatric:  Negative depression, negative anxiety, negative fatigue, negative mania  Central nervous system:  Cranial nerves II through XII intact, tongue/uvula midline, all extremities muscle strength 5/5, sensation intact throughout, negative dysarthria, negative expressive aphasia, negative receptive aphasia.  .     Data Reviewed: Care during the described time interval was provided by me .  I have reviewed this patient's available data, including medical history, events of note, physical examination, and all test results as part of my evaluation.   CBC: Recent Labs  Lab 12/12/20 0500 12/13/20 0500 12/14/20 0155 12/15/20 0500 12/16/20 0427  WBC 8.5 6.6 7.4 6.4 4.9  NEUTROABS 4.2 2.9 3.2 2.7 2.0  HGB 10.6* 10.5* 11.3* 11.4* 10.0*  HCT 33.7* 33.7* 36.2 36.3 32.4*  MCV 89.2 89.2 88.5 88.8 88.8  PLT 329 334 393 392 351    Basic Metabolic Panel: Recent Labs  Lab 12/12/20 0500 12/13/20 0500 12/14/20 0155 12/15/20 0500 12/16/20 0427  NA 137 136 135 136 132*  K 4.0 4.1 4.5 4.7 6.2*  CL 101 98 98 99 97*  CO2 31 33* 31 31 30   GLUCOSE 87 95 99 84 285*  BUN 12 15 14 17 14   CREATININE 0.79 0.84 0.81 0.94 0.81  CALCIUM 8.2* 8.5*  8.6* 8.8* 8.3*  MG 1.8 1.9 2.2 2.0 1.9  PHOS 5.1* 5.3* 5.3* 4.6 4.0    GFR: Estimated Creatinine Clearance: 77.4 mL/min (by C-G formula based on SCr of 0.81 mg/dL). Liver Function Tests: Recent Labs  Lab 12/12/20 0500 12/13/20 0500 12/14/20 0155 12/15/20 0500 12/16/20 0427  AST 29 40 36 26 21  ALT 26 36 41 37 28  ALKPHOS 64 84 98 99 91  BILITOT 0.3 0.4 0.2*  0.5 0.4  PROT 5.4* 5.5* 6.0* 6.4* 5.5*  ALBUMIN 1.8* 1.9* 2.2* 2.4* 2.1*    No results for input(s): LIPASE, AMYLASE in the last 168 hours. No results for input(s): AMMONIA in the last 168 hours. Coagulation Profile: No results for input(s): INR, PROTIME in the last 168 hours.  Cardiac Enzymes: No results for input(s): CKTOTAL, CKMB, CKMBINDEX, TROPONINI in the last 168 hours. BNP (last 3 results) No results for input(s): PROBNP in the last 8760 hours. HbA1C: No results for input(s): HGBA1C in the last 72 hours. CBG: No results for input(s): GLUCAP in the last 168 hours. Lipid Profile: No results for input(s): CHOL, HDL, LDLCALC, TRIG, CHOLHDL, LDLDIRECT in the last 72 hours. Thyroid Function Tests: No results for input(s): TSH, T4TOTAL, FREET4, T3FREE, THYROIDAB in the last 72 hours. Anemia Panel: No results for input(s): VITAMINB12, FOLATE, FERRITIN, TIBC, IRON, RETICCTPCT in the last 72 hours. Urine analysis:    Component Value Date/Time   COLORURINE AMBER (A) 12/05/2020 0029   APPEARANCEUR HAZY (A) 12/05/2020 0029   LABSPEC 1.028 12/05/2020 0029   PHURINE 6.0 12/05/2020 0029   GLUCOSEU NEGATIVE 12/05/2020 0029   HGBUR NEGATIVE 12/05/2020 0029   BILIRUBINUR NEGATIVE 12/05/2020 0029   KETONESUR NEGATIVE 12/05/2020 0029   PROTEINUR 30 (A) 12/05/2020 0029   NITRITE NEGATIVE 12/05/2020 0029   LEUKOCYTESUR NEGATIVE 12/05/2020 0029   Sepsis Labs: (procalcitonin:4,lacticidven:4)  ) Recent Results (from the past 240 hour(s))  Culture, blood (Routine X 2) w Reflex to ID Panel     Status: None    Collection Time: 12/07/20  1:02 AM   Specimen: BLOOD  Result Value Ref Range Status   Specimen Description BLOOD RIGHT HAND  Final   Special Requests   Final    BOTTLES DRAWN AEROBIC ONLY Blood Culture results may not be optimal due to an inadequate volume of blood received in culture bottles   Culture   Final    NO GROWTH 5 DAYS Performed at [facility], [address], [place], [place] [address]    Report Status 12/12/2020 FINAL  Final  Culture, blood (Routine X 2) w Reflex to ID Panel     Status: None   Collection Time: 12/07/20 10:22 AM   Specimen: BLOOD RIGHT HAND  Result Value Ref Range Status   Specimen Description BLOOD RIGHT HAND  Final   Special Requests   Final    BOTTLES DRAWN AEROBIC ONLY Blood Culture results may not be optimal due to an inadequate volume of blood received in culture bottles   Culture   Final    NO GROWTH 5 DAYS Performed at [facility], [address] [place], [place] [address]    Report Status 12/12/2020 FINAL  Final  Surgical pcr screen     Status: Abnormal   Collection Time: 12/10/20  1:00 PM   Specimen: Nasal Mucosa; Nasal Swab  Result Value Ref Range Status   MRSA, PCR POSITIVE (A) NEGATIVE Final    Comment: RESULT CALLED TO, READ BACK BY AND VERIFIED WITH: Louie Boston RN [id number] 12/10/20 A BROWNING    Staphylococcus aureus POSITIVE (A) NEGATIVE Final    Comment: (NOTE) The Xpert SA Assay (FDA approved for NASAL specimens in patients 67 years of age and older), is one component of a comprehensive surveillance program. It is not intended to diagnose infection nor to guide or monitor treatment. Performed at [facility], [address] [place], [place] [address]  Radiology Studies: No results found.      Scheduled Meds:  apixaban  5 mg Oral BID   Chlorhexidine Gluconate Cloth  6 each Topical Daily   COVID-19 mRNA bivalent vaccine (Pfizer)  0.3 mL Intramuscular Once   divalproex  250 mg Oral Q12H    methadone  20 mg Oral Daily   pantoprazole  40 mg Oral BID   sodium chloride flush  10-40 mL Intracatheter Q12H   traZODone  50 mg Oral QHS   Continuous Infusions:  penicillin g continuous IV infusion 12 Million Units (12/16/20 0308)     LOS: 11 days   The patient is critically ill with multiple organ systems failure and requires high complexity decision making for assessment and support, frequent evaluation and titration of therapies, application of advanced monitoring technologies and extensive interpretation of multiple databases. Critical Care Time devoted to patient care services described in this note  Time spent: 40 minutes     Summer Cain, Roselind Messier, MD [facility]   If 7PM-7AM, please contact night-coverage 12/16/2020, 12:20 PM

## 2020-12-17 DIAGNOSIS — I059 Rheumatic mitral valve disease, unspecified: Secondary | ICD-10-CM | POA: Diagnosis not present

## 2020-12-17 DIAGNOSIS — I82622 Acute embolism and thrombosis of deep veins of left upper extremity: Secondary | ICD-10-CM | POA: Diagnosis not present

## 2020-12-17 DIAGNOSIS — I079 Rheumatic tricuspid valve disease, unspecified: Secondary | ICD-10-CM | POA: Diagnosis not present

## 2020-12-17 LAB — CBC WITH DIFFERENTIAL/PLATELET
Abs Immature Granulocytes: 0 10*3/uL (ref 0.00–0.07)
Band Neutrophils: 3 %
Basophils Absolute: 0 10*3/uL (ref 0.0–0.1)
Basophils Relative: 0 %
Eosinophils Absolute: 0 10*3/uL (ref 0.0–0.5)
Eosinophils Relative: 0 %
HCT: 32.4 % — ABNORMAL LOW (ref 36.0–46.0)
Hemoglobin: 10.2 g/dL — ABNORMAL LOW (ref 12.0–15.0)
Lymphocytes Relative: 59 %
Lymphs Abs: 2.8 10*3/uL (ref 0.7–4.0)
MCH: 28.3 pg (ref 26.0–34.0)
MCHC: 31.5 g/dL (ref 30.0–36.0)
MCV: 89.8 fL (ref 80.0–100.0)
Monocytes Absolute: 0.2 10*3/uL (ref 0.1–1.0)
Monocytes Relative: 5 %
Neutro Abs: 1.7 10*3/uL (ref 1.7–7.7)
Neutrophils Relative %: 33 %
Platelets: 323 10*3/uL (ref 150–400)
RBC: 3.61 MIL/uL — ABNORMAL LOW (ref 3.87–5.11)
RDW: 15.6 % — ABNORMAL HIGH (ref 11.5–15.5)
WBC: 4.7 10*3/uL (ref 4.0–10.5)
nRBC: 0 % (ref 0.0–0.2)
nRBC: 0 /100 WBC

## 2020-12-17 LAB — COMPREHENSIVE METABOLIC PANEL
ALT: 29 U/L (ref 0–44)
AST: 25 U/L (ref 15–41)
Albumin: 2.2 g/dL — ABNORMAL LOW (ref 3.5–5.0)
Alkaline Phosphatase: 88 U/L (ref 38–126)
Anion gap: 5 (ref 5–15)
BUN: 10 mg/dL (ref 6–20)
CO2: 29 mmol/L (ref 22–32)
Calcium: 8.4 mg/dL — ABNORMAL LOW (ref 8.9–10.3)
Chloride: 97 mmol/L — ABNORMAL LOW (ref 98–111)
Creatinine, Ser: 0.7 mg/dL (ref 0.44–1.00)
GFR, Estimated: >60 mL/min (ref >60)
Glucose, Bld: 217 mg/dL — ABNORMAL HIGH (ref 70–99)
Potassium: 5.2 mmol/L — ABNORMAL HIGH (ref 3.5–5.1)
Sodium: 131 mmol/L — ABNORMAL LOW (ref 135–145)
Total Bilirubin: 0.5 mg/dL (ref 0.3–1.2)
Total Protein: 5.7 g/dL — ABNORMAL LOW (ref 6.5–8.1)

## 2020-12-17 LAB — PHOSPHORUS: Phosphorus: 4.8 mg/dL — ABNORMAL HIGH (ref 2.5–4.6)

## 2020-12-17 LAB — MAGNESIUM: Magnesium: 1.9 mg/dL (ref 1.7–2.4)

## 2020-12-17 MED ORDER — TRAZODONE HCL 150 MG PO TABS
75.0000 mg | ORAL_TABLET | Freq: Every day | ORAL | Status: DC
  Administered 2020-12-17 – 2020-12-20 (×4): 75 mg via ORAL
  Filled 2020-12-17 (×4): qty 1

## 2020-12-17 MED ORDER — ENSURE MAX PROTEIN PO LIQD
11.0000 [oz_av] | Freq: Every day | ORAL | Status: DC
  Administered 2020-12-17: 11 [oz_av] via ORAL
  Filled 2020-12-17: qty 330

## 2020-12-17 MED ORDER — ENSURE ENLIVE PO LIQD
237.0000 mL | Freq: Two times a day (BID) | ORAL | Status: DC
  Administered 2020-12-18 – 2020-12-23 (×9): 237 mL via ORAL

## 2020-12-17 MED ORDER — PANTOPRAZOLE SODIUM 40 MG PO TBEC
40.0000 mg | DELAYED_RELEASE_TABLET | Freq: Every day | ORAL | Status: DC
  Administered 2020-12-18 – 2020-12-25 (×7): 40 mg via ORAL
  Filled 2020-12-17 (×10): qty 1

## 2020-12-17 MED ORDER — ADULT MULTIVITAMIN W/MINERALS CH
1.0000 | ORAL_TABLET | Freq: Every day | ORAL | Status: DC
  Administered 2020-12-17 – 2020-12-26 (×9): 1 via ORAL
  Filled 2020-12-17 (×10): qty 1

## 2020-12-17 NOTE — Progress Notes (Addendum)
PROGRESS NOTE    Summer Cain  WOE:[id number] DOB: [birth year] DOA: 12/04/2020 PCP: Patient, No Pcp Per (Inactive)    Brief Narrative:  Summer Cain was admitted to the hospital with the working diagnosis of left upper extremity deep vein thrombosis, complicated with cellulitis/thrombophlebitis, strep pyogenes bacteremia, endocarditis (tricuspid/ mitral/ pulmonic valve vegetations) Sepsis present on admission.   35 yo female with the past medical history of IV drug abuse, hepatitis C who presented with left arm erythema, edema and tenderness. Reported using Iv fentanyl an smoking cocaine several days before developing her symptoms.  Also complained of pleuritic chest pain, dyspnea and hemoptysis. On her initial physical examination her blood pressure was 98/57, heart rate 78, respirate 22, oxygen saturation 91%, her lungs were clear to auscultation bilaterally, heart S1-S2, present, rhythmic, tachycardic, abdomen soft nontender, left upper extremity with edema, erythema and increased local temperature.  Crusted lesions involving bilateral extremities.  Sodium 127, potassium 4.0, chloride 91, bicarb 25, glucose 115, BUN 14, creatinine 0.72, AST 20, ALT 17, white count 15.9, hemoglobin 11.0, hematocrit 32.8, platelets 181. SARS COVID-19 negative.  Chest radiograph no infiltrates.  Positive scoliosis.  CT chest no pulm embolism.  Positive cavitary lesion in the basilar lingula.  Left upper extremity CT with long segment venous thrombus extending into the left axillary vein at the junction of the subclavian vein consistent with the vein thrombosis.  Associated inflammatory changes indicative of thrombophlebitis.  Patient was placed on broad-spectrum antibiotic therapy. Blood cultures were positive for Streptococcus pyogenes. Echocardiography with tricuspid, mitral and pulmonic valve vegetations. (No aortic root abscess)   Further work-up with MRI cervical spine and thoracic spine with no  evidence of discitis, osteomyelitis or abscess.  Plan to complete at least 4 weeks of IV antibiotic therapy.   Assessment & Plan:   Principal Problem:   Acute deep vein thrombosis (DVT) of left upper extremity (HCC) Active Problems:   Moderate cocaine use disorder (HCC)   IVDU (intravenous drug user)   Hepatitis C   Sepsis (HCC)   Opioid use disorder   Thrombophlebitis   Hyponatremia   Pneumonia   Deep vein thrombosis (DVT) of left upper extremity, unspecified chronicity, unspecified vein (HCC)   Endocarditis of mitral valve   Endocarditis of tricuspid valve   Polysubstance abuse (HCC)   Streptococcal bacteremia   Left upper extremity cellulitis/ thrombophlebitis, complicated with tricuspid, mitral and pulmonary valve endocarditis, pulmonary septic embolic phenomena and sepsis (present on admission).  Patient continue to have left upper extremity pain and pleuritic chest pain, with productive cough.  She has been afebrile, wbc 4,7.  Blood cultures from 11/20 with no growth. Blood culture from 11/17 positive for group A streptococcus.   Continue antibiotic therapy with IV penicillin for at least 4 weeks, ideally 6 weeks.  Pain control with ibuprofen and acetaminophen.  On methadone and oxycodone.  Continue telemetry monitoring for now.  Anticoagulation with apixaban, elevated left upper extremity as tolerated.   2. Hyponatremia, hypocalcemia, and hyperkalemia  renal function stable with serum cr at 0,70, K is 5,2 and serum bicarbonate at 29. Follow up renal function in am.  Add nutritional supplements and consult nutrition for recommendations.   3. Hepatitis C will need outpatient follow up. Had hep B vaccination   4. IV drug abuse/ insomnia. Continue with methadone. Not candidate for outpatient IV antibiotic therapy.  Will increase trazodone to 75 mg at night.   Patient continue to be at high risk for worsening endocarditis.   Status is:  Inpatient  Remains inpatient  appropriate because: IV antibiotics   DVT prophylaxis: Apixaban   Code Status:    full  Family Communication:  No family a the bedside      Consultants:  ID Cardiology TEE   Procedures:  TEE   Antimicrobials:  Penicillin IV     Subjective: Patient continue to have left upper extremity edema and pain, positive pleuritic chest pain and productive cough, no nausea or vomiting.   Objective: Vitals:   12/16/20 2000 12/16/20 2330 12/17/20 0451 12/17/20 0800  BP: (!) 109/51 (!) 104/40 (!) 95/43 138/68  Pulse: 76 64 60 89  Resp: 18 18 17 19   Temp: 98.5 F (36.9 C) 97.9 F (36.6 C) 98.3 F (36.8 C) 98.5 F (36.9 C)  TempSrc: Oral Oral Oral Oral  SpO2: 96% 96% 98% 98%  Weight:   55.8 kg   Height:        Intake/Output Summary (Last 24 hours) at 12/17/2020 1051 Last data filed at 12/17/2020 0108 Gross per 24 hour  Intake 2445.46 ml  Output 603 ml  Net 1842.46 ml   Filed Weights   12/15/20 0334 12/16/20 0343 12/17/20 0451  Weight: 55.3 kg 55.8 kg 55.8 kg    Examination:   General: Not in pain or dyspnea, deconditioned  Neurology: Awake and alert, non focal  E ENT: no pallor, no icterus, oral mucosa moist Cardiovascular: No JVD. S1-S2 present, rhythmic, no gallops, rubs, or murmurs. Left upper extremity non pitting edema, ++ indurated  Pulmonary: positive breath sounds bilaterally, with no wheezing, rhonchi or rales. Gastrointestinal. Abdomen soft and non tender Skin. No left upper extremity erythema,  Musculoskeletal: no joint deformities     Data Reviewed: I have personally reviewed following labs and imaging studies  CBC: Recent Labs  Lab 12/13/20 0500 12/14/20 0155 12/15/20 0500 12/16/20 0427 12/17/20 0430  WBC 6.6 7.4 6.4 4.9 4.7  NEUTROABS 2.9 3.2 2.7 2.0 1.7  HGB 10.5* 11.3* 11.4* 10.0* 10.2*  HCT 33.7* 36.2 36.3 32.4* 32.4*  MCV 89.2 88.5 88.8 88.8 89.8  PLT 334 393 392 351 [id number]   Basic Metabolic Panel: Recent Labs  Lab 12/13/20 0500  12/14/20 0155 12/15/20 0500 12/16/20 0427 12/16/20 1247 12/17/20 0430  NA 136 135 136 132*  --  131*  K 4.1 4.5 4.7 6.2* 3.9 5.2*  CL 98 98 99 97*  --  97*  CO2 33* 31 31 30   --  29  GLUCOSE 95 99 84 285*  --  217*  BUN 15 14 17 14   --  10  CREATININE 0.84 0.81 0.94 0.81  --  0.70  CALCIUM 8.5* 8.6* 8.8* 8.3*  --  8.4*  MG 1.9 2.2 2.0 1.9 2.0 1.9  PHOS 5.3* 5.3* 4.6 4.0  --  4.8*   GFR: Estimated Creatinine Clearance: 78.3 mL/min (by C-G formula based on SCr of 0.7 mg/dL). Liver Function Tests: Recent Labs  Lab 12/13/20 0500 12/14/20 0155 12/15/20 0500 12/16/20 0427 12/17/20 0430  AST 40 36 26 21 25   ALT 36 41 37 28 29  ALKPHOS 84 98 99 91 88  BILITOT 0.4 0.2* 0.5 0.4 0.5  PROT 5.5* 6.0* 6.4* 5.5* 5.7*  ALBUMIN 1.9* 2.2* 2.4* 2.1* 2.2*   No results for input(s): LIPASE, AMYLASE in the last 168 hours. No results for input(s): AMMONIA in the last 168 hours. Coagulation Profile: No results for input(s): INR, PROTIME in the last 168 hours. Cardiac Enzymes: No results for input(s): CKTOTAL, CKMB, CKMBINDEX, TROPONINI  in the last 168 hours. BNP (last 3 results) No results for input(s): PROBNP in the last 8760 hours. HbA1C: No results for input(s): HGBA1C in the last 72 hours. CBG: No results for input(s): GLUCAP in the last 168 hours. Lipid Profile: No results for input(s): CHOL, HDL, LDLCALC, TRIG, CHOLHDL, LDLDIRECT in the last 72 hours. Thyroid Function Tests: No results for input(s): TSH, T4TOTAL, FREET4, T3FREE, THYROIDAB in the last 72 hours. Anemia Panel: No results for input(s): VITAMINB12, FOLATE, FERRITIN, TIBC, IRON, RETICCTPCT in the last 72 hours.    Radiology Studies: I have reviewed all of the imaging during this hospital visit personally     Scheduled Meds:  apixaban  5 mg Oral BID   Chlorhexidine Gluconate Cloth  6 each Topical Daily   COVID-19 mRNA bivalent vaccine (Pfizer)  0.3 mL Intramuscular Once   divalproex  250 mg Oral Q12H    methadone  20 mg Oral Daily   pantoprazole  40 mg Oral BID   sodium chloride flush  10-40 mL Intracatheter Q12H   traZODone  50 mg Oral QHS   Continuous Infusions:  penicillin g continuous IV infusion 12 Million Units (12/17/20 0108)     LOS: 12 days        Andersen Iorio Gerome Apley, MD

## 2020-12-17 NOTE — Progress Notes (Addendum)
Initial Nutrition Assessment  DOCUMENTATION CODES:   Non-severe (moderate) malnutrition in context of social or environmental circumstances  INTERVENTION:   Ensure Enlive po BID, each supplement provides 350 kcal and 20 grams of protein  D/C Ensure Max, patient needs a more concentrated formula that is high in calories and protein.  MVI with minerals daily  NUTRITION DIAGNOSIS:   Moderate Malnutrition related to social / environmental circumstances, acute illness (IVDU, endocarditis) as evidenced by mild muscle depletion, mild fat depletion.  GOAL:   Patient will meet greater than or equal to 90% of their needs  MONITOR:   PO intake, Supplement acceptance  REASON FOR ASSESSMENT:   Consult Assessment of nutrition requirement/status  ASSESSMENT:    35 yo female admitted with LUE DVT, cellulitis/thrombophlebitis, strep pyogenes bacteremia, endocarditis, sepsis. PMH includes IVDU, hepatitis C.  Patient states that her usual weight is 95 lbs and that she has gained at least 15 lbs since admission. She attributes weight gain to being inactive and just sitting in the bed. She is usually very active and walks from one side of town to the other for her jobs. Discussed the importance of adequate protein and calorie intake to support healing and recovery. She agreed to drink Ensure supplements BID between meals. She has been eating 3 meals per day since admission and snacks from her trays at night time.   Currently on a regular diet. Meal intakes: 70-100% Supplements: Ensure Max daily (Ensure Enlive at bedside)  Medications reviewed and include Depakote, methadone, Protonix, IV penicillin.  Labs reviewed. Na 131, K 5.2, glucose 217, phos 4.8 Corrected calcium is WNL, 9.84.  Patient reports that she has been told that her labs (K, Phos, glucose) were elevated d/t blood clots in the blood sample.   Admission weight 49.4 kg (11/18) Current weight 55.8 kg Suspect increase in weight  is related to edema.   Given history of IVDU, suspect poor quality nutrition intake PTA. Discussed ways to increase protein and calorie intake.   NUTRITION - FOCUSED PHYSICAL EXAM:  Flowsheet Row Most Recent Value  Orbital Region Mild depletion  Upper Arm Region No depletion  Thoracic and Lumbar Region Mild depletion  Buccal Region Mild depletion  Temple Region Moderate depletion  Clavicle Bone Region Moderate depletion  Clavicle and Acromion Bone Region Mild depletion  Scapular Bone Region Mild depletion  Dorsal Hand No depletion  Patellar Region No depletion  Anterior Thigh Region No depletion  Posterior Calf Region No depletion  Edema (RD Assessment) Mild  [BUE, non-pitting]  Hair Reviewed  Eyes Reviewed  Mouth Reviewed  Skin Reviewed  Nails Reviewed       Diet Order:   Diet Order             Diet regular Room service appropriate? Yes; Fluid consistency: Thin  Diet effective now                   EDUCATION NEEDS:   Education needs have been addressed  Skin:  Skin Assessment: Reviewed RN Assessment  Last BM:  11/27  Height:   Ht Readings from Last 1 Encounters:  12/08/20 5' (1.524 m)    Weight:   Wt Readings from Last 1 Encounters:  12/17/20 55.8 kg    BMI:  Body mass index is 24.03 kg/m.  Estimated Nutritional Needs:   Kcal:  1900-2100  Protein:  85-100 gm  Fluid:  >/= 1.95 L    Gabriel Rainwater, RD, LDN, CNSC Please refer to Amion for  contact information.

## 2020-12-18 ENCOUNTER — Other Ambulatory Visit: Payer: Self-pay | Admitting: Infectious Disease

## 2020-12-18 ENCOUNTER — Telehealth: Payer: Self-pay | Admitting: Infectious Disease

## 2020-12-18 ENCOUNTER — Other Ambulatory Visit (HOSPITAL_COMMUNITY): Payer: Self-pay

## 2020-12-18 DIAGNOSIS — Z79899 Other long term (current) drug therapy: Secondary | ICD-10-CM

## 2020-12-18 DIAGNOSIS — F1990 Other psychoactive substance use, unspecified, uncomplicated: Secondary | ICD-10-CM | POA: Diagnosis not present

## 2020-12-18 DIAGNOSIS — I82622 Acute embolism and thrombosis of deep veins of left upper extremity: Secondary | ICD-10-CM | POA: Diagnosis not present

## 2020-12-18 DIAGNOSIS — B182 Chronic viral hepatitis C: Secondary | ICD-10-CM

## 2020-12-18 DIAGNOSIS — F1420 Cocaine dependence, uncomplicated: Secondary | ICD-10-CM

## 2020-12-18 DIAGNOSIS — I079 Rheumatic tricuspid valve disease, unspecified: Secondary | ICD-10-CM | POA: Diagnosis not present

## 2020-12-18 DIAGNOSIS — F1190 Opioid use, unspecified, uncomplicated: Secondary | ICD-10-CM

## 2020-12-18 DIAGNOSIS — E440 Moderate protein-calorie malnutrition: Secondary | ICD-10-CM | POA: Insufficient documentation

## 2020-12-18 DIAGNOSIS — I059 Rheumatic mitral valve disease, unspecified: Secondary | ICD-10-CM | POA: Diagnosis not present

## 2020-12-18 LAB — BASIC METABOLIC PANEL
Anion gap: 9 (ref 5–15)
BUN: 11 mg/dL (ref 6–20)
CO2: 29 mmol/L (ref 22–32)
Calcium: 8.5 mg/dL — ABNORMAL LOW (ref 8.9–10.3)
Chloride: 95 mmol/L — ABNORMAL LOW (ref 98–111)
Creatinine, Ser: 0.88 mg/dL (ref 0.44–1.00)
GFR, Estimated: >60 mL/min (ref >60)
Glucose, Bld: 256 mg/dL — ABNORMAL HIGH (ref 70–99)
Potassium: 5.3 mmol/L — ABNORMAL HIGH (ref 3.5–5.1)
Sodium: 133 mmol/L — ABNORMAL LOW (ref 135–145)

## 2020-12-18 LAB — CBC WITH DIFFERENTIAL/PLATELET
Abs Immature Granulocytes: 0.05 10*3/uL (ref 0.00–0.07)
Basophils Absolute: 0 10*3/uL (ref 0.0–0.1)
Basophils Relative: 1 %
Eosinophils Absolute: 0.1 10*3/uL (ref 0.0–0.5)
Eosinophils Relative: 1 %
HCT: 32.5 % — ABNORMAL LOW (ref 36.0–46.0)
Hemoglobin: 10.2 g/dL — ABNORMAL LOW (ref 12.0–15.0)
Immature Granulocytes: 1 %
Lymphocytes Relative: 55 %
Lymphs Abs: 2.3 10*3/uL (ref 0.7–4.0)
MCH: 28.3 pg (ref 26.0–34.0)
MCHC: 31.4 g/dL (ref 30.0–36.0)
MCV: 90.3 fL (ref 80.0–100.0)
Monocytes Absolute: 0.3 10*3/uL (ref 0.1–1.0)
Monocytes Relative: 7 %
Neutro Abs: 1.5 10*3/uL — ABNORMAL LOW (ref 1.7–7.7)
Neutrophils Relative %: 35 %
Platelets: 302 10*3/uL (ref 150–400)
RBC: 3.6 MIL/uL — ABNORMAL LOW (ref 3.87–5.11)
RDW: 15.7 % — ABNORMAL HIGH (ref 11.5–15.5)
WBC: 4.2 10*3/uL (ref 4.0–10.5)
nRBC: 0 % (ref 0.0–0.2)

## 2020-12-18 MED ORDER — POLYETHYLENE GLYCOL 3350 17 G PO PACK
17.0000 g | PACK | Freq: Every day | ORAL | Status: DC
  Administered 2020-12-18 – 2020-12-23 (×5): 17 g via ORAL
  Filled 2020-12-18 (×7): qty 1

## 2020-12-18 MED ORDER — CABOTEGRAVIR ER 600 MG/3ML IM SUER: 600.0000 mg | Freq: Once | INTRAMUSCULAR | Status: DC

## 2020-12-18 NOTE — Progress Notes (Addendum)
Subjective:  C/o left arm swelling that is now going on  Antibiotics:  Anti-infectives (From admission, onward)    Start     Dose/Rate Route Frequency Ordered Stop   12/09/20 1000  linezolid (ZYVOX) tablet 600 mg  Status:  Discontinued        600 mg Oral Every 12 hours 12/09/20 0914 12/09/20 1443   12/06/20 0800  cefTRIAXone (ROCEPHIN) 2 g in sodium chloride 0.9 % 100 mL IVPB  Status:  Discontinued        2 g 200 mL/hr over 30 Minutes Intravenous Every 24 hours 12/05/20 0621 12/05/20 0629   12/05/20 2200  penicillin G potassium 12 Million Units in dextrose 5 % 500 mL continuous infusion        12 Million Units 41.7 mL/hr over 12 Hours Intravenous Every 12 hours 12/05/20 1711     12/05/20 2000  vancomycin (VANCOCIN) IVPB 750 mg/150 ml premix  Status:  Discontinued        750 mg 150 mL/hr over 60 Minutes Intravenous Every 12 hours 12/05/20 0635 12/05/20 1242   12/05/20 2000  vancomycin (VANCOCIN) 500 mg in sodium chloride 0.9 % 100 mL IVPB  Status:  Discontinued        500 mg 100 mL/hr over 60 Minutes Intravenous Every 12 hours 12/05/20 1242 12/05/20 1711   12/05/20 1800  clindamycin (CLEOCIN) IVPB 600 mg  Status:  Discontinued        600 mg 100 mL/hr over 30 Minutes Intravenous Every 8 hours 12/05/20 1712 12/09/20 0914   12/05/20 0800  ceFEPIme (MAXIPIME) 2 g in sodium chloride 0.9 % 100 mL IVPB  Status:  Discontinued        2 g 200 mL/hr over 30 Minutes Intravenous Every 8 hours 12/05/20 0630 12/05/20 1711   12/05/20 0100  vancomycin (VANCOCIN) IVPB 1000 mg/200 mL premix        1,000 mg 200 mL/hr over 60 Minutes Intravenous  Once 12/05/20 0051 12/05/20 0734   12/05/20 0100  cefTRIAXone (ROCEPHIN) 2 g in sodium chloride 0.9 % 100 mL IVPB        2 g 200 mL/hr over 30 Minutes Intravenous  Once 12/05/20 0051 12/05/20 0535       Medications: Scheduled Meds:  apixaban  5 mg Oral BID   Chlorhexidine Gluconate Cloth  6 each Topical Daily   COVID-19 mRNA bivalent  vaccine (Pfizer)  0.3 mL Intramuscular Once   divalproex  250 mg Oral Q12H   feeding supplement  237 mL Oral BID BM   methadone  20 mg Oral Daily   multivitamin with minerals  1 tablet Oral Daily   pantoprazole  40 mg Oral Daily   polyethylene glycol  17 g Oral Daily   sodium chloride flush  10-40 mL Intracatheter Q12H   traZODone  75 mg Oral QHS   Continuous Infusions:  penicillin g continuous IV infusion 12 Million Units (12/18/20 0342)   PRN Meds:.acetaminophen **OR** acetaminophen, diphenhydrAMINE, ibuprofen, loperamide, ondansetron **OR** ondansetron (ZOFRAN) IV, oxyCODONE, sodium chloride flush    Objective: Weight change: -2.5 kg  Intake/Output Summary (Last 24 hours) at 12/18/2020 1222 Last data filed at 12/18/2020 1200 Gross per 24 hour  Intake 1693.94 ml  Output --  Net 1693.94 ml    Blood pressure (!) 106/56, pulse 70, temperature 98.2 F (36.8 C), temperature source Oral, resp. rate 19, height 5' (1.524 m), weight 53.3 kg, SpO2 98 %. Temp:  [98.2 F (36.8  C)-98.7 F (37.1 C)] 98.2 F (36.8 C) (12/01 1205) Pulse Rate:  [67-76] 70 (12/01 1205) Resp:  [18-19] 19 (12/01 1205) BP: (85-118)/(48-61) 106/56 (12/01 1205) SpO2:  [97 %-98 %] 98 % (12/01 1205) Weight:  [53.3 kg] 53.3 kg (12/01 0418)  Physical Exam: Physical Exam Constitutional:      General: She is not in acute distress.    Appearance: She is well-developed. She is not diaphoretic.  HENT:     Head: Normocephalic and atraumatic.     Right Ear: External ear normal.     Left Ear: External ear normal.     Mouth/Throat:     Pharynx: No oropharyngeal exudate.  Eyes:     General: No scleral icterus.    Extraocular Movements: Extraocular movements intact.     Conjunctiva/sclera: Conjunctivae normal.     Pupils: Pupils are equal, round, and reactive to light.  Cardiovascular:     Rate and Rhythm: Normal rate and regular rhythm.  Pulmonary:     Effort: Pulmonary effort is normal. No respiratory  distress.     Breath sounds: No wheezing.  Abdominal:     General: Bowel sounds are normal. There is no distension.     Palpations: Abdomen is soft.     Tenderness: There is no abdominal tenderness. There is no rebound.  Musculoskeletal:        General: Normal range of motion.  Lymphadenopathy:     Cervical: No cervical adenopathy.  Skin:    General: Skin is warm and dry.     Coloration: Skin is pale.     Findings: No erythema or rash.  Neurological:     General: No focal deficit present.     Mental Status: She is alert and oriented to person, place, and time.     Motor: No abnormal muscle tone.  Psychiatric:        Mood and Affect: Mood normal.        Behavior: Behavior normal.        Thought Content: Thought content normal.        Judgment: Judgment normal.     CBC:    BMET Recent Labs    12/17/20 0430 12/18/20 0459  NA 131* 133*  K 5.2* 5.3*  CL 97* 95*  CO2 29 29  GLUCOSE 217* 256*  BUN 10 11  CREATININE 0.70 0.88  CALCIUM 8.4* 8.5*      Liver Panel  Recent Labs    12/16/20 0427 12/17/20 0430  PROT 5.5* 5.7*  ALBUMIN 2.1* 2.2*  AST 21 25  ALT 28 29  ALKPHOS 91 88  BILITOT 0.4 0.5        Sedimentation Rate No results for input(s): ESRSEDRATE in the last 72 hours. C-Reactive Protein No results for input(s): CRP in the last 72 hours.  Micro Results: Recent Results (from the past 720 hour(s))  Resp Panel by RT-PCR (Flu A&B, Covid) Peripheral     Status: None   Collection Time: 12/04/20  8:52 PM   Specimen: Peripheral; Nasopharyngeal(NP) swabs in vial transport medium  Result Value Ref Range Status   SARS Coronavirus 2 by RT PCR NEGATIVE NEGATIVE Final    Comment: (NOTE) SARS-CoV-2 target nucleic acids are NOT DETECTED.  The SARS-CoV-2 RNA is generally detectable in upper respiratory specimens during the acute phase of infection. The lowest concentration of SARS-CoV-2 viral copies this assay can detect is 138 copies/mL. A negative  result does not preclude SARS-Cov-2 infection and should not be used  as the sole basis for treatment or other patient management decisions. A negative result may occur with  improper specimen collection/handling, submission of specimen other than nasopharyngeal swab, presence of viral mutation(s) within the areas targeted by this assay, and inadequate number of viral copies(<138 copies/mL). A negative result must be combined with clinical observations, patient history, and epidemiological information. The expected result is Negative.  Fact Sheet for Patients:  EntrepreneurPulse.com.au  Fact Sheet for Healthcare Providers:  IncredibleEmployment.be  This test is no t yet approved or cleared by the [place] FDA and  has been authorized for detection and/or diagnosis of SARS-CoV-2 by FDA under an Emergency Use Authorization (EUA). This EUA will remain  in effect (meaning this test can be used) for the duration of the COVID-19 declaration under Section 564(b)(1) of the Act, 21 U.S.C.section 360bbb-3(b)(1), unless the authorization is terminated  or revoked sooner.       Influenza A by PCR NEGATIVE NEGATIVE Final   Influenza B by PCR NEGATIVE NEGATIVE Final    Comment: (NOTE) The Xpert Xpress SARS-CoV-2/FLU/RSV plus assay is intended as an aid in the diagnosis of influenza from Nasopharyngeal swab specimens and should not be used as a sole basis for treatment. Nasal washings and aspirates are unacceptable for Xpert Xpress SARS-CoV-2/FLU/RSV testing.  Fact Sheet for Patients: EntrepreneurPulse.com.au  Fact Sheet for Healthcare Providers: IncredibleEmployment.be  This test is not yet approved or cleared by the [place] FDA and has been authorized for detection and/or diagnosis of SARS-CoV-2 by FDA under an Emergency Use Authorization (EUA). This EUA will remain in effect (meaning this test can be used)  for the duration of the COVID-19 declaration under Section 564(b)(1) of the Act, 21 U.S.C. section 360bbb-3(b)(1), unless the authorization is terminated or revoked.  Performed at [facility], [place] [address], [place], [place] [address]   Blood Culture (routine x 2)     Status: Abnormal   Collection Time: 12/04/20  9:08 PM   Specimen: BLOOD RIGHT HAND  Result Value Ref Range Status   Specimen Description BLOOD RIGHT HAND  Final   Special Requests   Final    BOTTLES DRAWN AEROBIC AND ANAEROBIC Blood Culture adequate volume   Culture  Setup Time   Final    GRAM POSITIVE COCCI IN CHAINS IN PAIRS AEROBIC BOTTLE ONLY Organism ID to follow CRITICAL RESULT CALLED TO, READ BACK BY AND VERIFIED WITHJiles Garter [facility] PHARMD [id number] 12/05/20 A BROWNING    Culture (A)  Final    GROUP A STREP (S.PYOGENES) ISOLATED HEALTH DEPARTMENT NOTIFIED Performed at [facility], [place] [address], [place], [place] [address]    Report Status 12/08/2020 FINAL  Final   Organism ID, Bacteria GROUP A STREP (S.PYOGENES) ISOLATED  Final      Susceptibility   Group a strep (s.pyogenes) isolated - MIC*    PENICILLIN <=0.06 SENSITIVE Sensitive     CEFTRIAXONE <=0.12 SENSITIVE Sensitive     ERYTHROMYCIN >=8 RESISTANT Resistant     LEVOFLOXACIN <=0.25 SENSITIVE Sensitive     VANCOMYCIN 0.25 SENSITIVE Sensitive     * GROUP A STREP (S.PYOGENES) ISOLATED  Blood Culture (routine x 2)     Status: Abnormal   Collection Time: 12/04/20  9:08 PM   Specimen: BLOOD RIGHT ARM  Result Value Ref Range Status   Specimen Description BLOOD RIGHT ARM  Final   Special Requests   Final    BOTTLES DRAWN AEROBIC ONLY Blood Culture adequate volume   Culture  Setup Time   Final    GRAM POSITIVE COCCI IN CHAINS AEROBIC BOTTLE ONLY CRITICAL VALUE NOTED.  VALUE IS CONSISTENT WITH PREVIOUSLY REPORTED AND CALLED VALUE.    Culture (A)  Final    GROUP A STREP (S.PYOGENES) ISOLATED SUSCEPTIBILITIES PERFORMED ON PREVIOUS CULTURE  WITHIN THE LAST 5 DAYS. HEALTH DEPARTMENT NOTIFIED Performed at [facility], [place] [address] [place], [place] [address]    Report Status 12/08/2020 FINAL  Final  Blood Culture ID Panel (Reflexed)     Status: Abnormal   Collection Time: 12/04/20  9:08 PM  Result Value Ref Range Status   Enterococcus faecalis NOT DETECTED NOT DETECTED Final   Enterococcus Faecium NOT DETECTED NOT DETECTED Final   Listeria monocytogenes NOT DETECTED NOT DETECTED Final   Staphylococcus species NOT DETECTED NOT DETECTED Final   Staphylococcus aureus (BCID) NOT DETECTED NOT DETECTED Final   Staphylococcus epidermidis NOT DETECTED NOT DETECTED Final   Staphylococcus lugdunensis NOT DETECTED NOT DETECTED Final   Streptococcus species DETECTED (A) NOT DETECTED Final    Comment: CRITICAL RESULT CALLED TO, READ BACK BY AND VERIFIED WITHJiles Garter [facility] PHARMD [id number] 12/05/20 A BROWNING    Streptococcus agalactiae NOT DETECTED NOT DETECTED Final   Streptococcus pneumoniae NOT DETECTED NOT DETECTED Final   Streptococcus pyogenes DETECTED (A) NOT DETECTED Final    Comment: CRITICAL RESULT CALLED TO, READ BACK BY AND VERIFIED WITHJiles Garter [facility] PHARMD 1656 12/05/20 A BROWNING    A.calcoaceticus-baumannii NOT DETECTED NOT DETECTED Final   Bacteroides fragilis NOT DETECTED NOT DETECTED Final   Enterobacterales NOT DETECTED NOT DETECTED Final   Enterobacter cloacae complex NOT DETECTED NOT DETECTED Final   Escherichia coli NOT DETECTED NOT DETECTED Final   Klebsiella aerogenes NOT DETECTED NOT DETECTED Final   Klebsiella oxytoca NOT DETECTED NOT DETECTED Final   Klebsiella pneumoniae NOT DETECTED NOT DETECTED Final   Proteus species NOT DETECTED NOT DETECTED Final   Salmonella species NOT DETECTED NOT DETECTED Final   Serratia marcescens NOT DETECTED NOT DETECTED Final   Haemophilus influenzae NOT DETECTED NOT DETECTED Final   Neisseria meningitidis NOT DETECTED NOT DETECTED Final   Pseudomonas aeruginosa NOT  DETECTED NOT DETECTED Final   Stenotrophomonas maltophilia NOT DETECTED NOT DETECTED Final   Candida albicans NOT DETECTED NOT DETECTED Final   Candida auris NOT DETECTED NOT DETECTED Final   Candida glabrata NOT DETECTED NOT DETECTED Final   Candida krusei NOT DETECTED NOT DETECTED Final   Candida parapsilosis NOT DETECTED NOT DETECTED Final   Candida tropicalis NOT DETECTED NOT DETECTED Final   Cryptococcus neoformans/gattii NOT DETECTED NOT DETECTED Final    Comment: Performed at [facility], [address], [place], [place] [address]  Culture, blood (single)     Status: None   Collection Time: 12/05/20  1:30 AM   Specimen: BLOOD  Result Value Ref Range Status   Specimen Description BLOOD SITE NOT SPECIFIED  Final   Special Requests   Final    BOTTLES DRAWN AEROBIC AND ANAEROBIC Blood Culture adequate volume   Culture   Final    NO GROWTH 5 DAYS Performed at [facility], [address], [place],  [address]    Report Status 12/10/2020 FINAL  Final  Culture, blood (Routine X 2) w Reflex to ID Panel     Status: None   Collection Time: 12/07/20  1:02 AM   Specimen: BLOOD  Result Value Ref Range Status   Specimen Description BLOOD RIGHT  HAND  Final   Special Requests   Final    BOTTLES DRAWN AEROBIC ONLY Blood Culture results may not be optimal due to an inadequate volume of blood received in culture bottles   Culture   Final    NO GROWTH 5 DAYS Performed at [facility], [place] [address] [place], [place] [address]    Report Status 12/12/2020 FINAL  Final  Culture, blood (Routine X 2) w Reflex to ID Panel     Status: None   Collection Time: 12/07/20 10:22 AM   Specimen: BLOOD RIGHT HAND  Result Value Ref Range Status   Specimen Description BLOOD RIGHT HAND  Final   Special Requests   Final    BOTTLES DRAWN AEROBIC ONLY Blood Culture results may not be optimal due to an inadequate volume of blood received in culture bottles   Culture   Final    NO  GROWTH 5 DAYS Performed at [facility], [place] [address] [place], [place] [address]    Report Status 12/12/2020 FINAL  Final  Surgical pcr screen     Status: Abnormal   Collection Time: 12/10/20  1:00 PM   Specimen: Nasal Mucosa; Nasal Swab  Result Value Ref Range Status   MRSA, PCR POSITIVE (A) NEGATIVE Final    Comment: RESULT CALLED TO, READ BACK BY AND VERIFIED WITH: Kathaleen Grinder RN [id number] 12/10/20 A BROWNING    Staphylococcus aureus POSITIVE (A) NEGATIVE Final    Comment: (NOTE) The Xpert SA Assay (FDA approved for NASAL specimens in patients 44 years of age and older), is one component of a comprehensive surveillance program. It is not intended to diagnose infection nor to guide or monitor treatment. Performed at [facility], [place] [address], [place], [place] [address]     Studies/Results: No results found.    Assessment/Plan:  INTERVAL HISTORY: Had some swelling that was worse in her left upper extremity in the last few days but now is going down again  Principal Problem:   Acute deep vein thrombosis (DVT) of left upper extremity (HCC) Active Problems:   Moderate cocaine use disorder (HCC)   IVDU (intravenous drug user)   Hepatitis C   Sepsis ([place])   Opioid use disorder   Thrombophlebitis   Hyponatremia   Pneumonia   Deep vein thrombosis (DVT) of left upper extremity, unspecified chronicity, unspecified vein (HCC)   Endocarditis of mitral valve   Endocarditis of tricuspid valve   Polysubstance abuse ([place])   Streptococcal bacteremia   Malnutrition of moderate degree    Summer Cain is a 35 y.o. 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  #1  Group A streptococcus bacteremia with tricuspid valve and mitral valve endocarditis with septic emboli to the lungs and there was  concern for aortic root abscess  She has now had transesophageal echocardiogram which failed to show evidence of an aortic root abscess but does show evidence of both tricuspid valve and mitral valve endocarditis  We will plan on continuing penicillin intravenously through December 15 versus December the 28th (4 vs 6 weeks)  IF she wishes to leave before December 28th she is to let us know [place] so that we can procure oral amoxicillin to treat her to complete a total of 6 weeks of therapy in entirety.  #2  Chronic hepatitis C without hepatic coma: She has hepatitis C genotype Ia with 9 million 8/20,000 copies.  I will plan on treating her weeks here in clinic  #3 lack of immunity to hepatitis B she has received 1 dose of hepatitis B vaccine she will also need to be vaccinated against hepatitis A  #4 IV drug use she is interested in being on methadone versus Suboxone at discharge  #5 Deep venous thrombosis likely septic thrombophlebitis from IV drug use on antibiotics and anticoagulation.  #6 Need for PrEP: I will start her on Truvada at DC and ask Ileene Patrick from clinic to start process of obtaining Apretude IM for PrEP   I spent 65minutes with the patient including  face to face counseling of the patient regarding her group A streptococcus bacteremia and tricuspid valve endocarditis with septic emboli, her chronic hepatitis C without hepatic coma her IV drug use and opiate addiction or deep venous thrombosis or need for preexposure prophylaxis along with personally reviewing radiographs from last 48 hours as well as CBC CMP with review of medical records in preparation for the visit and during the visit and in coordination of her care.   I am making her an appointment for her to follow-up with [place] in clinic.   Idola Garfinkel has an appointment on 01/22/2020 at 73PM with Dr. Candiss Norse  [facility] for Infectious Disease is located in the [facility] at  [place] in [place].  [address], which is located to the left of the elevators.  Phone: [phone]  Fax: [phone]  [email or web address]  She should arrive 15 to 30 minutes prior to her appointment.   Please call us as she nears discharge whether that be before December 15 of the 28th.  Otherwise I will take her off of our rounding list.    LOS: 13 days   Alcide Evener 12/18/2020, 12:22 PM

## 2020-12-18 NOTE — Progress Notes (Addendum)
PROGRESS NOTE    Shanin Szymanowski  PPI:[id number] DOB: 19-May-1985 DOA: 12/04/2020 PCP: Patient, No Pcp Per (Inactive)    Brief Narrative:  Mrs. Knotts was admitted to the hospital with the working diagnosis of left upper extremity deep vein thrombosis, complicated with cellulitis/thrombophlebitis, strep pyogenes bacteremia, endocarditis (tricuspid/ mitral/ pulmonic valve vegetations) Sepsis present on admission.    35 yo female with the past medical history of IV drug abuse, hepatitis C who presented with left arm erythema, edema and tenderness. Reported using Iv fentanyl an smoking cocaine several days before developing her symptoms.  Also complained of pleuritic chest pain, dyspnea and hemoptysis. On her initial physical examination her blood pressure was 98/57, heart rate 78, respirate 22, oxygen saturation 91%, her lungs were clear to auscultation bilaterally, heart S1-S2, present, rhythmic, tachycardic, abdomen soft nontender, left upper extremity with edema, erythema and increased local temperature.  Crusted lesions involving bilateral extremities.   Sodium 127, potassium 4.0, chloride 91, bicarb 25, glucose 115, BUN 14, creatinine 0.72, AST 20, ALT 17, white count 15.9, hemoglobin 11.0, hematocrit 32.8, platelets 181. SARS COVID-19 negative.   Chest radiograph no infiltrates.  Positive scoliosis.   CT chest no pulm embolism.  Positive cavitary lesion in the basilar lingula.   Left upper extremity CT with long segment venous thrombus extending into the left axillary vein at the junction of the subclavian vein consistent with the vein thrombosis.  Associated inflammatory changes indicative of thrombophlebitis.   Patient was placed on broad-spectrum antibiotic therapy. Blood cultures were positive for Streptococcus pyogenes. Echocardiography with tricuspid, mitral and pulmonic valve vegetations. (No aortic root abscess)    Further work-up with MRI cervical spine and thoracic spine with  no evidence of discitis, osteomyelitis or abscess.   Plan to complete at least 4 weeks of IV antibiotic therapy.    Assessment & Plan:   Principal Problem:   Acute deep vein thrombosis (DVT) of left upper extremity (HCC) Active Problems:   Moderate cocaine use disorder (HCC)   IVDU (intravenous drug user)   Hepatitis C   Sepsis (HCC)   Opioid use disorder   Thrombophlebitis   Hyponatremia   Pneumonia   Deep vein thrombosis (DVT) of left upper extremity, unspecified chronicity, unspecified vein (HCC)   Endocarditis of mitral valve   Endocarditis of tricuspid valve   Polysubstance abuse (HCC)   Streptococcal bacteremia   Malnutrition of moderate degree   Left upper extremity cellulitis/ thrombophlebitis, complicated with tricuspid, mitral and pulmonary valve endocarditis, pulmonary septic embolic phenomena and sepsis (present on admission).   Blood cultures from 11/20 with no growth. Blood culture from 11/17 positive for group A streptococcus.    Antibiotic therapy with IV penicillin complete 6 weeks on December 28, if she leaves at 4 weeks (December 15), will need oral antibiotic therapy with amoxicillin to complete 6 weeks,  Pain control with methadone and oxycodone.  Continue telemetry monitoring therapy for now.  Continue anticoagulation with apixaban.    2. Hyponatremia, hypocalcemia, and hyperkalemia   Patient is tolerating po well and renal function is preserved with serum cr at 0,88.  Na is 133 and K is 5,3 with serum bicarbonate at 29.  Patient is not using ibuprofen.  If blood sample is taken from same extremity from penicillin infusion K will be false elevated.  Will recommend to take blood sample from opposite site from penicillin infusion or if is from picc line, to follow protocol to avoid incorrect sampling.    3. Hepatitis C Had  hep B vaccination  Follow as outpatient    4. IV drug abuse/ insomnia/ moderate calorie protein malnutrition. On methadone. Not  candidate for outpatient IV antibiotic therapy. Continue with  trazodone to 75 mg at night.  Continue with nutritional supplements.    5. Reactive hyperglycemia/ false hyperglycemia. Fasting glucose this am 256. Will recommend capillary glucose check, this can be also a false elevation due to sampling from same arm where penicillin infusion.   Patient continue to be at high risk for worsening endocarditis   Status is: Inpatient  Remains inpatient appropriate because: Iv antibiotic therapy    DVT prophylaxis: Apixaban   Code Status:    full  Family Communication:   No family at the bedside      Nutrition Status: Nutrition Problem: Moderate Malnutrition Etiology: social / environmental circumstances, acute illness (IVDU, endocarditis) Signs/Symptoms: mild muscle depletion, mild fat depletion Interventions: Ensure Enlive (each supplement provides 350kcal and 20 grams of protein), MVI   Consultants:   ID   Procedures:  TEE   Antimicrobials:  Penicillin     Subjective:  Patient with no nausea or vomiting, chest pain and left arm pain have been improving no nausea or vomiting,  Objective: Vitals:   12/17/20 2010 12/17/20 2330 12/18/20 0418 12/18/20 1205  BP: 106/61 (!) 107/55 (!) 118/57 (!) 106/56  Pulse:  67 76 70  Resp:  18 18 19   Temp:  98.7 F (37.1 C) 98.4 F (36.9 C) 98.2 F (36.8 C)  TempSrc:  Oral Oral Oral  SpO2:  97% 98% 98%  Weight:   53.3 kg   Height:        Intake/Output Summary (Last 24 hours) at 12/18/2020 1303 Last data filed at 12/18/2020 1200 Gross per 24 hour  Intake 1693.94 ml  Output --  Net 1693.94 ml   Filed Weights   12/16/20 0343 12/17/20 0451 12/18/20 0418  Weight: 55.8 kg 55.8 kg 53.3 kg    Examination:   General: Not in pain or dyspnea.  Neurology: Awake and alert, non focal  E ENT: no pallor, no icterus, oral mucosa moist Cardiovascular: No JVD. S1-S2 present, rhythmic, no gallops, rubs, or murmurs. No lower extremity  edema. Pulmonary: vesicular breath sounds bilaterally, adequate air movement, no wheezing, rhonchi or rales. Gastrointestinal. Abdomen soft and non tender Skin. No rashes Musculoskeletal: indurated left upper extremity     Data Reviewed: I have personally reviewed following labs and imaging studies  CBC: Recent Labs  Lab 12/14/20 0155 12/15/20 0500 12/16/20 0427 12/17/20 0430 12/18/20 0459  WBC 7.4 6.4 4.9 4.7 4.2  NEUTROABS 3.2 2.7 2.0 1.7 1.5*  HGB 11.3* 11.4* 10.0* 10.2* 10.2*  HCT 36.2 36.3 32.4* 32.4* 32.5*  MCV 88.5 88.8 88.8 89.8 90.3  PLT 393 392 351 323 [id number]   Basic Metabolic Panel: Recent Labs  Lab 12/13/20 0500 12/14/20 0155 12/15/20 0500 12/16/20 0427 12/16/20 1247 12/17/20 0430 12/18/20 0459  NA 136 135 136 132*  --  131* 133*  K 4.1 4.5 4.7 6.2* 3.9 5.2* 5.3*  CL 98 98 99 97*  --  97* 95*  CO2 33* 31 31 30   --  29 29  GLUCOSE 95 99 84 285*  --  217* 256*  BUN 15 14 17 14   --  10 11  CREATININE 0.84 0.81 0.94 0.81  --  0.70 0.88  CALCIUM 8.5* 8.6* 8.8* 8.3*  --  8.4* 8.5*  MG 1.9 2.2 2.0 1.9 2.0 1.9  --   PHOS  5.3* 5.3* 4.6 4.0  --  4.8*  --    GFR: Estimated Creatinine Clearance: 65.3 mL/min (by C-G formula based on SCr of 0.88 mg/dL). Liver Function Tests: Recent Labs  Lab 12/13/20 0500 12/14/20 0155 12/15/20 0500 12/16/20 0427 12/17/20 0430  AST 40 36 26 21 25   ALT 36 41 37 28 29  ALKPHOS 84 98 99 91 88  BILITOT 0.4 0.2* 0.5 0.4 0.5  PROT 5.5* 6.0* 6.4* 5.5* 5.7*  ALBUMIN 1.9* 2.2* 2.4* 2.1* 2.2*   No results for input(s): LIPASE, AMYLASE in the last 168 hours. No results for input(s): AMMONIA in the last 168 hours. Coagulation Profile: No results for input(s): INR, PROTIME in the last 168 hours. Cardiac Enzymes: No results for input(s): CKTOTAL, CKMB, CKMBINDEX, TROPONINI in the last 168 hours. BNP (last 3 results) No results for input(s): PROBNP in the last 8760 hours. HbA1C: No results for input(s): HGBA1C in the last 72  hours. CBG: No results for input(s): GLUCAP in the last 168 hours. Lipid Profile: No results for input(s): CHOL, HDL, LDLCALC, TRIG, CHOLHDL, LDLDIRECT in the last 72 hours. Thyroid Function Tests: No results for input(s): TSH, T4TOTAL, FREET4, T3FREE, THYROIDAB in the last 72 hours. Anemia Panel: No results for input(s): VITAMINB12, FOLATE, FERRITIN, TIBC, IRON, RETICCTPCT in the last 72 hours.    Radiology Studies: I have reviewed all of the imaging during this hospital visit personally     Scheduled Meds:  apixaban  5 mg Oral BID   Chlorhexidine Gluconate Cloth  6 each Topical Daily   COVID-19 mRNA bivalent vaccine (Pfizer)  0.3 mL Intramuscular Once   divalproex  250 mg Oral Q12H   feeding supplement  237 mL Oral BID BM   methadone  20 mg Oral Daily   multivitamin with minerals  1 tablet Oral Daily   pantoprazole  40 mg Oral Daily   polyethylene glycol  17 g Oral Daily   sodium chloride flush  10-40 mL Intracatheter Q12H   traZODone  75 mg Oral QHS   Continuous Infusions:  penicillin g continuous IV infusion 12 Million Units (12/18/20 0342)     LOS: 13 days        Arriah Wadle Gerome Apley, MD

## 2020-12-18 NOTE — Telephone Encounter (Signed)
Clearance Coots is working on [employer] and exercise psychologist for PrEP. I am sending to [facility] specialty pharmacy so can be shipped to [facility] where she can have her first shot at visit in early January

## 2020-12-19 DIAGNOSIS — B182 Chronic viral hepatitis C: Secondary | ICD-10-CM | POA: Diagnosis not present

## 2020-12-19 DIAGNOSIS — I059 Rheumatic mitral valve disease, unspecified: Secondary | ICD-10-CM | POA: Diagnosis not present

## 2020-12-19 DIAGNOSIS — I079 Rheumatic tricuspid valve disease, unspecified: Secondary | ICD-10-CM | POA: Diagnosis not present

## 2020-12-19 DIAGNOSIS — I82622 Acute embolism and thrombosis of deep veins of left upper extremity: Secondary | ICD-10-CM | POA: Diagnosis not present

## 2020-12-19 LAB — BASIC METABOLIC PANEL
Anion gap: 5 (ref 5–15)
BUN: 11 mg/dL (ref 6–20)
CO2: 31 mmol/L (ref 22–32)
Calcium: 8.8 mg/dL — ABNORMAL LOW (ref 8.9–10.3)
Chloride: 101 mmol/L (ref 98–111)
Creatinine, Ser: 0.72 mg/dL (ref 0.44–1.00)
GFR, Estimated: >60 mL/min (ref >60)
Glucose, Bld: 95 mg/dL (ref 70–99)
Potassium: 4 mmol/L (ref 3.5–5.1)
Sodium: 137 mmol/L (ref 135–145)

## 2020-12-19 MED ORDER — DIPHENHYDRAMINE HCL 25 MG PO CAPS
50.0000 mg | ORAL_CAPSULE | Freq: Four times a day (QID) | ORAL | Status: DC | PRN
  Administered 2020-12-19 – 2020-12-20 (×3): 50 mg via ORAL
  Filled 2020-12-19 (×3): qty 2

## 2020-12-19 NOTE — Progress Notes (Addendum)
PROGRESS NOTE    Summer Cain  [id number] DOB: [birth year] DOA: 12/04/2020 PCP: Patient, No Pcp Per (Inactive)    Brief Narrative:  Summer Cain was admitted to the hospital with the working diagnosis of left upper extremity deep vein thrombosis, complicated with cellulitis/thrombophlebitis, strep pyogenes bacteremia, endocarditis (tricuspid/ mitral/ pulmonic valve vegetations) Sepsis present on admission.    35 yo female with the past medical history of IV drug abuse, hepatitis C who presented with left arm erythema, edema and tenderness. Reported using Iv fentanyl an smoking cocaine several days before developing her symptoms.  Also complained of pleuritic chest pain, dyspnea and hemoptysis. On her initial physical examination her blood pressure was 98/57, heart rate 78, respirate 22, oxygen saturation 91%, her lungs were clear to auscultation bilaterally, heart S1-S2, present, rhythmic, tachycardic, abdomen soft nontender, left upper extremity with edema, erythema and increased local temperature.  Crusted lesions involving bilateral extremities.   Sodium 127, potassium 4.0, chloride 91, bicarb 25, glucose 115, BUN 14, creatinine 0.72, AST 20, ALT 17, white count 15.9, hemoglobin 11.0, hematocrit 32.8, platelets 181. SARS COVID-19 negative.   Chest radiograph no infiltrates.  Positive scoliosis.   CT chest no pulm embolism.  Positive cavitary lesion in the basilar lingula.   Left upper extremity CT with long segment venous thrombus extending into the left axillary vein at the junction of the subclavian vein consistent with the vein thrombosis.  Associated inflammatory changes indicative of thrombophlebitis.   Patient was placed on broad-spectrum antibiotic therapy. Blood cultures were positive for Streptococcus pyogenes. Echocardiography with tricuspid, mitral and pulmonic valve vegetations. (No aortic root abscess)    Further work-up with MRI cervical spine and thoracic spine with  no evidence of discitis, osteomyelitis or abscess.   Plan to complete at least 4 weeks of IV antibiotic therapy, ideally 6 weeks complete 01/14/21      Assessment & Plan:   Principal Problem:   Acute deep vein thrombosis (DVT) of left upper extremity (HCC) Active Problems:   Moderate cocaine use disorder (HCC)   IVDU (intravenous drug user)   Hepatitis C   Sepsis ([place])   Opioid use disorder   Thrombophlebitis   Hyponatremia   Pneumonia   Deep vein thrombosis (DVT) of left upper extremity, unspecified chronicity, unspecified vein (HCC)   Endocarditis of mitral valve   Endocarditis of tricuspid valve   Polysubstance abuse (HCC)   Streptococcal bacteremia   Malnutrition of moderate degree     Left upper extremity cellulitis/ thrombophlebitis, complicated with tricuspid, mitral and pulmonary valve endocarditis, pulmonary septic embolic phenomena and sepsis (present on admission).   Blood cultures from 11/20 with no growth. Blood culture from 11/17 positive for group A streptococcus.    Antibiotic therapy with IV penicillin complete 6 weeks on December 28, if she leaves at 4 weeks (December 15), will need oral antibiotic therapy with amoxicillin to complete 6 weeks,   Continue with methadone and oxycodone.  Anticoagulation with apixaban.   Will discontinue telemetry monitoring.    2. Hyponatremia, hypocalcemia, and pseudo hyperkalemia   This am sample from PICC line with protocol to prevent contamination with IV penicillin infusion. Normal K and normal glucose.   Follow BMP as needed using protocol to prevent contamination with antibiotic infusion.    3. Hepatitis C Had hep B vaccination  Follow as outpatient    4. IV drug abuse/ insomnia/ moderate calorie protein malnutrition. Continue with methadone and oxycodone. As needed ibuprofen.  Tolerating well trazodone to 75 mg at  night.  Continue with nutritional supplements.   Continue with as needed diphenhydramine for  itching, Ok to increase dose.   Continue with divalproex    5. Pseudo hyperglycemia.  Today sample with no contamination from IV penicillin infusion with normal glucose   6. Anemia of chronic disease. Hgb is 10,2 and Hct 32,5 Plan to continue close follow up as needed.   Status is: Inpatient  Remains inpatient appropriate because: continue IV antibiotic therapy.   DVT prophylaxis: apixaban   Code Status:    full  Family Communication:   No family at the bedside      Nutrition Status: Nutrition Problem: Moderate Malnutrition Etiology: social / environmental circumstances, acute illness (IVDU, endocarditis) Signs/Symptoms: mild muscle depletion, mild fat depletion Interventions: Ensure Enlive (each supplement provides 350kcal and 20 grams of protein), MVI     Consultants:  ID   Procedures:  TEE  Antimicrobials:  Penicillin     Subjective: Patient is feeling well, no nausea or vomiting, her chest pain and left upper extremity pain is controlled.  Intermittent generalized pruritus   Objective: Vitals:   12/19/20 0500 12/19/20 0505 12/19/20 0800 12/19/20 1209  BP:  (!) 107/51 (!) 120/47 140/64  Pulse:  71 70 76  Resp:  16 16 18   Temp:  (!) 97.5 F (36.4 C) 97.7 F (36.5 C) 97.7 F (36.5 C)  TempSrc:  Oral Oral Oral  SpO2:  97% 98% 98%  Weight: 53.4 kg     Height:        Intake/Output Summary (Last 24 hours) at 12/19/2020 1422 Last data filed at 12/19/2020 0900 Gross per 24 hour  Intake 1510.6 ml  Output 1900 ml  Net -389.4 ml   Filed Weights   12/18/20 0418 12/18/20 1658 12/19/20 0500  Weight: 53.3 kg 53.8 kg 53.4 kg    Examination:   General: Not in pain or dyspnea,  Neurology: Awake and alert, non focal  E ENT: no pallor, no icterus, oral mucosa moist Cardiovascular: No JVD. S1-S2 present, rhythmic, no gallops, rubs, or murmurs.  Mild left upper extremity edema,  Pulmonary: positive breath sounds bilaterally, adequate air movement, no wheezing,  rhonchi or rales. Gastrointestinal. Abdomen soft and non tender Skin. No rashes Musculoskeletal: no joint deformities     Data Reviewed: I have personally reviewed following labs and imaging studies  CBC: Recent Labs  Lab 12/14/20 0155 12/15/20 0500 12/16/20 0427 12/17/20 0430 12/18/20 0459  WBC 7.4 6.4 4.9 4.7 4.2  NEUTROABS 3.2 2.7 2.0 1.7 1.5*  HGB 11.3* 11.4* 10.0* 10.2* 10.2*  HCT 36.2 36.3 32.4* 32.4* 32.5*  MCV 88.5 88.8 88.8 89.8 90.3  PLT 393 392 351 323 [id number]   Basic Metabolic Panel: Recent Labs  Lab 12/13/20 0500 12/14/20 0155 12/15/20 0500 12/16/20 0427 12/16/20 1247 12/17/20 0430 12/18/20 0459 12/19/20 0500  NA 136 135 136 132*  --  131* 133* 137  K 4.1 4.5 4.7 6.2* 3.9 5.2* 5.3* 4.0  CL 98 98 99 97*  --  97* 95* 101  CO2 33* 31 31 30   --  29 29 31   GLUCOSE 95 99 84 285*  --  217* 256* 95  BUN 15 14 17 14   --  10 11 11   CREATININE 0.84 0.81 0.94 0.81  --  0.70 0.88 0.72  CALCIUM 8.5* 8.6* 8.8* 8.3*  --  8.4* 8.5* 8.8*  MG 1.9 2.2 2.0 1.9 2.0 1.9  --   --   PHOS 5.3* 5.3* 4.6 4.0  --  4.8*  --   --    GFR: Estimated Creatinine Clearance: 71.8 mL/min (by C-G formula based on SCr of 0.72 mg/dL). Liver Function Tests: Recent Labs  Lab 12/13/20 0500 12/14/20 0155 12/15/20 0500 12/16/20 0427 12/17/20 0430  AST 40 36 26 21 25   ALT 36 41 37 28 29  ALKPHOS 84 98 99 91 88  BILITOT 0.4 0.2* 0.5 0.4 0.5  PROT 5.5* 6.0* 6.4* 5.5* 5.7*  ALBUMIN 1.9* 2.2* 2.4* 2.1* 2.2*   No results for input(s): LIPASE, AMYLASE in the last 168 hours. No results for input(s): AMMONIA in the last 168 hours. Coagulation Profile: No results for input(s): INR, PROTIME in the last 168 hours. Cardiac Enzymes: No results for input(s): CKTOTAL, CKMB, CKMBINDEX, TROPONINI in the last 168 hours. BNP (last 3 results) No results for input(s): PROBNP in the last 8760 hours. HbA1C: No results for input(s): HGBA1C in the last 72 hours. CBG: No results for input(s): GLUCAP in  the last 168 hours. Lipid Profile: No results for input(s): CHOL, HDL, LDLCALC, TRIG, CHOLHDL, LDLDIRECT in the last 72 hours. Thyroid Function Tests: No results for input(s): TSH, T4TOTAL, FREET4, T3FREE, THYROIDAB in the last 72 hours. Anemia Panel: No results for input(s): VITAMINB12, FOLATE, FERRITIN, TIBC, IRON, RETICCTPCT in the last 72 hours.    Radiology Studies: I have reviewed all of the imaging during this hospital visit personally     Scheduled Meds:  apixaban  5 mg Oral BID   Chlorhexidine Gluconate Cloth  6 each Topical Daily   COVID-19 mRNA bivalent vaccine (Pfizer)  0.3 mL Intramuscular Once   divalproex  250 mg Oral Q12H   feeding supplement  237 mL Oral BID BM   methadone  20 mg Oral Daily   multivitamin with minerals  1 tablet Oral Daily   pantoprazole  40 mg Oral Daily   polyethylene glycol  17 g Oral Daily   sodium chloride flush  10-40 mL Intracatheter Q12H   traZODone  75 mg Oral QHS   Continuous Infusions:  penicillin g continuous IV infusion 12 Million Units (12/19/20 1315)     LOS: 14 days        Crescent Gotham 14/02/22, MD

## 2020-12-20 DIAGNOSIS — I82622 Acute embolism and thrombosis of deep veins of left upper extremity: Secondary | ICD-10-CM | POA: Diagnosis not present

## 2020-12-20 DIAGNOSIS — B182 Chronic viral hepatitis C: Secondary | ICD-10-CM | POA: Diagnosis not present

## 2020-12-20 DIAGNOSIS — I079 Rheumatic tricuspid valve disease, unspecified: Secondary | ICD-10-CM | POA: Diagnosis not present

## 2020-12-20 DIAGNOSIS — I059 Rheumatic mitral valve disease, unspecified: Secondary | ICD-10-CM | POA: Diagnosis not present

## 2020-12-20 MED ORDER — DIPHENHYDRAMINE HCL 25 MG PO CAPS
50.0000 mg | ORAL_CAPSULE | Freq: Three times a day (TID) | ORAL | Status: DC | PRN
  Administered 2020-12-20 – 2020-12-24 (×8): 50 mg via ORAL
  Filled 2020-12-20 (×9): qty 2

## 2020-12-20 NOTE — Progress Notes (Signed)
PROGRESS NOTE    Summer Cain  [id number] DOB: 08-28-1985 DOA: 12/04/2020 PCP: Patient, No Pcp Per (Inactive)    Brief Narrative:  Summer Cain was admitted to the hospital with the working diagnosis of left upper extremity deep vein thrombosis, complicated with cellulitis/thrombophlebitis, strep pyogenes bacteremia, endocarditis (tricuspid/ mitral/ pulmonic valve vegetations) Sepsis present on admission.    35 yo female with the past medical history of IV drug abuse, hepatitis C who presented with left arm erythema, edema and tenderness. Reported using Iv fentanyl an smoking cocaine several days before developing her symptoms.  Also complained of pleuritic chest pain, dyspnea and hemoptysis. On her initial physical examination her blood pressure was 98/57, heart rate 78, respirate 22, oxygen saturation 91%, her lungs were clear to auscultation bilaterally, heart S1-S2, present, rhythmic, tachycardic, abdomen soft nontender, left upper extremity with edema, erythema and increased local temperature.  Crusted lesions involving bilateral extremities.   Sodium 127, potassium 4.0, chloride 91, bicarb 25, glucose 115, BUN 14, creatinine 0.72, AST 20, ALT 17, white count 15.9, hemoglobin 11.0, hematocrit 32.8, platelets 181. SARS COVID-19 negative.   Chest radiograph no infiltrates.  Positive scoliosis.   CT chest no pulm embolism.  Positive cavitary lesion in the basilar lingula.   Left upper extremity CT with long segment venous thrombus extending into the left axillary vein at the junction of the subclavian vein consistent with the vein thrombosis.  Associated inflammatory changes indicative of thrombophlebitis.   Patient was placed on broad-spectrum antibiotic therapy. Blood cultures were positive for Streptococcus pyogenes. Echocardiography with tricuspid, mitral and pulmonic valve vegetations. (No aortic root abscess)    Further work-up with MRI cervical spine and thoracic spine with  no evidence of discitis, osteomyelitis or abscess.   Plan to complete at least 4 weeks of IV antibiotic therapy, ideally 6 weeks complete 01/14/21      Assessment & Plan:   Principal Problem:   Acute deep vein thrombosis (DVT) of left upper extremity (HCC) Active Problems:   Moderate cocaine use disorder (HCC)   IVDU (intravenous drug user)   Hepatitis C   Sepsis ([place])   Opioid use disorder   Thrombophlebitis   Hyponatremia   Pneumonia   Deep vein thrombosis (DVT) of left upper extremity, unspecified chronicity, unspecified vein (HCC)   Endocarditis of mitral valve   Endocarditis of tricuspid valve   Polysubstance abuse (HCC)   Streptococcal bacteremia   Malnutrition of moderate degree     Left upper extremity cellulitis/ thrombophlebitis, complicated with tricuspid, mitral and pulmonary valve endocarditis, pulmonary septic embolic phenomena and sepsis (present on admission).   Blood cultures from 11/20 with no growth. Blood culture from 11/17 positive for group A streptococcus.    Antibiotic therapy with IV penicillin complete 6 weeks on December 28, if she leaves at 4 weeks (December 15), will need oral antibiotic therapy with amoxicillin to complete 6 weeks,   Tolerating well methadone and oxycodone.  Continue anticoagulation with apixaban.    2. Hyponatremia, hypocalcemia, and pseudo hyperkalemia   Normal K and normal glucose.    Follow BMP as needed using protocol to prevent contamination with antibiotic infusion.    3. Hepatitis C Had hep B vaccination  Follow as outpatient    4. IV drug abuse/ insomnia/ moderate calorie protein malnutrition. On methadone and oxycodone, plus as needed ibuprofen.  On trazodone to 75 mg at night.  Nutritional supplements.   Divalproex and as needed diphenhydramine, will decrease frequency to prevent oversedation  5. Pseudo hyperglycemia.     6. Anemia of chronic disease.  Stable cell counts.    Status is:  Inpatient  Remains inpatient appropriate because: IV antibiotic therapy  DVT prophylaxis: Apixaban   Code Status:    full  Family Communication:   No family at the bedside      Nutrition Status: Nutrition Problem: Moderate Malnutrition Etiology: social / environmental circumstances, acute illness (IVDU, endocarditis) Signs/Symptoms: mild muscle depletion, mild fat depletion Interventions: Ensure Enlive (each supplement provides 350kcal and 20 grams of protein), MVI   Consultants:  ID Cardiology    Antimicrobials:  Penicillin     Subjective: Patient with somnolence this am, but able to answer questions and follow commands, no  nausea or vomiting, she is tolerating po well   Objective: Vitals:   12/19/20 2110 12/20/20 0421 12/20/20 0809 12/20/20 1119  BP:  122/77 (!) 141/47 (!) 118/52  Pulse: 85  80 70  Resp: 18 16 16 17   Temp: 97.6 F (36.4 C) (!) 97.5 F (36.4 C) 98.2 F (36.8 C) 98.3 F (36.8 C)  TempSrc: Oral Oral Oral Oral  SpO2: 99% 100% 100% 100%  Weight:      Height:        Intake/Output Summary (Last 24 hours) at 12/20/2020 1250 Last data filed at 12/20/2020 14/03/2020 Gross per 24 hour  Intake 1019.57 ml  Output --  Net 1019.57 ml   Filed Weights   12/18/20 0418 12/18/20 1658 12/19/20 0500  Weight: 53.3 kg 53.8 kg 53.4 kg    Examination:   General: Not in pain or dyspnea  Neurology: somnolent but easy to arouse.  E ENT: mild pallor, no icterus, oral mucosa moist Cardiovascular: No JVD. S1-S2 present, rhythmic, no gallops, rubs, or murmurs. No lower extremity edema. Pulmonary: positive breath sounds bilaterally, with no wheezing, rhonchi or rales. Gastrointestinal. Abdomen soft and non tender Skin. No rashes Musculoskeletal: no joint deformities     Data Reviewed: I have personally reviewed following labs and imaging studies  CBC: Recent Labs  Lab 12/14/20 0155 12/15/20 0500 12/16/20 0427 12/17/20 0430 12/18/20 0459  WBC 7.4 6.4 4.9 4.7  4.2  NEUTROABS 3.2 2.7 2.0 1.7 1.5*  HGB 11.3* 11.4* 10.0* 10.2* 10.2*  HCT 36.2 36.3 32.4* 32.4* 32.5*  MCV 88.5 88.8 88.8 89.8 90.3  PLT 393 392 351 323 302   Basic Metabolic Panel: Recent Labs  Lab 12/14/20 0155 12/15/20 0500 12/16/20 0427 12/16/20 1247 12/17/20 0430 12/18/20 0459 12/19/20 0500  NA 135 136 132*  --  131* 133* 137  K 4.5 4.7 6.2* 3.9 5.2* 5.3* 4.0  CL 98 99 97*  --  97* 95* 101  CO2 31 31 30   --  29 29 31   GLUCOSE 99 84 285*  --  217* 256* 95  BUN 14 17 14   --  10 11 11   CREATININE 0.81 0.94 0.81  --  0.70 0.88 0.72  CALCIUM 8.6* 8.8* 8.3*  --  8.4* 8.5* 8.8*  MG 2.2 2.0 1.9 2.0 1.9  --   --   PHOS 5.3* 4.6 4.0  --  4.8*  --   --    GFR: Estimated Creatinine Clearance: 71.8 mL/min (by C-G formula based on SCr of 0.72 mg/dL). Liver Function Tests: Recent Labs  Lab 12/14/20 0155 12/15/20 0500 12/16/20 0427 12/17/20 0430  AST 36 26 21 25   ALT 41 37 28 29  ALKPHOS 98 99 91 88  BILITOT 0.2* 0.5 0.4 0.5  PROT 6.0*  6.4* 5.5* 5.7*  ALBUMIN 2.2* 2.4* 2.1* 2.2*   No results for input(s): LIPASE, AMYLASE in the last 168 hours. No results for input(s): AMMONIA in the last 168 hours. Coagulation Profile: No results for input(s): INR, PROTIME in the last 168 hours. Cardiac Enzymes: No results for input(s): CKTOTAL, CKMB, CKMBINDEX, TROPONINI in the last 168 hours. BNP (last 3 results) No results for input(s): PROBNP in the last 8760 hours. HbA1C: No results for input(s): HGBA1C in the last 72 hours. CBG: No results for input(s): GLUCAP in the last 168 hours. Lipid Profile: No results for input(s): CHOL, HDL, LDLCALC, TRIG, CHOLHDL, LDLDIRECT in the last 72 hours. Thyroid Function Tests: No results for input(s): TSH, T4TOTAL, FREET4, T3FREE, THYROIDAB in the last 72 hours. Anemia Panel: No results for input(s): VITAMINB12, FOLATE, FERRITIN, TIBC, IRON, RETICCTPCT in the last 72 hours.    Radiology Studies: I have reviewed all of the imaging during  this hospital visit personally     Scheduled Meds:  apixaban  5 mg Oral BID   Chlorhexidine Gluconate Cloth  6 each Topical Daily   COVID-19 mRNA bivalent vaccine (Pfizer)  0.3 mL Intramuscular Once   divalproex  250 mg Oral Q12H   feeding supplement  237 mL Oral BID BM   methadone  20 mg Oral Daily   multivitamin with minerals  1 tablet Oral Daily   pantoprazole  40 mg Oral Daily   polyethylene glycol  17 g Oral Daily   sodium chloride flush  10-40 mL Intracatheter Q12H   traZODone  75 mg Oral QHS   Continuous Infusions:  penicillin g continuous IV infusion 12 Million Units (12/20/20 0123)     LOS: 15 days        Cathy Ropp Gerome Apley, MD

## 2020-12-21 DIAGNOSIS — I059 Rheumatic mitral valve disease, unspecified: Secondary | ICD-10-CM | POA: Diagnosis not present

## 2020-12-21 DIAGNOSIS — I82622 Acute embolism and thrombosis of deep veins of left upper extremity: Secondary | ICD-10-CM | POA: Diagnosis not present

## 2020-12-21 DIAGNOSIS — I079 Rheumatic tricuspid valve disease, unspecified: Secondary | ICD-10-CM | POA: Diagnosis not present

## 2020-12-21 DIAGNOSIS — B182 Chronic viral hepatitis C: Secondary | ICD-10-CM | POA: Diagnosis not present

## 2020-12-21 MED ORDER — TRAZODONE HCL 50 MG PO TABS
50.0000 mg | ORAL_TABLET | Freq: Every day | ORAL | Status: DC
  Administered 2020-12-21 – 2020-12-25 (×5): 50 mg via ORAL
  Filled 2020-12-21 (×5): qty 1

## 2020-12-21 NOTE — Progress Notes (Signed)
Patient had visitor to room. RN went in and spoke with both patient and visitor and informed them the door must remain open while visitor is here. Visitor was seen with unlit cigarrette in his hand. He was told he can not have cigarrettes out in the room. RN assessed patients IV and nothing appeared tampered with. Patient verbalized understanding of conversation. Brynda Rim, RN

## 2020-12-21 NOTE — Progress Notes (Signed)
PROGRESS NOTE    Summer Cain  PIR:[id number] DOB: 06/04/1985 DOA: 12/04/2020 PCP: Patient, No Pcp Per (Inactive)    Brief Narrative:  Mrs. Bencosme was admitted to the hospital with the working diagnosis of left upper extremity deep vein thrombosis, complicated with cellulitis/thrombophlebitis, strep pyogenes bacteremia, endocarditis (tricuspid/ mitral/ pulmonic valve vegetations) Sepsis present on admission.    35 yo female with the past medical history of IV drug abuse, hepatitis C who presented with left arm erythema, edema and tenderness. Reported using Iv fentanyl an smoking cocaine several days before developing her symptoms.  Also complained of pleuritic chest pain, dyspnea and hemoptysis. On her initial physical examination her blood pressure was 98/57, heart rate 78, respirate 22, oxygen saturation 91%, her lungs were clear to auscultation bilaterally, heart S1-S2, present, rhythmic, tachycardic, abdomen soft nontender, left upper extremity with edema, erythema and increased local temperature.  Crusted lesions involving bilateral extremities.   Sodium 127, potassium 4.0, chloride 91, bicarb 25, glucose 115, BUN 14, creatinine 0.72, AST 20, ALT 17, white count 15.9, hemoglobin 11.0, hematocrit 32.8, platelets 181. SARS COVID-19 negative.   Chest radiograph no infiltrates.  Positive scoliosis.   CT chest no pulm embolism.  Positive cavitary lesion in the basilar lingula.   Left upper extremity CT with long segment venous thrombus extending into the left axillary vein at the junction of the subclavian vein consistent with the vein thrombosis.  Associated inflammatory changes indicative of thrombophlebitis.   Patient was placed on broad-spectrum antibiotic therapy. Blood cultures were positive for Streptococcus pyogenes. Echocardiography with tricuspid, mitral and pulmonic valve vegetations. (No aortic root abscess)    Further work-up with MRI cervical spine and thoracic spine with  no evidence of discitis, osteomyelitis or abscess.   Plan to complete at least 4 weeks of IV antibiotic therapy (December 15), ideally 6 weeks to complete on 01/14/21   12/03 night patient had a visitor and then she developed "strange behavior" Continue with tele-sitter monitoring.   Assessment & Plan:   Principal Problem:   Acute deep vein thrombosis (DVT) of left upper extremity (HCC) Active Problems:   Moderate cocaine use disorder (HCC)   IVDU (intravenous drug user)   Hepatitis C   Sepsis (HCC)   Opioid use disorder   Thrombophlebitis   Hyponatremia   Pneumonia   Deep vein thrombosis (DVT) of left upper extremity, unspecified chronicity, unspecified vein (HCC)   Endocarditis of mitral valve   Endocarditis of tricuspid valve   Polysubstance abuse (HCC)   Streptococcal bacteremia   Malnutrition of moderate degree     Left upper extremity cellulitis/ thrombophlebitis, complicated with tricuspid, mitral and pulmonary valve endocarditis, pulmonary septic embolic phenomena and sepsis (present on admission).   Blood cultures from 11/20 with no growth. Blood culture from 11/17 positive for group A streptococcus.    Antibiotic therapy with IV penicillin complete 6 weeks on December 28, if she leaves at 4 weeks (December 15), will need oral antibiotic therapy with amoxicillin to complete 6 weeks,   Continue with methadone and oxycodone.  Decreased frequency of diphenhydramine to every 8 hrs as needed.  Anticoagulation with apixaban.    2. Hyponatremia, hypocalcemia, and pseudo hyperkalemia   Normal K and normal glucose.    Follow BMP as needed using protocol to prevent contamination with antibiotic infusion.    3. Hepatitis C Had hep B vaccination  Follow as outpatient    4. IV drug abuse/ insomnia/ moderate calorie protein malnutrition. Patient with strange behavior last night  after a visitor in her room.   This am she is somnolent.   Plan to continue with methadone and  oxycodone. PRN ibuprofen.  Continue with diphenhydramine only every 8 hrs as needed  Decreased trazodone back to 50 mg at night.  Continue with depakote  Close monitoring with tele-sitter. Needs close 1:1 observation when she gets visitors. (Discussed with nursing today).    5. Pseudo hyperglycemia.     6. Anemia of chronic disease.  Stable cell counts.    Status is: Inpatient  Remains inpatient appropriate because: IV antibiotic therapy    DVT prophylaxis:  Apixaban   Code Status:    full  Family Communication:   No family at the bedside      Nutrition Status: Nutrition Problem: Moderate Malnutrition Etiology: social / environmental circumstances, acute illness (IVDU, endocarditis) Signs/Symptoms: mild muscle depletion, mild fat depletion Interventions: Ensure Enlive (each supplement provides 350kcal and 20 grams of protein), MVI     Consultants:  ID   Procedures:  TEE  Antimicrobials:  Penicillin     Subjective: Patient somnolent this am, easy to arouse. No pain or dyspnea.,   Objective: Vitals:   12/20/20 2302 12/21/20 0100 12/21/20 0316 12/21/20 0700  BP: 115/69  123/62 116/74  Pulse: 62  96 79  Resp: 17  18   Temp: 98.2 F (36.8 C)  97.7 F (36.5 C) 98.3 F (36.8 C)  TempSrc: Oral  Oral Oral  SpO2: 100%  95% 98%  Weight:  51 kg    Height:        Intake/Output Summary (Last 24 hours) at 12/21/2020 1025 Last data filed at 12/21/2020 0401 Gross per 24 hour  Intake 1379.34 ml  Output 0 ml  Net 1379.34 ml   Filed Weights   12/18/20 1658 12/19/20 0500 12/21/20 0100  Weight: 53.8 kg 53.4 kg 51 kg    Examination:   General: Not in pain or dyspnea,  Neurology: somnolent but easy to arouse.  E ENT: no pallor, no icterus, oral mucosa moist Cardiovascular: No JVD. S1-S2 present. No lower extremity edema. Pulmonary: positive breath sounds bilaterally, Gastrointestinal. Abdomen soft  Skin. No rashes Musculoskeletal: no joint  deformities     Data Reviewed: I have personally reviewed following labs and imaging studies  CBC: Recent Labs  Lab 12/15/20 0500 12/16/20 0427 12/17/20 0430 12/18/20 0459  WBC 6.4 4.9 4.7 4.2  NEUTROABS 2.7 2.0 1.7 1.5*  HGB 11.4* 10.0* 10.2* 10.2*  HCT 36.3 32.4* 32.4* 32.5*  MCV 88.8 88.8 89.8 90.3  PLT 392 351 323 [id number]   Basic Metabolic Panel: Recent Labs  Lab 12/15/20 0500 12/16/20 0427 12/16/20 1247 12/17/20 0430 12/18/20 0459 12/19/20 0500  NA 136 132*  --  131* 133* 137  K 4.7 6.2* 3.9 5.2* 5.3* 4.0  CL 99 97*  --  97* 95* 101  CO2 31 30  --  29 29 31   GLUCOSE 84 285*  --  217* 256* 95  BUN 17 14  --  10 11 11   CREATININE 0.94 0.81  --  0.70 0.88 0.72  CALCIUM 8.8* 8.3*  --  8.4* 8.5* 8.8*  MG 2.0 1.9 2.0 1.9  --   --   PHOS 4.6 4.0  --  4.8*  --   --    GFR: Estimated Creatinine Clearance: 71.8 mL/min (by C-G formula based on SCr of 0.72 mg/dL). Liver Function Tests: Recent Labs  Lab 12/15/20 0500 12/16/20 0427 12/17/20 0430  AST 26 21 25  ALT 37 28 29  ALKPHOS 99 91 88  BILITOT 0.5 0.4 0.5  PROT 6.4* 5.5* 5.7*  ALBUMIN 2.4* 2.1* 2.2*   No results for input(s): LIPASE, AMYLASE in the last 168 hours. No results for input(s): AMMONIA in the last 168 hours. Coagulation Profile: No results for input(s): INR, PROTIME in the last 168 hours. Cardiac Enzymes: No results for input(s): CKTOTAL, CKMB, CKMBINDEX, TROPONINI in the last 168 hours. BNP (last 3 results) No results for input(s): PROBNP in the last 8760 hours. HbA1C: No results for input(s): HGBA1C in the last 72 hours. CBG: No results for input(s): GLUCAP in the last 168 hours. Lipid Profile: No results for input(s): CHOL, HDL, LDLCALC, TRIG, CHOLHDL, LDLDIRECT in the last 72 hours. Thyroid Function Tests: No results for input(s): TSH, T4TOTAL, FREET4, T3FREE, THYROIDAB in the last 72 hours. Anemia Panel: No results for input(s): VITAMINB12, FOLATE, FERRITIN, TIBC, IRON, RETICCTPCT in  the last 72 hours.    Radiology Studies: I have reviewed all of the imaging during this hospital visit personally     Scheduled Meds:  apixaban  5 mg Oral BID   Chlorhexidine Gluconate Cloth  6 each Topical Daily   COVID-19 mRNA bivalent vaccine (Pfizer)  0.3 mL Intramuscular Once   divalproex  250 mg Oral Q12H   feeding supplement  237 mL Oral BID BM   methadone  20 mg Oral Daily   multivitamin with minerals  1 tablet Oral Daily   pantoprazole  40 mg Oral Daily   polyethylene glycol  17 g Oral Daily   sodium chloride flush  10-40 mL Intracatheter Q12H   traZODone  75 mg Oral QHS   Continuous Infusions:  penicillin g continuous IV infusion 12 Million Units (12/21/20 0401)     LOS: 16 days        Vivian Okelley Gerome Apley, MD

## 2020-12-22 ENCOUNTER — Other Ambulatory Visit: Payer: Self-pay | Admitting: Pharmacist

## 2020-12-22 ENCOUNTER — Other Ambulatory Visit (HOSPITAL_COMMUNITY): Payer: Self-pay

## 2020-12-22 DIAGNOSIS — R7881 Bacteremia: Secondary | ICD-10-CM

## 2020-12-22 DIAGNOSIS — F1910 Other psychoactive substance abuse, uncomplicated: Secondary | ICD-10-CM

## 2020-12-22 DIAGNOSIS — I079 Rheumatic tricuspid valve disease, unspecified: Secondary | ICD-10-CM | POA: Diagnosis not present

## 2020-12-22 DIAGNOSIS — I809 Phlebitis and thrombophlebitis of unspecified site: Secondary | ICD-10-CM

## 2020-12-22 DIAGNOSIS — I82622 Acute embolism and thrombosis of deep veins of left upper extremity: Secondary | ICD-10-CM | POA: Diagnosis not present

## 2020-12-22 DIAGNOSIS — F1990 Other psychoactive substance use, unspecified, uncomplicated: Secondary | ICD-10-CM | POA: Diagnosis not present

## 2020-12-22 DIAGNOSIS — I059 Rheumatic mitral valve disease, unspecified: Secondary | ICD-10-CM | POA: Diagnosis not present

## 2020-12-22 DIAGNOSIS — B955 Unspecified streptococcus as the cause of diseases classified elsewhere: Secondary | ICD-10-CM

## 2020-12-22 DIAGNOSIS — Z79899 Other long term (current) drug therapy: Secondary | ICD-10-CM

## 2020-12-22 LAB — IRON AND TIBC
Iron: 93 ug/dL (ref 28–170)
Saturation Ratios: 22 % (ref 10.4–31.8)
TIBC: 417 ug/dL (ref 250–450)
UIBC: 324 ug/dL

## 2020-12-22 LAB — RETICULOCYTES
Immature Retic Fract: 15.2 % (ref 2.3–15.9)
RBC.: 3.68 MIL/uL — ABNORMAL LOW (ref 3.87–5.11)
Retic Count, Absolute: 96.4 10*3/uL (ref 19.0–186.0)
Retic Ct Pct: 2.6 % (ref 0.4–3.1)

## 2020-12-22 LAB — RESP PANEL BY RT-PCR (FLU A&B, COVID) ARPGX2
Influenza A by PCR: NEGATIVE
Influenza B by PCR: NEGATIVE
SARS Coronavirus 2 by RT PCR: NEGATIVE

## 2020-12-22 LAB — VITAMIN B12: Vitamin B-12: 288 pg/mL (ref 180–914)

## 2020-12-22 LAB — FERRITIN: Ferritin: 40 ng/mL (ref 11–307)

## 2020-12-22 LAB — GROUP A STREP BY PCR: Group A Strep by PCR: NOT DETECTED

## 2020-12-22 LAB — FOLATE: Folate: 13.6 ng/mL (ref >5.9)

## 2020-12-22 MED ORDER — APRETUDE 600 MG/3ML IM SUER
600.0000 mg | INTRAMUSCULAR | 1 refills | Status: DC
  Filled 2020-12-22 – 2021-01-07 (×2): qty 3, 30d supply, fill #0

## 2020-12-22 MED ORDER — PREDNISONE 20 MG PO TABS
60.0000 mg | ORAL_TABLET | Freq: Every day | ORAL | Status: DC
  Administered 2020-12-22: 60 mg via ORAL
  Filled 2020-12-22 (×2): qty 3

## 2020-12-22 MED ORDER — APRETUDE 600 MG/3ML IM SUER
600.0000 mg | INTRAMUSCULAR | 5 refills | Status: DC
  Filled 2020-12-22: qty 3, 60d supply, fill #0

## 2020-12-22 NOTE — TOC Progression Note (Addendum)
Transition of Care [facility]) - Progression Note    Patient Details  Name: Summer Cain MRN: [id number] Date of Birth: 24-Apr-1985  Transition of Care [facility]) CM/SW [place], RN Phone Number: 12/22/2020, 12:20 PM  Clinical Narrative:    Case management met with the patient in the hospital room regarding transitions of care.  The patient states that she does not plan to discharge home to her previous address.  She states that she's unsure who she will discharge home with but plans to stay likely with a friend / family after her IV antibiotics at the hospital through January 14, 2021.  The patient does not have a primary care physician and plans to follow up with Dr. Drucilla Schmidt and [place] until she knows where she will be living after discharge.  [facility] was called for follow up appointment after discharge from the hospital.  Appointment scheduled for patient on February 23, 2021 at 8:50 am.  CM and MSW with DTP Team will continue to follow the patient for discharge needs for home - pending discharge Decmber 28, 2022.   Expected Discharge Plan: Home/Self Care Barriers to Discharge: Continued Medical Work up (The patient is scheduled for IV antibiotics through January 14, 2021)  Expected Discharge Plan and Services Expected Discharge Plan: Home/Self Care   Discharge Planning Services: CM Consult   Living arrangements for the past 2 months: Single Family Home                                       Social Determinants of Health (SDOH) Interventions    Readmission Risk Interventions No flowsheet data found.

## 2020-12-22 NOTE — Progress Notes (Signed)
[facility] PROGRESS NOTE  Summer Cain XHB:[id number] DOB: February 24, 1985 DOA: 12/04/2020 PCP: Patient, No Pcp Per (Inactive)  Status: Remains inpatient appropriate because:  Unsafe discharge plan-known IVDA who requires PICC line for long-term antibiotics  Barriers to discharge: Social: Patient still unclear as to living circumstances/disposition after discharge  Clinical: Requires long-term IV antibiotics and given history of IVDA unable to discharge to receive outpatient antibiotics  Level of care:  Med-Surg   Code Status: Full Family Communication: Patient on DVT prophylaxis: Eliquis COVID vaccination status: Pfizer 09/20/2019   HPI: 35 yo female with the past medical history of IV drug abuse, hepatitis C who presented with left arm erythema, edema and tenderness. Reported using Iv fentanyl an smoking cocaine several days before developing her symptoms.  She also reported pleuritic chest pain, dyspnea and hemoptysis Chest radiograph no infiltrates. CT chest no pulm embolism.  Positive cavitary lesion in the basilar lingula. Left upper extremity CT with long segment venous thrombus extending into the left axillary vein at the junction of the subclavian vein consistent with the vein thrombosis. Associated inflammatory changes indicative of thrombophlebitis.   Broad-spectrum antibiotics were initiated and subsequent blood cultures were positive for Streptococcus pyogenes.  Echo revealed TV, MV and pulmonic valve endocarditis without evidence of aortic root abscess. Further work-up with MRI cervical spine and thoracic spine with no evidence of discitis, osteomyelitis or abscess.   ID following and recommendation is to complete at least 4 weeks of IV antibiotic therapy (December 15), ideally 6 weeks to complete on 01/14/21.  Because patient developed strange behaviors and impaired state after a visitor on the evening of 12/3 plan was to continue telemetry sitter     Subjective: Patient complaining of significant sore throat and odynophagia as well as abdominal cramping and nausea.  Complaining of insomnia and generalized malaise as well.  Objective: Vitals:   12/21/20 2333 12/22/20 0325  BP: (!) 98/59 107/68  Pulse: 91 88  Resp: 16 18  Temp: 98 F (36.7 C) 98 F (36.7 C)  SpO2:  98%    Intake/Output Summary (Last 24 hours) at 12/22/2020 0751 Last data filed at 12/22/2020 0400 Gross per 24 hour  Intake 1240.1 ml  Output 0 ml  Net 1240.1 ml   Filed Weights   12/19/20 0500 12/21/20 0100 12/22/20 0325  Weight: 53.4 kg 51 kg 51 kg    Exam:  Constitutional: NAD, calm, mildly uncomfortable secondary to sore throat with laryngitis and abdominal discomfort ENMT: Mucous membranes are moist. Posterior pharynx clear of any exudate or lesions was erythema.Normal dentition.  Neck: normal, supple, no masses, no thyromegaly-bilateral cervical lymph nodes are discrete and mobile on palpation and somewhat tender Respiratory: clear to auscultation bilaterally, no wheezing, no crackles although somewhat coarse.  Room air.  Normal respiratory effort. No accessory muscle use.  Cardiovascular: Regular rate and rhythm, no murmurs / rubs / gallops. No extremity edema. 2+ pedal pulses. No carotid bruits.  Abdomen: no tenderness, no masses palpated. No hepatosplenomegaly. Bowel sounds positive. LBM/04 Skin: no rashes, lesions, ulcers. No induration.  Left upper extremity mildly erythematous Neurologic: CN 2-12 grossly intact. Sensation intact, DTR normal. Strength 5/5 x all 4 extremities.  Psychiatric: Normal judgment and insight. Alert and oriented x 3. Normal mood.    Assessment/Plan: Acute problems: Tricuspid, mitral and pulmonary valve endocarditis, pulmonary septic embolic phenomena and sepsis (present on admission) 2/2 streptococcal A bacteremia Blood cultures from 11/20 with no growth. S/P TEE Evaluated by CVTS with recommendations for conservative  treatment  with antibiotics.  No indication for surgery given trace valvular dysfunction with small vegetations without aortic root abscess Antibiotic therapy with IV penicillin G  with a duration of 6 weeks (LD December 28) , if she leaves at 4 weeks (December 15), will need oral antibiotic therapy with amoxicillin to complete 6 weeks Stable on room air and no further apparent hemoptysis although patient reports some black expectorant    Left upper extremity cellulitis/ thrombophlebitis, Continue with methadone and oxycodone.  Decreased frequency of diphenhydramine to every 8 hrs as needed.  Anticoagulation with apixaban.   Sore throat/laryngitis Possibly secondary to excessive coughing in the context of pulmonary septic emboli Currently afebrile but patient also on antibiotics which could be suppressing fever response Give one-time dose of prednisone 60 mg for laryngitis since patient is otherwise being actively treated for bacteremia Check flu/COVID PCR and streptococcal A PCR-given she has strep a bacteremia this will likely be positive    Hyponatremia, hypocalcemia, and pseudo hyperkalemia   Resolved   Hepatitis C Previous hepatitis B vaccine reported Follow as outpatient    IV drug abuse/ insomnia Plan to continue with methadone and oxycodone. PRN ibuprofen.  Continue with diphenhydramine only every 8 hrs as needed  Decreased trazodone back to 50 mg at night.  Continue with depakote-valproic acid level pending for 12/6-we will also need to follow LFTs Needs close 1:1 observation when she gets visitors.   Protein calorie malnutrition Nutrition Problem: Moderate Malnutrition Etiology: social / environmental circumstances, acute illness (IVDU, endocarditis) Signs/Symptoms: mild muscle depletion, mild fat depletion Interventions: Ensure Enlive (each supplement provides 350kcal and 20 grams of protein), MVI Body mass index is 21.96 kg/m.    Pseudo hyperglycemia.    Resolved  Anemia of chronic disease/severe iron deficiency.  Hemoglobin 10.2 Last anemia panel checked March 2021 with an iron less than 5 with normal ferritin and folate Repeat anemia panel this admission   Data Reviewed: Basic Metabolic Panel: Recent Labs  Lab 12/16/20 0427 12/16/20 1247 12/17/20 0430 12/18/20 0459 12/19/20 0500  NA 132*  --  131* 133* 137  K 6.2* 3.9 5.2* 5.3* 4.0  CL 97*  --  97* 95* 101  CO2 30  --  29 29 31   GLUCOSE 285*  --  217* 256* 95  BUN 14  --  10 11 11   CREATININE 0.81  --  0.70 0.88 0.72  CALCIUM 8.3*  --  8.4* 8.5* 8.8*  MG 1.9 2.0 1.9  --   --   PHOS 4.0  --  4.8*  --   --    Liver Function Tests: Recent Labs  Lab 12/16/20 0427 12/17/20 0430  AST 21 25  ALT 28 29  ALKPHOS 91 88  BILITOT 0.4 0.5  PROT 5.5* 5.7*  ALBUMIN 2.1* 2.2*    CBC: Recent Labs  Lab 12/16/20 0427 12/17/20 0430 12/18/20 0459  WBC 4.9 4.7 4.2  NEUTROABS 2.0 1.7 1.5*  HGB 10.0* 10.2* 10.2*  HCT 32.4* 32.4* 32.5*  MCV 88.8 89.8 90.3  PLT 351 323 302     Scheduled Meds:  apixaban  5 mg Oral BID   Chlorhexidine Gluconate Cloth  6 each Topical Daily   COVID-19 mRNA bivalent vaccine (Pfizer)  0.3 mL Intramuscular Once   divalproex  250 mg Oral Q12H   feeding supplement  237 mL Oral BID BM   methadone  20 mg Oral Daily   multivitamin with minerals  1 tablet Oral Daily   pantoprazole  40 mg  Oral Daily   polyethylene glycol  17 g Oral Daily   sodium chloride flush  10-40 mL Intracatheter Q12H   traZODone  50 mg Oral QHS   Continuous Infusions:  penicillin g continuous IV infusion 12 Million Units (12/22/20 0520)    Principal Problem:   Acute deep vein thrombosis (DVT) of left upper extremity (HCC) Active Problems:   Moderate cocaine use disorder (HCC)   IVDU (intravenous drug user)   Hepatitis C   Sepsis ([place])   Opioid use disorder   Thrombophlebitis   Hyponatremia   Pneumonia   Deep vein thrombosis (DVT) of left upper extremity,  unspecified chronicity, unspecified vein (HCC)   Endocarditis of mitral valve   Endocarditis of tricuspid valve   Polysubstance abuse ([place])   Streptococcal bacteremia   Malnutrition of moderate degree   Consultants: Infectious disease Cardiothoracic surgery  Procedures: TEE  Antibiotics: Ceftriaxone x1 dose 11/17 IV vancomycin x1 dose on 11/17 Cefepime x1 dose on 11/18 Clindamycin 11/18 through 11/21 Penicillin G 11/18 >>   Time spent: 40 minutes    Erin Hearing ANP  [facility] 7 am - 330 pm/M-F for direct patient care and secure chat Please refer to Amion for contact info 17  days

## 2020-12-22 NOTE — Progress Notes (Signed)
Per nursing staff report patient had inappropriate behavior with visitors in her room.  There is documentation of altered mentation after visitors in the room.  Will restrict visitation at this point for patient safety.  Continue close tele-sitter monitoring.

## 2020-12-23 DIAGNOSIS — I079 Rheumatic tricuspid valve disease, unspecified: Secondary | ICD-10-CM | POA: Diagnosis not present

## 2020-12-23 DIAGNOSIS — I82622 Acute embolism and thrombosis of deep veins of left upper extremity: Secondary | ICD-10-CM | POA: Diagnosis not present

## 2020-12-23 DIAGNOSIS — E440 Moderate protein-calorie malnutrition: Secondary | ICD-10-CM | POA: Diagnosis not present

## 2020-12-23 DIAGNOSIS — I059 Rheumatic mitral valve disease, unspecified: Secondary | ICD-10-CM | POA: Diagnosis not present

## 2020-12-23 LAB — COMPREHENSIVE METABOLIC PANEL
ALT: 20 U/L (ref 0–44)
AST: 19 U/L (ref 15–41)
Albumin: 2.4 g/dL — ABNORMAL LOW (ref 3.5–5.0)
Alkaline Phosphatase: 67 U/L (ref 38–126)
Anion gap: 7 (ref 5–15)
BUN: 9 mg/dL (ref 6–20)
CO2: 28 mmol/L (ref 22–32)
Calcium: 8.5 mg/dL — ABNORMAL LOW (ref 8.9–10.3)
Chloride: 95 mmol/L — ABNORMAL LOW (ref 98–111)
Creatinine, Ser: 0.67 mg/dL (ref 0.44–1.00)
GFR, Estimated: >60 mL/min (ref >60)
Glucose, Bld: 273 mg/dL — ABNORMAL HIGH (ref 70–99)
Potassium: 5.5 mmol/L — ABNORMAL HIGH (ref 3.5–5.1)
Sodium: 130 mmol/L — ABNORMAL LOW (ref 135–145)
Total Bilirubin: 0.4 mg/dL (ref 0.3–1.2)
Total Protein: 5.9 g/dL — ABNORMAL LOW (ref 6.5–8.1)

## 2020-12-23 LAB — CBC
HCT: 31.1 % — ABNORMAL LOW (ref 36.0–46.0)
Hemoglobin: 9.9 g/dL — ABNORMAL LOW (ref 12.0–15.0)
MCH: 28.5 pg (ref 26.0–34.0)
MCHC: 31.8 g/dL (ref 30.0–36.0)
MCV: 89.6 fL (ref 80.0–100.0)
Platelets: 251 10*3/uL (ref 150–400)
RBC: 3.47 MIL/uL — ABNORMAL LOW (ref 3.87–5.11)
RDW: 15.7 % — ABNORMAL HIGH (ref 11.5–15.5)
WBC: 4.8 10*3/uL (ref 4.0–10.5)
nRBC: 0 % (ref 0.0–0.2)

## 2020-12-23 LAB — MAGNESIUM: Magnesium: 1.7 mg/dL (ref 1.7–2.4)

## 2020-12-23 LAB — VALPROIC ACID LEVEL: Valproic Acid Lvl: 26 ug/mL — ABNORMAL LOW (ref 50.0–100.0)

## 2020-12-23 MED ORDER — NAPROXEN 250 MG PO TABS
500.0000 mg | ORAL_TABLET | Freq: Two times a day (BID) | ORAL | Status: DC
  Administered 2020-12-23: 500 mg via ORAL
  Filled 2020-12-23 (×2): qty 2

## 2020-12-23 MED ORDER — NAPROXEN 250 MG PO TABS
500.0000 mg | ORAL_TABLET | Freq: Two times a day (BID) | ORAL | Status: DC | PRN
  Administered 2020-12-26: 500 mg via ORAL
  Filled 2020-12-23 (×2): qty 2

## 2020-12-23 MED ORDER — METAXALONE 800 MG PO TABS
400.0000 mg | ORAL_TABLET | Freq: Three times a day (TID) | ORAL | Status: DC
  Administered 2020-12-24 (×2): 400 mg via ORAL
  Filled 2020-12-23 (×8): qty 0.5
  Filled 2020-12-23 (×2): qty 1

## 2020-12-23 MED ORDER — ALPRAZOLAM 0.5 MG PO TABS
1.0000 mg | ORAL_TABLET | Freq: Three times a day (TID) | ORAL | Status: DC | PRN
  Administered 2020-12-23 – 2020-12-24 (×3): 1 mg via ORAL
  Filled 2020-12-23 (×3): qty 2

## 2020-12-23 NOTE — Progress Notes (Signed)
Pt appearing very lethargic this morning.  Upon bedside shift report she awoke with a start, asked for the trash can to spit.  States she fell asleep with food in her mouth.  She seems very lethargic, slurring words.  Vital signs stable.  MD paged and asked to come evaluate.

## 2020-12-23 NOTE — Plan of Care (Signed)
  Problem: Clinical Measurements: Goal: Will remain free from infection Outcome: Progressing   Problem: Health Behavior/Discharge Planning: Goal: Ability to manage health-related needs will improve Outcome: Not Progressing   

## 2020-12-23 NOTE — Progress Notes (Signed)
[facility] PROGRESS NOTE  Virga Haltiwanger HQP:[id number] DOB: 20-Apr-1985 DOA: 12/04/2020 PCP: Patient, No Pcp Per (Inactive)  Status: Remains inpatient appropriate because:  Unsafe discharge plan-known IVDA who requires PICC line for long-term antibiotics  Barriers to discharge: Social: Patient still unclear as to living circumstances/disposition after discharge  Clinical: Requires long-term IV antibiotics and given history of IVDA unable to discharge to receive outpatient antibiotics  Level of care:  Med-Surg   Code Status: Full Family Communication: Patient on DVT prophylaxis: Eliquis COVID vaccination status: Pfizer 09/20/2019   HPI: 35 yo female with the past medical history of IV drug abuse, hepatitis C who presented with left arm erythema, edema and tenderness. Reported using Iv fentanyl an smoking cocaine several days before developing her symptoms.  She also reported pleuritic chest pain, dyspnea and hemoptysis Chest radiograph no infiltrates. CT chest no pulm embolism.  Positive cavitary lesion in the basilar lingula. Left upper extremity CT with long segment venous thrombus extending into the left axillary vein at the junction of the subclavian vein consistent with the vein thrombosis. Associated inflammatory changes indicative of thrombophlebitis.   Broad-spectrum antibiotics were initiated and subsequent blood cultures were positive for Streptococcus pyogenes.  Echo revealed TV, MV and pulmonic valve endocarditis without evidence of aortic root abscess. Further work-up with MRI cervical spine and thoracic spine with no evidence of discitis, osteomyelitis or abscess.   ID following and recommendation is to complete at least 4 weeks of IV antibiotic therapy (December 15), ideally 6 weeks to complete on 01/14/21.  Because patient developed strange behaviors and impaired state after a visitor on the evening of 12/3 plan was to continue telemetry sitter     Subjective: Sleeping soundly and did not awaken during my physical examination.  Upon review of notes from yesterday evening patient had another episode of altered mentation/lethargy after visitor  Objective: Vitals:   12/23/20 0037 12/23/20 0651  BP: 121/62 109/63  Pulse: 60 61  Resp: 15 16  Temp: 98.3 F (36.8 C) 98.1 F (36.7 C)  SpO2: 97% 99%    Intake/Output Summary (Last 24 hours) at 12/23/2020 0804 Last data filed at 12/23/2020 0431 Gross per 24 hour  Intake 1502.35 ml  Output 0 ml  Net 1502.35 ml   Filed Weights   12/19/20 0500 12/21/20 0100 12/22/20 0325  Weight: 53.4 kg 51 kg 51 kg    Exam:  Constitutional: NAD, sleeping soundly Respiratory: clear to auscultation bilaterally, Room air.  Normal respiratory effort. No accessory muscle use.  Cardiovascular: Regular rate and rhythm, S1-S2.  No extremity edema.  Abdomen: no tenderness, Bowel sounds positive. LBM 12/04 Skin: no rashes, lesions, ulcers. No induration.  Left upper extremity mildly erythematous Neurologic: Sleeping but at baseline CN 2-12 grossly intact. Sensation intact, DTR normal. Strength 5/5 x all 4 extremities.  Psychiatric: Sleeping but at baseline normal judgment and insight. Alert and oriented x 3. Normal mood.    Assessment/Plan: Acute problems: Tricuspid, mitral and pulmonary valve endocarditis, pulmonary septic embolic phenomena and sepsis (present on admission) 2/2 streptococcal A bacteremia S/P TEE Evaluated by CVTS rec conservative treatment with antibiotics.  No indication for surgery  Continue IV penicillin G  with a duration of 6 weeks (LD December 28) , if she leaves at 4 weeks (December 15), will need oral antibiotic therapy with amoxicillin duration 6 weeks    Left upper extremity cellulitis/ thrombophlebitis, Continue with methadone and oxycodone.  Decreased frequency of diphenhydramine to every 8 hrs as needed.  Anticoagulation with  apixaban.  Continue as needed NSAIDs and  add low-dose scheduled Skelaxin  Sore throat/laryngitis Possibly secondary to excessive coughing in the context of pulmonary septic emboli Currently afebrile but patient also on antibiotics which could be suppressing fever response Give one-time dose of prednisone 60 mg for laryngitis since patient is otherwise being actively treated for bacteremia Check flu/COVID PCR and streptococcal A PCR-given she has strep a bacteremia this will likely be positive    Hyponatremia, hypocalcemia, and pseudo hyperkalemia   Resolved-due to collection of labs after infusion of PCN G   Hepatitis C Previous hepatitis B vaccine reported Follow as outpatient    IV drug abuse/ insomnia Plan to continue with methadone  Continue with diphenhydramine only every 8 hrs as needed  Decreased trazodone back to 50 mg at night.  Continue with depakote-valproic acid level 26 on 12/6 Needs close 1:1 observation when she gets visitors.  If has an additional episode of behavior or mentation changes after receiving visitors unfortunately visitors will need to be restricted  Protein calorie malnutrition Nutrition Problem: Moderate Malnutrition Etiology: social / environmental circumstances, acute illness (IVDU, endocarditis) Signs/Symptoms: mild muscle depletion, mild fat depletion Interventions: Ensure Enlive (each supplement provides 350kcal and 20 grams of protein), MVI Body mass index is 21.96 kg/m.    Pseudo hyperglycemia.   Resolved  Anemia of chronic disease/severe iron deficiency.  Hemoglobin 10.2 Last anemia panel checked March 2021 with an iron less than 5 with normal ferritin and folate Repeat anemia panel this admission   Data Reviewed: Basic Metabolic Panel: Recent Labs  Lab 12/16/20 1247 12/17/20 0430 12/18/20 0459 12/19/20 0500 12/23/20 0427  NA  --  131* 133* 137 130*  K 3.9 5.2* 5.3* 4.0 5.5*  CL  --  97* 95* 101 95*  CO2  --  29 29 31 28   GLUCOSE  --  217* 256* 95 273*  BUN  --  10  11 11 9   CREATININE  --  0.70 0.88 0.72 0.67  CALCIUM  --  8.4* 8.5* 8.8* 8.5*  MG 2.0 1.9  --   --  1.7  PHOS  --  4.8*  --   --   --    Liver Function Tests: Recent Labs  Lab 12/17/20 0430 12/23/20 0427  AST 25 19  ALT 29 20  ALKPHOS 88 67  BILITOT 0.5 0.4  PROT 5.7* 5.9*  ALBUMIN 2.2* 2.4*    CBC: Recent Labs  Lab 12/17/20 0430 12/18/20 0459 12/23/20 0427  WBC 4.7 4.2 4.8  NEUTROABS 1.7 1.5*  --   HGB 10.2* 10.2* 9.9*  HCT 32.4* 32.5* 31.1*  MCV 89.8 90.3 89.6  PLT 323 302 251     Scheduled Meds:  apixaban  5 mg Oral BID   Chlorhexidine Gluconate Cloth  6 each Topical Daily   COVID-19 mRNA bivalent vaccine (Pfizer)  0.3 mL Intramuscular Once   divalproex  250 mg Oral Q12H   feeding supplement  237 mL Oral BID BM   metaxalone  400 mg Oral TID AC & HS   methadone  20 mg Oral Daily   multivitamin with minerals  1 tablet Oral Daily   pantoprazole  40 mg Oral Daily   polyethylene glycol  17 g Oral Daily   sodium chloride flush  10-40 mL Intracatheter Q12H   traZODone  50 mg Oral QHS   Continuous Infusions:  penicillin g continuous IV infusion 12 Million Units (12/23/20 0414)    Principal Problem:   Acute  deep vein thrombosis (DVT) of left upper extremity (HCC) Active Problems:   Moderate cocaine use disorder (HCC)   IVDU (intravenous drug user)   Hepatitis C   Sepsis ([place])   Opioid use disorder   Thrombophlebitis   Hyponatremia   Pneumonia   Deep vein thrombosis (DVT) of left upper extremity, unspecified chronicity, unspecified vein (HCC)   Endocarditis of mitral valve   Endocarditis of tricuspid valve   Polysubstance abuse ([place])   Streptococcal bacteremia   Malnutrition of moderate degree   Consultants: Infectious disease Cardiothoracic surgery  Procedures: TEE  Antibiotics: Ceftriaxone x1 dose 11/17 IV vancomycin x1 dose on 11/17 Cefepime x1 dose on 11/18 Clindamycin 11/18 through 11/21 Penicillin G 11/18 >>   Time spent: 40  minutes    Erin Hearing ANP  [facility] 7 am - 330 pm/M-F for direct patient care and secure chat Please refer to Amion for contact info 18  days

## 2020-12-24 DIAGNOSIS — I079 Rheumatic tricuspid valve disease, unspecified: Secondary | ICD-10-CM | POA: Diagnosis not present

## 2020-12-24 DIAGNOSIS — F1990 Other psychoactive substance use, unspecified, uncomplicated: Secondary | ICD-10-CM | POA: Diagnosis not present

## 2020-12-24 DIAGNOSIS — I82A12 Acute embolism and thrombosis of left axillary vein: Secondary | ICD-10-CM | POA: Diagnosis not present

## 2020-12-24 DIAGNOSIS — I059 Rheumatic mitral valve disease, unspecified: Secondary | ICD-10-CM | POA: Diagnosis not present

## 2020-12-24 MED ORDER — ENSURE ENLIVE PO LIQD
237.0000 mL | Freq: Three times a day (TID) | ORAL | Status: DC
  Administered 2020-12-25 – 2020-12-27 (×3): 237 mL via ORAL

## 2020-12-24 NOTE — Progress Notes (Signed)
[facility] PROGRESS NOTE  Jhada Hilke [id number] DOB: 1985/02/20 DOA: 12/04/2020 PCP: Patient, No Pcp Per (Inactive)  Status: Remains inpatient appropriate because:  Unsafe discharge plan-known IVDA who requires PICC line for long-term antibiotics  Barriers to discharge: Social: Patient still unclear as to living circumstances/disposition after discharge  Clinical: Requires long-term IV antibiotics and given history of IVDA unable to discharge to receive outpatient antibiotics  Level of care:  Med-Surg   Code Status: Full Family Communication: Patient on DVT prophylaxis: Eliquis COVID vaccination status: Pfizer 09/20/2019   HPI: 35 yo female with the past medical history of IV drug abuse, hepatitis C who presented with left arm erythema, edema and tenderness. Reported using Iv fentanyl an smoking cocaine several days before developing her symptoms.  She also reported pleuritic chest pain, dyspnea and hemoptysis Chest radiograph no infiltrates. CT chest no pulm embolism.  Positive cavitary lesion in the basilar lingula. Left upper extremity CT with long segment venous thrombus extending into the left axillary vein at the junction of the subclavian vein consistent with the vein thrombosis. Associated inflammatory changes indicative of thrombophlebitis.   Broad-spectrum antibiotics were initiated and subsequent blood cultures were positive for Streptococcus pyogenes.  Echo revealed TV, MV and pulmonic valve endocarditis without evidence of aortic root abscess. Further work-up with MRI cervical spine and thoracic spine with no evidence of discitis, osteomyelitis or abscess.   ID following and recommendation is to complete at least 4 weeks of IV antibiotic therapy (December 15), ideally 6 weeks to complete on 01/14/21.  Because patient developed strange behaviors and impaired state after a visitor on the evening of 12/3 plan was to continue telemetry sitter     Subjective: Patient sleeping.  Briefly awakened.  Somewhat difficult to engage in conversation since she appears to be drifting back off to sleep.  After I completed my exam and explained current plan of care patient did state thank you.  Objective: Vitals:   12/23/20 2330 12/24/20 0433  BP: (!) 100/56 (!) 92/45  Pulse: 62 67  Resp: 19 19  Temp: 98.1 F (36.7 C) (!) 97.4 F (36.3 C)  SpO2: 96% 97%    Intake/Output Summary (Last 24 hours) at 12/24/2020 0714 Last data filed at 12/24/2020 0440 Gross per 24 hour  Intake 1094.39 ml  Output 1170 ml  Net -75.61 ml   Filed Weights   12/21/20 0100 12/22/20 0325 12/24/20 0433  Weight: 51 kg 51 kg 50 kg    Exam:  Constitutional: NAD, sleeping soundly did awaken for exam Respiratory: Stable on room air, anterior lung sounds clear bilaterally, normal respiratory effort. No accessory muscle use.  Cardiovascular: Regular rate and rhythm, S1-S2.  No extremity edema.  Abdomen: no tenderness, Bowel sounds positive. LBM 12/06 Skin: no rashes, lesions, ulcers. No induration.  Left upper extremity mildly pink in color with previous erythema changes resolving Neurologic: Sleeping but at baseline CN 2-12 grossly intact. Sensation intact, DTR normal. Strength 5/5 x all 4 extremities.  Psychiatric: normal judgment and insight.  Awakens and oriented x 3. Normal mood.    Assessment/Plan: Acute problems: Tricuspid, mitral and pulmonary valve endocarditis, pulmonary septic embolic phenomena and sepsis (present on admission) 2/2 streptococcal A bacteremia S/P TEE Evaluated by CVTS rec conservative treatment with antibiotics.   Continue IV penicillin G  with a duration of 6 weeks (LD December 28) , if she leaves at 4 weeks (December 15), will need oral antibiotic therapy with amoxicillin duration 6 weeks    Left upper extremity  cellulitis/ thrombophlebitis, Continue with methadone and oxycodone.  Decreased frequency of diphenhydramine to every 8 hrs  as needed.  Anticoagulation with apixaban.  Continue as needed NSAIDs and low-dose scheduled Skelaxin  Sore throat/laryngitis Possibly secondary to excessive coughing in the context of pulmonary septic emboli Given prednisone 60 mg p.o. x1 Streptococcal a, flu and COVID PCR are negative   Hyponatremia, hypocalcemia, and pseudo hyperkalemia   Resolved-due to collection of labs after infusion of PCN G   Hepatitis C Previous hepatitis B vaccine reported Follow as outpatient    IV drug abuse/ insomnia Plan to continue with methadone  Continue with diphenhydramine only every 8 hrs as needed  Decreased trazodone back to 50 mg at night.  Continue with depakote-valproic acid level 26 on 12/6 Needs close 1:1 observation when she gets visitors.  If has an additional episode of behavior or mentation changes after receiving visitors unfortunately visitors will need to be restricted  Protein calorie malnutrition Nutrition Problem: Moderate Malnutrition Etiology: social / environmental circumstances, acute illness (IVDU, endocarditis) Signs/Symptoms: mild muscle depletion, mild fat depletion Interventions: Ensure Enlive (each supplement provides 350kcal and 20 grams of protein), MVI Body mass index is 21.54 kg/m.    Pseudo hyperglycemia.   Resolved  Anemia of chronic disease/severe iron deficiency.  Hemoglobin 10.2 Last anemia panel checked March 2021 with an iron less than 5 with normal ferritin and folate-repeat panel this admission within normal limits    Data Reviewed: Basic Metabolic Panel: Recent Labs  Lab 12/18/20 0459 12/19/20 0500 12/23/20 0427  NA 133* 137 130*  K 5.3* 4.0 5.5*  CL 95* 101 95*  CO2 29 31 28   GLUCOSE 256* 95 273*  BUN 11 11 9   CREATININE 0.88 0.72 0.67  CALCIUM 8.5* 8.8* 8.5*  MG  --   --  1.7   Liver Function Tests: Recent Labs  Lab 12/23/20 0427  AST 19  ALT 20  ALKPHOS 67  BILITOT 0.4  PROT 5.9*  ALBUMIN 2.4*    CBC: Recent Labs   Lab 12/18/20 0459 12/23/20 0427  WBC 4.2 4.8  NEUTROABS 1.5*  --   HGB 10.2* 9.9*  HCT 32.5* 31.1*  MCV 90.3 89.6  PLT 302 251     Scheduled Meds:  apixaban  5 mg Oral BID   Chlorhexidine Gluconate Cloth  6 each Topical Daily   COVID-19 mRNA bivalent vaccine (Pfizer)  0.3 mL Intramuscular Once   divalproex  250 mg Oral Q12H   feeding supplement  237 mL Oral BID BM   metaxalone  400 mg Oral TID AC & HS   methadone  20 mg Oral Daily   multivitamin with minerals  1 tablet Oral Daily   pantoprazole  40 mg Oral Daily   polyethylene glycol  17 g Oral Daily   sodium chloride flush  10-40 mL Intracatheter Q12H   traZODone  50 mg Oral QHS   Continuous Infusions:  penicillin g continuous IV infusion 12 Million Units (12/24/20 0247)    Principal Problem:   Acute deep vein thrombosis (DVT) of left upper extremity (HCC) Active Problems:   Moderate cocaine use disorder (HCC)   IVDU (intravenous drug user)   Hepatitis C   Sepsis (HCC)   Opioid use disorder   Thrombophlebitis   Hyponatremia   Pneumonia   Deep vein thrombosis (DVT) of left upper extremity, unspecified chronicity, unspecified vein (HCC)   Endocarditis of mitral valve   Endocarditis of tricuspid valve   Polysubstance abuse (HCC)  Streptococcal bacteremia   Malnutrition of moderate degree   Consultants: Infectious disease Cardiothoracic surgery  Procedures: TEE  Antibiotics: Ceftriaxone x1 dose 11/17 IV vancomycin x1 dose on 11/17 Cefepime x1 dose on 11/18 Clindamycin 11/18 through 11/21 Penicillin G 11/18 >>   Time spent: 40 minutes    Erin Hearing ANP  [facility] 7 am - 330 pm/M-F for direct patient care and secure chat Please refer to Amion for contact info 19  days

## 2020-12-24 NOTE — Progress Notes (Signed)
Nutrition Follow-up  DOCUMENTATION CODES:  Non-severe (moderate) malnutrition in context of social or environmental circumstances  INTERVENTION:  Continue regular diet.  Increase Ensure from BID to TID.  Continue MVI with minerals daily.  Encourage PO and supplement intake.  NUTRITION DIAGNOSIS:  Moderate Malnutrition related to social / environmental circumstances, acute illness (IVDU, endocarditis) as evidenced by mild muscle depletion, mild fat depletion. - ongoing  GOAL:  Patient will meet greater than or equal to 90% of their needs. - progressing  MONITOR:  PO intake, Supplement acceptance  REASON FOR ASSESSMENT:  Consult Assessment of nutrition requirement/status  ASSESSMENT:  35 yo female admitted with LUE DVT, cellulitis/thrombophlebitis, strep pyogenes bacteremia, endocarditis, sepsis. PMH includes IVDU, hepatitis C.  Spoke briefly with pt at bedside. Pt reports that her appetite is much improved and taking Ensures daily.  However, per EMR, pt refused Ensure this morning and has not been eating well the past few days.  Pt also seemed a bit distracted while speaking with RD. Patient sitting up in bed in complete darkness.  Per Epic, pt has eaten an average 53% (0-100%) over the past 8 meals.  Admit wt: 45 kg Current wt: 50 kg  Given limited PO intake, RD to add extra Ensure daily. Continue MVI with minerals.  Supplement: Ensure BID  Medications: reviewed; Depakote sprinkle BID, Skelaxin QID, MVI with minerals, Protonix  Labs: reviewed; Na 130 (L), K 5.5 (H), Glucose 273 (H)  HbA1c: 5.2% (09/26/2015)  Diet Order:   Diet Order             Diet regular Room service appropriate? Yes; Fluid consistency: Thin  Diet effective now                  EDUCATION NEEDS:  Education needs have been addressed  Skin:  Skin Assessment: Reviewed RN Assessment  Last BM:  12/23/20  Height:  Ht Readings from Last 1 Encounters:  12/08/20 5' (1.524 m)   Weight:   Wt Readings from Last 1 Encounters:  12/24/20 50 kg   BMI:  Body mass index is 21.54 kg/m.  Estimated Nutritional Needs:  Kcal:  1900-2100 Protein:  85-100 gm Fluid:  >/= 1.95 L  Vertell Limber, RD, LDN (she/her/hers) Clinical Inpatient Dietitian RD Pager/After-Hours/Weekend Pager # in [place]

## 2020-12-25 DIAGNOSIS — I059 Rheumatic mitral valve disease, unspecified: Secondary | ICD-10-CM | POA: Diagnosis not present

## 2020-12-25 DIAGNOSIS — I079 Rheumatic tricuspid valve disease, unspecified: Secondary | ICD-10-CM | POA: Diagnosis not present

## 2020-12-25 DIAGNOSIS — I82A12 Acute embolism and thrombosis of left axillary vein: Secondary | ICD-10-CM | POA: Diagnosis not present

## 2020-12-25 DIAGNOSIS — G9341 Metabolic encephalopathy: Secondary | ICD-10-CM

## 2020-12-25 DIAGNOSIS — F1990 Other psychoactive substance use, unspecified, uncomplicated: Secondary | ICD-10-CM | POA: Diagnosis not present

## 2020-12-25 LAB — HCG, QUANTITATIVE, PREGNANCY: hCG, Beta Chain, Quant, S: <1 m[IU]/mL (ref <5)

## 2020-12-25 LAB — AMMONIA: Ammonia: 39 umol/L — ABNORMAL HIGH (ref 9–35)

## 2020-12-25 MED ORDER — POLYETHYLENE GLYCOL 3350 17 G PO PACK
17.0000 g | PACK | Freq: Two times a day (BID) | ORAL | Status: DC
  Filled 2020-12-25 (×2): qty 1

## 2020-12-25 MED ORDER — METAXALONE 800 MG PO TABS
400.0000 mg | ORAL_TABLET | Freq: Two times a day (BID) | ORAL | Status: DC
  Filled 2020-12-25: qty 1
  Filled 2020-12-25: qty 0.5

## 2020-12-25 MED ORDER — ALPRAZOLAM 0.25 MG PO TABS
0.2500 mg | ORAL_TABLET | Freq: Three times a day (TID) | ORAL | Status: DC | PRN
  Administered 2020-12-25: 0.25 mg via ORAL
  Filled 2020-12-25: qty 1

## 2020-12-25 NOTE — Progress Notes (Signed)
Patient extremely lethargic this AM, during bed side shift report patient opened  eyes but only for a brief moment. Upon Shift assesment patient is arousable but speech is slurry when asked for a response to questions. Vital signs were obtained this AM see flow sheet for charting. Call bell with in reach. Tracye Szuch, Randall An RN

## 2020-12-25 NOTE — Progress Notes (Addendum)
Patient refusing to go to CT at this time stating attending team aware. Pope Brunty, Randall An RN  [phone]- patient continues to decline CT at this time. Edilia Ghuman, Randall An RN

## 2020-12-25 NOTE — Progress Notes (Signed)
MD: pleas call patient's mother for update. Mother lives in [place] and would like to know the status.   Rush Barer  [phone]  Thank you, nursing

## 2020-12-25 NOTE — Plan of Care (Signed)
?  Problem: Clinical Measurements: ?Goal: Will remain free from infection ?Outcome: Progressing ?  ?

## 2020-12-25 NOTE — Progress Notes (Signed)
Secure chat sent to Dr Jerral Ralph, Junious Silk NP and Jeananne Rama RN re safety lock device is not available.

## 2020-12-25 NOTE — Progress Notes (Addendum)
Patient continues to be sleepy/ Lethargic, startles when awoken. Goes directly back to sleep after answering questions from nursing staff. Patient currently holding a half eaten banana in hand while sleeping. Does not appear to have food in mouth at this time. Call bell within reach. Aws Shere, Randall An RN

## 2020-12-25 NOTE — Progress Notes (Addendum)
[facility] PROGRESS NOTE  Summer Cain [id number] DOB: 1985-02-15 DOA: 12/04/2020 PCP: Patient, No Pcp Per (Inactive)  Status: Remains inpatient appropriate because:  Unsafe discharge plan-known IVDA who requires PICC line for long-term antibiotics  Barriers to discharge: Social: Patient still unclear as to living circumstances/disposition after discharge  Clinical: Requires long-term IV antibiotics and given history of IVDA unable to discharge to receive outpatient antibiotics  Level of care:  Med-Surg   Code Status: Full Family Communication: Patient; patient gave permission to update her mother.  No answer so voicemail update left following HIPAA protocol DVT prophylaxis: Eliquis COVID vaccination status: [place] 09/20/2019   HPI: 35 yo female with the past medical history of IV drug abuse, hepatitis C who presented with left arm erythema, edema and tenderness. Reported using Iv fentanyl an smoking cocaine several days before developing her symptoms.  She also reported pleuritic chest pain, dyspnea and hemoptysis Chest radiograph no infiltrates. CT chest no pulm embolism.  Positive cavitary lesion in the basilar lingula. Left upper extremity CT with long segment venous thrombus extending into the left axillary vein at the junction of the subclavian vein consistent with the vein thrombosis. Associated inflammatory changes indicative of thrombophlebitis.   Broad-spectrum antibiotics were initiated and subsequent blood cultures were positive for Streptococcus pyogenes.  Echo revealed TV, MV and pulmonic valve endocarditis without evidence of aortic root abscess. Further work-up with MRI cervical spine and thoracic spine with no evidence of discitis, osteomyelitis or abscess.   ID following and recommendation is to complete at least 4 weeks of IV antibiotic therapy (December 15), ideally 6 weeks to complete on 01/14/21.  Because patient developed strange behaviors and  impaired state after a visitor on the evening of 12/3 plan was to continue telemetry sitter    Subjective: Awake but very lethargic and having trouble interacting during exam.  Very slowly feeding self.  Objective: Vitals:   12/24/20 2322 12/25/20 0418  BP: (!) 106/57 110/60  Pulse: 76 81  Resp: 18 17  Temp: 98.2 F (36.8 C) 98 F (36.7 C)  SpO2: 97% 97%    Intake/Output Summary (Last 24 hours) at 12/25/2020 0744 Last data filed at 12/25/2020 0507 Gross per 24 hour  Intake 1592.24 ml  Output 1300 ml  Net 292.24 ml   Filed Weights   12/22/20 0325 12/24/20 0433 12/25/20 0418  Weight: 51 kg 50 kg 50.2 kg    Exam:  Constitutional: NAD, awake and appears comfortable Respiratory: RA, anterior lung sounds clear to auscultation, no increased work of breathing Cardiovascular: Regular rate and rhythm, S1-S2.  No extremity edema.  Abdomen: no tenderness, Bowel sounds positive. LBM 12/7 Skin: no rashes, lesions, ulcers. No induration.  Left upper extremity mildly pink in color with previous erythema changes resolving Neurologic: Sleeping but at baseline CN 2-12 grossly intact. Sensation intact, Strength 4+-5/5 x all 4 extremities.  Psychiatric: normal judgment and insight.  Awake and able to feed self but clearly more lethargic than during previous encounters earlier in the week  Assessment/Plan: Acute problems: Tricuspid, mitral and pulmonary valve endocarditis, pulmonary septic embolic phenomena and sepsis (present on admission) 2/2 streptococcal A bacteremia S/P TEE Evaluated by CVTS rec conservative treatment with antibiotics.   Continue IV penicillin G  with a duration of 6 weeks (LD December 28) , if she leaves at 4 weeks (December 15), will need oral antibiotic therapy with amoxicillin duration 6 weeks   Altered mental status Suspect primarily from oversedation Earlier in the week attending added Xanax  1 mg 3 times daily as needed for anxiety when patient was very upset and  crying.  Upon review of med rec patient has taken a total of 3 doses of this medication but she is also on Benadryl 50 mg every 8 hours as needed. DC Benadryl and decrease Xanax dose to 0.25 mg as needed Had been started on Skelaxin scheduled and this will be decreased to 400 mg twice daily With history of known septic emboli we will check CT of the head to ensure no acute neurological event  **Have just been notified by nursing staff/case manager regarding additional information as it relates to her altered mentation.  Apparently every single time that her PICC line is flushed patient develops acute altered mentation.  It is unclear if patient is obtaining a substance she has kidney and brought with her or if she is not swallowing her medications, crushing them and placing them in the line waiting for line to be flushed.  In this behavior the following plan will be initiated: 1) restrict visitors-currently no visitors will be allowed at this time 2) Place PICC lock to prevent tampering with line 3) Round somewhat distended abdomen and unclear if this is related to ascites, constipation or undetected pregnancy.  Records available in epic a pregnancy test does not appear to have been done therefore I have ordered both a urine and a serum pregnancy test. 4)  If pregnancy test is negative we will order a KUB to see if she is significantly constipated  Left upper extremity cellulitis/ thrombophlebitis, Continue with methadone and oxycodone.  Decreased frequency of diphenhydramine to every 8 hrs as needed.  Anticoagulation with apixaban.  Continue as needed NSAIDs and low-dose scheduled Skelaxin  Sore throat/laryngitis Possibly secondary to excessive coughing in the context of pulmonary septic emboli Given prednisone 60 mg p.o. x1 Streptococcal a, flu and COVID PCR are negative   Hyponatremia, hypocalcemia, and pseudo hyperkalemia   Resolved-due to collection of labs after infusion of PCN G    Hepatitis C Previous hepatitis B vaccine reported Follow as outpatient    IV drug abuse/ insomnia Plan to continue with methadone  Continue with diphenhydramine only every 8 hrs as needed  Decreased trazodone back to 50 mg at night.  Continue with depakote-valproic acid level 26 on 12/6 Needs close 1:1 observation when she gets visitors.  If has an additional episode of behavior or mentation changes after receiving visitors unfortunately visitors will need to be restricted  Protein calorie malnutrition Nutrition Problem: Moderate Malnutrition Etiology: social / environmental circumstances, acute illness (IVDU, endocarditis) Signs/Symptoms: mild muscle depletion, mild fat depletion Interventions: Ensure Enlive (each supplement provides 350kcal and 20 grams of protein), MVI Body mass index is 21.61 kg/m.    Pseudo hyperglycemia.   Resolved  Anemia of chronic disease/severe iron deficiency.  Hemoglobin 10.2 Last anemia panel checked March 2021 with an iron less than 5 with normal ferritin and folate-repeat panel this admission within normal limits    Data Reviewed: Basic Metabolic Panel: Recent Labs  Lab 12/19/20 0500 12/23/20 0427  NA 137 130*  K 4.0 5.5*  CL 101 95*  CO2 31 28  GLUCOSE 95 273*  BUN 11 9  CREATININE 0.72 0.67  CALCIUM 8.8* 8.5*  MG  --  1.7   Liver Function Tests: Recent Labs  Lab 12/23/20 0427  AST 19  ALT 20  ALKPHOS 67  BILITOT 0.4  PROT 5.9*  ALBUMIN 2.4*    CBC: Recent Labs  Lab  12/23/20 0427  WBC 4.8  HGB 9.9*  HCT 31.1*  MCV 89.6  PLT 251     Scheduled Meds:  apixaban  5 mg Oral BID   Chlorhexidine Gluconate Cloth  6 each Topical Daily   COVID-19 mRNA bivalent vaccine (Pfizer)  0.3 mL Intramuscular Once   divalproex  250 mg Oral Q12H   feeding supplement  237 mL Oral TID BM   metaxalone  400 mg Oral TID AC & HS   methadone  20 mg Oral Daily   multivitamin with minerals  1 tablet Oral Daily   pantoprazole  40 mg Oral  Daily   polyethylene glycol  17 g Oral Daily   sodium chloride flush  10-40 mL Intracatheter Q12H   traZODone  50 mg Oral QHS   Continuous Infusions:  penicillin g continuous IV infusion 12 Million Units (12/25/20 0248)    Principal Problem:   Acute deep vein thrombosis (DVT) of left upper extremity (HCC) Active Problems:   Moderate cocaine use disorder (HCC)   IVDU (intravenous drug user)   Hepatitis C   Sepsis (HCC)   Opioid use disorder   Thrombophlebitis   Hyponatremia   Pneumonia   Deep vein thrombosis (DVT) of left upper extremity, unspecified chronicity, unspecified vein (HCC)   Endocarditis of mitral valve   Endocarditis of tricuspid valve   Polysubstance abuse (HCC)   Streptococcal bacteremia   Malnutrition of moderate degree   Consultants: Infectious disease Cardiothoracic surgery  Procedures: TEE  Antibiotics: Ceftriaxone x1 dose 11/17 IV vancomycin x1 dose on 11/17 Cefepime x1 dose on 11/18 Clindamycin 11/18 through 11/21 Penicillin G 11/18 >>   Time spent: 40 minutes    Junious Silk ANP  [facility] 7 am - 330 pm/M-F for direct patient care and secure chat Please refer to Amion for contact info 20  days

## 2020-12-25 NOTE — TOC Progression Note (Signed)
Transition of Care [facility]) - Progression Note    Patient Details  Name: Summer Cain MRN: [id number] Date of Birth: Jul 31, 1985  [facility]) CM/SW Contact  Janae Bridgeman, RN Phone Number: 12/25/2020, 3:11 PM  Clinical Narrative:    Case management spoke with Wilkie Aye, RN IV on the unit and the nursing staff on the floor has been concerned that the patient is lethargic at times and the staff is concerned that the patient may be misusing medications.  I informed Junious Silk, NP that the staff is concerned that the patient may be misusing medications and Rennis Harding, NP spoke with Wilkie Aye, RN IV on the phone regarding the concerns.  Rennis Harding, NP placed orders for a non-contrast CT, urine pregnancy test and a safety sitter in the room.  Visitors will not be allowed to visit the patient since there is concern that a visitor may have brought medications or other items from home that the patient may be misusing without the staff being aware. I called and spoke with Macario Golds, MSW supervisor and made her aware and asked that Wilkie Aye, RN IV call [facility] security about the staff's concerns with suspected misuse of medications or PICC line.  Rennis Harding, NP is aware and will follow up with needed orders or safety measures regarding the patient.   Expected Discharge Plan: Home/Self Care Barriers to Discharge: Continued Medical Work up (The patient is scheduled for IV antibiotics through January 14, 2021)  Expected Discharge Plan and Services Expected Discharge Plan: Home/Self Care   Discharge Planning Services: CM Consult   Living arrangements for the past 2 months: Single Family Home                                       Social Determinants of Health (SDOH) Interventions    Readmission Risk Interventions No flowsheet data found.

## 2020-12-26 ENCOUNTER — Inpatient Hospital Stay (HOSPITAL_COMMUNITY): Payer: Medicaid Other

## 2020-12-26 DIAGNOSIS — I079 Rheumatic tricuspid valve disease, unspecified: Secondary | ICD-10-CM | POA: Diagnosis not present

## 2020-12-26 DIAGNOSIS — K5903 Drug induced constipation: Secondary | ICD-10-CM | POA: Diagnosis not present

## 2020-12-26 DIAGNOSIS — I059 Rheumatic mitral valve disease, unspecified: Secondary | ICD-10-CM | POA: Diagnosis not present

## 2020-12-26 DIAGNOSIS — K5900 Constipation, unspecified: Secondary | ICD-10-CM

## 2020-12-26 DIAGNOSIS — I82A12 Acute embolism and thrombosis of left axillary vein: Secondary | ICD-10-CM | POA: Diagnosis not present

## 2020-12-26 MED ORDER — HYDROXYZINE HCL 10 MG PO TABS
10.0000 mg | ORAL_TABLET | Freq: Three times a day (TID) | ORAL | Status: DC | PRN
  Filled 2020-12-26: qty 1

## 2020-12-26 MED ORDER — TRAZODONE HCL 150 MG PO TABS
75.0000 mg | ORAL_TABLET | Freq: Every day | ORAL | Status: DC
  Administered 2020-12-26: 75 mg via ORAL
  Filled 2020-12-26: qty 1

## 2020-12-26 MED ORDER — HYDROXYZINE HCL 10 MG PO TABS
10.0000 mg | ORAL_TABLET | Freq: Three times a day (TID) | ORAL | Status: DC | PRN
  Administered 2020-12-26 – 2020-12-27 (×2): 10 mg via ORAL
  Filled 2020-12-26 (×3): qty 1

## 2020-12-26 NOTE — Progress Notes (Addendum)
[facility] PROGRESS NOTE  Summer Cain [id number] DOB: 1985-02-24 DOA: 12/04/2020 PCP: Patient, No Pcp Per (Inactive)  Status: Remains inpatient appropriate because:  Unsafe discharge plan-known IVDA who requires PICC line for long-term antibiotics  Barriers to discharge: Social: Patient still unclear as to living circumstances/disposition after discharge  Clinical: Requires long-term IV antibiotics and given history of IVDA unable to discharge to receive outpatient antibiotics  Level of care:  Med-Surg   Code Status: Full Family Communication: Patient; patient gave permission to update her mother.  No answer so voicemail update left following HIPAA protocol DVT prophylaxis: Eliquis COVID vaccination status: [place] 09/20/2019   HPI: 35 yo female with the past medical history of IV drug abuse, hepatitis C who presented with left arm erythema, edema and tenderness. Reported using Iv fentanyl an smoking cocaine several days before developing her symptoms.  She also reported pleuritic chest pain, dyspnea and hemoptysis Chest radiograph no infiltrates. CT chest no pulm embolism.  Positive cavitary lesion in the basilar lingula. Left upper extremity CT with long segment venous thrombus extending into the left axillary vein at the junction of the subclavian vein consistent with the vein thrombosis. Associated inflammatory changes indicative of thrombophlebitis.   Broad-spectrum antibiotics were initiated and subsequent blood cultures were positive for Streptococcus pyogenes.  Echo revealed TV, MV and pulmonic valve endocarditis without evidence of aortic root abscess. Further work-up with MRI cervical spine and thoracic spine with no evidence of discitis, osteomyelitis or abscess.   ID following and recommendation is to complete at least 4 weeks of IV antibiotic therapy (December 15), ideally 6 weeks to complete on 01/14/21.  Because patient developed strange behaviors and  impaired state after a visitor on the evening of 12/3 plan was to continue telemetry sitter    Subjective: Much more awake and alert today.  Discussed methods to help decrease boredom while she is stuck in the hospital to which patient promptly replied "I am not just bored I hate it here".  Patient has also been having issues with incomplete emptying of the bowel leading to transient constipation.  This morning she refused her laxatives telling the nurse that she just had a large bowel movement.  X-ray done today noted large volume of retained stool in the entire colon  Objective: Vitals:   12/25/20 2354 12/26/20 0300  BP: (!) 96/59 (!) 99/53  Pulse: 73 72  Resp:  15  Temp: 98 F (36.7 C) 98.2 F (36.8 C)  SpO2: 96% 97%    Intake/Output Summary (Last 24 hours) at 12/26/2020 0739 Last data filed at 12/26/2020 0444 Gross per 24 hour  Intake 2535.55 ml  Output 3150 ml  Net -614.45 ml   Filed Weights   12/24/20 0433 12/25/20 0418 12/26/20 0300  Weight: 50 kg 50.2 kg 50.2 kg    Exam:  Constitutional: NAD, awake and appears comfortable Respiratory: RA, anterior lung sounds clear to auscultation, no increased work of breathing Cardiovascular: Regular rate and rhythm, S1-S2.  No extremity edema.  Abdomen: no tenderness, Bowel sounds positive. LBM 12/8 Skin: no rashes, lesions, ulcers. No induration.  Left upper extremity mildly pink in color with previous erythema changes resolving Neurologic: Sleeping but at baseline CN 2-12 grossly intact. Sensation intact, Strength 4+-5/5 x all 4 extremities.  Psychiatric: Awake and alert.  Oriented x3.  Verbalizing displeasure over need to stay in the hospital.  Assessment/Plan: Acute problems: Tricuspid, mitral and pulmonary valve endocarditis, pulmonary septic embolic phenomena and sepsis (present on admission) 2/2 streptococcal A  bacteremia S/P TEE Evaluated by CVTS rec conservative treatment with antibiotics.   Continue IV penicillin G  with  a duration of 6 weeks (LD December 28) , if she leaves at 4 weeks (December 15), will need oral antibiotic therapy with amoxicillin duration 6 weeks   Altered mental status Secondary to oversedation Earlier in the week attending added Xanax 1 mg TID prn. Subsequently Xanax, Skelaxin and Benadryl dc'd with improvement in mentation  Low-dose Vistaril prn added for itching and anxiety with no plans to increase dose in the future Trazodone dose increased to 75 mg on 12/9  Constipation Patient has rounded abdomen and infrequent bowel movements XR of abdomen demonstrates significant colonic retention of stool Patient refusing ordered laxative stool softener  Left upper extremity cellulitis/ thrombophlebitis, Continue with methadone -supplemental oxycodone discontinued secondary to oversedation Anticoagulation with apixaban.  Continue as needed NSAIDs   Sore throat/laryngitis Possibly secondary to excessive coughing in the context of pulmonary septic emboli Given prednisone 60 mg p.o. x1 Streptococcal a, flu and COVID PCR are negative   Hyponatremia, hypocalcemia, and pseudo hyperkalemia   Resolved-due to collection of labs after infusion of PCN G   Hepatitis C Previous hepatitis B vaccine reported Follow as outpatient    IV drug abuse/ insomnia/chronic anxiety Continue methadone  Continue trazadone and depakote; 12/9 add LOW DOSE vistaril q 8 hrs prn-DO NOT INCREASE THIS DOSE!!!! Continue to restrict visitors given repeated AMS episodes after visits; she also continues to demonstrate recurrent drug seeking behaviors  Protein calorie malnutrition Nutrition Problem: Moderate Malnutrition Etiology: social / environmental circumstances, acute illness (IVDU, endocarditis) Signs/Symptoms: mild muscle depletion, mild fat depletion Interventions: Ensure Enlive (each supplement provides 350kcal and 20 grams of protein), MVI Body mass index is 21.61 kg/m.    Pseudo hyperglycemia.    Resolved  Anemia of chronic disease/severe iron deficiency.  Hemoglobin 10.2 Last anemia panel checked March 2021 with an iron less than 5 with normal ferritin and folate-repeat panel this admission within normal limits    Data Reviewed: Basic Metabolic Panel: Recent Labs  Lab 12/23/20 0427  NA 130*  K 5.5*  CL 95*  CO2 28  GLUCOSE 273*  BUN 9  CREATININE 0.67  CALCIUM 8.5*  MG 1.7   Liver Function Tests: Recent Labs  Lab 12/23/20 0427  AST 19  ALT 20  ALKPHOS 67  BILITOT 0.4  PROT 5.9*  ALBUMIN 2.4*    CBC: Recent Labs  Lab 12/23/20 0427  WBC 4.8  HGB 9.9*  HCT 31.1*  MCV 89.6  PLT 251     Scheduled Meds:  apixaban  5 mg Oral BID   Chlorhexidine Gluconate Cloth  6 each Topical Daily   COVID-19 mRNA bivalent vaccine ([employer])  0.3 mL Intramuscular Once   divalproex  250 mg Oral Q12H   feeding supplement  237 mL Oral TID BM   methadone  20 mg Oral Daily   multivitamin with minerals  1 tablet Oral Daily   pantoprazole  40 mg Oral Daily   polyethylene glycol  17 g Oral BID   sodium chloride flush  10-40 mL Intracatheter Q12H   traZODone  50 mg Oral QHS   Continuous Infusions:  penicillin g continuous IV infusion 12 Million Units (12/26/20 0314)    Principal Problem:   Acute deep vein thrombosis (DVT) of left upper extremity (HCC) Active Problems:   Moderate cocaine use disorder (HCC)   IVDU (intravenous drug user)   Hepatitis C   Sepsis ([place])  Opioid use disorder   Thrombophlebitis   Hyponatremia   Pneumonia   Deep vein thrombosis (DVT) of left upper extremity, unspecified chronicity, unspecified vein (HCC)   Endocarditis of mitral valve   Endocarditis of tricuspid valve   Polysubstance abuse (HCC)   Streptococcal bacteremia   Malnutrition of moderate degree   Metabolic encephalopathy   Consultants: Infectious disease Cardiothoracic surgery  Procedures: TEE  Antibiotics: Ceftriaxone x1 dose 11/17 IV vancomycin x1 dose on  11/17 Cefepime x1 dose on 11/18 Clindamycin 11/18 through 11/21 Penicillin G 11/18 >>   Time spent: 40 minutes    Junious Silk ANP  [facility] 7 am - 330 pm/M-F for direct patient care and secure chat Please refer to Amion for contact info 21  days

## 2020-12-26 NOTE — Progress Notes (Signed)
Patient has been more alert tonight and easily arousable. She asked for anti-anxiety medication. RN informed her it was discontinued due to lethargy. Patient responded "of course Im sleepy, I hate this place." RN gave emotional support. Patient was easy to redirect and went back to sleep. Call bell in reach. Brynda Rim, RN

## 2020-12-27 DIAGNOSIS — I059 Rheumatic mitral valve disease, unspecified: Secondary | ICD-10-CM | POA: Diagnosis not present

## 2020-12-27 DIAGNOSIS — I82622 Acute embolism and thrombosis of deep veins of left upper extremity: Secondary | ICD-10-CM | POA: Diagnosis not present

## 2020-12-27 DIAGNOSIS — I079 Rheumatic tricuspid valve disease, unspecified: Secondary | ICD-10-CM | POA: Diagnosis not present

## 2020-12-27 MED ORDER — TRAZODONE HCL 100 MG PO TABS: 100.0000 mg | ORAL_TABLET | Freq: Every day | ORAL | Status: DC

## 2020-12-27 NOTE — Progress Notes (Signed)
Patient seen and examined.  No overnight events.  Denies any pain or difficulties.  Mood is stable.  She could not sleep all night and wants to see if she can use more trazodone.  Plan: Continue IV antibiotics through the PICC line, patient is tolerating well without complications. Mobilizing the hallway with the assist. Increase trazodone 100 mg at night to help with insomnia.  Already on methadone.  No additional opiates or benzodiazepines.  Total time spent: 20 minutes.
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
## 2020-12-27 NOTE — Progress Notes (Signed)
Pt's PICC line was removed by IV team.  Pt and her boyfriend were escorted out of the building by Security.

## 2020-12-27 NOTE — Progress Notes (Addendum)
Went to check on patient after seeing boyfriend leave.  Pt is packing her things and disconnected her IV.  Pt states she is leaving and wants her PICC line removed.  Pt signed AMA papers. Security is outside pt's room. Orders placed for IV team to remove PICC.  MD made aware of the situation.

## 2020-12-29 ENCOUNTER — Other Ambulatory Visit: Payer: Self-pay | Admitting: Internal Medicine

## 2020-12-29 ENCOUNTER — Other Ambulatory Visit (HOSPITAL_COMMUNITY): Payer: Self-pay

## 2020-12-29 MED ORDER — AMOXICILLIN 500 MG PO CAPS
500.0000 mg | ORAL_CAPSULE | Freq: Three times a day (TID) | ORAL | 0 refills | Status: AC
  Filled 2020-12-29: qty 90, 30d supply, fill #0

## 2020-12-29 NOTE — Discharge Summary (Signed)
Physician Discharge Summary  Summer Cain [id number] DOB: July 09, 1985 DOA: 12/04/2020  PCP: Patient, No Pcp Per (Inactive)  Admit date: 12/04/2020 Discharge date: 12/29/2020  Admitted From: Home Disposition: Home  Recommendations for Outpatient Follow-up:  Follow-up with infectious disease as scheduled.  Home Health: N/A Equipment/Devices: N/A  Discharge Condition: Fair CODE STATUS: Full code Diet recommendation: Regular diet.  Nutritional supplements.  Discharge summary:  35 year old female with history of IV drug abuse, epilepsy presented to the hospital with left arm erythema edema and tenderness, pleuritic chest pain and hemoptysis.  She was found to have cavitary lesion basilar lingula.  Also found to have long segment venous thrombosis on the left axillary vein.  Blood cultures positive for Streptococcus pyogenes.  Echocardiogram revealed tricuspid valve, mitral valve and pulmonic valve endocarditis without evidence of aortic root abscess.  MRI of the cervical spine and thoracic spine with no evidence of discitis or osteomyelitis.  Repeat cultures cleared.  Seen by infectious disease.  Seen by CT CS and recommended against valve surgery.  Patient had received PICC line and IV antibiotics in the hospital.  With the use of injectable drugs, she was not able to go home with indwelling IV line.  Patient was recovering in the hospital.  She was also started on methadone and other symptomatic treatment and was appropriately improving.   There was suspicion that her boyfriend was bringing drugs to the hospital room and he was prohibited from coming to the hospital room.  Patient had dissatisfaction with these arrangements though she stayed in the hospital and completed IV antibiotics from 11/17-12/10.  Recommendations were 6 weeks of IV antibiotics, she only completed 3 weeks and left AGAINST MEDICAL ADVICE.  Patient was alert oriented, she knew her actions and consequences and was  counseled extensively to stay in complete antibiotics.  However, she decided to just leave.  Incomplete treatment could cause recurrent infection and that could be fatal in the future and patient was aware about this.  Even though she left against our advice, prescribed her amoxicillin 500 mg 3 times a day for 6 weeks as suppressive therapy for a Streptococcus infection though this is suboptimal treatment for her bacterial endocarditis.    Discharge Diagnoses:  Principal Problem:   Acute deep vein thrombosis (DVT) of left upper extremity (HCC) Active Problems:   Moderate cocaine use disorder (HCC)   IVDU (intravenous drug user)   Hepatitis C   Sepsis ([place])   Opioid use disorder   Thrombophlebitis   Hyponatremia   Pneumonia   Deep vein thrombosis (DVT) of left upper extremity, unspecified chronicity, unspecified vein (HCC)   Endocarditis of mitral valve   Endocarditis of tricuspid valve   Polysubstance abuse ([place])   Streptococcal bacteremia   Malnutrition of moderate degree   Metabolic encephalopathy   Constipation    Discharge Instructions   Allergies as of 12/27/2020       Reactions   Latex Swelling        Medication List     ASK your doctor about these medications    Apretude 600 MG/3ML injection Generic drug: cabotegravir ER Inject 3 mLs (600 mg total) into the muscle every 30 (thirty) days.   Apretude 600 MG/3ML injection Generic drug: cabotegravir ER Inject 3 mLs (600 mg total) into the muscle every 2 (two) months.   QUEtiapine 200 MG tablet Commonly known as: SEROQUEL Take 200 mg by mouth at bedtime. Ask about: Which instructions should I use?   traZODone 100 MG tablet Commonly  known as: DESYREL Take 50-100 mg by mouth See admin instructions. 100mg  at bedtime, may take an additional 50mg  as needed for sleep Ask about: Which instructions should I use?        Follow-up Information     [facility]. Go on 01/23/2021.    Why: You are scheduled for a primary care follow up on February 23, 2021 at 8:50 am. Contact information: [address] [place] February 25, 2021 [place] [phone]               Allergies  Allergen Reactions   Latex Swelling    Consultations: Infectious disease CT CS   Procedures/Studies: DG Chest 1 View  Result Date: 12/05/2020 CLINICAL DATA:  Chest pain. EXAM: CHEST  1 VIEW COMPARISON:  Chest radiograph dated 09/19/2019. FINDINGS: Diffuse interstitial nodularity may represent mild edema. Atypical infection is not excluded clinical correlation is recommended no focal consolidation, pleural effusion or pneumothorax. The cardiac silhouette is within limits. Scoliosis. No acute osseous pathology. IMPRESSION: Diffuse interstitial nodularity may represent mild edema. Atypical infection is not excluded. No focal consolidation. Electronically Signed   By: 12/07/2020 M.D.   On: 12/05/2020 00:19   CT Angio Chest PE W and/or Wo Contrast  Result Date: 12/05/2020 CLINICAL DATA:  Left chest pain, dyspnea.  PE suspected, high prob. EXAM: CT ANGIOGRAPHY CHEST WITH CONTRAST TECHNIQUE: Multidetector CT imaging of the chest was performed using the standard protocol during bolus administration of intravenous contrast. Multiplanar CT image reconstructions and MIPs were obtained to evaluate the vascular anatomy. CONTRAST:  11mL OMNIPAQUE IOHEXOL 350 MG/ML SOLN COMPARISON:  None. FINDINGS: Cardiovascular: Adequate opacification of the pulmonary arterial tree. No intraluminal filling defect identified to suggest acute pulmonary embolism. Central pulmonary arteries are of normal caliber. No significant coronary artery calcification. Cardiac size within normal limits. No pericardial effusion. No significant atherosclerotic calcification within the thoracic aorta. No aortic aneurysm. Mediastinum/Nodes: There is left axillary and subpectoral adenopathy. No mediastinal or hilar adenopathy  identified. Thyroid unremarkable. Esophagus unremarkable. Lungs/Pleura: There is a cavitary, inflammatory appearing lesion within the inferior lingula which may represent the sequela of septic embolization or necrotizing pneumonia. An impacted airway is seen communicating with this region. A necrotic mass is considered less likely. The lungs are otherwise clear. No pneumothorax or pleural effusion. Upper Abdomen: No acute abnormality. Musculoskeletal: No chest wall abnormality. No acute or significant osseous findings. Subcutaneous edema is noted within the visualized left upper extremity with inflammatory stranding noted within the axilla. Review of the MIP images confirms the above findings. IMPRESSION: Shotty left axillary and subpectoral adenopathy, possibly reactive in nature and related to an inflammatory process within the left upper extremity and axilla. No pulmonary embolism. Cavitary lesion within the basilar lingula in keeping with the sequela of septic embolization or necrotizing pneumonia. Follow-up evaluation to document resolution is recommended following conservative therapy. Electronically Signed   By: 12/07/2020 M.D.   On: 12/05/2020 03:27   MR CERVICAL SPINE W WO CONTRAST  Result Date: 12/10/2020 CLINICAL DATA:  Back pain. 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 EXAM: MRI CERVICAL AND THORACIC SPINE WITHOUT AND WITH CONTRAST TECHNIQUE: Multiplanar and multiecho pulse sequences of the cervical spine, to include the craniocervical junction and cervicothoracic junction, and the thoracic spine, were obtained without and with intravenous contrast. CONTRAST:  37mL GADAVIST GADOBUTROL 1 MMOL/ML IV SOLN COMPARISON:  CT angiogram chest 12/05/2020. FINDINGS: MRI CERVICAL SPINE FINDINGS Alignment: Straightening of the expected cervical lordosis. Mild  cervical dextrocurvature, possibly positional. No significant spondylolisthesis. Vertebrae: Vertebral body height is maintained. No significant marrow edema or focal suspicious osseous lesion. Cord: No signal abnormality identified within the cervical spinal cord. No abnormal enhancement within the spinal canal. Posterior Fossa, vertebral arteries, paraspinal tissues: No abnormality identified within included portions of the posterior fossa. Flow voids preserved within the imaged cervical vertebral arteries. Paraspinal soft tissues unremarkable. Disc levels: No significant disc herniation, spinal canal stenosis or neural foraminal narrowing. MRI THORACIC SPINE FINDINGS Alignment:  Mild thoracic dextrocurvature.  No spondylolisthesis. Vertebrae: Vertebral body height is maintained. Nonspecific 5 mm round T2 STIR hyperintense and T1 hypointense lesion within the left aspect of the T6 vertebral body with possible subtle peripheral enhancement (for instance as seen on series 12, image 9) (series 15, image 19) (series 19, image 9). There are no findings to suggest discitis/osteomyelitis. Cord: No signal abnormality identified within the thoracic spinal cord. No abnormal enhancement within the thoracic spinal canal. Paraspinal and other soft tissues: Bilateral pleural effusions (small to moderate right, small left). No abnormality identified within included portions of the upper abdomen/retroperitoneum. Paraspinal soft tissues unremarkable. Disc levels: Intervertebral disc height and hydration are maintained throughout the thoracic spine. No significant disc herniation, spinal canal stenosis or neural foraminal narrowing. IMPRESSION: MRI cervical spine: 1. No evidence of discitis/osteomyelitis or epidural abscess. 2. No significant disc herniation, spinal canal stenosis or neural foraminal narrowing. 3. Straightening of the expected cervical lordosis. 4. Mild cervical dextrocurvature, possibly positional. MRI thoracic  spine: 1. No evidence of discitis/osteomyelitis or epidural abscess. 2. 5 mm round T2 STIR hyperintense and T1 hypointense lesion within the left aspect of the T6 vertebral body with possible subtle peripheral enhancement. The imaging features are nonspecific and this lesion is indeterminate in etiology. Short-interval, 3-6 month MRI follow-up is recommended to ensure stability. 3. No significant disc herniation, spinal canal stenosis or neural foraminal narrowing. 4. Mild thoracic dextrocurvature, possibly positional. 5. Bilateral pleural effusions (small-to-moderate right, small left). Electronically Signed   By: Jackey Loge D.O.   On: 12/10/2020 10:28   MR THORACIC SPINE W WO CONTRAST  Result Date: 12/10/2020 CLINICAL DATA:  Back pain. 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 EXAM: MRI CERVICAL AND THORACIC SPINE WITHOUT AND WITH CONTRAST TECHNIQUE: Multiplanar and multiecho pulse sequences of the cervical spine, to include the craniocervical junction and cervicothoracic junction, and the thoracic spine, were obtained without and with intravenous contrast. CONTRAST:  2mL GADAVIST GADOBUTROL 1 MMOL/ML IV SOLN COMPARISON:  CT angiogram chest 12/05/2020. FINDINGS: MRI CERVICAL SPINE FINDINGS Alignment: Straightening of the expected cervical lordosis. Mild cervical dextrocurvature, possibly positional. No significant spondylolisthesis. Vertebrae: Vertebral body height is maintained. No significant marrow edema or focal suspicious osseous lesion. Cord: No signal abnormality identified within the cervical spinal cord. No abnormal enhancement within the spinal canal. Posterior Fossa, vertebral arteries, paraspinal tissues: No abnormality identified within included portions of the posterior fossa. Flow voids preserved within the imaged cervical vertebral arteries.  Paraspinal soft tissues unremarkable. Disc levels: No significant disc herniation, spinal canal stenosis or neural foraminal narrowing. MRI THORACIC SPINE FINDINGS Alignment:  Mild thoracic dextrocurvature.  No spondylolisthesis. Vertebrae: Vertebral body height is maintained. Nonspecific 5 mm round T2 STIR hyperintense and T1 hypointense lesion within the left aspect of the T6  vertebral body with possible subtle peripheral enhancement (for instance as seen on series 12, image 9) (series 15, image 19) (series 19, image 9). There are no findings to suggest discitis/osteomyelitis. Cord: No signal abnormality identified within the thoracic spinal cord. No abnormal enhancement within the thoracic spinal canal. Paraspinal and other soft tissues: Bilateral pleural effusions (small to moderate right, small left). No abnormality identified within included portions of the upper abdomen/retroperitoneum. Paraspinal soft tissues unremarkable. Disc levels: Intervertebral disc height and hydration are maintained throughout the thoracic spine. No significant disc herniation, spinal canal stenosis or neural foraminal narrowing. IMPRESSION: MRI cervical spine: 1. No evidence of discitis/osteomyelitis or epidural abscess. 2. No significant disc herniation, spinal canal stenosis or neural foraminal narrowing. 3. Straightening of the expected cervical lordosis. 4. Mild cervical dextrocurvature, possibly positional. MRI thoracic spine: 1. No evidence of discitis/osteomyelitis or epidural abscess. 2. 5 mm round T2 STIR hyperintense and T1 hypointense lesion within the left aspect of the T6 vertebral body with possible subtle peripheral enhancement. The imaging features are nonspecific and this lesion is indeterminate in etiology. Short-interval, 3-6 month MRI follow-up is recommended to ensure stability. 3. No significant disc herniation, spinal canal stenosis or neural foraminal narrowing. 4. Mild thoracic dextrocurvature, possibly  positional. 5. Bilateral pleural effusions (small-to-moderate right, small left). Electronically Signed   By: Kellie Simmering D.O.   On: 12/10/2020 10:28   CT MAXILLOFACIAL W CONTRAST  Result Date: 12/06/2020 CLINICAL DATA:  Facial swelling EXAM: CT MAXILLOFACIAL WITH CONTRAST TECHNIQUE: Multidetector CT imaging of the maxillofacial structures was performed with intravenous contrast. Multiplanar CT image reconstructions were also generated. CONTRAST:  45mL OMNIPAQUE IOHEXOL 300 MG/ML  SOLN COMPARISON:  None. FINDINGS: Osseous: Is Orbits: The globes are intact. Normal appearance of the intra- and extraconal fat. Symmetric extraocular muscles. Sinuses: No fluid levels or advanced mucosal thickening. Soft tissues: Normal visualized extracranial soft tissues. Limited intracranial: Normal. Other: None. IMPRESSION: Normal maxillofacial CT. Electronically Signed   By: Ulyses Jarred M.D.   On: 12/06/2020 21:31   DG CHEST PORT 1 VIEW  Result Date: 12/12/2020 CLINICAL DATA:  Chest pain EXAM: PORTABLE CHEST 1 VIEW COMPARISON:  12/05/2020 chest radiograph. FINDINGS: Right PICC terminates at the cavoatrial junction. Stable cardiomediastinal silhouette with normal heart size. No pneumothorax. No pleural effusion. Lungs appear clear, with no acute consolidative airspace disease and no pulmonary edema. IMPRESSION: No active disease. Electronically Signed   By: Ilona Sorrel M.D.   On: 12/12/2020 11:19   DG Abd Portable 1V  Result Date: 12/26/2020 CLINICAL DATA:  35 year old female with abdominal pain and constipation. EXAM: PORTABLE ABDOMEN - 1 VIEW COMPARISON:  CT Abdomen and Pelvis 09/15/2019. FINDINGS: Portable AP view at 0914 hours. Non obstructed bowel gas pattern. But there is a large volume of retained stool in the large bowel, progressed from the CT last year. Other visible abdominal and pelvic visceral contours appear normal. Mild dextroconvex scoliosis but otherwise no osseous abnormality identified. IMPRESSION:  Non obstructed bowel gas pattern but large volume of retained stool in the large bowel. Electronically Signed   By: Genevie Ann M.D.   On: 12/26/2020 09:26   ECHOCARDIOGRAM COMPLETE  Result Date: 12/05/2020    ECHOCARDIOGRAM REPORT   Patient Name:   Summer Cain Date of Exam: 12/05/2020 Medical Rec #:  [id number]       Height:       61.0 in Accession #:    [id number]      Weight:       99.2  lb Date of Birth:  [birth year]       BSA:          1.402 m Patient Age:    35 years        BP:           84/52 mmHg Patient Gender: F               HR:           69 bpm. Exam Location:  Inpatient Procedure: 2D Echo, Cardiac Doppler and Color Doppler Indications:    Endocarditis  History:        Patient has prior history of Echocardiogram examinations, most                 recent 09/17/2020. Signs/Symptoms:Chest Pain.  Sonographer:    Glo Herring Referring Phys: [id number] TIMOTHY S OPYD  Sonographer Comments: Technically difficult due to patient being supine IMPRESSIONS  1. 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 Recommend TEE to better assess.  2. Left ventricular ejection fraction, by estimation, is 55 to 60%. The left ventricle has normal function. The left ventricle has no regional wall motion abnormalities. Left ventricular diastolic parameters were normal.  3. Right ventricular systolic function is normal. The right ventricular size is normal. There is normal pulmonary artery systolic pressure.  4. The anterior mitral valve leaflet is thickened and there is a mobile density (1.37 x 0.82 cm) on the ventricular side of the anterior leaflet.. The mitral valve is normal in structure. Mild mitral valve regurgitation. No evidence of mitral stenosis.  5. There is thickening on the atrial side of the tricuspid valve septal leaflet. Measures 1.3 x 1.5 cm. Findings concerning for endocarditis.  6. The aortic valve is tricuspid. Aortic valve regurgitation is not  visualized. No aortic stenosis is present.  7. The aortic root appears thickened. 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  8. The inferior vena cava is dilated in size with >50% respiratory variability, suggesting right atrial pressure of 8 mmHg. FINDINGS  Left Ventricle: Left ventricular ejection fraction, by estimation, is 55 to 60%. The left ventricle has normal function. The left ventricle has no regional wall motion abnormalities. The left ventricular internal cavity size was normal in size. There is  no left ventricular hypertrophy. Left ventricular diastolic parameters were normal. Normal left ventricular filling pressure. Right Ventricle: The right ventricular size is normal. No increase in right ventricular wall thickness. Right ventricular systolic function is normal. There is normal pulmonary artery systolic pressure. The tricuspid regurgitant velocity is 1.90 m/s, and  with an assumed right atrial pressure of 8 mmHg, the estimated right ventricular systolic pressure is [id number] mmHg. Left Atrium: Left atrial size was normal in size. Right Atrium: Right atrial size was normal in size. Pericardium: There is no evidence of pericardial effusion. Mitral Valve: The anterior mitral valve leaflet is thickened and there is a mobile density (1.37 x 0.82 cm) on the ventricular side of the anterior leaflet. The mitral valve is normal in structure. Mild mitral valve regurgitation. No evidence of mitral valve stenosis. Tricuspid Valve: There is thickening on the atrial side of the tricuspid valve septal leaflet. Measures 1.3 x 1.5 cm. Findings concerning for endocarditis. The tricuspid valve is normal in structure. Tricuspid valve regurgitation is trivial. No evidence of tricuspid stenosis. Aortic Valve: The aortic valve is tricuspid. Aortic valve regurgitation is not visualized. No aortic stenosis is present. Pulmonic Valve: The  pulmonic valve was normal in structure. Pulmonic valve regurgitation is not visualized. No  evidence of pulmonic stenosis. Aorta: The aortic root appears thickened. 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 The aortic root is normal in size and structure. Venous: The inferior vena cava is dilated in size with greater than 50% respiratory variability, suggesting right atrial pressure of 8 mmHg. IAS/Shunts: No atrial level shunt detected by color flow Doppler.  LEFT VENTRICLE PLAX 2D LVIDd:         4.50 cm     Diastology LVIDs:         2.70 cm     LV e' medial:    10.90 cm/s LV PW:         1.10 cm     LV E/e' medial:  9.6 LV IVS:        0.60 cm     LV e' lateral:   9.90 cm/s LVOT diam:     1.90 cm     LV E/e' lateral: 10.6 LVOT Area:     2.84 cm  LV Volumes (MOD) LV vol d, MOD A2C: 58.1 ml LV vol d, MOD A4C: 64.3 ml LV vol s, MOD A2C: 28.4 ml LV vol s, MOD A4C: 28.8 ml LV SV MOD A2C:     29.8 ml LV SV MOD A4C:     64.3 ml LV SV MOD BP:      34.5 ml IVC IVC diam: 2.50 cm LEFT ATRIUM             Index LA diam:        3.20 cm 2.28 cm/m LA Vol (A2C):   36.0 ml 25.68 ml/m LA Vol (A4C):   35.0 ml 24.96 ml/m LA Biplane Vol: 38.6 ml 27.53 ml/m                        PULMONIC VALVE AORTA                 PV Vmax:       0.99 m/s Ao Root diam: 2.60 cm PV Peak grad:  3.9 mmHg  MITRAL VALVE                TRICUSPID VALVE MV Area (PHT): 4.80 cm     TR Peak grad:   14.4 mmHg MV Decel Time: 158 msec     TR Vmax:        190.00 cm/s MV E velocity: 105.00 cm/s MV A velocity: 47.10 cm/s   SHUNTS MV E/A ratio:  2.23         Systemic Diam: 1.90 cm Skeet Latch MD Electronically signed by Skeet Latch MD Signature Date/Time: 12/05/2020/3:05:49 PM    Final    ECHO TEE  Result Date: 12/10/2020    TRANSESOPHOGEAL ECHO REPORT   Patient Name:   Summer Cain Date of Exam: 12/10/2020 Medical Rec #:  [id number]       Height:       60.0 in Accession #:    [id number]      Weight:       109.3 lb Date of Birth:  03-14-1985       BSA:          1.444 m Patient Age:    35 years        BP:           117/60 mmHg Patient Gender: F  HR:           103 bpm. Exam Location:  Inpatient Procedure: 3D Echo, Transesophageal Echo, Color Doppler and Cardiac Doppler Indications:    Endocarditis  History:        Patient has prior history of Echocardiogram examinations, most                 recent 12/05/2020. Endocarditis; Signs/Symptoms:Chest Pain.                 IVDU. ETOH. Hepatitis.  Sonographer:    Roseanna Rainbow RDCS Referring Phys: [id number] [place] PROCEDURE: After discussion of the risks and benefits of a TEE, an informed consent was obtained from the patient. The patient was intubated. The transesophogeal probe was passed without difficulty through the esophogus of the patient. Imaged were obtained with the patient in a supine position. Sedation performed by different physician. The patient was monitored while under deep sedation. Anesthestetic sedation was provided intravenously by Anesthesiology: 257mg  of Propofol. The patient's vital signs; including heart rate, blood pressure, and oxygen saturation; remained stable throughout the procedure. The patient developed no complications during the procedure. IMPRESSIONS  1. Left ventricular ejection fraction, by estimation, is 60 to 65%. The left ventricle has normal function.  2. Right ventricular systolic function is normal. The right ventricular size is normal.  3. No left atrial/left atrial appendage thrombus was detected.  4. There are small mobile denisities visualized on the anterior and posterior mitral valve leaflets (along A1/P1). Given clinical context of bacteremia and IV drug use, findings concerning for underlying vegetations. Trivial mitral regurgitation.  5. There is thickening of the tricuspid valve septal leaflet with small fibrinous stranding visualized. Findings concerning for vegetations given clinical context as detailed above. There is trace tricuspid regurgitation.  6. The aortic valve is tricuspid. Aortic valve regurgitation is not visualized. Suspect density  seen on transgastric view was due to off-axis view of aortic valve as is not independently mobile and is the same tissue density of the aortic valve. Density  not seen on alternative views with lower suspicion of vegetation especially given that there is no aortic regurgitation.  7. There is a mobile density visualized on the pulmonary valve. While this may be related to artifact, cannot rule out vegetation. There is trivial pulmonary regurgitation.  8. Normal aortic root without evidence of root abscess.  9. Overall, there are vegetations visualized on the mitral and tricuspid valves without evidence of significant valve destruction. There is no aortic root abscess. FINDINGS  Left Ventricle: Left ventricular ejection fraction, by estimation, is 60 to 65%. The left ventricle has normal function. The left ventricular internal cavity size was normal in size. Right Ventricle: The right ventricular size is normal. No increase in right ventricular wall thickness. Right ventricular systolic function is normal. Left Atrium: Left atrial size was normal in size. No left atrial/left atrial appendage thrombus was detected. Right Atrium: Right atrial size was normal in size. Pericardium: There is no evidence of pericardial effusion. Mitral Valve: There are small mobile denisities visualized on the anterior and posterior mitral valve leaflets (along A1/P1). Given clinical context of bacteremia and IV drug use, findings concerning for underlying vegetations. Trivial mitral regurgitation. Tricuspid Valve: There is thickening of the tricuspid valve septal leaflet with small fibrinous stranding visualized. Findings concerning for vegetations given clinical context as detailed above. There is trace tricuspid regurgitation.  Aortic Valve: \ The aortic valve is tricuspid. Aortic valve regurgitation is not visualized. Suspect density  seen on transgastric view was due to off-axis view of AoV as is not independently mobile and is the same  tissue density of the AoV. Density not seen on alternative views with lower suspicion of vegetation especially given that there is no AR.  Aorta: Normal aortic root without evidence of abscess. Pulmonary Artery: There is a mobile density visualized on the pulmonary valve. While this may be related to artifact, cannot rule out vegetation. There is trivial pulmonary regurgitation. IAS/Shunts: No atrial level shunt detected by color flow Doppler. Gwyndolyn Kaufman MD Electronically signed by Gwyndolyn Kaufman MD Signature Date/Time: 12/10/2020/5:15:29 PM    Final    [place] EKG SITE RITE  Result Date: 12/09/2020 If Site Rite image not attached, placement could not be confirmed due to current cardiac rhythm.  CT Extrem Up Entire Arm L WO/W CM  Result Date: 12/05/2020 CLINICAL DATA:  Concern for cellulitis of the left upper extremity. EXAM: CT OF THE UPPER LEFT EXTREMITY WITHOUT AND WITH CONTRAST TECHNIQUE: Multidetector CT imaging of the upper left extremity was performed following the standard protocol before and during bolus administration of intravenous contrast. COMPARISON:  None. CONTRAST:  25mL OMNIPAQUE IOHEXOL 350 MG/ML SOLN FINDINGS: Bones/Joint/Cartilage There is no acute fracture or dislocation. The bones are well mineralized. No arthritic changes. No joint effusion. Ligaments Suboptimally assessed by CT. Muscles and Tendons There is intramuscular edema. Soft tissues There is diffuse subcutaneous edema of the left upper extremity consistent with cellulitis. No drainable fluid collection/abscess. There is a long segment thrombosed vein involving the medial left upper extremity extending from the distal forearm into the axilla consistent with long segment DVT. This appears to involve the brachial vein extending into the axillary vein. No drainable fluid collection/abscess. IMPRESSION: 1. Long segment venous thrombus extending into the left axillary vein at the junction of the subclavian vein consistent  with DVT. Associated inflammatory changes indicative of thrombophlebitis. Further evaluation with dedicated duplex ultrasound recommended. 2. No drainable fluid collection/abscess. These results were called by telephone at the time of interpretation on 12/05/2020 at 3:18 am to provider [facility] , who verbally acknowledged these results. Electronically Signed   By: Anner Crete M.D.   On: 12/05/2020 03:23   (Echo, Carotid, EGD, Colonoscopy, ERCP)    Subjective: I had examined patient in the morning.  Late evening hours, I was called by the nursing staff that patient is walking out of the hospital and signed [place].  She did not wait to talk to this provider.  PICC line was taken out.  Patient walked out of the unit.    Discharge Exam: Vitals:   12/27/20 1133 12/27/20 1648  BP:  (!) 107/57  Pulse:  86  Resp: 18 16  Temp: 98.6 F (37 C) 98.1 F (36.7 C)  SpO2:  100%   Vitals:   12/27/20 0439 12/27/20 0837 12/27/20 1133 12/27/20 1648  BP: (!) 140/56 (!) 90/55  (!) 107/57  Pulse: 60 72  86  Resp: 17 18 18 16   Temp: 98.2 F (36.8 C) 97.7 F (36.5 C) 98.6 F (37 C) 98.1 F (36.7 C)  TempSrc: Oral Oral Oral Oral  SpO2: 98% 98%  100%  Weight:      Height:           The results of significant diagnostics from this hospitalization (including imaging, microbiology, ancillary and laboratory) are listed below for reference.     Microbiology: Recent Results (from the past 240 hour(s))  Resp Panel by RT-PCR (  Flu A&B, Covid) Throat     Status: None   Collection Time: 12/22/20 11:32 AM   Specimen: Throat; Nasopharyngeal(NP) swabs in vial transport medium  Result Value Ref Range Status   SARS Coronavirus 2 by RT PCR NEGATIVE NEGATIVE Final    Comment: (NOTE) SARS-CoV-2 target nucleic acids are NOT DETECTED.  The SARS-CoV-2 RNA is generally detectable in upper respiratory specimens during the acute phase of infection. The lowest concentration of SARS-CoV-2  viral copies this assay can detect is 138 copies/mL. A negative result does not preclude SARS-Cov-2 infection and should not be used as the sole basis for treatment or other patient management decisions. A negative result may occur with  improper specimen collection/handling, submission of specimen other than nasopharyngeal swab, presence of viral mutation(s) within the areas targeted by this assay, and inadequate number of viral copies(<138 copies/mL). A negative result must be combined with clinical observations, patient history, and epidemiological information. The expected result is Negative.  Fact Sheet for Patients:  BloggerCourse.comhttps://www.fda.gov/media/152166/download  Fact Sheet for Healthcare Providers:  SeriousBroker.ithttps://www.fda.gov/media/152162/download  This test is no t yet approved or cleared by the [place] FDA and  has been authorized for detection and/or diagnosis of SARS-CoV-2 by FDA under an Emergency Use Authorization (EUA). This EUA will remain  in effect (meaning this test can be used) for the duration of the COVID-19 declaration under Section 564(b)(1) of the Act, 21 U.S.C.section 360bbb-3(b)(1), unless the authorization is terminated  or revoked sooner.       Influenza A by PCR NEGATIVE NEGATIVE Final   Influenza B by PCR NEGATIVE NEGATIVE Final    Comment: (NOTE) The Xpert Xpress SARS-CoV-2/FLU/RSV plus assay is intended as an aid in the diagnosis of influenza from Nasopharyngeal swab specimens and should not be used as a sole basis for treatment. Nasal washings and aspirates are unacceptable for Xpert Xpress SARS-CoV-2/FLU/RSV testing.  Fact Sheet for Patients: BloggerCourse.comhttps://www.fda.gov/media/152166/download  Fact Sheet for Healthcare Providers: SeriousBroker.ithttps://www.fda.gov/media/152162/download  This test is not yet approved or cleared by the [place] FDA and has been authorized for detection and/or diagnosis of SARS-CoV-2 by FDA under an Emergency Use Authorization  (EUA). This EUA will remain in effect (meaning this test can be used) for the duration of the COVID-19 declaration under Section 564(b)(1) of the Act, 21 U.S.C. section 360bbb-3(b)(1), unless the authorization is terminated or revoked.  Performed at [facility], [address] [place], [place] [address]   Group A Strep by PCR     Status: None   Collection Time: 12/22/20 11:32 AM   Specimen: Throat; Sterile Swab  Result Value Ref Range Status   Group A Strep by PCR NOT DETECTED NOT DETECTED Final    Comment: Performed at [facility], [address] [place], [place] [address]     Labs: BNP (last 3 results) No results for input(s): BNP in the last 8760 hours. Basic Metabolic Panel: Recent Labs  Lab 12/23/20 0427  NA 130*  K 5.5*  CL 95*  CO2 28  GLUCOSE 273*  BUN 9  CREATININE 0.67  CALCIUM 8.5*  MG 1.7   Liver Function Tests: Recent Labs  Lab 12/23/20 0427  AST 19  ALT 20  ALKPHOS 67  BILITOT 0.4  PROT 5.9*  ALBUMIN 2.4*   No results for input(s): LIPASE, AMYLASE in the last 168 hours. Recent Labs  Lab 12/25/20 1503  AMMONIA 39*   CBC: Recent Labs  Lab 12/23/20 0427  WBC 4.8  HGB 9.9*  HCT 31.1*  MCV 89.6  PLT 251   Cardiac Enzymes: No results for input(s): CKTOTAL, CKMB, CKMBINDEX, TROPONINI in the last 168 hours. BNP: Invalid input(s): POCBNP CBG: No results for input(s): GLUCAP in the last 168 hours. D-Dimer No results for input(s): DDIMER in the last 72 hours. Hgb A1c No results for input(s): HGBA1C in the last 72 hours. Lipid Profile No results for input(s): CHOL, HDL, LDLCALC, TRIG, CHOLHDL, LDLDIRECT in the last 72 hours. Thyroid function studies No results for input(s): TSH, T4TOTAL, T3FREE, THYROIDAB in the last 72 hours.  Invalid input(s): FREET3 Anemia work up No results for input(s): VITAMINB12, FOLATE, FERRITIN, TIBC, IRON, RETICCTPCT in the last 72 hours. Urinalysis    Component Value Date/Time   COLORURINE  AMBER (A) 12/05/2020 0029   APPEARANCEUR HAZY (A) 12/05/2020 0029   LABSPEC 1.028 12/05/2020 0029   PHURINE 6.0 12/05/2020 0029   GLUCOSEU NEGATIVE 12/05/2020 0029   HGBUR NEGATIVE 12/05/2020 0029   BILIRUBINUR NEGATIVE 12/05/2020 0029   KETONESUR NEGATIVE 12/05/2020 0029   PROTEINUR 30 (A) 12/05/2020 0029   NITRITE NEGATIVE 12/05/2020 0029   LEUKOCYTESUR NEGATIVE 12/05/2020 0029   Sepsis Labs Invalid input(s): PROCALCITONIN,  WBC,  LACTICIDVEN Microbiology Recent Results (from the past 240 hour(s))  Resp Panel by RT-PCR (Flu A&B, Covid) Throat     Status: None   Collection Time: 12/22/20 11:32 AM   Specimen: Throat; Nasopharyngeal(NP) swabs in vial transport medium  Result Value Ref Range Status   SARS Coronavirus 2 by RT PCR NEGATIVE NEGATIVE Final    Comment: (NOTE) SARS-CoV-2 target nucleic acids are NOT DETECTED.  The SARS-CoV-2 RNA is generally detectable in upper respiratory specimens during the acute phase of infection. The lowest concentration of SARS-CoV-2 viral copies this assay can detect is 138 copies/mL. A negative result does not preclude SARS-Cov-2 infection and should not be used as the sole basis for treatment or other patient management decisions. A negative result may occur with  improper specimen collection/handling, submission of specimen other than nasopharyngeal swab, presence of viral mutation(s) within the areas targeted by this assay, and inadequate number of viral copies(<138 copies/mL). A negative result must be combined with clinical observations, patient history, and epidemiological information. The expected result is Negative.  Fact Sheet for Patients:  EntrepreneurPulse.com.au  Fact Sheet for Healthcare Providers:  IncredibleEmployment.be  This test is no t yet approved or cleared by the [place] FDA and  has been authorized for detection and/or diagnosis of SARS-CoV-2 by FDA under an Emergency Use  Authorization (EUA). This EUA will remain  in effect (meaning this test can be used) for the duration of the COVID-19 declaration under Section 564(b)(1) of the Act, 21 U.S.C.section 360bbb-3(b)(1), unless the authorization is terminated  or revoked sooner.       Influenza A by PCR NEGATIVE NEGATIVE Final   Influenza B by PCR NEGATIVE NEGATIVE Final    Comment: (NOTE) The Xpert Xpress SARS-CoV-2/FLU/RSV plus assay is intended as an aid in the diagnosis of influenza from Nasopharyngeal swab specimens and should not be used as a sole basis for treatment. Nasal washings and aspirates are unacceptable for Xpert Xpress SARS-CoV-2/FLU/RSV testing.  Fact Sheet for Patients: EntrepreneurPulse.com.au  Fact Sheet for Healthcare Providers: IncredibleEmployment.be  This test is not yet approved or cleared by the [place] FDA and has been authorized for detection and/or diagnosis of SARS-CoV-2 by FDA under an Emergency Use Authorization (EUA). This EUA will remain in effect (meaning this test can be used) for the  duration of the COVID-19 declaration under Section 564(b)(1) of the Act, 21 U.S.C. section 360bbb-3(b)(1), unless the authorization is terminated or revoked.  Performed at [facility], [place] [address] [place], [place] [address]   Group A Strep by PCR     Status: None   Collection Time: 12/22/20 11:32 AM   Specimen: Throat; Sterile Swab  Result Value Ref Range Status   Group A Strep by PCR NOT DETECTED NOT DETECTED Final    Comment: Performed at [facility], [place] [address], [place], [place] [address]     Time coordinating discharge:  0 minutes  SIGNED:   Barb Merino, MD  [facility] 12/29/2020, 1:44 PM

## 2021-01-07 ENCOUNTER — Other Ambulatory Visit (HOSPITAL_COMMUNITY): Payer: Self-pay

## 2021-01-08 ENCOUNTER — Telehealth: Payer: Self-pay

## 2021-01-08 NOTE — Telephone Encounter (Signed)
[facility] Patient Advocate Encounter  Patient's medication(Apretude) have been couriered to [facility] from [facility] and will be administered on the patient next office visit on 01/21/21.  Clearance Coots , CPhT Specialty Pharmacy Patient [facility] Phone: [phone] Fax:  [phone]

## 2021-01-21 ENCOUNTER — Ambulatory Visit: Payer: Medicaid Other | Admitting: Internal Medicine

## 2021-02-02 ENCOUNTER — Other Ambulatory Visit (HOSPITAL_COMMUNITY): Payer: Self-pay

## 2021-02-23 ENCOUNTER — Ambulatory Visit: Payer: Medicaid Other | Admitting: Family Medicine

## 2022-01-27 ENCOUNTER — Other Ambulatory Visit (HOSPITAL_COMMUNITY): Payer: Self-pay

## 2022-02-10 ENCOUNTER — Emergency Department (HOSPITAL_COMMUNITY)
Admission: EM | Admit: 2022-02-10 | Discharge: 2022-02-10 | Discharge status: Against Medical Advice | Payer: Medicaid Other | Attending: Emergency Medicine | Admitting: Emergency Medicine

## 2022-02-10 ENCOUNTER — Emergency Department (HOSPITAL_COMMUNITY): Payer: Medicaid Other

## 2022-02-10 DIAGNOSIS — U071 COVID-19: Secondary | ICD-10-CM | POA: Diagnosis not present

## 2022-02-10 DIAGNOSIS — Z5321 Procedure and treatment not carried out due to patient leaving prior to being seen by health care provider: Secondary | ICD-10-CM | POA: Diagnosis not present

## 2022-02-10 DIAGNOSIS — R059 Cough, unspecified: Secondary | ICD-10-CM | POA: Diagnosis present

## 2022-02-10 LAB — URINALYSIS, ROUTINE W REFLEX MICROSCOPIC
Bilirubin Urine: NEGATIVE
Glucose, UA: NEGATIVE mg/dL
Hgb urine dipstick: NEGATIVE
Ketones, ur: NEGATIVE mg/dL
Nitrite: POSITIVE — AB
Protein, ur: 30 mg/dL — AB
Specific Gravity, Urine: 1.019 (ref 1.005–1.030)
pH: 5 (ref 5.0–8.0)

## 2022-02-10 LAB — RAPID URINE DRUG SCREEN, HOSP PERFORMED
Amphetamines: NOT DETECTED
Barbiturates: NOT DETECTED
Benzodiazepines: NOT DETECTED
Cocaine: POSITIVE — AB
Opiates: NOT DETECTED
Tetrahydrocannabinol: POSITIVE — AB

## 2022-02-10 LAB — RESP PANEL BY RT-PCR (RSV, FLU A&B, COVID)  RVPGX2
Influenza A by PCR: NEGATIVE
Influenza B by PCR: NEGATIVE
Resp Syncytial Virus by PCR: NEGATIVE
SARS Coronavirus 2 by RT PCR: POSITIVE — AB

## 2022-02-10 LAB — TROPONIN I (HIGH SENSITIVITY): Troponin I (High Sensitivity): 4 ng/L (ref <18)
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
## 2022-02-10 NOTE — ED Notes (Signed)
Attempted to get bloodwork. Patient no veins, states she has to have ultrasound and usually ends up with a PICC line. JRPRN

## 2022-03-27 ENCOUNTER — Emergency Department (HOSPITAL_COMMUNITY)
Admission: EM | Admit: 2022-03-27 | Discharge: 2022-03-27 | Disposition: A | Discharge status: Home / Self Care | Payer: Medicaid Other | Attending: Emergency Medicine | Admitting: Emergency Medicine

## 2022-03-27 ENCOUNTER — Encounter (HOSPITAL_COMMUNITY): Payer: Self-pay

## 2022-03-27 DIAGNOSIS — T50901A Poisoning by unspecified drugs, medicaments and biological substances, accidental (unintentional), initial encounter: Secondary | ICD-10-CM | POA: Diagnosis not present

## 2022-03-27 DIAGNOSIS — R4182 Altered mental status, unspecified: Secondary | ICD-10-CM | POA: Diagnosis not present

## 2022-03-27 DIAGNOSIS — K047 Periapical abscess without sinus: Secondary | ICD-10-CM

## 2022-03-27 DIAGNOSIS — L089 Local infection of the skin and subcutaneous tissue, unspecified: Secondary | ICD-10-CM

## 2022-03-27 DIAGNOSIS — F29 Unspecified psychosis not due to a substance or known physiological condition: Secondary | ICD-10-CM | POA: Diagnosis not present

## 2022-03-27 DIAGNOSIS — Z91040 Latex allergy status: Secondary | ICD-10-CM | POA: Diagnosis not present

## 2022-03-27 LAB — I-STAT VENOUS BLOOD GAS, ED
Acid-base deficit: 2 mmol/L (ref 0.0–2.0)
Bicarbonate: 25.9 mmol/L (ref 20.0–28.0)
Calcium, Ion: 1.16 mmol/L (ref 1.15–1.40)
HCT: 42 % (ref 36.0–46.0)
Hemoglobin: 14.3 g/dL (ref 12.0–15.0)
O2 Saturation: 63 %
Potassium: 3.1 mmol/L — ABNORMAL LOW (ref 3.5–5.1)
Sodium: 131 mmol/L — ABNORMAL LOW (ref 135–145)
TCO2: 28 mmol/L (ref 22–32)
pCO2, Ven: 55.8 mmHg (ref 44–60)
pH, Ven: 7.274 (ref 7.25–7.43)
pO2, Ven: 38 mmHg (ref 32–45)

## 2022-03-27 LAB — CBC WITH DIFFERENTIAL/PLATELET
Abs Immature Granulocytes: 0.02 10*3/uL (ref 0.00–0.07)
Basophils Absolute: 0 10*3/uL (ref 0.0–0.1)
Basophils Relative: 0 %
Eosinophils Absolute: 0.1 10*3/uL (ref 0.0–0.5)
Eosinophils Relative: 1 %
HCT: 40 % (ref 36.0–46.0)
Hemoglobin: 13.7 g/dL (ref 12.0–15.0)
Immature Granulocytes: 0 %
Lymphocytes Relative: 24 %
Lymphs Abs: 2 10*3/uL (ref 0.7–4.0)
MCH: 30 pg (ref 26.0–34.0)
MCHC: 34.3 g/dL (ref 30.0–36.0)
MCV: 87.7 fL (ref 80.0–100.0)
Monocytes Absolute: 0.9 10*3/uL (ref 0.1–1.0)
Monocytes Relative: 10 %
Neutro Abs: 5.3 10*3/uL (ref 1.7–7.7)
Neutrophils Relative %: 65 %
Platelets: 289 10*3/uL (ref 150–400)
RBC: 4.56 MIL/uL (ref 3.87–5.11)
RDW: 12.7 % (ref 11.5–15.5)
WBC: 8.2 10*3/uL (ref 4.0–10.5)
nRBC: 0 % (ref 0.0–0.2)

## 2022-03-27 LAB — RAPID URINE DRUG SCREEN, HOSP PERFORMED
Amphetamines: POSITIVE — AB
Barbiturates: NOT DETECTED
Benzodiazepines: NOT DETECTED
Cocaine: POSITIVE — AB
Opiates: NOT DETECTED
Tetrahydrocannabinol: POSITIVE — AB

## 2022-03-27 LAB — COMPREHENSIVE METABOLIC PANEL
ALT: 33 U/L (ref 0–44)
AST: 47 U/L — ABNORMAL HIGH (ref 15–41)
Albumin: 4 g/dL (ref 3.5–5.0)
Alkaline Phosphatase: 60 U/L (ref 38–126)
Anion gap: 17 — ABNORMAL HIGH (ref 5–15)
BUN: 12 mg/dL (ref 6–20)
CO2: 21 mmol/L — ABNORMAL LOW (ref 22–32)
Calcium: 8.7 mg/dL — ABNORMAL LOW (ref 8.9–10.3)
Chloride: 94 mmol/L — ABNORMAL LOW (ref 98–111)
Creatinine, Ser: 0.8 mg/dL (ref 0.44–1.00)
GFR, Estimated: >60 mL/min (ref >60)
Glucose, Bld: 96 mg/dL (ref 70–99)
Potassium: 3.1 mmol/L — ABNORMAL LOW (ref 3.5–5.1)
Sodium: 132 mmol/L — ABNORMAL LOW (ref 135–145)
Total Bilirubin: 1.7 mg/dL — ABNORMAL HIGH (ref 0.3–1.2)
Total Protein: 6.9 g/dL (ref 6.5–8.1)

## 2022-03-27 LAB — I-STAT BETA HCG BLOOD, ED (MC, WL, AP ONLY): I-stat hCG, quantitative: <5 m[IU]/mL (ref <5)

## 2022-03-27 LAB — AMMONIA: Ammonia: 22 umol/L (ref 9–35)

## 2022-03-27 MED ORDER — ONDANSETRON HCL 4 MG/2ML IJ SOLN
4.0000 mg | Freq: Once | INTRAMUSCULAR | Status: AC
  Administered 2022-03-27: 4 mg via INTRAVENOUS
  Filled 2022-03-27: qty 2

## 2022-03-27 MED ORDER — SODIUM CHLORIDE 0.9 % IV BOLUS
1000.0000 mL | Freq: Once | INTRAVENOUS | Status: AC
  Administered 2022-03-27: 1000 mL via INTRAVENOUS

## 2022-03-27 MED ORDER — POTASSIUM CHLORIDE CRYS ER 20 MEQ PO TBCR
40.0000 meq | EXTENDED_RELEASE_TABLET | Freq: Once | ORAL | Status: AC
  Administered 2022-03-27: 40 meq via ORAL
  Filled 2022-03-27: qty 2

## 2022-03-27 MED ORDER — CLINDAMYCIN HCL 300 MG PO CAPS: 300.0000 mg | ORAL_CAPSULE | Freq: Three times a day (TID) | ORAL | 0 refills | Status: DC

## 2022-03-27 MED ORDER — KETOROLAC TROMETHAMINE 30 MG/ML IJ SOLN
30.0000 mg | Freq: Once | INTRAMUSCULAR | Status: AC
  Administered 2022-03-27: 30 mg via INTRAVENOUS
  Filled 2022-03-27: qty 1

## 2022-03-27 MED ORDER — CLINDAMYCIN HCL 150 MG PO CAPS
300.0000 mg | ORAL_CAPSULE | Freq: Once | ORAL | Status: AC
  Administered 2022-03-27: 300 mg via ORAL
  Filled 2022-03-27: qty 2

## 2022-03-27 MED ORDER — NALOXONE HCL 0.4 MG/ML IJ SOLN
0.4000 mg | Freq: Once | INTRAMUSCULAR | Status: AC
  Administered 2022-03-27: 0.4 mg via INTRAVENOUS
  Filled 2022-03-27: qty 1

## 2022-03-27 NOTE — ED Notes (Addendum)
Pt slightly more awake after receiving narcan, has not had an apneic spell.  Pt c/o being cold and shivering, as well as teeth pain. Pt upset because she couldn't find her cell phone. When looking for it, pt pulled out wet pack of cigarettes and bag of weed.
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
## 2022-03-27 NOTE — ED Notes (Signed)
Patient states she just went to bathroom. Unable to void at this time.
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
## 2022-03-27 NOTE — ED Notes (Signed)
Patient filled a bag with hospital socks and hospital supply with the intent of taking with her upon discharge. Personal belongings were checked  and hospital supply including needles were taken away. Patient become verbally aggressive  and very agitated. Patient was  walked out to lobby to catch the cab.

## 2022-07-17 ENCOUNTER — Encounter (HOSPITAL_COMMUNITY): Payer: Self-pay | Admitting: *Deleted

## 2022-07-17 ENCOUNTER — Other Ambulatory Visit: Payer: Self-pay

## 2022-07-17 ENCOUNTER — Emergency Department (HOSPITAL_COMMUNITY)
Admission: EM | Admit: 2022-07-17 | Discharge: 2022-07-17 | Disposition: A | Discharge status: Home / Self Care | Payer: 59 | Attending: Emergency Medicine | Admitting: Emergency Medicine

## 2022-07-17 DIAGNOSIS — R4182 Altered mental status, unspecified: Secondary | ICD-10-CM | POA: Insufficient documentation

## 2022-07-17 DIAGNOSIS — Z5321 Procedure and treatment not carried out due to patient leaving prior to being seen by health care provider: Secondary | ICD-10-CM | POA: Insufficient documentation

## 2022-07-17 DIAGNOSIS — T7421XA Adult sexual abuse, confirmed, initial encounter: Secondary | ICD-10-CM | POA: Diagnosis not present

## 2022-07-17 HISTORY — DX: Attention-deficit hyperactivity disorder, unspecified type: F90.9

## 2022-07-17 HISTORY — DX: Anxiety disorder, unspecified: F41.9

## 2022-07-17 HISTORY — DX: Obsessive-compulsive disorder, unspecified: F42.9

## 2022-07-17 HISTORY — DX: Dissociative identity disorder: F44.81

## 2022-07-17 HISTORY — DX: Bipolar disorder, current episode manic without psychotic features, moderate: F31.12

## 2022-07-17 NOTE — ED Notes (Signed)
Off-duty [employer] officer made this technician aware, that this patient left AMA. Dragging OTF.
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
## 2022-07-17 NOTE — ED Notes (Signed)
Per [employer] officer, he told pt to leave the property because she was attempting to use drugs on the premises.  Pt had left 2 pairs of shoes on the stretcher.  Shoes labeled and placed at front desk.
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
## 2022-12-17 ENCOUNTER — Other Ambulatory Visit: Payer: Self-pay

## 2023-01-08 ENCOUNTER — Other Ambulatory Visit: Payer: Self-pay

## 2023-01-08 ENCOUNTER — Emergency Department (HOSPITAL_COMMUNITY): Payer: 59

## 2023-01-08 ENCOUNTER — Other Ambulatory Visit (HOSPITAL_COMMUNITY): Payer: 59

## 2023-01-08 ENCOUNTER — Inpatient Hospital Stay (HOSPITAL_COMMUNITY)
Admission: EM | Admit: 2023-01-08 | Discharge: 2023-01-28 | DRG: 094 | Disposition: A | Discharge status: Home / Self Care | Payer: 59 | Attending: Internal Medicine | Admitting: Internal Medicine

## 2023-01-08 DIAGNOSIS — Z91040 Latex allergy status: Secondary | ICD-10-CM

## 2023-01-08 DIAGNOSIS — S79911A Unspecified injury of right hip, initial encounter: Secondary | ICD-10-CM | POA: Diagnosis not present

## 2023-01-08 DIAGNOSIS — F909 Attention-deficit hyperactivity disorder, unspecified type: Secondary | ICD-10-CM | POA: Diagnosis present

## 2023-01-08 DIAGNOSIS — M4627 Osteomyelitis of vertebra, lumbosacral region: Secondary | ICD-10-CM | POA: Diagnosis not present

## 2023-01-08 DIAGNOSIS — Z8619 Personal history of other infectious and parasitic diseases: Secondary | ICD-10-CM

## 2023-01-08 DIAGNOSIS — E43 Unspecified severe protein-calorie malnutrition: Secondary | ICD-10-CM | POA: Insufficient documentation

## 2023-01-08 DIAGNOSIS — G061 Intraspinal abscess and granuloma: Principal | ICD-10-CM | POA: Diagnosis present

## 2023-01-08 DIAGNOSIS — R000 Tachycardia, unspecified: Secondary | ICD-10-CM | POA: Diagnosis not present

## 2023-01-08 DIAGNOSIS — Z79899 Other long term (current) drug therapy: Secondary | ICD-10-CM

## 2023-01-08 DIAGNOSIS — S3992XA Unspecified injury of lower back, initial encounter: Secondary | ICD-10-CM | POA: Diagnosis not present

## 2023-01-08 DIAGNOSIS — F39 Unspecified mood [affective] disorder: Secondary | ICD-10-CM | POA: Diagnosis present

## 2023-01-08 DIAGNOSIS — Z86718 Personal history of other venous thrombosis and embolism: Secondary | ICD-10-CM

## 2023-01-08 DIAGNOSIS — M545 Low back pain, unspecified: Secondary | ICD-10-CM | POA: Diagnosis not present

## 2023-01-08 DIAGNOSIS — D649 Anemia, unspecified: Secondary | ICD-10-CM | POA: Diagnosis present

## 2023-01-08 DIAGNOSIS — M4647 Discitis, unspecified, lumbosacral region: Secondary | ICD-10-CM

## 2023-01-08 DIAGNOSIS — Z2981 Encounter for HIV pre-exposure prophylaxis: Secondary | ICD-10-CM

## 2023-01-08 DIAGNOSIS — Z8679 Personal history of other diseases of the circulatory system: Secondary | ICD-10-CM

## 2023-01-08 DIAGNOSIS — F429 Obsessive-compulsive disorder, unspecified: Secondary | ICD-10-CM | POA: Diagnosis present

## 2023-01-08 DIAGNOSIS — T3795XA Adverse effect of unspecified systemic anti-infective and antiparasitic, initial encounter: Secondary | ICD-10-CM

## 2023-01-08 DIAGNOSIS — M869 Osteomyelitis, unspecified: Principal | ICD-10-CM

## 2023-01-08 DIAGNOSIS — R64 Cachexia: Secondary | ICD-10-CM | POA: Diagnosis present

## 2023-01-08 DIAGNOSIS — Z7409 Other reduced mobility: Secondary | ICD-10-CM | POA: Diagnosis present

## 2023-01-08 DIAGNOSIS — D638 Anemia in other chronic diseases classified elsewhere: Secondary | ICD-10-CM | POA: Diagnosis present

## 2023-01-08 DIAGNOSIS — Z83438 Family history of other disorder of lipoprotein metabolism and other lipidemia: Secondary | ICD-10-CM

## 2023-01-08 DIAGNOSIS — M4626 Osteomyelitis of vertebra, lumbar region: Secondary | ICD-10-CM | POA: Diagnosis present

## 2023-01-08 DIAGNOSIS — Z681 Body mass index (BMI) 19 or less, adult: Secondary | ICD-10-CM

## 2023-01-08 DIAGNOSIS — G4700 Insomnia, unspecified: Secondary | ICD-10-CM | POA: Diagnosis present

## 2023-01-08 DIAGNOSIS — R252 Cramp and spasm: Secondary | ICD-10-CM | POA: Diagnosis not present

## 2023-01-08 DIAGNOSIS — F17290 Nicotine dependence, other tobacco product, uncomplicated: Secondary | ICD-10-CM | POA: Diagnosis present

## 2023-01-08 DIAGNOSIS — Z8249 Family history of ischemic heart disease and other diseases of the circulatory system: Secondary | ICD-10-CM

## 2023-01-08 DIAGNOSIS — Z818 Family history of other mental and behavioral disorders: Secondary | ICD-10-CM

## 2023-01-08 DIAGNOSIS — F419 Anxiety disorder, unspecified: Secondary | ICD-10-CM | POA: Diagnosis present

## 2023-01-08 DIAGNOSIS — F1120 Opioid dependence, uncomplicated: Secondary | ICD-10-CM | POA: Diagnosis present

## 2023-01-08 DIAGNOSIS — E876 Hypokalemia: Secondary | ICD-10-CM | POA: Insufficient documentation

## 2023-01-08 DIAGNOSIS — B1920 Unspecified viral hepatitis C without hepatic coma: Secondary | ICD-10-CM | POA: Diagnosis present

## 2023-01-08 DIAGNOSIS — F1910 Other psychoactive substance abuse, uncomplicated: Secondary | ICD-10-CM | POA: Diagnosis present

## 2023-01-08 DIAGNOSIS — B9620 Unspecified Escherichia coli [E. coli] as the cause of diseases classified elsewhere: Secondary | ICD-10-CM | POA: Diagnosis present

## 2023-01-08 DIAGNOSIS — Z87442 Personal history of urinary calculi: Secondary | ICD-10-CM

## 2023-01-08 DIAGNOSIS — B182 Chronic viral hepatitis C: Secondary | ICD-10-CM | POA: Diagnosis present

## 2023-01-08 DIAGNOSIS — A5900 Urogenital trichomoniasis, unspecified: Secondary | ICD-10-CM | POA: Diagnosis present

## 2023-01-08 DIAGNOSIS — A530 Latent syphilis, unspecified as early or late: Secondary | ICD-10-CM | POA: Insufficient documentation

## 2023-01-08 DIAGNOSIS — M4646 Discitis, unspecified, lumbar region: Secondary | ICD-10-CM | POA: Diagnosis not present

## 2023-01-08 DIAGNOSIS — Z8739 Personal history of other diseases of the musculoskeletal system and connective tissue: Secondary | ICD-10-CM

## 2023-01-08 DIAGNOSIS — M419 Scoliosis, unspecified: Secondary | ICD-10-CM | POA: Diagnosis present

## 2023-01-08 DIAGNOSIS — F1990 Other psychoactive substance use, unspecified, uncomplicated: Secondary | ICD-10-CM | POA: Diagnosis present

## 2023-01-08 DIAGNOSIS — Z833 Family history of diabetes mellitus: Secondary | ICD-10-CM

## 2023-01-08 DIAGNOSIS — A599 Trichomoniasis, unspecified: Secondary | ICD-10-CM

## 2023-01-08 DIAGNOSIS — Z91410 Personal history of adult physical and sexual abuse: Secondary | ICD-10-CM

## 2023-01-08 DIAGNOSIS — D75838 Other thrombocytosis: Secondary | ICD-10-CM | POA: Diagnosis present

## 2023-01-08 DIAGNOSIS — Z206 Contact with and (suspected) exposure to human immunodeficiency virus [HIV]: Secondary | ICD-10-CM | POA: Diagnosis present

## 2023-01-08 DIAGNOSIS — F1123 Opioid dependence with withdrawal: Secondary | ICD-10-CM | POA: Diagnosis not present

## 2023-01-08 DIAGNOSIS — G959 Disease of spinal cord, unspecified: Secondary | ICD-10-CM | POA: Diagnosis not present

## 2023-01-08 DIAGNOSIS — R9431 Abnormal electrocardiogram [ECG] [EKG]: Secondary | ICD-10-CM | POA: Diagnosis present

## 2023-01-08 DIAGNOSIS — T360X5A Adverse effect of penicillins, initial encounter: Secondary | ICD-10-CM | POA: Diagnosis not present

## 2023-01-08 DIAGNOSIS — F4481 Dissociative identity disorder: Secondary | ICD-10-CM | POA: Diagnosis present

## 2023-01-08 DIAGNOSIS — M549 Dorsalgia, unspecified: Secondary | ICD-10-CM | POA: Diagnosis not present

## 2023-01-08 DIAGNOSIS — F3132 Bipolar disorder, current episode depressed, moderate: Secondary | ICD-10-CM | POA: Diagnosis present

## 2023-01-08 DIAGNOSIS — F17210 Nicotine dependence, cigarettes, uncomplicated: Secondary | ICD-10-CM | POA: Diagnosis present

## 2023-01-08 DIAGNOSIS — M4640 Discitis, unspecified, site unspecified: Secondary | ICD-10-CM | POA: Diagnosis present

## 2023-01-08 DIAGNOSIS — D75839 Thrombocytosis, unspecified: Secondary | ICD-10-CM | POA: Diagnosis present

## 2023-01-08 DIAGNOSIS — Z5941 Food insecurity: Secondary | ICD-10-CM

## 2023-01-08 DIAGNOSIS — F1510 Other stimulant abuse, uncomplicated: Secondary | ICD-10-CM | POA: Diagnosis present

## 2023-01-08 DIAGNOSIS — F1410 Cocaine abuse, uncomplicated: Secondary | ICD-10-CM | POA: Diagnosis present

## 2023-01-08 DIAGNOSIS — M40204 Unspecified kyphosis, thoracic region: Secondary | ICD-10-CM | POA: Diagnosis not present

## 2023-01-08 DIAGNOSIS — M25551 Pain in right hip: Secondary | ICD-10-CM | POA: Diagnosis not present

## 2023-01-08 HISTORY — DX: Other psychoactive substance use, unspecified, uncomplicated: F19.90

## 2023-01-08 HISTORY — DX: Acute and subacute infective endocarditis: I33.0

## 2023-01-08 HISTORY — DX: Rheumatic mitral valve disease, unspecified: I05.9

## 2023-01-08 HISTORY — DX: Rheumatic tricuspid valve disease, unspecified: I07.9

## 2023-01-08 LAB — COMPREHENSIVE METABOLIC PANEL
ALT: 14 U/L (ref 0–44)
AST: 13 U/L — ABNORMAL LOW (ref 15–41)
Albumin: 2.8 g/dL — ABNORMAL LOW (ref 3.5–5.0)
Alkaline Phosphatase: 66 U/L (ref 38–126)
Anion gap: 14 (ref 5–15)
BUN: 9 mg/dL (ref 6–20)
CO2: 23 mmol/L (ref 22–32)
Calcium: 9.2 mg/dL (ref 8.9–10.3)
Chloride: 99 mmol/L (ref 98–111)
Creatinine, Ser: 0.61 mg/dL (ref 0.44–1.00)
GFR, Estimated: >60 mL/min (ref >60)
Glucose, Bld: 92 mg/dL (ref 70–99)
Potassium: 3.6 mmol/L (ref 3.5–5.1)
Sodium: 136 mmol/L (ref 135–145)
Total Bilirubin: 0.8 mg/dL (ref <1.2)
Total Protein: 7.9 g/dL (ref 6.5–8.1)

## 2023-01-08 LAB — CBC WITH DIFFERENTIAL/PLATELET
Abs Immature Granulocytes: 0.04 10*3/uL (ref 0.00–0.07)
Basophils Absolute: 0 10*3/uL (ref 0.0–0.1)
Basophils Relative: 0 %
Eosinophils Absolute: 0.1 10*3/uL (ref 0.0–0.5)
Eosinophils Relative: 1 %
HCT: 33.4 % — ABNORMAL LOW (ref 36.0–46.0)
Hemoglobin: 10.5 g/dL — ABNORMAL LOW (ref 12.0–15.0)
Immature Granulocytes: 0 %
Lymphocytes Relative: 16 %
Lymphs Abs: 1.5 10*3/uL (ref 0.7–4.0)
MCH: 26.1 pg (ref 26.0–34.0)
MCHC: 31.4 g/dL (ref 30.0–36.0)
MCV: 83.1 fL (ref 80.0–100.0)
Monocytes Absolute: 0.4 10*3/uL (ref 0.1–1.0)
Monocytes Relative: 4 %
Neutro Abs: 7.6 10*3/uL (ref 1.7–7.7)
Neutrophils Relative %: 79 %
Platelets: 580 10*3/uL — ABNORMAL HIGH (ref 150–400)
RBC: 4.02 MIL/uL (ref 3.87–5.11)
RDW: 13.5 % (ref 11.5–15.5)
WBC: 9.5 10*3/uL (ref 4.0–10.5)
nRBC: 0 % (ref 0.0–0.2)

## 2023-01-08 LAB — SEDIMENTATION RATE: Sed Rate: 93 mm/h — ABNORMAL HIGH (ref 0–22)

## 2023-01-08 LAB — HCG, QUANTITATIVE, PREGNANCY: hCG, Beta Chain, Quant, S: 1 m[IU]/mL (ref <5)

## 2023-01-08 LAB — C-REACTIVE PROTEIN: CRP: 5.8 mg/dL — ABNORMAL HIGH (ref <1.0)

## 2023-01-08 LAB — CK: Total CK: 26 U/L — ABNORMAL LOW (ref 38–234)

## 2023-01-08 MED ORDER — FENTANYL CITRATE PF 50 MCG/ML IJ SOSY
50.0000 ug | PREFILLED_SYRINGE | Freq: Once | INTRAMUSCULAR | Status: AC
  Administered 2023-01-08: 50 ug via INTRAVENOUS
  Filled 2023-01-08: qty 1

## 2023-01-08 MED ORDER — MORPHINE SULFATE (PF) 4 MG/ML IV SOLN
4.0000 mg | Freq: Once | INTRAVENOUS | Status: AC
  Administered 2023-01-08: 4 mg via INTRAVENOUS
  Filled 2023-01-08: qty 1

## 2023-01-08 MED ORDER — MORPHINE SULFATE (PF) 4 MG/ML IV SOLN
6.0000 mg | Freq: Once | INTRAVENOUS | Status: AC
  Administered 2023-01-08: 6 mg via INTRAVENOUS
  Filled 2023-01-08: qty 2

## 2023-01-08 MED ORDER — LORAZEPAM 2 MG/ML IJ SOLN
1.0000 mg | Freq: Once | INTRAMUSCULAR | Status: AC | PRN
  Administered 2023-01-10: 1 mg via INTRAVENOUS
  Filled 2023-01-08: qty 1
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
## 2023-01-08 NOTE — ED Notes (Signed)
Pt continues to refuse to keep BP cuff in place and continuously ripping it off. Pt educated on need for appropriate vital signs with medication administration. Pt screaming so loudly she can be heard from nurse's station, pt demanding pain medication at this time, pt writhing in the bed at this time. Attempt made to assess approximate drug use pt does daily, and pt uncooperative and refusing to answer. Provider updated.

## 2023-01-08 NOTE — ED Notes (Addendum)
Pt refused to ambulate
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
## 2023-01-08 NOTE — ED Notes (Addendum)
Pt refusing to let the BP cuff run. She screams and flails her arms around and has removed the cuff after trying twice.

## 2023-01-08 NOTE — ED Notes (Signed)
Pt has taken off all monitoring. This paramedic asked pt to leave it on while she is on the pain medications.

## 2023-01-08 NOTE — ED Notes (Signed)
Pt refuses to leave the blood pressure cuff on. She will not stop moving around when it is running to get an actual reading.

## 2023-01-09 ENCOUNTER — Encounter (HOSPITAL_COMMUNITY): Payer: Self-pay

## 2023-01-09 ENCOUNTER — Emergency Department (HOSPITAL_COMMUNITY): Payer: 59

## 2023-01-09 DIAGNOSIS — M4646 Discitis, unspecified, lumbar region: Secondary | ICD-10-CM

## 2023-01-09 DIAGNOSIS — Z206 Contact with and (suspected) exposure to human immunodeficiency virus [HIV]: Secondary | ICD-10-CM | POA: Diagnosis present

## 2023-01-09 DIAGNOSIS — D75839 Thrombocytosis, unspecified: Secondary | ICD-10-CM

## 2023-01-09 DIAGNOSIS — R9431 Abnormal electrocardiogram [ECG] [EKG]: Secondary | ICD-10-CM | POA: Diagnosis present

## 2023-01-09 DIAGNOSIS — A5900 Urogenital trichomoniasis, unspecified: Secondary | ICD-10-CM | POA: Diagnosis present

## 2023-01-09 DIAGNOSIS — F3132 Bipolar disorder, current episode depressed, moderate: Secondary | ICD-10-CM | POA: Diagnosis not present

## 2023-01-09 DIAGNOSIS — F39 Unspecified mood [affective] disorder: Secondary | ICD-10-CM | POA: Diagnosis not present

## 2023-01-09 DIAGNOSIS — F1510 Other stimulant abuse, uncomplicated: Secondary | ICD-10-CM | POA: Diagnosis present

## 2023-01-09 DIAGNOSIS — T360X5A Adverse effect of penicillins, initial encounter: Secondary | ICD-10-CM | POA: Diagnosis not present

## 2023-01-09 DIAGNOSIS — M549 Dorsalgia, unspecified: Secondary | ICD-10-CM | POA: Diagnosis present

## 2023-01-09 DIAGNOSIS — F1410 Cocaine abuse, uncomplicated: Secondary | ICD-10-CM | POA: Diagnosis not present

## 2023-01-09 DIAGNOSIS — M869 Osteomyelitis, unspecified: Secondary | ICD-10-CM | POA: Diagnosis not present

## 2023-01-09 DIAGNOSIS — G959 Disease of spinal cord, unspecified: Secondary | ICD-10-CM | POA: Diagnosis not present

## 2023-01-09 DIAGNOSIS — Z681 Body mass index (BMI) 19 or less, adult: Secondary | ICD-10-CM | POA: Diagnosis not present

## 2023-01-09 DIAGNOSIS — Z86718 Personal history of other venous thrombosis and embolism: Secondary | ICD-10-CM

## 2023-01-09 DIAGNOSIS — M4626 Osteomyelitis of vertebra, lumbar region: Secondary | ICD-10-CM | POA: Diagnosis present

## 2023-01-09 DIAGNOSIS — B9689 Other specified bacterial agents as the cause of diseases classified elsewhere: Secondary | ICD-10-CM | POA: Diagnosis not present

## 2023-01-09 DIAGNOSIS — A599 Trichomoniasis, unspecified: Secondary | ICD-10-CM | POA: Diagnosis not present

## 2023-01-09 DIAGNOSIS — R64 Cachexia: Secondary | ICD-10-CM | POA: Diagnosis not present

## 2023-01-09 DIAGNOSIS — D649 Anemia, unspecified: Secondary | ICD-10-CM | POA: Diagnosis not present

## 2023-01-09 DIAGNOSIS — F419 Anxiety disorder, unspecified: Secondary | ICD-10-CM | POA: Diagnosis present

## 2023-01-09 DIAGNOSIS — A530 Latent syphilis, unspecified as early or late: Secondary | ICD-10-CM | POA: Diagnosis not present

## 2023-01-09 DIAGNOSIS — F1120 Opioid dependence, uncomplicated: Secondary | ICD-10-CM | POA: Diagnosis not present

## 2023-01-09 DIAGNOSIS — R252 Cramp and spasm: Secondary | ICD-10-CM | POA: Diagnosis not present

## 2023-01-09 DIAGNOSIS — G061 Intraspinal abscess and granuloma: Secondary | ICD-10-CM | POA: Diagnosis not present

## 2023-01-09 DIAGNOSIS — F909 Attention-deficit hyperactivity disorder, unspecified type: Secondary | ICD-10-CM | POA: Diagnosis present

## 2023-01-09 DIAGNOSIS — M4627 Osteomyelitis of vertebra, lumbosacral region: Secondary | ICD-10-CM | POA: Diagnosis not present

## 2023-01-09 DIAGNOSIS — M4647 Discitis, unspecified, lumbosacral region: Secondary | ICD-10-CM | POA: Diagnosis not present

## 2023-01-09 DIAGNOSIS — E876 Hypokalemia: Secondary | ICD-10-CM | POA: Diagnosis present

## 2023-01-09 DIAGNOSIS — F1910 Other psychoactive substance abuse, uncomplicated: Secondary | ICD-10-CM | POA: Diagnosis not present

## 2023-01-09 DIAGNOSIS — Z8679 Personal history of other diseases of the circulatory system: Secondary | ICD-10-CM | POA: Diagnosis not present

## 2023-01-09 DIAGNOSIS — D638 Anemia in other chronic diseases classified elsewhere: Secondary | ICD-10-CM | POA: Diagnosis not present

## 2023-01-09 DIAGNOSIS — F17210 Nicotine dependence, cigarettes, uncomplicated: Secondary | ICD-10-CM | POA: Diagnosis present

## 2023-01-09 DIAGNOSIS — M4640 Discitis, unspecified, site unspecified: Secondary | ICD-10-CM | POA: Diagnosis present

## 2023-01-09 DIAGNOSIS — M40204 Unspecified kyphosis, thoracic region: Secondary | ICD-10-CM | POA: Diagnosis not present

## 2023-01-09 DIAGNOSIS — B182 Chronic viral hepatitis C: Secondary | ICD-10-CM | POA: Diagnosis not present

## 2023-01-09 DIAGNOSIS — F1990 Other psychoactive substance use, unspecified, uncomplicated: Secondary | ICD-10-CM | POA: Diagnosis not present

## 2023-01-09 DIAGNOSIS — F1123 Opioid dependence with withdrawal: Secondary | ICD-10-CM | POA: Diagnosis not present

## 2023-01-09 DIAGNOSIS — Z8739 Personal history of other diseases of the musculoskeletal system and connective tissue: Secondary | ICD-10-CM

## 2023-01-09 DIAGNOSIS — R011 Cardiac murmur, unspecified: Secondary | ICD-10-CM | POA: Diagnosis not present

## 2023-01-09 DIAGNOSIS — E43 Unspecified severe protein-calorie malnutrition: Secondary | ICD-10-CM | POA: Diagnosis not present

## 2023-01-09 DIAGNOSIS — F4481 Dissociative identity disorder: Secondary | ICD-10-CM | POA: Diagnosis present

## 2023-01-09 LAB — ETHANOL: Alcohol, Ethyl (B): <10 mg/dL (ref <10)

## 2023-01-09 LAB — RPR
RPR Ser Ql: REACTIVE — AB
RPR Titer: 1:16 {titer}

## 2023-01-09 LAB — HIV ANTIBODY (ROUTINE TESTING W REFLEX): HIV Screen 4th Generation wRfx: NONREACTIVE

## 2023-01-09 LAB — HEPATITIS PANEL, ACUTE
HCV Ab: REACTIVE — AB
Hep A IgM: NONREACTIVE
Hep B C IgM: NONREACTIVE
Hepatitis B Surface Ag: NONREACTIVE

## 2023-01-09 LAB — PREALBUMIN: Prealbumin: 7 mg/dL — ABNORMAL LOW (ref 18–38)

## 2023-01-09 MED ORDER — ALBUTEROL SULFATE (2.5 MG/3ML) 0.083% IN NEBU: 2.5000 mg | INHALATION_SOLUTION | Freq: Four times a day (QID) | RESPIRATORY_TRACT | Status: DC | PRN

## 2023-01-09 MED ORDER — VANCOMYCIN HCL 1250 MG/250ML IV SOLN
1250.0000 mg | Freq: Once | INTRAVENOUS | Status: AC
  Administered 2023-01-09: 1250 mg via INTRAVENOUS
  Filled 2023-01-09: qty 250

## 2023-01-09 MED ORDER — LACTATED RINGERS IV BOLUS
1000.0000 mL | Freq: Once | INTRAVENOUS | Status: AC
  Administered 2023-01-09: 1000 mL via INTRAVENOUS

## 2023-01-09 MED ORDER — NICOTINE 21 MG/24HR TD PT24
21.0000 mg | MEDICATED_PATCH | Freq: Every day | TRANSDERMAL | Status: DC
Start: 2023-01-09 — End: 2023-01-26
  Administered 2023-01-10 – 2023-01-16 (×5): 21 mg via TRANSDERMAL
  Filled 2023-01-09 (×12): qty 1

## 2023-01-09 MED ORDER — ACETAMINOPHEN 325 MG PO TABS
650.0000 mg | ORAL_TABLET | Freq: Four times a day (QID) | ORAL | Status: DC | PRN
  Administered 2023-01-09 – 2023-01-25 (×12): 650 mg via ORAL
  Filled 2023-01-09 (×12): qty 2

## 2023-01-09 MED ORDER — SODIUM CHLORIDE 0.9 % IV SOLN
2.0000 g | Freq: Three times a day (TID) | INTRAVENOUS | Status: DC
  Administered 2023-01-09 – 2023-01-10 (×4): 2 g via INTRAVENOUS
  Filled 2023-01-09 (×4): qty 12.5

## 2023-01-09 MED ORDER — OXYCODONE-ACETAMINOPHEN 5-325 MG PO TABS
1.0000 | ORAL_TABLET | Freq: Four times a day (QID) | ORAL | Status: DC | PRN
  Administered 2023-01-09 – 2023-01-10 (×5): 1 via ORAL
  Filled 2023-01-09 (×5): qty 1

## 2023-01-09 MED ORDER — VANCOMYCIN HCL 500 MG/100ML IV SOLN
500.0000 mg | Freq: Two times a day (BID) | INTRAVENOUS | Status: DC
Start: 2023-01-09 — End: 2023-01-10
  Administered 2023-01-09 – 2023-01-10 (×2): 500 mg via INTRAVENOUS
  Filled 2023-01-09 (×2): qty 100

## 2023-01-09 MED ORDER — SODIUM CHLORIDE 0.9% FLUSH
3.0000 mL | Freq: Two times a day (BID) | INTRAVENOUS | Status: DC
  Administered 2023-01-09 – 2023-01-28 (×26): 3 mL via INTRAVENOUS

## 2023-01-09 MED ORDER — ACETAMINOPHEN 650 MG RE SUPP: 650.0000 mg | Freq: Four times a day (QID) | RECTAL | Status: DC | PRN

## 2023-01-09 MED ORDER — ENSURE ENLIVE PO LIQD
237.0000 mL | Freq: Two times a day (BID) | ORAL | Status: DC
  Administered 2023-01-10 – 2023-01-11 (×2): 237 mL via ORAL

## 2023-01-09 MED ORDER — GABAPENTIN 100 MG PO CAPS
200.0000 mg | ORAL_CAPSULE | Freq: Two times a day (BID) | ORAL | Status: DC
  Administered 2023-01-09 – 2023-01-25 (×32): 200 mg via ORAL
  Filled 2023-01-09 (×32): qty 2

## 2023-01-09 MED ORDER — MELATONIN 5 MG PO TABS
5.0000 mg | ORAL_TABLET | Freq: Every evening | ORAL | Status: AC | PRN
  Administered 2023-01-09 – 2023-01-11 (×3): 5 mg via ORAL
  Filled 2023-01-09 (×3): qty 1

## 2023-01-09 MED ORDER — ONDANSETRON HCL 4 MG PO TABS: 4.0000 mg | ORAL_TABLET | Freq: Four times a day (QID) | ORAL | Status: DC | PRN

## 2023-01-09 MED ORDER — DIAZEPAM 5 MG/ML IJ SOLN
10.0000 mg | Freq: Once | INTRAMUSCULAR | Status: AC
  Administered 2023-01-09: 10 mg via INTRAVENOUS
  Filled 2023-01-09: qty 2

## 2023-01-09 MED ORDER — DROPERIDOL 2.5 MG/ML IJ SOLN
1.2500 mg | Freq: Once | INTRAMUSCULAR | Status: AC
  Administered 2023-01-09: 1.25 mg via INTRAVENOUS
  Filled 2023-01-09: qty 2

## 2023-01-09 MED ORDER — ONDANSETRON HCL 4 MG/2ML IJ SOLN: 4.0000 mg | Freq: Four times a day (QID) | INTRAMUSCULAR | Status: DC | PRN

## 2023-01-09 MED ORDER — LORAZEPAM 2 MG/ML IJ SOLN
1.0000 mg | Freq: Once | INTRAMUSCULAR | Status: AC
  Administered 2023-01-09: 1 mg via INTRAVENOUS
  Filled 2023-01-09: qty 1

## 2023-01-09 MED ORDER — LIDOCAINE 5 % EX PTCH
1.0000 | MEDICATED_PATCH | CUTANEOUS | Status: AC
  Administered 2023-01-09: 1 via TRANSDERMAL
  Filled 2023-01-09: qty 1

## 2023-01-09 MED ORDER — TRIMETHOBENZAMIDE HCL 100 MG/ML IM SOLN: 200.0000 mg | Freq: Four times a day (QID) | INTRAMUSCULAR | Status: DC | PRN

## 2023-01-09 MED ORDER — GADOBUTROL 1 MMOL/ML IV SOLN
5.0000 mL | Freq: Once | INTRAVENOUS | Status: AC | PRN
  Administered 2023-01-09: 5 mL via INTRAVENOUS

## 2023-01-09 MED ORDER — ENOXAPARIN SODIUM 40 MG/0.4ML IJ SOSY
40.0000 mg | PREFILLED_SYRINGE | INTRAMUSCULAR | Status: DC
  Administered 2023-01-10 – 2023-01-27 (×17): 40 mg via SUBCUTANEOUS
  Filled 2023-01-09 (×19): qty 0.4

## 2023-01-09 NOTE — Progress Notes (Addendum)
Pharmacy Antibiotic Note  Summer Cain is a 37 y.o. female admitted on 01/08/2023 with osteomyelitis. PMH includes IV drug abuse, history of endocarditis and septic emboli  Pharmacy has been consulted for vancomycin dosing.  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 No thoracic spine infection  -WBC 9.5, sCr 0.61, afebrile -No antibiotics given in ED  Plan: -Cefepime 2g IV every 8 hours -Vancomycin 1250mg  IV x1 -Vancomycin 500mg  IV every 12 hours (AUC 459, Vd 0.72, IBW) -Monitor renal function -Follow up signs of clinical improvement, LOT, de-escalation of antibiotics -F/u surgical plans     Temp (24hrs), Avg:98 F (36.7 C), Min:97.6 F (36.4 C), Max:98.3 F (36.8 C)  Recent Labs  Lab 01/08/23 1253  WBC 9.5  CREATININE 0.61    CrCl cannot be calculated (Unknown ideal weight.).    Allergies  Allergen Reactions   Latex Swelling    Antimicrobials this admission: Vancomycin 12/22 >>   Microbiology results: 12/21 BCx:   Thank you for allowing pharmacy to be a part of this patient's care.  Arabella Merles, PharmD. Clinical Pharmacist 01/09/2023 5:24 AM

## 2023-01-09 NOTE — ED Provider Notes (Signed)
  Physical Exam  BP 106/80   Pulse 81   Temp 98.2 F (36.8 C) (Oral)   Resp (!) 21   SpO2 100%   Physical Exam Vitals and nursing note reviewed.  Constitutional:      General: She is not in acute distress.    Appearance: She is well-developed.  HENT:     Head: Normocephalic and atraumatic.  Eyes:     Conjunctiva/sclera: Conjunctivae normal.  Cardiovascular:     Rate and Rhythm: Normal rate and regular rhythm.     Heart sounds: No murmur heard. Pulmonary:     Effort: Pulmonary effort is normal. No respiratory distress.     Breath sounds: Normal breath sounds.  Abdominal:     Palpations: Abdomen is soft.     Tenderness: There is no abdominal tenderness.  Musculoskeletal:        General: Tenderness present. No swelling.     Cervical back: Neck supple.  Skin:    General: Skin is warm and dry.     Capillary Refill: Capillary refill takes less than 2 seconds.  Neurological:     Mental Status: She is alert.  Psychiatric:        Mood and Affect: Mood normal.     Procedures  Procedures  ED Course / MDM    Medical Decision Making Amount and/or Complexity of Data Reviewed Labs: ordered. Radiology: ordered.  Risk Prescription drug management. Decision regarding hospitalization.   Patient received in handoff.  IV drug use, severe back pain pending MRI.  Significant difficulty performing this on last provider shift.  Required multiple medications for pain control to get the patient to lie flat.  Ultimately we were able to obtain an MRI that shows osteomyelitis/discitis of L5-S1 with intervertebral purulence decompressing eventually through the disc with a right paracentral protrusion and ventral epidural phlegmon impinging on the right S1 nerve root.  Neurosurgery consulted Dr Conchita Paris who is recommending medical therapy only with spectrum antibiotics there is no cord compromise.  Vancomycin initiated and patient will require hospital admission       Glendora Score,  MD 01/09/23 [phone]
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
## 2023-01-10 ENCOUNTER — Inpatient Hospital Stay (HOSPITAL_COMMUNITY): Payer: 59

## 2023-01-10 DIAGNOSIS — F39 Unspecified mood [affective] disorder: Secondary | ICD-10-CM | POA: Diagnosis not present

## 2023-01-10 DIAGNOSIS — D649 Anemia, unspecified: Secondary | ICD-10-CM

## 2023-01-10 DIAGNOSIS — B9689 Other specified bacterial agents as the cause of diseases classified elsewhere: Secondary | ICD-10-CM

## 2023-01-10 DIAGNOSIS — R011 Cardiac murmur, unspecified: Secondary | ICD-10-CM | POA: Diagnosis not present

## 2023-01-10 DIAGNOSIS — Z86718 Personal history of other venous thrombosis and embolism: Secondary | ICD-10-CM | POA: Diagnosis not present

## 2023-01-10 DIAGNOSIS — G061 Intraspinal abscess and granuloma: Secondary | ICD-10-CM | POA: Diagnosis not present

## 2023-01-10 DIAGNOSIS — F1910 Other psychoactive substance abuse, uncomplicated: Secondary | ICD-10-CM | POA: Diagnosis not present

## 2023-01-10 DIAGNOSIS — M4626 Osteomyelitis of vertebra, lumbar region: Secondary | ICD-10-CM | POA: Diagnosis not present

## 2023-01-10 DIAGNOSIS — M4646 Discitis, unspecified, lumbar region: Secondary | ICD-10-CM | POA: Diagnosis not present

## 2023-01-10 DIAGNOSIS — F1990 Other psychoactive substance use, unspecified, uncomplicated: Secondary | ICD-10-CM | POA: Diagnosis not present

## 2023-01-10 LAB — ECHOCARDIOGRAM COMPLETE
AR max vel: 1.69 cm2
AV Area VTI: 1.61 cm2
AV Area mean vel: 1.59 cm2
AV Mean grad: 2 mm[Hg]
AV Peak grad: 4.5 mm[Hg]
Ao pk vel: 1.06 m/s
Area-P 1/2: 3.87 cm2
Calc EF: 58.7 %
Height: 60 in
MV VTI: 1.5 cm2
S' Lateral: 2.3 cm
Single Plane A2C EF: 56.5 %
Single Plane A4C EF: 60.7 %
Weight: 1619.06 [oz_av]

## 2023-01-10 LAB — CBC
HCT: 33.6 % — ABNORMAL LOW (ref 36.0–46.0)
Hemoglobin: 10.3 g/dL — ABNORMAL LOW (ref 12.0–15.0)
MCH: 26.1 pg (ref 26.0–34.0)
MCHC: 30.7 g/dL (ref 30.0–36.0)
MCV: 85.3 fL (ref 80.0–100.0)
Platelets: 426 10*3/uL — ABNORMAL HIGH (ref 150–400)
RBC: 3.94 MIL/uL (ref 3.87–5.11)
RDW: 13.5 % (ref 11.5–15.5)
WBC: 5.5 10*3/uL (ref 4.0–10.5)
nRBC: 0 % (ref 0.0–0.2)

## 2023-01-10 LAB — T.PALLIDUM AB, TOTAL: T Pallidum Abs: REACTIVE — AB

## 2023-01-10 MED ORDER — LACTATED RINGERS IV BOLUS
500.0000 mL | Freq: Once | INTRAVENOUS | Status: AC
  Administered 2023-01-10: 500 mL via INTRAVENOUS

## 2023-01-10 MED ORDER — OXYCODONE-ACETAMINOPHEN 5-325 MG PO TABS
1.0000 | ORAL_TABLET | Freq: Four times a day (QID) | ORAL | Status: DC | PRN
  Administered 2023-01-11 – 2023-01-13 (×9): 2 via ORAL
  Filled 2023-01-10 (×9): qty 2

## 2023-01-10 MED ORDER — LIDOCAINE 5 % EX PTCH
1.0000 | MEDICATED_PATCH | CUTANEOUS | Status: AC
  Administered 2023-01-10: 1 via TRANSDERMAL
  Filled 2023-01-10: qty 1

## 2023-01-10 MED ORDER — LORAZEPAM 2 MG/ML IJ SOLN
0.5000 mg | Freq: Once | INTRAMUSCULAR | Status: AC | PRN
  Administered 2023-01-10: 0.5 mg via INTRAVENOUS
  Filled 2023-01-10: qty 1

## 2023-01-10 NOTE — Progress Notes (Signed)
650mg  of tylenol given to pt for pain. Let her know that her Percocet is not due until 1:07p because it is every 6hrs and just given to her at 0707a. Pt verbalized understanding and requested her anxiety medication. 1mg  of ativan given

## 2023-01-10 NOTE — Progress Notes (Signed)
This RN was informed by patient's nurse that patient fell while trying to get to the [facility]. Patient assessed by this RN. No injury noted and patient denied hitting her head. Patient A&O x4. Vitals stable. On-call notified. Patient re-educated. We continue to monitor.

## 2023-01-10 NOTE — Progress Notes (Signed)
  Echocardiogram 2D Echocardiogram has been performed.  Ocie Doyne RDCS 01/10/2023, 10:05 AM

## 2023-01-10 NOTE — TOC Initial Note (Signed)
Transition of Care (TOC) - Initial/Assessment Note   Spoke to patient at bedside. Patient from home with friend, states she can return.   Patient for aspiration tomorrow , then wait on cultures.   PT saw patient may need walker and bedside commode pending progress. Patient does not have any DME currently. TOC will continue to follow.   Patient does not have a PCP. Explained she can call number on insurance card and be provided a list of providers in network with insurance. Or closer to discharge TOC can arrange an appointment with a [facility] . Will continue to follow  Patient Details  Name: Summer Cain MRN: [id number] Date of Birth: [birth year]  Transition of Care [facility]) CM/SW Contact:    Kingsley Plan, RN Phone Number: 01/10/2023, 2:04 PM  Clinical Narrative:                   Expected Discharge Plan: Home/Self Care Barriers to Discharge: Continued Medical Work up   Patient Goals and CMS Choice Patient states their goals for this hospitalization and ongoing recovery are:: to return to home          Expected Discharge Plan and Services   Discharge Planning Services: CM Consult Post Acute Care Choice: NA                   DME Arranged: N/A DME Agency: NA       HH Arranged: NA HH Agency: NA        Prior Living Arrangements/Services   Lives with:: Roommate Patient language and need for interpreter reviewed:: Yes Do you feel safe going back to the place where you live?: Yes      Need for Family Participation in Patient Care: Yes (Comment) Care giver support system in place?: Yes (comment)   Criminal Activity/Legal Involvement Pertinent to Current Situation/Hospitalization: No - Comment as needed  Activities of Daily Living   ADL Screening (condition at time of admission) Independently performs ADLs?: No Does the patient have a NEW difficulty with bathing/dressing/toileting/self-feeding that is expected to last >3 days?: Yes (Initiates electronic  notice to provider for possible OT consult) Does the patient have a NEW difficulty with getting in/out of bed, walking, or climbing stairs that is expected to last >3 days?: Yes (Initiates electronic notice to provider for possible PT consult) Does the patient have a NEW difficulty with communication that is expected to last >3 days?: No Is the patient deaf or have difficulty hearing?: No Does the patient have difficulty seeing, even when wearing glasses/contacts?: No Does the patient have difficulty concentrating, remembering, or making decisions?: No  Permission Sought/Granted   Permission granted to share information with : No              Emotional Assessment Appearance:: Appears stated age Attitude/Demeanor/Rapport: Lethargic Affect (typically observed): Quiet Orientation: : Oriented to Self, Oriented to Place, Oriented to  Time, Oriented to Situation Alcohol / Substance Use: Illicit Drugs (patient states she is not actively using) Psych Involvement: No (comment)  Admission diagnosis:  Discitis [M46.40] Osteomyelitis, unspecified site, unspecified type [facility]) [M86.9] Patient Active Problem List   Diagnosis Date Noted   Discitis 01/09/2023   Osteomyelitis of lumbar spine (HCC) 01/09/2023   History of scoliosis 01/09/2023   History of endocarditis 01/09/2023   Normocytic anemia 01/09/2023   Thrombocytosis 01/09/2023   History of DVT (deep vein thrombosis) 01/09/2023   Prolonged QT interval 01/09/2023   Mood disorder (HCC) 01/09/2023   Constipation  Metabolic encephalopathy    Malnutrition of moderate degree 12/18/2020   Polysubstance abuse (HCC) 12/11/2020   Streptococcal bacteremia 12/11/2020   Abscess of aortic root    Endocarditis of mitral valve    Endocarditis of tricuspid valve    Thrombophlebitis 12/05/2020   Acute deep vein thrombosis (DVT) of left upper extremity (HCC) 12/05/2020   Hyponatremia 12/05/2020   Pneumonia 12/05/2020   Deep vein thrombosis (DVT)  of left upper extremity, unspecified chronicity, unspecified vein (HCC) 12/05/2020   Left arm cellulitis    Opioid use disorder 09/16/2019   Soft tissue infection 09/16/2019   Chest pain 09/15/2019   Sepsis (HCC) 08/11/2019   Cellulitis of right lower extremity 08/10/2019   Hepatitis 04/01/2019   IVDU (intravenous drug user) 04/01/2019   Bacteriuria with pyuria 04/01/2019   Hepatitis C 04/01/2019   Alcohol use disorder, severe, dependence (HCC) 06/06/2018   Bipolar affective disorder, currently depressed, moderate (HCC) 06/06/2018   Moderate cocaine use disorder (HCC) 06/06/2018   PCP:  Patient, No Pcp Per Pharmacy:   [facility] [address] [place] [address] Phone: [phone] Fax: [phone]  [facility] [address] [place] [address] Phone: [phone] Fax: [phone]     Social Drivers of Health (SDOH) Social History: SDOH Screenings   Food Insecurity: No Food Insecurity (01/10/2023)  Recent Concern: Food Insecurity - Food Insecurity Present (01/09/2023)  Housing: High Risk (01/10/2023)  Transportation Needs: No Transportation Needs (01/09/2023)  Utilities: At Risk (01/10/2023)  Social Connections: Unknown (06/02/2021)   [facility] from [facility]  Tobacco Use: High Risk (01/09/2023)   SDOH Interventions: Food Insecurity Interventions: Inpatient TOC Housing Interventions: Inpatient TOC Utilities Interventions: Inpatient TOC   Readmission Risk Interventions     No data to display

## 2023-01-10 NOTE — Progress Notes (Signed)
       Overnight   NAME: Summer Cain MRN: [id number] DOB : [birth year]  Remote coverage notification  Date of Service   01/10/2023   HPI/Events of Note    Notified by RN for fall in room.  RN states patient was found on her knees getting off the commode. Unwitnessed "possible" fall.   Notified floor coverage at [facility] (main) for bedside visit. Patient was found sleeping and not in distress She noted that she didn't hit her head No apparent injuries. No blood thinners.    Interventions/ Plan   Fall arresting mats Bed alarm on Continue all previous Attending orders      Chinita Greenland BSN MSNA MSN ACNPC-AG Acute Care Nurse Practitioner [facility]

## 2023-01-10 NOTE — Progress Notes (Signed)
   01/10/23 0953  Vitals  Temp 97.6 F (36.4 C)  Temp Source Oral  BP 94/63  MAP (mmHg) 73  BP Location Right Arm  BP Method Automatic  Patient Position (if appropriate) Lying  Pulse Rate 75  Pulse Rate Source Monitor  Resp 18  MEWS COLOR  MEWS Score Color Green  Oxygen Therapy  SpO2 100 %  O2 Device Room Air  MEWS Score  MEWS Temp 0  MEWS Systolic 1  MEWS Pulse 0  MEWS RR 0  MEWS LOC 0  MEWS Score 1

## 2023-01-10 NOTE — Progress Notes (Signed)
New IV placed. Vanc IVPB running.

## 2023-01-10 NOTE — Progress Notes (Signed)
 LR bolus given to pt.
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
## 2023-01-10 NOTE — Progress Notes (Signed)
Pt in bed resting. No complaints pf pain. Bed alarm on. Will continue to monitor pt.

## 2023-01-10 NOTE — Progress Notes (Signed)
IV team consult placed to move patients IV because it is in the [facility] in bend of arm and keeps making pump beep which aggravates the patient and she tries to get out of bed. Pt had fall last night.

## 2023-01-10 NOTE — Consult Note (Signed)
[facility]    Date of Admission:  01/08/2023    . Total days of antibiotics 3  Vancomycin 12/21 >> c  Cefepime 12/22 >> c               Reason for Consult: Lumbosacral Discitis     Referring Provider: Blake Divine  Primary Care Provider: Patient, No Pcp Per   Assessment: Summer Cain is a 37 y.o. female presenting to the hospital with:   L5-S1 Discitis/Osteomyelitis with Ventral Epidural Phlegmon -  Clinically stable w/o signs of sepsis. Will hold abx and ask IR to send for cultures at L5/S1 to help guide antimicrobial therapy. After biopsy is done would resume vancomycin + cefepime.  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  TTE Pending.  -Follow up blood cultures  -Follow up TTE results.   Substance Use Disorder -  With history of injection drug use. She is not at a point where she would like to quit.    Syphilis History -  Called state lab and she was treated with 4 weeks doxycycline in July 2024 with titer 1:512. Titers today have since reduced to 1:16 indicating adequate treatment and 4 fold reduction. This titer seems to reflect recovering treated disease. Will explore more of a sexual history when she is not in as much acute pain but at this time no further treatment needed.  HIV non-reactive   H/O Hepatitis C -  Hep C Ab remains reactive as expected --> GT 1a with VL 9 million in 2022.  -no urgency to treat, will consider outpatient after acute infectious problem resolves.    Plan: Stop antibiotics Follow blood cultures collected (though seems they were drawn after IV vanc... may not be high yield) Follow IR aspiration - would prefer off abx for 3 days or so, lower yield being done after receiving abx.  No treatment indicated for reactive RPR as titer has dropped  adequately since July  Follow TTE (expect it to be abnormal given history)     Principal Problem:   Discitis Active Problems:   IVDU (intravenous drug user)   Polysubstance abuse (HCC)   Osteomyelitis of lumbar spine (HCC)   History of scoliosis   History of endocarditis   Normocytic anemia   Thrombocytosis   History of DVT (deep vein thrombosis)   Prolonged QT interval   Mood disorder (HCC)    enoxaparin (LOVENOX) injection  40 mg Subcutaneous Q24H   feeding supplement  237 mL Oral BID BM   gabapentin  200 mg Oral BID   nicotine  21 mg Transdermal Daily   sodium chloride flush  3 mL Intravenous Q12H    HPI: Summer Cain is a 37 y.o. female admitted with back pain.  PMHx: endocarditis 2022 strep pyogenes involving native valve TV as well as what was repoted as aortic root abscess on TTE (incompletely treated), IVDU --> previously fentanyl injections.   She complains of low back pain and inability to stay still in bed. She can lift her legs and  MRI with L5/S1 osteomyelitis with abscess. She has had intermittent numbness in her feet reported in the chart to other providers. No saddle anesthesia. This pain has been subacute and worsened over 4 weeks. NO fevers / chills. WBC normal. She has  been hemodynamically stable. Started empirically on Vancomycin + Cefepime after d/w on call NSGY who recommended only medical management.  PT has seen and working with her - recommended rolling walker and BSC. Back pain is severe per her discussion today.  No other localizing process identified.   Review of Systems: Review of Systems  Constitutional:  Negative for chills and fever.  HENT:  Negative for tinnitus.   Eyes:  Negative for blurred vision and photophobia.  Respiratory:  Negative for cough and sputum production.   Cardiovascular:  Negative for chest pain.  Gastrointestinal:  Negative for diarrhea, nausea and vomiting.  Genitourinary:  Negative for dysuria.  Musculoskeletal:   Positive for back pain. Negative for joint pain and neck pain.  Skin:  Negative for rash.  Neurological:  Negative for headaches.    Past Medical History:  Diagnosis Date   ADHD    Anxiety    Bipolar 1 disorder, manic, moderate (HCC)    Dissociative identity disorder (HCC)    IV drug user    OCD (obsessive compulsive disorder)    Pregnant    Scoliosis     Social History   Tobacco Use   Smoking status: Light Smoker    Current packs/day: 0.25    Types: Cigarettes    Passive exposure: Current   Smokeless tobacco: Never   Tobacco comments:    uses vape pen primarily now  Vaping Use   Vaping status: Some Days  Substance Use Topics   Alcohol use: Yes    Comment: not since found out was pregnant at 2 mths.   Drug use: Yes    Types: IV    Family History  Problem Relation Age of Onset   Hypertension Mother    Depression Mother    Hyperlipidemia Father    Diabetes Paternal Grandmother    Allergies  Allergen Reactions   Latex Swelling    OBJECTIVE: Blood pressure 94/63, pulse 75, temperature 97.6 F (36.4 C), temperature source Oral, resp. rate 18, height 5' (1.524 m), weight 45.9 kg, SpO2 100%.  Physical Exam Vitals reviewed.  Constitutional:      Appearance: She is ill-appearing.  HENT:     Mouth/Throat:     Mouth: No oral lesions.     Dentition: No dental abscesses.  Cardiovascular:     Rate and Rhythm: Normal rate and regular rhythm.     Comments: Getting lab drawn at the time of assessment.  Pulmonary:     Effort: Pulmonary effort is normal.     Breath sounds: Normal breath sounds.  Abdominal:     General: Bowel sounds are normal. There is no distension.     Palpations: Abdomen is soft.     Tenderness: There is no abdominal tenderness.  Musculoskeletal:        General: No tenderness. Normal range of motion.  Lymphadenopathy:     Cervical: No cervical adenopathy.  Skin:    General: Skin is warm and dry.     Findings: No rash.  Neurological:      Mental Status: She is alert and oriented to person, place, and time.  Psychiatric:        Judgment: Judgment normal.     Lab Results Lab Results  Component Value Date   WBC 5.5 01/10/2023   HGB 10.3 (L) 01/10/2023   HCT 33.6 (L) 01/10/2023   MCV 85.3 01/10/2023   PLT 426 (H) 01/10/2023    Lab Results  Component Value Date   CREATININE 0.61  01/08/2023   BUN 9 01/08/2023   NA 136 01/08/2023   K 3.6 01/08/2023   CL 99 01/08/2023   CO2 23 01/08/2023    Lab Results  Component Value Date   ALT 14 01/08/2023   AST 13 (L) 01/08/2023   ALKPHOS 66 01/08/2023   BILITOT 0.8 01/08/2023     Microbiology: Recent Results (from the past 240 hours)  Blood culture (routine x 2)     Status: None (Preliminary result)   Collection Time: 01/08/23  1:14 PM   Specimen: BLOOD RIGHT ARM  Result Value Ref Range Status   Specimen Description BLOOD RIGHT ARM  Final   Special Requests   Final    BOTTLES DRAWN AEROBIC AND ANAEROBIC Blood Culture results may not be optimal due to an inadequate volume of blood received in culture bottles   Culture   Final    NO GROWTH 2 DAYS Performed at [facility], [address] [place], [place] [address]    Report Status PENDING  Incomplete  Blood culture (routine x 2)     Status: None (Preliminary result)   Collection Time: 01/09/23  1:07 PM   Specimen: BLOOD RIGHT ARM  Result Value Ref Range Status   Specimen Description BLOOD RIGHT ARM  Final   Special Requests   Final    BOTTLES DRAWN AEROBIC ONLY Blood Culture results may not be optimal due to an inadequate volume of blood received in culture bottles   Culture   Final    NO GROWTH < 24 HOURS Performed at [facility], [address], [place], [place] [address]    Report Status PENDING  Incomplete    Rexene Alberts, MSN, NP-C [facility] Pager: [phone]  01/10/2023 1:42 PM
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
## 2023-01-10 NOTE — Progress Notes (Signed)
   01/10/23 1615  Vitals  BP 98/66  MAP (mmHg) 76  BP Location Right Arm  BP Method Automatic  Patient Position (if appropriate) Lying  Pulse Rate 99  Pulse Rate Source Dinamap  Resp 16  Level of Consciousness  Level of Consciousness Alert  MEWS COLOR  MEWS Score Color Green  Oxygen Therapy  SpO2 100 %  O2 Device Room Air  Pain Assessment  Pain Scale 0-10  Pain Score 5  MEWS Score  MEWS Temp 0  MEWS Systolic 1  MEWS Pulse 0  MEWS RR 0  MEWS LOC 0  MEWS Score 1

## 2023-01-10 NOTE — Progress Notes (Signed)
Informed Pt we will call her family about her fall. Pt requested not to contact her family.

## 2023-01-10 NOTE — Progress Notes (Signed)
   01/10/23 1500  Assess: MEWS Score  Temp (!) 97.1 F (36.2 C)  BP 100/69  MAP (mmHg) 79  Pulse Rate (!) 104  Resp 15  Level of Consciousness Alert  SpO2 98 %  O2 Device Room Air  Assess: MEWS Score  MEWS Temp 0  MEWS Systolic 1  MEWS Pulse 1  MEWS RR 0  MEWS LOC 0  MEWS Score 2  MEWS Score Color Yellow  Assess: if the MEWS score is Yellow or Red  Were vital signs accurate and taken at a resting state? Yes  Does the patient meet 2 or more of the SIRS criteria? No  MEWS guidelines implemented  Yes, yellow  Treat  MEWS Interventions Considered administering scheduled or prn medications/treatments as ordered  Take Vital Signs  Increase Vital Sign Frequency  Yellow: Q2hr x1, continue Q4hrs until patient remains green for 12hrs  Escalate  MEWS: Escalate Yellow: Discuss with charge nurse and consider notifying provider and/or RRT  Notify: Charge Nurse/RN  Name of Charge Nurse/RN Notified Miriam,RN  Provider Notification  Provider Name/Title Lenard Forth  Date Provider Notified 01/10/23  Time Provider Notified 1535  Method of Notification Page  Notification Reason Other (Comment) (Yellow MEWS)  Provider response No new orders  Date of Provider Response 01/10/23  Time of Provider Response 1535  Assess: SIRS CRITERIA  SIRS Temperature  0  SIRS Respirations  0  SIRS Pulse 1  SIRS WBC 0  SIRS Score Sum  1

## 2023-01-10 NOTE — Progress Notes (Signed)
   01/10/23 1354  SDOH Utilities Screening (Complete 1 screening row OR apply appropriate MACRO for Patient declined or Patient unable to answer)  In the past 12 months has the electric, gas, oil, or water company threatened to shut off services in your home? Yes  SDOH Interventions  Utilities Interventions Inpatient TOC    Spoke to patient at bedside. Patient lives with a friend and can return at discharge. States she has stable housing, food and utilities. Patient declined resources
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
## 2023-01-10 NOTE — Progress Notes (Signed)
   01/10/23 1356  OTHER  Substance Abuse Screening unable to be completed due to:  Patient Refused  Substance Abuse Education Offered Yes     Patient states she is not actively using any substance or alcohol and declined resources

## 2023-01-11 ENCOUNTER — Inpatient Hospital Stay (HOSPITAL_COMMUNITY): Payer: 59

## 2023-01-11 DIAGNOSIS — F1910 Other psychoactive substance abuse, uncomplicated: Secondary | ICD-10-CM | POA: Diagnosis not present

## 2023-01-11 DIAGNOSIS — D649 Anemia, unspecified: Secondary | ICD-10-CM | POA: Diagnosis not present

## 2023-01-11 DIAGNOSIS — Z86718 Personal history of other venous thrombosis and embolism: Secondary | ICD-10-CM | POA: Diagnosis not present

## 2023-01-11 DIAGNOSIS — M4646 Discitis, unspecified, lumbar region: Secondary | ICD-10-CM | POA: Diagnosis not present

## 2023-01-11 DIAGNOSIS — F39 Unspecified mood [affective] disorder: Secondary | ICD-10-CM | POA: Diagnosis not present

## 2023-01-11 HISTORY — PX: IR LUMBAR DISC ASPIRATION W/IMG GUIDE: IMG5306

## 2023-01-11 LAB — MRSA NEXT GEN BY PCR, NASAL: MRSA by PCR Next Gen: NOT DETECTED

## 2023-01-11 MED ORDER — MIDAZOLAM HCL 2 MG/2ML IJ SOLN
INTRAMUSCULAR | Status: AC
Start: 2023-01-11 — End: ?
  Filled 2023-01-11: qty 2

## 2023-01-11 MED ORDER — BUPIVACAINE HCL (PF) 0.5 % IJ SOLN
INTRAMUSCULAR | Status: AC
  Filled 2023-01-11: qty 30

## 2023-01-11 MED ORDER — FENTANYL CITRATE (PF) 100 MCG/2ML IJ SOLN
INTRAMUSCULAR | Status: AC
  Filled 2023-01-11: qty 2

## 2023-01-11 MED ORDER — ENSURE ENLIVE PO LIQD
237.0000 mL | Freq: Three times a day (TID) | ORAL | Status: DC
  Administered 2023-01-11 – 2023-01-28 (×38): 237 mL via ORAL

## 2023-01-11 MED ORDER — BUPIVACAINE HCL 0.5 % IJ SOLN
20.0000 mL | Freq: Once | INTRAMUSCULAR | Status: DC
  Filled 2023-01-11: qty 20

## 2023-01-11 MED ORDER — FENTANYL CITRATE (PF) 100 MCG/2ML IJ SOLN
INTRAMUSCULAR | Status: AC | PRN
  Administered 2023-01-11: 25 ug via INTRAVENOUS

## 2023-01-11 MED ORDER — ADULT MULTIVITAMIN W/MINERALS CH
1.0000 | ORAL_TABLET | Freq: Every day | ORAL | Status: DC
  Administered 2023-01-11: 1
  Filled 2023-01-11: qty 1

## 2023-01-11 MED ORDER — MIDAZOLAM HCL 2 MG/2ML IJ SOLN
INTRAMUSCULAR | Status: AC | PRN
  Administered 2023-01-11: 1 mg via INTRAVENOUS
  Administered 2023-01-11: .5 mg via INTRAVENOUS

## 2023-01-11 MED ORDER — ADULT MULTIVITAMIN W/MINERALS CH
1.0000 | ORAL_TABLET | Freq: Every day | ORAL | Status: DC
  Administered 2023-01-12 – 2023-01-28 (×17): 1 via ORAL
  Filled 2023-01-11 (×17): qty 1

## 2023-01-11 NOTE — Procedures (Signed)
INR.  Status post attempted L5-S1 disc aspiration fluoroscopic guidance.  Procedure abandoned  due to poor visualization of the  L5-S1 disc space due to overlying distended colon, and patient movement.  Fatima Sanger MD

## 2023-01-11 NOTE — Progress Notes (Signed)
OT Cancellation Note  [name] MRN: [id number] DOB: November 25, 1985   Cancelled Treatment:    Reason Eval/Treat Not Completed: Patient declined, no reason specified  Mateo Flow 01/11/2023, 3:37 PM

## 2023-01-11 NOTE — Progress Notes (Signed)
Unable to do LP today   Remains afebrile    -anticipate repeat attempt for vertebral/disc biopsy for culture -remain off abx -discussed with primary team and ir; unclear timing at this time

## 2023-01-11 NOTE — Progress Notes (Signed)
Nicotine and lidocaine patch was removed by patient.

## 2023-01-11 NOTE — Progress Notes (Signed)
Initial Nutrition Assessment  DOCUMENTATION CODES:   Not applicable  INTERVENTION:   - Ensure Enlive po TID, each supplement provides 350 kcal and 20 grams of protein, pt prefers chocolate flavor  - MVI with minerals daily  NUTRITION DIAGNOSIS:   Increased nutrient needs related to acute illness as evidenced by estimated needs.  GOAL:   Patient will meet greater than or equal to 90% of their needs  MONITOR:   PO intake, Supplement acceptance, Weight trends  REASON FOR ASSESSMENT:   Malnutrition Screening Tool, Consult Assessment of nutrition requirement/status  ASSESSMENT:   37 year old female who presented to the ED on 12/21 with lower back pain. 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 Pt admitted with discitis, osteomyelitis, epidural phlegmon.  12/24 - s/p attempted L5-S1 disc aspiration that was aborted due to poor visualization and pt movement  Spoke with pt briefly at bedside. Pt laying in bed with face half covered with blanket and eyes shut. Pt did not open eyes during RD interview.  Pt requesting RD to assist with meal ordering. Pt would like grits, scrambled eggs with cheese, cinnamon raisin bagel with cream cheese and jelly, bacon, chocolate milk, and hot tea with sugar. RD placed meal order in [employer] diet software.  Pt states that she has a good appetite and reports having a good appetite PTA. She does not know how many meals she typically consumes. She does not know what she usually weighs and if she has experienced any recent weight changes. Weight is up 1.2 kg in chart compared to weight from 07/17/22. Pt is willing to drink Ensure if provided in chocolate flavor. Pt difficult to engage and requested RD interview be terminated due to  desire to sleep.  Pt with a history of malnutrition. RD attempted to complete NFPE to reassess for malnutrition but pt refused.  Will order Ensure Enlive TID between meals in chocolate flavor. Will also order daily MVI with minerals.  Admit weight: 48 kg Current weight: 51.1 kg  Meal Completion: 25-100% x 4 documented meals  Medications reviewed and include: Ensure Enlive BID  Labs reviewed.  NUTRITION - FOCUSED PHYSICAL EXAM:  Pt refused due to pain and wanting to sleep. Will re-attempt at follow-up.  Diet Order:   Diet Order             Diet regular Room service appropriate? Yes; Fluid consistency: Thin  Diet effective now                   EDUCATION NEEDS:   Not appropriate for education at this time  Skin:  Skin Assessment: Reviewed RN Assessment  Last BM:  01/09/23  Height:   Ht Readings from Last 1 Encounters:  01/09/23 5' (1.524 m)    Weight:   Wt Readings from Last 1 Encounters:  01/11/23 51.1 kg    BMI:  Body mass index is 22 kg/m.  Estimated Nutritional Needs:   Kcal:  1800-2000  Protein:  80-95 grams  Fluid:  >1.8 L    Mertie Clause, MS, RD, LDN Registered Dietitian II Please see AMiON for contact information.
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
## 2023-01-12 DIAGNOSIS — M4646 Discitis, unspecified, lumbar region: Secondary | ICD-10-CM | POA: Diagnosis not present

## 2023-01-12 DIAGNOSIS — F1910 Other psychoactive substance abuse, uncomplicated: Secondary | ICD-10-CM | POA: Diagnosis not present

## 2023-01-12 DIAGNOSIS — F39 Unspecified mood [affective] disorder: Secondary | ICD-10-CM | POA: Diagnosis not present

## 2023-01-12 DIAGNOSIS — D649 Anemia, unspecified: Secondary | ICD-10-CM | POA: Diagnosis not present

## 2023-01-12 DIAGNOSIS — Z86718 Personal history of other venous thrombosis and embolism: Secondary | ICD-10-CM | POA: Diagnosis not present

## 2023-01-12 MED ORDER — METHADONE HCL 10 MG PO TABS
10.0000 mg | ORAL_TABLET | Freq: Every day | ORAL | Status: DC
  Administered 2023-01-12 – 2023-01-16 (×5): 10 mg via ORAL
  Filled 2023-01-12 (×5): qty 1

## 2023-01-12 MED ORDER — LORAZEPAM 2 MG/ML IJ SOLN
1.0000 mg | Freq: Four times a day (QID) | INTRAMUSCULAR | Status: DC | PRN
  Administered 2023-01-12 – 2023-01-14 (×6): 1 mg via INTRAVENOUS
  Filled 2023-01-12 (×6): qty 1
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
## 2023-01-12 NOTE — Plan of Care (Signed)
  Problem: Education: Goal: Knowledge of General Education information will improve Description: Including pain rating scale, medication(s)/side effects and non-pharmacologic comfort measures Outcome: Progressing   Problem: Activity: Goal: Risk for activity intolerance will decrease Outcome: Progressing   Problem: Nutrition: Goal: Adequate nutrition will be maintained Outcome: Progressing   Problem: Coping: Goal: Level of anxiety will decrease Outcome: Progressing   Problem: Elimination: Goal: Will not experience complications related to bowel motility Outcome: Progressing   Problem: Pain Management: Goal: General experience of comfort will improve Outcome: Progressing   Problem: Safety: Goal: Ability to remain free from injury will improve Outcome: Progressing   Problem: Skin Integrity: Goal: Risk for impaired skin integrity will decrease Outcome: Progressing

## 2023-01-13 DIAGNOSIS — F39 Unspecified mood [affective] disorder: Secondary | ICD-10-CM | POA: Diagnosis not present

## 2023-01-13 DIAGNOSIS — Z86718 Personal history of other venous thrombosis and embolism: Secondary | ICD-10-CM | POA: Diagnosis not present

## 2023-01-13 DIAGNOSIS — F1910 Other psychoactive substance abuse, uncomplicated: Secondary | ICD-10-CM | POA: Diagnosis not present

## 2023-01-13 DIAGNOSIS — M4646 Discitis, unspecified, lumbar region: Secondary | ICD-10-CM | POA: Diagnosis not present

## 2023-01-13 DIAGNOSIS — D649 Anemia, unspecified: Secondary | ICD-10-CM | POA: Diagnosis not present

## 2023-01-13 LAB — CBC WITH DIFFERENTIAL/PLATELET
Abs Immature Granulocytes: 0.02 10*3/uL (ref 0.00–0.07)
Basophils Absolute: 0 10*3/uL (ref 0.0–0.1)
Basophils Relative: 1 %
Eosinophils Absolute: 0.1 10*3/uL (ref 0.0–0.5)
Eosinophils Relative: 2 %
HCT: 31.9 % — ABNORMAL LOW (ref 36.0–46.0)
Hemoglobin: 10 g/dL — ABNORMAL LOW (ref 12.0–15.0)
Immature Granulocytes: 0 %
Lymphocytes Relative: 30 %
Lymphs Abs: 1.7 10*3/uL (ref 0.7–4.0)
MCH: 26.2 pg (ref 26.0–34.0)
MCHC: 31.3 g/dL (ref 30.0–36.0)
MCV: 83.5 fL (ref 80.0–100.0)
Monocytes Absolute: 0.3 10*3/uL (ref 0.1–1.0)
Monocytes Relative: 6 %
Neutro Abs: 3.6 10*3/uL (ref 1.7–7.7)
Neutrophils Relative %: 61 %
Platelets: 443 10*3/uL — ABNORMAL HIGH (ref 150–400)
RBC: 3.82 MIL/uL — ABNORMAL LOW (ref 3.87–5.11)
RDW: 13.5 % (ref 11.5–15.5)
WBC: 5.9 10*3/uL (ref 4.0–10.5)
nRBC: 0 % (ref 0.0–0.2)

## 2023-01-13 LAB — COMPREHENSIVE METABOLIC PANEL
ALT: 16 U/L (ref 0–44)
AST: 19 U/L (ref 15–41)
Albumin: 2.2 g/dL — ABNORMAL LOW (ref 3.5–5.0)
Alkaline Phosphatase: 86 U/L (ref 38–126)
Anion gap: 9 (ref 5–15)
BUN: 13 mg/dL (ref 6–20)
CO2: 27 mmol/L (ref 22–32)
Calcium: 8.8 mg/dL — ABNORMAL LOW (ref 8.9–10.3)
Chloride: 101 mmol/L (ref 98–111)
Creatinine, Ser: 0.68 mg/dL (ref 0.44–1.00)
GFR, Estimated: >60 mL/min (ref >60)
Glucose, Bld: 87 mg/dL (ref 70–99)
Potassium: 4.2 mmol/L (ref 3.5–5.1)
Sodium: 137 mmol/L (ref 135–145)
Total Bilirubin: 0.3 mg/dL (ref <1.2)
Total Protein: 6.3 g/dL — ABNORMAL LOW (ref 6.5–8.1)

## 2023-01-13 LAB — CULTURE, BLOOD (ROUTINE X 2): Culture: NO GROWTH

## 2023-01-13 LAB — MAGNESIUM: Magnesium: 1.9 mg/dL (ref 1.7–2.4)

## 2023-01-13 MED ORDER — OXYCODONE-ACETAMINOPHEN 5-325 MG PO TABS: 1.0000 | ORAL_TABLET | Freq: Four times a day (QID) | ORAL | Status: DC | PRN

## 2023-01-13 MED ORDER — OXYCODONE-ACETAMINOPHEN 5-325 MG PO TABS
1.0000 | ORAL_TABLET | Freq: Four times a day (QID) | ORAL | Status: DC | PRN
  Administered 2023-01-13 – 2023-01-14 (×4): 1 via ORAL
  Filled 2023-01-13 (×4): qty 1

## 2023-01-13 NOTE — Plan of Care (Signed)
IR was requested for repeat L5-S1 disc aspiration.   Disc aspiration was attempted with moderate sedation on 12/24, unsuccessful due to poor visualization on the AP projection and patient's persistent motion.   Discussed with Dr. Corliss Skains, repeat disc aspiration can be performed with deep sedation.  Contacted anesthesia team, they are available Friday AM.   The procedure is tentatively scheduled for 8:30 tomorrow.  New consent obtained from the patient, in chart.   PLAN - NPO except meds at [place] - Lovenox held  - Patient will be seen for same day H&P in AM.   Please call IR for questions and concerns.   Summer Bologna Tykerria Mccubbins PA-C 01/13/2023 2:35 PM
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
## 2023-01-14 ENCOUNTER — Encounter (HOSPITAL_COMMUNITY): Payer: Self-pay | Admitting: Internal Medicine

## 2023-01-14 ENCOUNTER — Inpatient Hospital Stay (HOSPITAL_COMMUNITY): Payer: 59 | Admitting: Certified Registered Nurse Anesthetist

## 2023-01-14 ENCOUNTER — Inpatient Hospital Stay (HOSPITAL_COMMUNITY): Payer: 59

## 2023-01-14 ENCOUNTER — Encounter (HOSPITAL_COMMUNITY)
Admission: EM | Disposition: A | Discharge status: Home / Self Care | Payer: Self-pay | Source: Home / Self Care | Attending: Internal Medicine

## 2023-01-14 DIAGNOSIS — M4647 Discitis, unspecified, lumbosacral region: Secondary | ICD-10-CM

## 2023-01-14 DIAGNOSIS — D649 Anemia, unspecified: Secondary | ICD-10-CM | POA: Diagnosis not present

## 2023-01-14 DIAGNOSIS — F39 Unspecified mood [affective] disorder: Secondary | ICD-10-CM | POA: Diagnosis not present

## 2023-01-14 DIAGNOSIS — Z86718 Personal history of other venous thrombosis and embolism: Secondary | ICD-10-CM | POA: Diagnosis not present

## 2023-01-14 DIAGNOSIS — F1910 Other psychoactive substance abuse, uncomplicated: Secondary | ICD-10-CM | POA: Diagnosis not present

## 2023-01-14 DIAGNOSIS — M4646 Discitis, unspecified, lumbar region: Secondary | ICD-10-CM | POA: Diagnosis not present

## 2023-01-14 HISTORY — PX: IR LUMBAR DISC ASPIRATION W/IMG GUIDE: IMG5306

## 2023-01-14 HISTORY — PX: RADIOLOGY WITH ANESTHESIA: SHX6223

## 2023-01-14 LAB — CULTURE, BLOOD (ROUTINE X 2): Culture: NO GROWTH

## 2023-01-14 SURGERY — IR WITH ANESTHESIA
Anesthesia: General

## 2023-01-14 MED ORDER — LIDOCAINE 2% (20 MG/ML) 5 ML SYRINGE
INTRAMUSCULAR | Status: DC | PRN
  Administered 2023-01-14: 100 mg via INTRAVENOUS

## 2023-01-14 MED ORDER — ORAL CARE MOUTH RINSE: 15.0000 mL | Freq: Once | OROMUCOSAL | Status: AC

## 2023-01-14 MED ORDER — LORAZEPAM 2 MG/ML IJ SOLN
1.0000 mg | Freq: Four times a day (QID) | INTRAMUSCULAR | Status: DC | PRN
  Administered 2023-01-14 – 2023-01-16 (×5): 1 mg via INTRAVENOUS
  Filled 2023-01-14 (×7): qty 1

## 2023-01-14 MED ORDER — FENTANYL CITRATE (PF) 100 MCG/2ML IJ SOLN: 25.0000 ug | INTRAMUSCULAR | Status: DC | PRN

## 2023-01-14 MED ORDER — ROCURONIUM BROMIDE 10 MG/ML (PF) SYRINGE
PREFILLED_SYRINGE | INTRAVENOUS | Status: DC | PRN
  Administered 2023-01-14: 40 mg via INTRAVENOUS

## 2023-01-14 MED ORDER — CHLORHEXIDINE GLUCONATE 0.12 % MT SOLN
15.0000 mL | Freq: Once | OROMUCOSAL | Status: AC
  Administered 2023-01-14: 15 mL via OROMUCOSAL
  Filled 2023-01-14: qty 15

## 2023-01-14 MED ORDER — BUPIVACAINE HCL 0.25 % IJ SOLN
20.0000 mL | Freq: Once | INTRAMUSCULAR | Status: AC
  Administered 2023-01-14: 30 mL
  Filled 2023-01-14: qty 20

## 2023-01-14 MED ORDER — LACTATED RINGERS IV SOLN: INTRAVENOUS | Status: DC

## 2023-01-14 MED ORDER — PENICILLIN G BENZATHINE 1200000 UNIT/2ML IM SUSY
2.4000 10*6.[IU] | PREFILLED_SYRINGE | Freq: Once | INTRAMUSCULAR | Status: AC
  Administered 2023-01-15: 2.4 10*6.[IU] via INTRAMUSCULAR
  Filled 2023-01-14: qty 4

## 2023-01-14 MED ORDER — BISACODYL 5 MG PO TBEC: 5.0000 mg | DELAYED_RELEASE_TABLET | Freq: Every day | ORAL | Status: DC | PRN

## 2023-01-14 MED ORDER — ONDANSETRON HCL 4 MG/2ML IJ SOLN
INTRAMUSCULAR | Status: DC | PRN
  Administered 2023-01-14: 4 mg via INTRAVENOUS

## 2023-01-14 MED ORDER — PROPOFOL 10 MG/ML IV BOLUS
INTRAVENOUS | Status: DC | PRN
  Administered 2023-01-14: 100 mg via INTRAVENOUS

## 2023-01-14 MED ORDER — OXYCODONE-ACETAMINOPHEN 5-325 MG PO TABS
1.0000 | ORAL_TABLET | Freq: Four times a day (QID) | ORAL | Status: DC | PRN
  Administered 2023-01-14 – 2023-01-25 (×33): 1 via ORAL
  Filled 2023-01-14 (×35): qty 1

## 2023-01-14 MED ORDER — BUPIVACAINE HCL (PF) 0.25 % IJ SOLN
INTRAMUSCULAR | Status: AC
  Filled 2023-01-14: qty 30

## 2023-01-14 MED ORDER — DAPTOMYCIN-SODIUM CHLORIDE 500-0.9 MG/50ML-% IV SOLN
8.0000 mg/kg | Freq: Every day | INTRAVENOUS | Status: DC
Start: 2023-01-14 — End: 2023-01-15
  Filled 2023-01-14 (×2): qty 50

## 2023-01-14 MED ORDER — POLYETHYLENE GLYCOL 3350 17 G PO PACK: 17.0000 g | PACK | Freq: Every day | ORAL | Status: DC | PRN

## 2023-01-14 MED ORDER — SODIUM CHLORIDE 0.9 % IV SOLN
2.0000 g | Freq: Three times a day (TID) | INTRAVENOUS | Status: DC
Start: 2023-01-14 — End: 2023-01-16
  Administered 2023-01-14 – 2023-01-16 (×6): 2 g via INTRAVENOUS
  Filled 2023-01-14 (×6): qty 12.5

## 2023-01-14 MED ORDER — SUGAMMADEX SODIUM 200 MG/2ML IV SOLN
INTRAVENOUS | Status: DC | PRN
  Administered 2023-01-14: 200 mg via INTRAVENOUS

## 2023-01-14 MED ORDER — FENTANYL CITRATE (PF) 100 MCG/2ML IJ SOLN
INTRAMUSCULAR | Status: DC | PRN
  Administered 2023-01-14: 50 ug via INTRAVENOUS

## 2023-01-14 MED ORDER — HYDROXYZINE HCL 25 MG PO TABS
50.0000 mg | ORAL_TABLET | Freq: Three times a day (TID) | ORAL | Status: DC | PRN
  Administered 2023-01-14 – 2023-01-27 (×13): 50 mg via ORAL
  Filled 2023-01-14 (×14): qty 2

## 2023-01-14 NOTE — Anesthesia Preprocedure Evaluation (Addendum)
Anesthesia Evaluation  Patient identified by MRN, date of birth, ID band Patient awake    Reviewed: Allergy & Precautions, H&P , NPO status , Patient's Chart, lab work & pertinent test results  Airway Mallampati: II  TM Distance: >3 FB Neck ROM: Full    Dental no notable dental hx.    Pulmonary Current Smoker and Patient abstained from smoking.   Pulmonary exam normal breath sounds clear to auscultation       Cardiovascular negative cardio ROS Normal cardiovascular exam Rhythm:Regular Rate:Normal     Neuro/Psych     Bipolar Disorder   negative neurological ROS     GI/Hepatic negative GI ROS,,,(+)     substance abuse  cocaine use and methamphetamine use, Hepatitis -, C  Endo/Other  negative endocrine ROS    Renal/GU negative Renal ROS  negative genitourinary   Musculoskeletal negative musculoskeletal ROS (+)    Abdominal   Peds negative pediatric ROS (+)  Hematology  (+) Blood dyscrasia, anemia   Anesthesia Other Findings   Reproductive/Obstetrics negative OB ROS                             Anesthesia Physical Anesthesia Plan  ASA: 3  Anesthesia Plan: General   Post-op Pain Management: Minimal or no pain anticipated and Tylenol PO (pre-op)*   Induction: Intravenous  PONV Risk Score and Plan: 2 and Ondansetron, Dexamethasone and Treatment may vary due to age or medical condition  Airway Management Planned: Oral ETT  Additional Equipment:   Intra-op Plan:   Post-operative Plan: Extubation in OR  Informed Consent: I have reviewed the patients History and Physical, chart, labs and discussed the procedure including the risks, benefits and alternatives for the proposed anesthesia with the patient or authorized representative who has indicated his/her understanding and acceptance.     Dental advisory given  Plan Discussed with: CRNA and Surgeon  Anesthesia Plan Comments:         Anesthesia Quick Evaluation

## 2023-01-14 NOTE — Progress Notes (Signed)
   01/14/23 1430  Assess: MEWS Score  Temp 98.2 F (36.8 C)  BP 97/73  MAP (mmHg) 82  Pulse Rate (!) 102  Resp 16  SpO2 100 %  O2 Device Room Air  Assess: MEWS Score  MEWS Temp 0  MEWS Systolic 1  MEWS Pulse 1  MEWS RR 0  MEWS LOC 1  MEWS Score 3  MEWS Score Color Yellow  Assess: if the MEWS score is Yellow or Red  Were vital signs accurate and taken at a resting state? Yes  Does the patient meet 2 or more of the SIRS criteria? No  MEWS guidelines implemented  Yes, yellow  Treat  MEWS Interventions Considered administering scheduled or prn medications/treatments as ordered  Take Vital Signs  Increase Vital Sign Frequency  Yellow: Q2hr x1, continue Q4hrs until patient remains green for 12hrs  Escalate  MEWS: Escalate Yellow: Discuss with charge nurse and consider notifying provider and/or RRT  Provider Notification  Provider Name/Title Blake Divine, MD  Date Provider Notified 01/14/23  Time Provider Notified 1452  Method of Notification Page  Notification Reason Other (Comment) (yellow MEWS)  Assess: SIRS CRITERIA  SIRS Temperature  0  SIRS Respirations  0  SIRS Pulse 1  SIRS WBC 0  SIRS Score Sum  1

## 2023-01-14 NOTE — Procedures (Signed)
INR.  Status post L5-S1 disc aspiration using fluoroscopic guidance.    Right posterolateral approach.  Approximately 2 cc of bloodstained thick aspirate was obtained with 2 passes using a 21-gauge Franseen core biopsy needle.  Patient tolerated the  procedure well.  Patient  was extubated.  Following simple commands appropriately.  Moves all 4 hours against gravity symmetrically.  S.Ervey Fallin MD

## 2023-01-14 NOTE — H&P (Signed)
Chief Complaint: Discitis  Referring Provider(s):  Dr. Kathlen Mody, MD   Supervising Physician: Julieanne Cotton  Patient Status: [facility] - Out-pt  History of Present Illness: Summer Cain is a 37 y.o. 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   She presented with severe back pain.   MRI of the lumbar and thoracic spine noted discitis/osteomyelitis of L5 / S1 with intervertebral purulence decompressing ventrally through the disc posteriorly. There is a right paracentral protrusion and ventral epidural phlegmon impinging on the right S1 nerve root and a chronic L5 pars defect.   Dr. Conchita Paris of neurosurgery consulted who is recommending medical therapy only with spectrum antibiotics as no cord compression appreciated.     Interventional Radiology was requested for L5 / S1 disc aspiration. Request was reviewed and approved by Dr. Corliss Skains.  He attempted the procedure yesterday however was not able to complete it due to patient movement / uncooperative.  Plan is to re-attempt today with general anesthesia.  Patient is Full Code  Past Medical History:  Diagnosis Date   ADHD    Anxiety    Bipolar 1 disorder, manic, moderate (HCC)    Dissociative identity disorder (HCC)    IV drug user    OCD (obsessive compulsive disorder)    Pregnant    Scoliosis     Past Surgical History:  Procedure Laterality Date   IR LUMBAR DISC ASPIRATION W/IMG GUIDE  01/11/2023   IRRIGATION AND DEBRIDEMENT ELBOW Right 09/17/2019   Procedure: IRRIGATION AND DEBRIDEMENT RIGHT  ELBOW AND RIGHT WRIST;  Surgeon: Sheral Apley, MD;  Location: [facility] OR;  Service: Orthopedics;  Laterality: Right;   KIDNEY STONE SURGERY      RADIOLOGY WITH ANESTHESIA N/A 12/10/2020   Procedure: MRI WITH ANESTHESIA- THORACIC WITH AND WITHOUT CONTRAST CERVICAL WITH AND WITHOUT CONTRAST;  Surgeon: Radiologist, Medication, MD;  Location: [facility] OR;  Service: Radiology;  Laterality: N/A;   TEE WITHOUT CARDIOVERSION N/A 12/10/2020   Procedure: TRANSESOPHAGEAL ECHOCARDIOGRAM (TEE);  Surgeon: [name], MD;  Location: [facility] ENDOSCOPY;  Service: Cardiovascular;  Laterality: N/A;    Allergies: Latex  Medications: Prior to Admission medications   Medication Sig Start Date End Date Taking? Authorizing Provider  cabotegravir ER (APRETUDE) 600 MG/3ML injection Inject 3 mLs (600 mg total) into the muscle every 30 (thirty) days. Patient not taking: Reported on 01/09/2023 12/22/20   Kuppelweiser, Cassie L, RPH-CPP  cabotegravir ER (APRETUDE) 600 MG/3ML injection Inject 3 mLs (600 mg total) into the muscle every 2 (two) months. Patient not taking: Reported on 01/09/2023 12/22/20   Kuppelweiser, Cassie L, RPH-CPP  QUEtiapine (SEROQUEL) 200 MG tablet Take 200 mg by mouth at bedtime. Patient not taking: Reported on 01/09/2023    [provider]  traZODone (DESYREL) 100 MG tablet Take 50-100 mg by mouth See admin instructions. 100mg  at bedtime, may take an additional 50mg  as needed for sleep Patient not taking: Reported on 01/09/2023    [provider]     Family History  Problem Relation Age of Onset   Hypertension Mother    Depression Mother    Hyperlipidemia Father    Diabetes Paternal Grandmother     Social History   Socioeconomic History   Marital status: Single    Spouse name: Not on file  Number of children: Not on file   Years of education: Not on file   Highest education level: Not on file  Occupational History   Not on file  Tobacco Use   Smoking status: Light Smoker    Current packs/day: 0.25    Types: Cigarettes    Passive exposure: Current   Smokeless tobacco: Never   Tobacco comments:    uses  vape pen primarily now  Vaping Use   Vaping status: Some Days  Substance and Sexual Activity   Alcohol use: Yes    Comment: not since found out was pregnant at 2 mths.   Drug use: Yes    Types: IV   Sexual activity: Yes    Birth control/protection: None  Other Topics Concern   Not on file  Social History Narrative   Not on file   Social Drivers of Health   Financial Resource Strain: Not on file  Food Insecurity: No Food Insecurity (01/10/2023)   Hunger Vital Sign    Worried About Running Out of Food in the Last Year: Never true    Ran Out of Food in the Last Year: Never true  Recent Concern: Food Insecurity - Food Insecurity Present (01/09/2023)   Hunger Vital Sign    Worried About Running Out of Food in the Last Year: Sometimes true    Ran Out of Food in the Last Year: Sometimes true  Transportation Needs: No Transportation Needs (01/09/2023)   PRAPARE - Administrator, Civil Service (Medical): No    Lack of Transportation (Non-Medical): No  Physical Activity: Not on file  Stress: Not on file  Social Connections: Unknown (06/02/2021)   Received from [facility]   Social Network    Social Network: Not on file     Review of Systems: A 12 point ROS discussed and pertinent positives are indicated in the HPI above.  All other systems are negative.  Review of Systems  Vital Signs: BP 95/67   Pulse 84   Temp 97.7 F (36.5 C)   Resp 16   Ht 5' (1.524 m)   Wt 127 lb (57.6 kg)   LMP  (LMP Unknown)   SpO2 99%   BMI 24.80 kg/m   Advance Care Plan: The advanced care place/surrogate decision maker was discussed at the time of visit and the patient did not wish to discuss or was not able to name a surrogate decision maker or provide an advance care plan.  Physical Exam Vitals reviewed.  Constitutional:      Appearance: Normal appearance.  HENT:     Head: Normocephalic and atraumatic.  Eyes:     Extraocular Movements: Extraocular movements  intact.  Cardiovascular:     Rate and Rhythm: Normal rate and regular rhythm.  Pulmonary:     Effort: Pulmonary effort is normal. No respiratory distress.     Breath sounds: Normal breath sounds.  Abdominal:     Palpations: Abdomen is soft.  Musculoskeletal:        General: Normal range of motion.     Cervical back: Normal range of motion.  Skin:    General: Skin is warm and dry.  Neurological:     General: No focal deficit present.     Mental Status: She is alert and oriented to person, place, and time.  Psychiatric:        Mood and Affect: Mood normal.        Behavior: Behavior normal.  Thought Content: Thought content normal.        Judgment: Judgment normal.     Imaging:   Labs:  CBC: Recent Labs    03/27/22 1705 03/27/22 1743 01/08/23 1253 01/10/23 1134 01/13/23 0617  WBC 8.2  --  9.5 5.5 5.9  HGB 13.7 14.3 10.5* 10.3* 10.0*  HCT 40.0 42.0 33.4* 33.6* 31.9*  PLT 289  --  580* 426* 443*    COAGS: No results for input(s): "INR", "APTT" in the last 8760 hours.  BMP: Recent Labs    03/27/22 1705 03/27/22 1743 01/08/23 1253 01/13/23 0617  NA 132* 131* 136 137  K 3.1* 3.1* 3.6 4.2  CL 94*  --  99 101  CO2 21*  --  23 27  GLUCOSE 96  --  92 87  BUN 12  --  9 13  CALCIUM 8.7*  --  9.2 8.8*  CREATININE 0.80  --  0.61 0.68  GFRNONAA >60  --  >60 >60    LIVER FUNCTION TESTS: Recent Labs    03/27/22 1705 01/08/23 1253 01/13/23 0617  BILITOT 1.7* 0.8 0.3  AST 47* 13* 19  ALT 33 14 16  ALKPHOS 60 66 86  PROT 6.9 7.9 6.3*  ALBUMIN 4.0 2.8* 2.2*    TUMOR MARKERS: No results for input(s): "AFPTM", "CEA", "CA199", "CHROMGRNA" in the last 8760 hours.  Assessment and Plan:  Lumbar discitis - attempt at disc aspiration failed yesterday, will re-attempt today with anesthesia.  Patient exam is unchanged from previous evaluation and ready for procedure.  Risks and benefits of lumbar disc aspiration was discussed with the patient and/or  patient's family including, but not limited to bleeding, infection, damage to adjacent structures or low yield requiring additional tests.  All of the questions were answered and there is agreement to proceed.  Consent signed and in chart.  Thank you for allowing our service to participate in Adalynne Fekete 's care.  Electronically Signed: Gwynneth Macleod, PA-C   01/14/2023, 8:27 AM      I spent a total of    15 Minutes in face to face in clinical consultation, greater than 50% of which was counseling/coordinating care for disc aspiration.

## 2023-01-14 NOTE — Transfer of Care (Signed)
Immediate Anesthesia Transfer of Care Note  Patient: Summer Cain  Procedure(s) Performed: IR WITH ANESTHESIA  Patient Location: PACU  Anesthesia Type:General  Level of Consciousness: awake, alert , and oriented  Airway & Oxygen Therapy: Patient Spontanous Breathing and Patient connected to nasal cannula oxygen  Post-op Assessment: Report given to RN and Post -op Vital signs reviewed and stable  Post vital signs: Reviewed and stable  Last Vitals:  Vitals Value Taken Time  BP 124/91 01/14/23 0955  Temp    Pulse    Resp 15 01/14/23 0959  SpO2    Vitals shown include unfiled device data.  Last Pain:  Vitals:   01/14/23 0807  TempSrc:   PainSc: 8       Patients Stated Pain Goal: 2 (01/14/23 0807)  Complications: No notable events documented.

## 2023-01-14 NOTE — Progress Notes (Signed)
PT Cancellation Note  Patient Details Name: Silveria Lako MRN: [id number] DOB: 1986-01-13   Cancelled Treatment:    Reason Eval/Treat Not Completed: (P) Patient at procedure or test/unavailable (pt off unit -OR) Will continue efforts per PT plan of care as schedule permits.   Dorathy Kinsman Dorathea Faerber 01/14/2023, 10:11 AM

## 2023-01-14 NOTE — Progress Notes (Signed)
Pharmacy Antibiotic Note  Summer Cain is a 37 y.o. female admitted on 01/08/2023 with  discitis .  Pharmacy has been consulted for daptomycin and cefepime dosing.  Plan: Daptomycin 8 mg/kg (500 mg) iv qday Cefepime 2 grams iv q8h CK weekly (Saturdays) while on Dapto Monitor RF and adjust doses prn Monitor micros for opportunities to de-escalate ID consulted and following  Height: 5' (152.4 cm) Weight: 57.6 kg (127 lb) IBW/kg (Calculated) : 45.5  Temp (24hrs), Avg:97.8 F (36.6 C), Min:97.5 F (36.4 C), Max:98.3 F (36.8 C)  Recent Labs  Lab 01/08/23 1253 01/10/23 1134 01/13/23 0617  WBC 9.5 5.5 5.9  CREATININE 0.61  --  0.68    Estimated Creatinine Clearance: 77.9 mL/min (by C-G formula based on SCr of 0.68 mg/dL).    Allergies  Allergen Reactions   Latex Swelling    Antimicrobials this admission: dapto 12/27 >>  Cefepime 12/22 >> 12/23 / restart 12/27 >>  Vanco 12/22 >> 12/23    Microbiology results: 12/22 BCx: ngtdF 12/24 surgical intervertebral disc- ip 12/27 vertebra- ip 12/22 MRSA PCR: neg  Thank you for allowing pharmacy to be a part of this patient's care.   Greta Doom BS, PharmD, BCPS Clinical Pharmacist 01/14/2023 1:50 PM  Contact: [phone] after 3 PM  "Be curious, not judgmental..." -Debbora Dus

## 2023-01-14 NOTE — Anesthesia Postprocedure Evaluation (Signed)
Anesthesia Post Note  Patient: Geophysical data processor  Procedure(s) Performed: IR WITH ANESTHESIA     Patient location during evaluation: PACU Anesthesia Type: General Level of consciousness: awake and alert Pain management: pain level controlled Vital Signs Assessment: post-procedure vital signs reviewed and stable Respiratory status: spontaneous breathing, nonlabored ventilation, respiratory function stable and patient connected to nasal cannula oxygen Cardiovascular status: blood pressure returned to baseline and stable Postop Assessment: no apparent nausea or vomiting Anesthetic complications: no  No notable events documented.  Last Vitals:  Vitals:   01/14/23 1015 01/14/23 1030  BP: 124/78 123/89  Pulse: 91 92  Resp: 14 16  Temp: 36.5 C 36.4 C  SpO2: 100% 100%    Last Pain:  Vitals:   01/14/23 1030  TempSrc: Oral  PainSc:                  Elber Galyean S

## 2023-01-14 NOTE — Anesthesia Procedure Notes (Signed)
Procedure Name: Intubation Date/Time: 01/14/2023 8:47 AM  Performed by: Samara Deist, CRNAPre-anesthesia Checklist: Patient identified, Emergency Drugs available, Suction available and Patient being monitored Patient Re-evaluated:Patient Re-evaluated prior to induction Oxygen Delivery Method: Circle System Utilized Preoxygenation: Pre-oxygenation with 100% oxygen Induction Type: IV induction Ventilation: Mask ventilation without difficulty Laryngoscope Size: Mac and 4 Grade View: Grade I Tube type: Oral Tube size: 7.0 mm Number of attempts: 1 Airway Equipment and Method: Stylet Placement Confirmation: ETT inserted through vocal cords under direct vision, positive ETCO2 and breath sounds checked- equal and bilateral Secured at: 20 cm Tube secured with: Tape Dental Injury: Teeth and Oropharynx as per pre-operative assessment
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
## 2023-01-14 NOTE — Progress Notes (Signed)
OT Cancellation Note  [name] MRN: [id number] DOB: 10/31/1985   Cancelled Treatment:    Reason Eval/Treat Not Completed: Patient at procedure or test/ unavailable  Suzanna Obey 01/14/2023, 7:59 AM 01/14/2023  RP, OTR/L  Acute Rehabilitation Services  Office:  [phone]

## 2023-01-15 DIAGNOSIS — Z86718 Personal history of other venous thrombosis and embolism: Secondary | ICD-10-CM | POA: Diagnosis not present

## 2023-01-15 DIAGNOSIS — M4646 Discitis, unspecified, lumbar region: Secondary | ICD-10-CM | POA: Diagnosis not present

## 2023-01-15 DIAGNOSIS — F1910 Other psychoactive substance abuse, uncomplicated: Secondary | ICD-10-CM | POA: Diagnosis not present

## 2023-01-15 DIAGNOSIS — F39 Unspecified mood [affective] disorder: Secondary | ICD-10-CM | POA: Diagnosis not present

## 2023-01-15 DIAGNOSIS — D649 Anemia, unspecified: Secondary | ICD-10-CM | POA: Diagnosis not present

## 2023-01-15 LAB — ACID FAST SMEAR (AFB, MYCOBACTERIA): Acid Fast Smear: NEGATIVE

## 2023-01-15 LAB — URINALYSIS, W/ REFLEX TO CULTURE (INFECTION SUSPECTED)
Bilirubin Urine: NEGATIVE
Glucose, UA: NEGATIVE mg/dL
Hgb urine dipstick: NEGATIVE
Ketones, ur: NEGATIVE mg/dL
Nitrite: NEGATIVE
Protein, ur: NEGATIVE mg/dL
Specific Gravity, Urine: 1.024 (ref 1.005–1.030)
WBC, UA: >50 WBC/hpf (ref 0–5)
pH: 5 (ref 5.0–8.0)

## 2023-01-15 LAB — CK: Total CK: 19 U/L — ABNORMAL LOW (ref 38–234)

## 2023-01-15 MED ORDER — DAPTOMYCIN-SODIUM CHLORIDE 500-0.9 MG/50ML-% IV SOLN
500.0000 mg | Freq: Every day | INTRAVENOUS | Status: DC
  Administered 2023-01-15: 500 mg via INTRAVENOUS
  Filled 2023-01-15 (×2): qty 50

## 2023-01-15 MED ORDER — METHOCARBAMOL 500 MG PO TABS
500.0000 mg | ORAL_TABLET | Freq: Three times a day (TID) | ORAL | Status: DC | PRN
  Administered 2023-01-15 – 2023-01-23 (×15): 500 mg via ORAL
  Filled 2023-01-15 (×15): qty 1

## 2023-01-15 MED ORDER — SENNOSIDES-DOCUSATE SODIUM 8.6-50 MG PO TABS
2.0000 | ORAL_TABLET | Freq: Two times a day (BID) | ORAL | Status: DC
  Administered 2023-01-15 – 2023-01-27 (×14): 2 via ORAL
  Filled 2023-01-15 (×23): qty 2

## 2023-01-15 MED ORDER — POLYETHYLENE GLYCOL 3350 17 G PO PACK
17.0000 g | PACK | Freq: Two times a day (BID) | ORAL | Status: DC
  Administered 2023-01-15 – 2023-01-20 (×4): 17 g via ORAL
  Filled 2023-01-15 (×20): qty 1

## 2023-01-15 MED ORDER — BISACODYL 10 MG RE SUPP: 10.0000 mg | Freq: Every day | RECTAL | Status: DC | PRN

## 2023-01-15 NOTE — Plan of Care (Signed)
  Problem: Nutrition: Goal: Adequate nutrition will be maintained Outcome: Progressing   Problem: Coping: Goal: Level of anxiety will decrease Outcome: Progressing   Problem: Elimination: Goal: Will not experience complications related to bowel motility Outcome: Progressing   

## 2023-01-15 NOTE — Progress Notes (Signed)
ID brief note   Remains afebrile  12/27 Status post L5-S1 disc aspiration using fluoroscopic guidance. Approximately 2 cc of bloodstained thick aspirate was obtained with 2 passes using a 21-gauge Franseen core biopsy needle. Cx sent as below   Results for orders placed or performed during the hospital encounter of 01/08/23  Blood culture (routine x 2)     Status: None   Collection Time: 01/08/23  1:14 PM   Specimen: BLOOD RIGHT ARM  Result Value Ref Range Status   Specimen Description BLOOD RIGHT ARM  Final   Special Requests   Final    BOTTLES DRAWN AEROBIC AND ANAEROBIC Blood Culture results may not be optimal due to an inadequate volume of blood received in culture bottles   Culture   Final    NO GROWTH 5 DAYS Performed at [facility], [address] [place], [place] [address]    Report Status 01/13/2023 FINAL  Final  MRSA Next Gen by PCR, Nasal     Status: None   Collection Time: 01/09/23  8:07 AM   Specimen: Nasal Mucosa; Nasal Swab  Result Value Ref Range Status   MRSA by PCR Next Gen NOT DETECTED NOT DETECTED Final    Comment: (NOTE) The GeneXpert MRSA Assay (FDA approved for NASAL specimens only), is one component of a comprehensive MRSA colonization surveillance program. It is not intended to diagnose MRSA infection nor to guide or monitor treatment for MRSA infections. Test performance is not FDA approved in patients less than 69 years old. Performed at [facility], [address], [place], [place] [address]   Blood culture (routine x 2)     Status: None   Collection Time: 01/09/23  1:07 PM   Specimen: BLOOD RIGHT ARM  Result Value Ref Range Status   Specimen Description BLOOD RIGHT ARM  Final   Special Requests   Final    BOTTLES DRAWN AEROBIC ONLY Blood Culture results may not be optimal due to an inadequate volume of blood received in culture bottles   Culture   Final    NO GROWTH 5 DAYS Performed at [facility], [address],  [place], [place] [address]    Report Status 01/14/2023 FINAL  Final  Aerobic/Anaerobic Culture w Gram Stain (surgical/deep wound)     Status: None (Preliminary result)   Collection Time: 01/14/23  9:16 AM   Specimen: Back  Result Value Ref Range Status   Specimen Description BACK  Final   Special Requests DISC ASP  Final   Gram Stain   Final    RARE WBC PRESENT, PREDOMINANTLY PMN NO ORGANISMS SEEN Performed at [facility], [address] [place], [place] [address]    Culture PENDING  Incomplete   Report Status PENDING  Incomplete  Culture, fungus without smear     Status: None (Preliminary result)   Collection Time: 01/14/23  9:16 AM   Specimen: Back; Other  Result Value Ref Range Status   Specimen Description BACK  Final   Special Requests DISC ASP  Final   Culture   Final    NO GROWTH < 24 HOURS Performed at [facility], [address], [place], [place] [address]    Report Status PENDING  Incomplete   Started on Daptomycin and cefepime post procedure  Per DIS, Her RPR was 1:512 on July 30, 2022 (done at [employer] [facility] [employer] center) and was treated with doxy x 4 weeks. RPR 12/21 1:  16 with appropriate response to treatment and no need for retreatment   Will follow cultures   Odette Fraction, MD Infectious Disease Physician [facility] [address] [place], [place] [address] Phone: [phone]  Fax: [phone] '
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
## 2023-01-15 NOTE — Plan of Care (Signed)
  Problem: Education: Goal: Knowledge of General Education information will improve Description: Including pain rating scale, medication(s)/side effects and non-pharmacologic comfort measures 01/15/2023 2017 by Kelli Hope, RN Outcome: Progressing 01/15/2023 2015 by Kelli Hope, RN Outcome: Progressing   Problem: Coping: Goal: Level of anxiety will decrease 01/15/2023 2017 by Kelli Hope, RN Outcome: Progressing 01/15/2023 2015 by Kelli Hope, RN Outcome: Progressing   Problem: Elimination: Goal: Will not experience complications related to bowel motility 01/15/2023 2017 by Kelli Hope, RN Outcome: Progressing 01/15/2023 2015 by Kelli Hope, RN Outcome: Progressing Goal: Will not experience complications related to urinary retention 01/15/2023 2017 by Kelli Hope, RN Outcome: Progressing 01/15/2023 2015 by Kelli Hope, RN Outcome: Progressing   Problem: Pain Management: Goal: General experience of comfort will improve 01/15/2023 2017 by Kelli Hope, RN Outcome: Progressing 01/15/2023 2015 by Kelli Hope, RN Outcome: Progressing   Problem: Safety: Goal: Ability to remain free from injury will improve 01/15/2023 2017 by Kelli Hope, RN Outcome: Progressing 01/15/2023 2015 by Kelli Hope, RN Outcome: Progressing   Problem: Skin Integrity: Goal: Risk for impaired skin integrity will decrease 01/15/2023 2017 by Kelli Hope, RN Outcome: Progressing 01/15/2023 2015 by Kelli Hope, RN Outcome: Progressing

## 2023-01-16 DIAGNOSIS — Z86718 Personal history of other venous thrombosis and embolism: Secondary | ICD-10-CM | POA: Diagnosis not present

## 2023-01-16 DIAGNOSIS — M4646 Discitis, unspecified, lumbar region: Secondary | ICD-10-CM | POA: Diagnosis not present

## 2023-01-16 DIAGNOSIS — F39 Unspecified mood [affective] disorder: Secondary | ICD-10-CM | POA: Diagnosis not present

## 2023-01-16 DIAGNOSIS — D649 Anemia, unspecified: Secondary | ICD-10-CM | POA: Diagnosis not present

## 2023-01-16 LAB — URINE CULTURE: Culture: NO GROWTH

## 2023-01-16 MED ORDER — SODIUM CHLORIDE 0.9 % IV SOLN
2.0000 g | INTRAVENOUS | Status: DC
  Administered 2023-01-16 – 2023-01-24 (×45): 2 g via INTRAVENOUS
  Filled 2023-01-16 (×49): qty 2000

## 2023-01-16 MED ORDER — SODIUM CHLORIDE 0.9 % IV SOLN
2.0000 g | Freq: Four times a day (QID) | INTRAVENOUS | Status: DC
  Filled 2023-01-16 (×2): qty 2000

## 2023-01-16 MED ORDER — AMPICILLIN SODIUM 2 G IJ SOLR
2.0000 g | Freq: Four times a day (QID) | INTRAMUSCULAR | Status: DC
  Filled 2023-01-16: qty 2000

## 2023-01-16 MED ORDER — LORAZEPAM 2 MG/ML IJ SOLN
0.5000 mg | Freq: Four times a day (QID) | INTRAMUSCULAR | Status: DC | PRN
  Administered 2023-01-17 (×2): 0.5 mg via INTRAVENOUS
  Filled 2023-01-16 (×2): qty 1

## 2023-01-16 MED ORDER — METHADONE HCL 5 MG PO TABS: 5.0000 mg | ORAL_TABLET | Freq: Every day | ORAL | Status: DC

## 2023-01-16 NOTE — Progress Notes (Signed)
ID brief note  Results for orders placed or performed during the hospital encounter of 01/08/23  Blood culture (routine x 2)     Status: None   Collection Time: 01/08/23  1:14 PM   Specimen: BLOOD RIGHT ARM  Result Value Ref Range Status   Specimen Description BLOOD RIGHT ARM  Final   Special Requests   Final    BOTTLES DRAWN AEROBIC AND ANAEROBIC Blood Culture results may not be optimal due to an inadequate volume of blood received in culture bottles   Culture   Final    NO GROWTH 5 DAYS Performed at [facility], [address], [place], [place] [address]    Report Status 01/13/2023 FINAL  Final  MRSA Next Gen by PCR, Nasal     Status: None   Collection Time: 01/09/23  8:07 AM   Specimen: Nasal Mucosa; Nasal Swab  Result Value Ref Range Status   MRSA by PCR Next Gen NOT DETECTED NOT DETECTED Final    Comment: (NOTE) The GeneXpert MRSA Assay (FDA approved for NASAL specimens only), is one component of a comprehensive MRSA colonization surveillance program. It is not intended to diagnose MRSA infection nor to guide or monitor treatment for MRSA infections. Test performance is not FDA approved in patients less than 67 years old. Performed at [facility], [address] [place], [place] [address]   Blood culture (routine x 2)     Status: None   Collection Time: 01/09/23  1:07 PM   Specimen: BLOOD RIGHT ARM  Result Value Ref Range Status   Specimen Description BLOOD RIGHT ARM  Final   Special Requests   Final    BOTTLES DRAWN AEROBIC ONLY Blood Culture results may not be optimal due to an inadequate volume of blood received in culture bottles   Culture   Final    NO GROWTH 5 DAYS Performed at [facility], [address], [place], [place] [address]    Report Status 01/14/2023 FINAL  Final  Aerobic/Anaerobic Culture w Gram Stain (surgical/deep wound)     Status: None (Preliminary result)   Collection Time: 01/14/23  9:16 AM   Specimen: Back  Result Value  Ref Range Status   Specimen Description BACK  Final   Special Requests DISC ASP  Final   Gram Stain   Final    RARE WBC PRESENT, PREDOMINANTLY PMN NO ORGANISMS SEEN Performed at [facility], [address] [place], [place] [address]    Culture   Final    RARE ESCHERICHIA COLI CRITICAL RESULT CALLED TO, READ BACK BY AND VERIFIED WITH: RN CHANCEY STOUT [phone] AT 0932 BY EC NO ANAEROBES ISOLATED; CULTURE IN PROGRESS FOR 5 DAYS    Report Status PENDING  Incomplete   Organism ID, Bacteria ESCHERICHIA COLI  Final      Susceptibility   Escherichia coli - MIC*    AMPICILLIN <=2 SENSITIVE Sensitive     CEFEPIME <=0.12 SENSITIVE Sensitive     CEFTAZIDIME <=1 SENSITIVE Sensitive     CEFTRIAXONE <=0.25 SENSITIVE Sensitive     CIPROFLOXACIN <=0.25 SENSITIVE Sensitive     GENTAMICIN <=1 SENSITIVE Sensitive     IMIPENEM <=0.25 SENSITIVE Sensitive     TRIMETH/SULFA <=20 SENSITIVE Sensitive     AMPICILLIN/SULBACTAM <=2 SENSITIVE Sensitive     PIP/TAZO <=4 SENSITIVE Sensitive ug/mL    * RARE ESCHERICHIA COLI  Acid Fast Smear (AFB)     Status: None   Collection Time:  01/14/23  9:16 AM   Specimen: Vertebra  Result Value Ref Range Status   AFB Specimen Processing Concentration  Final   Acid Fast Smear Negative  Final    Comment: (NOTE) Performed At: [facility] [address] [place], [place] [id number] Jolene Schimke MD EX:[id number]    Source (AFB) DISC ASP  Final    Comment: Performed at [facility], [address] [place], [place] [address]  Culture, fungus without smear     Status: None (Preliminary result)   Collection Time: 01/14/23  9:16 AM   Specimen: Back; Other  Result Value Ref Range Status   Specimen Description BACK  Final   Special Requests DISC ASP  Final   Culture   Final    NO FUNGUS ISOLATED AFTER 2 DAYS Performed at [facility], [address], [place], [place] [address]    Report Status PENDING  Incomplete  Urine Culture      Status: None   Collection Time: 01/15/23  6:11 AM   Specimen: Urine, Random  Result Value Ref Range Status   Specimen Description URINE, RANDOM  Final   Special Requests NONE Reflexed from [id number]  Final   Culture   Final    NO GROWTH Performed at [facility], [address] [place], [place] [address]    Report Status 01/16/2023 FINAL  Final   Will DC Vancomycin and cefepime and start ampicillin ( pharmacy consult placed) Fu OR cx to completion for final recs  Monitor CBC and BMP Dr Luciana Axe on starting tomorrow  Odette Fraction, MD Infectious Disease Physician [facility] [address] [place], [place] [address] Phone: [phone]  Fax: [phone]

## 2023-01-16 NOTE — Plan of Care (Signed)
°  Problem: Education: Goal: Knowledge of General Education information will improve Description: Including pain rating scale, medication(s)/side effects and non-pharmacologic comfort measures Outcome: Progressing   Problem: Health Behavior/Discharge Planning: Goal: Ability to manage health-related needs will improve Outcome: Not Progressing   Problem: Clinical Measurements: Goal: Ability to maintain clinical measurements within normal limits will improve Outcome: Progressing Goal: Will remain free from infection Outcome: Not Progressing Goal: Diagnostic test results will improve Outcome: Progressing Goal: Respiratory complications will improve Outcome: Progressing Goal: Cardiovascular complication will be avoided Outcome: Progressing   Problem: Activity: Goal: Risk for activity intolerance will decrease Outcome: Not Progressing   Problem: Nutrition: Goal: Adequate nutrition will be maintained Outcome: Progressing

## 2023-01-16 NOTE — Progress Notes (Addendum)
Pharmacy Antibiotic Note  Summer Cain is a 37 y.o. female admitted on 01/08/2023 with  vertebral osteomyelitis .  Pharmacy has been consulted for ampicillin dosing.  Plan: Discontinue daptomycin and cefepime Start ampicillin 2 grams IV every 4 hours Monitor clinical progress, renal function, abx plan ID on board  Height: 5' (152.4 cm) Weight: 48 kg (105 lb 13.1 oz) IBW/kg (Calculated) : 45.5  Temp (24hrs), Avg:98 F (36.7 C), Min:97.8 F (36.6 C), Max:98.2 F (36.8 C)  Recent Labs  Lab 01/10/23 1134 01/13/23 0617  WBC 5.5 5.9  CREATININE  --  0.68    Estimated Creatinine Clearance: 70.5 mL/min (by C-G formula based on SCr of 0.68 mg/dL).    Allergies  Allergen Reactions   Latex Swelling    Antimicrobials this admission: 12/22 vanc >> 12/23 12/22 cefepime >> 12/23; 12/27 >> 12/29 12/27 daptomycin >> 12/29 12/29 ampicillin >>  Dose adjustments this admission:  Microbiology results: 12/22 BCx: ngtdF 12/27 vertebra- ESCHERICHIA COLI  12/22 MRSA PCR: neg  Thank you for allowing pharmacy to be a part of this patients care.   Signe Colt, PharmD 01/16/2023 3:34 PM  **Pharmacist phone directory can be found on [email or web address] listed under [facility]**

## 2023-01-16 NOTE — Progress Notes (Signed)
Inpatient Rehab Admissions Coordinator Note:   Per therapy patient was screened for CIR candidacy by Aleanna Menge Luvenia Starch, CCC-SLP. At this time, pt is pain limited and has not yet attempted transfers. Pt may have potential to progress to becoming a potential CIR candidate. CIR admissions team will follow to monitor progress and participation with therapies. A consult order will be placed if pt appears to be an appropriate candidate.    Wolfgang Phoenix, MS, CCC-SLP Admissions Coordinator [phone] 01/16/23 5:28 PM
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
## 2023-01-17 ENCOUNTER — Encounter (HOSPITAL_COMMUNITY): Payer: Self-pay | Admitting: Radiology

## 2023-01-17 DIAGNOSIS — M4646 Discitis, unspecified, lumbar region: Secondary | ICD-10-CM | POA: Diagnosis not present

## 2023-01-17 DIAGNOSIS — M4626 Osteomyelitis of vertebra, lumbar region: Secondary | ICD-10-CM | POA: Diagnosis not present

## 2023-01-17 DIAGNOSIS — D75839 Thrombocytosis, unspecified: Secondary | ICD-10-CM

## 2023-01-17 DIAGNOSIS — A530 Latent syphilis, unspecified as early or late: Secondary | ICD-10-CM | POA: Diagnosis not present

## 2023-01-17 DIAGNOSIS — F1990 Other psychoactive substance use, unspecified, uncomplicated: Secondary | ICD-10-CM | POA: Diagnosis not present

## 2023-01-17 LAB — CBC WITH DIFFERENTIAL/PLATELET
Abs Immature Granulocytes: 0.02 10*3/uL (ref 0.00–0.07)
Basophils Absolute: 0 10*3/uL (ref 0.0–0.1)
Basophils Relative: 1 %
Eosinophils Absolute: 0.1 10*3/uL (ref 0.0–0.5)
Eosinophils Relative: 2 %
HCT: 32.6 % — ABNORMAL LOW (ref 36.0–46.0)
Hemoglobin: 10 g/dL — ABNORMAL LOW (ref 12.0–15.0)
Immature Granulocytes: 0 %
Lymphocytes Relative: 36 %
Lymphs Abs: 2.1 10*3/uL (ref 0.7–4.0)
MCH: 25.8 pg — ABNORMAL LOW (ref 26.0–34.0)
MCHC: 30.7 g/dL (ref 30.0–36.0)
MCV: 84 fL (ref 80.0–100.0)
Monocytes Absolute: 0.4 10*3/uL (ref 0.1–1.0)
Monocytes Relative: 7 %
Neutro Abs: 3.2 10*3/uL (ref 1.7–7.7)
Neutrophils Relative %: 54 %
Platelets: 453 10*3/uL — ABNORMAL HIGH (ref 150–400)
RBC: 3.88 MIL/uL (ref 3.87–5.11)
RDW: 14 % (ref 11.5–15.5)
WBC: 6 10*3/uL (ref 4.0–10.5)
nRBC: 0 % (ref 0.0–0.2)

## 2023-01-17 LAB — BASIC METABOLIC PANEL
Anion gap: 10 (ref 5–15)
BUN: 14 mg/dL (ref 6–20)
CO2: 28 mmol/L (ref 22–32)
Calcium: 8.9 mg/dL (ref 8.9–10.3)
Chloride: 99 mmol/L (ref 98–111)
Creatinine, Ser: 0.45 mg/dL (ref 0.44–1.00)
GFR, Estimated: >60 mL/min (ref >60)
Glucose, Bld: 102 mg/dL — ABNORMAL HIGH (ref 70–99)
Potassium: 3.9 mmol/L (ref 3.5–5.1)
Sodium: 137 mmol/L (ref 135–145)

## 2023-01-17 LAB — PHOSPHORUS: Phosphorus: 4.8 mg/dL — ABNORMAL HIGH (ref 2.5–4.6)

## 2023-01-17 LAB — MAGNESIUM: Magnesium: 1.9 mg/dL (ref 1.7–2.4)

## 2023-01-17 MED ORDER — DOCUSATE SODIUM 100 MG PO CAPS: 200.0000 mg | ORAL_CAPSULE | Freq: Two times a day (BID) | ORAL | Status: DC

## 2023-01-17 MED ORDER — KETOROLAC TROMETHAMINE 30 MG/ML IJ SOLN
30.0000 mg | Freq: Four times a day (QID) | INTRAMUSCULAR | Status: AC
  Administered 2023-01-17 – 2023-01-22 (×15): 30 mg via INTRAVENOUS
  Filled 2023-01-17 (×15): qty 1

## 2023-01-17 MED ORDER — CLONAZEPAM 0.5 MG PO TABS
0.5000 mg | ORAL_TABLET | Freq: Two times a day (BID) | ORAL | Status: DC | PRN
  Administered 2023-01-17 – 2023-01-27 (×6): 0.5 mg via ORAL
  Filled 2023-01-17 (×7): qty 1

## 2023-01-17 NOTE — Plan of Care (Signed)
  Problem: Clinical Measurements: Goal: Respiratory complications will improve Outcome: Progressing Goal: Cardiovascular complication will be avoided Outcome: Progressing   Problem: Pain Management: Goal: General experience of comfort will improve Outcome: Progressing   Problem: Skin Integrity: Goal: Risk for impaired skin integrity will decrease Outcome: Progressing

## 2023-01-17 NOTE — Assessment & Plan Note (Signed)
On lovenox 40 mg sq daily.

## 2023-01-17 NOTE — Progress Notes (Signed)
Pt stated that she removed the previous Nicoderm patch. She also refused the new patch. Note in [facility] that per pt she removed the old patch.
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
## 2023-01-17 NOTE — Assessment & Plan Note (Signed)
Chronic. 
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
## 2023-01-17 NOTE — Assessment & Plan Note (Addendum)
01-17-2023 no evidence of iron deficiency. Her anemia is due to chronic medical illness compounded by drug abuse. Iron/TIBC/Ferritin/ %Sat    Component Value Date/Time   IRON 93 12/22/2020 1358   TIBC 417 12/22/2020 1358   FERRITIN 40 12/22/2020 1358   IRONPCTSAT 22 12/22/2020 1358

## 2023-01-17 NOTE — Assessment & Plan Note (Signed)
01-17-2023 plts 453K. Stable. Down from 580K(9 days ago)

## 2023-01-17 NOTE — Assessment & Plan Note (Signed)
01-17-2023 chronic.

## 2023-01-17 NOTE — Assessment & Plan Note (Signed)
Per ID note from 01-13-2023, "Will plan tx after acute issues resolve. To f/u outpatient"
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
## 2023-01-17 NOTE — Progress Notes (Signed)
[facility]   Reason for visit: Follow up on discitis/osteomyelitis   Interval History: culture from disc aspiration with E coli pansensitive; WBC 6.0, remains afebrile.  Main complaint is pain.   Day 4 antibiotics  Physical Exam: Constitutional:  Vitals:   01/17/23 0331 01/17/23 0832  BP: 92/68 96/62  Pulse: 93 91  Resp: 16 19  Temp: 97.9 F (36.6 C) 98 F (36.7 C)  SpO2: 99% 100%   patient appears in NAD Respiratory: Normal respiratory effort  Review of Systems: Constitutional: negative for fevers and chills  Lab Results  Component Value Date   WBC 6.0 01/17/2023   HGB 10.0 (L) 01/17/2023   HCT 32.6 (L) 01/17/2023   MCV 84.0 01/17/2023   PLT 453 (H) 01/17/2023    Lab Results  Component Value Date   CREATININE 0.45 01/17/2023   BUN 14 01/17/2023   NA 137 01/17/2023   K 3.9 01/17/2023   CL 99 01/17/2023   CO2 28 01/17/2023    Lab Results  Component Value Date   ALT 16 01/13/2023   AST 19 01/13/2023   ALKPHOS 86 01/13/2023     Microbiology: Recent Results (from the past 240 hours)  Blood culture (routine x 2)     Status: None   Collection Time: 01/08/23  1:14 PM   Specimen: BLOOD RIGHT ARM  Result Value Ref Range Status   Specimen Description BLOOD RIGHT ARM  Final   Special Requests   Final    BOTTLES DRAWN AEROBIC AND ANAEROBIC Blood Culture results may not be optimal due to an inadequate volume of blood received in culture bottles   Culture   Final    NO GROWTH 5 DAYS Performed at [facility], [address] [place], [place] [address]    Report Status 01/13/2023 FINAL  Final  MRSA Next Gen by PCR, Nasal     Status: None   Collection Time: 01/09/23  8:07 AM   Specimen: Nasal Mucosa; Nasal Swab  Result Value Ref Range Status   MRSA by PCR Next Gen NOT DETECTED NOT DETECTED Final    Comment: (NOTE) The GeneXpert MRSA Assay (FDA approved for NASAL specimens only), is one component of a comprehensive MRSA  colonization surveillance program. It is not intended to diagnose MRSA infection nor to guide or monitor treatment for MRSA infections. Test performance is not FDA approved in patients less than 12 years old. Performed at [facility], [address] [place], [place] [address]   Blood culture (routine x 2)     Status: None   Collection Time: 01/09/23  1:07 PM   Specimen: BLOOD RIGHT ARM  Result Value Ref Range Status   Specimen Description BLOOD RIGHT ARM  Final   Special Requests   Final    BOTTLES DRAWN AEROBIC ONLY Blood Culture results may not be optimal due to an inadequate volume of blood received in culture bottles   Culture   Final    NO GROWTH 5 DAYS Performed at [facility], [address], [place], [place] [address]    Report Status 01/14/2023 FINAL  Final  Aerobic/Anaerobic Culture w Gram Stain (surgical/deep wound)     Status: None (Preliminary result)   Collection Time: 01/14/23  9:16 AM   Specimen: Back  Result Value Ref Range Status   Specimen Description BACK  Final   Special Requests DISC ASP  Final   Gram Stain   Final  RARE WBC PRESENT, PREDOMINANTLY PMN NO ORGANISMS SEEN Performed at [facility], [address], [place], [place] [address]    Culture   Final    RARE ESCHERICHIA COLI CRITICAL RESULT CALLED TO, READ BACK BY AND VERIFIED WITH: RN CHANCEY STOUT [id number] AT 0932 BY EC NO ANAEROBES ISOLATED; CULTURE IN PROGRESS FOR 5 DAYS    Report Status PENDING  Incomplete   Organism ID, Bacteria ESCHERICHIA COLI  Final      Susceptibility   Escherichia coli - MIC*    AMPICILLIN <=2 SENSITIVE Sensitive     CEFEPIME <=0.12 SENSITIVE Sensitive     CEFTAZIDIME <=1 SENSITIVE Sensitive     CEFTRIAXONE <=0.25 SENSITIVE Sensitive     CIPROFLOXACIN <=0.25 SENSITIVE Sensitive     GENTAMICIN <=1 SENSITIVE Sensitive     IMIPENEM <=0.25 SENSITIVE Sensitive     TRIMETH/SULFA <=20 SENSITIVE Sensitive     AMPICILLIN/SULBACTAM <=2 SENSITIVE  Sensitive     PIP/TAZO <=4 SENSITIVE Sensitive ug/mL    * RARE ESCHERICHIA COLI  Acid Fast Smear (AFB)     Status: None   Collection Time: 01/14/23  9:16 AM   Specimen: Vertebra  Result Value Ref Range Status   AFB Specimen Processing Concentration  Final   Acid Fast Smear Negative  Final    Comment: (NOTE) Performed At: [facility] [address] [place], [place] [id number] Jolene Schimke MD WE:[id number]    Source (AFB) DISC ASP  Final    Comment: Performed at [facility], [address] [place], [place] [address]  Culture, fungus without smear     Status: None (Preliminary result)   Collection Time: 01/14/23  9:16 AM   Specimen: Back; Other  Result Value Ref Range Status   Specimen Description BACK  Final   Special Requests DISC ASP  Final   Culture   Final    NO FUNGUS ISOLATED AFTER 2 DAYS Performed at [facility], [address] [place], [place] [address]    Report Status PENDING  Incomplete  Urine Culture     Status: None   Collection Time: 01/15/23  6:11 AM   Specimen: Urine, Random  Result Value Ref Range Status   Specimen Description URINE, RANDOM  Final   Special Requests NONE Reflexed from [id number]  Final   Culture   Final    NO GROWTH Performed at [facility], [address], [place], [place] [address]    Report Status 01/16/2023 FINAL  Final    Impression/Plan:  1. Discitis/osteomyelitis L5-S1 - positive culture with E coli and on ampicillin.   Will need a prolonged course of antibiotics and plan to continue with IV antibiotics for now and consider at least 2 weeks before transition to oral antibiotics to assure improvement.    2.  Pain - consider toradol  3.  Substance abuse - will need continued efforts at remaining drug-free  Discussed with Dr. Imogene Burn

## 2023-01-17 NOTE — Assessment & Plan Note (Signed)
Per DIS, Her RPR was 1:512 on July 30, 2022 (done at [facility] center) and was treated with doxy x 4 weeks. RPR 12/21 1: 16 with appropriate response to treatment and no need for retreatment

## 2023-01-18 ENCOUNTER — Encounter (HOSPITAL_COMMUNITY): Payer: Self-pay

## 2023-01-18 DIAGNOSIS — M4626 Osteomyelitis of vertebra, lumbar region: Secondary | ICD-10-CM | POA: Diagnosis not present

## 2023-01-18 DIAGNOSIS — F1990 Other psychoactive substance use, unspecified, uncomplicated: Secondary | ICD-10-CM | POA: Diagnosis not present

## 2023-01-18 DIAGNOSIS — E43 Unspecified severe protein-calorie malnutrition: Secondary | ICD-10-CM | POA: Insufficient documentation

## 2023-01-18 DIAGNOSIS — A530 Latent syphilis, unspecified as early or late: Secondary | ICD-10-CM | POA: Diagnosis not present

## 2023-01-18 DIAGNOSIS — M4646 Discitis, unspecified, lumbar region: Secondary | ICD-10-CM | POA: Diagnosis not present

## 2023-01-18 LAB — CBC WITH DIFFERENTIAL/PLATELET
Abs Immature Granulocytes: 0.03 10*3/uL (ref 0.00–0.07)
Basophils Absolute: 0 10*3/uL (ref 0.0–0.1)
Basophils Relative: 0 %
Eosinophils Absolute: 0.1 10*3/uL (ref 0.0–0.5)
Eosinophils Relative: 2 %
HCT: 33 % — ABNORMAL LOW (ref 36.0–46.0)
Hemoglobin: 10.4 g/dL — ABNORMAL LOW (ref 12.0–15.0)
Immature Granulocytes: 1 %
Lymphocytes Relative: 31 %
Lymphs Abs: 1.9 10*3/uL (ref 0.7–4.0)
MCH: 26.2 pg (ref 26.0–34.0)
MCHC: 31.5 g/dL (ref 30.0–36.0)
MCV: 83.1 fL (ref 80.0–100.0)
Monocytes Absolute: 0.4 10*3/uL (ref 0.1–1.0)
Monocytes Relative: 7 %
Neutro Abs: 3.6 10*3/uL (ref 1.7–7.7)
Neutrophils Relative %: 59 %
Platelets: 482 10*3/uL — ABNORMAL HIGH (ref 150–400)
RBC: 3.97 MIL/uL (ref 3.87–5.11)
RDW: 14.1 % (ref 11.5–15.5)
WBC: 6 10*3/uL (ref 4.0–10.5)
nRBC: 0 % (ref 0.0–0.2)

## 2023-01-18 LAB — BASIC METABOLIC PANEL
Anion gap: 11 (ref 5–15)
BUN: 14 mg/dL (ref 6–20)
CO2: 26 mmol/L (ref 22–32)
Calcium: 9.1 mg/dL (ref 8.9–10.3)
Chloride: 102 mmol/L (ref 98–111)
Creatinine, Ser: 0.47 mg/dL (ref 0.44–1.00)
GFR, Estimated: >60 mL/min (ref >60)
Glucose, Bld: 80 mg/dL (ref 70–99)
Potassium: 4.3 mmol/L (ref 3.5–5.1)
Sodium: 139 mmol/L (ref 135–145)

## 2023-01-18 MED ORDER — ONDANSETRON 4 MG PO TBDP
8.0000 mg | ORAL_TABLET | Freq: Three times a day (TID) | ORAL | Status: DC | PRN
  Administered 2023-01-18: 8 mg via ORAL
  Filled 2023-01-18: qty 2

## 2023-01-18 MED ORDER — SODIUM CHLORIDE 0.9% FLUSH: 10.0000 mL | Freq: Two times a day (BID) | INTRAVENOUS | Status: DC

## 2023-01-18 MED ORDER — SODIUM CHLORIDE 0.9% FLUSH: 10.0000 mL | INTRAVENOUS | Status: DC | PRN
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
## 2023-01-18 NOTE — Progress Notes (Signed)
Pt refusing toradol. She states she didn't like how she felt after taking medication

## 2023-01-18 NOTE — Plan of Care (Signed)

## 2023-01-18 NOTE — Assessment & Plan Note (Signed)
01-18-2023 see RD dated today. "Severe Malnutrition related to chronic illness as evidenced by severe muscle depletion, severe fat depletion. "

## 2023-01-18 NOTE — Progress Notes (Signed)
   Repeat EKG, now off methadone shows Qtc 452 msec. Will stop IM Tigan. Prn zofran SL.    Carollee Herter, DO [facility]

## 2023-01-18 NOTE — Progress Notes (Addendum)
 Nutrition Follow-up  DOCUMENTATION CODES:   Severe malnutrition in context of chronic illness  INTERVENTION:   - Continue Regular diet and encourage PO intake  - Ensure Enlive po TID, each supplement provides 350 kcal and 20 grams of protein, pt prefers chocolate flavor - Magic cup TID with meals, each supplement provides 290 kcal and 9 grams of protein cup - MVI with minerals daily  NUTRITION DIAGNOSIS:   Severe Malnutrition related to chronic illness as evidenced by severe muscle depletion, severe fat depletion.  GOAL:   Patient will meet greater than or equal to 90% of their needs  - Ongoing   MONITOR:   PO intake, Weight trends, Supplement acceptance, I & O's, Labs  REASON FOR ASSESSMENT:   Malnutrition Screening Tool, Consult Assessment of nutrition requirement/status  ASSESSMENT:   37 year old female who presented to the ED on 12/21 with lower back pain. 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 Pt admitted with discitis, osteomyelitis, epidural phlegmon.  12/22 - Regular diet 12/24 - s/p attempted L5-S1 disc aspiration that was aborted due to poor visualization and pt movement   Pt resting in bed on visit. She was hard to engage in conversation at times and veered off to another topic quickly. She reports eating good and has an appetite. She had 100% of her breakfast, noted 3 slices of cake and soda on tray bedside. She does not know how many Ensures she is drinking. Per Meds history she is drinking 2-3 Ensures/day. She complained of having pain in her legs, states they feel numb. Was able to ambulate to the bathroom with assistance.   Pt appears thin with severe muscle and fat wasting. Pt seemed to be weight stable until June of this year where  her weight started to trend down. She has lost 7 lbs, 6% wt loss in 6 months.   Admit weight: 48 kg  Current weight: 46.8 kg    Average Meal Intake: 12/26-12/30: 80% intake x 8 recorded meals  Nutritionally Relevant Medications: Scheduled Meds:  enoxaparin  (LOVENOX ) injection  40 mg Subcutaneous Q24H   feeding supplement  237 mL Oral TID BM   gabapentin   200 mg Oral BID   ketorolac   30 mg Intravenous Q6H   multivitamin with minerals  1 tablet Oral Daily   nicotine   21 mg Transdermal Daily   polyethylene glycol  17 g Oral BID   senna-docusate  2 tablet Oral BID   sodium chloride  flush  3 mL Intravenous Q12H   Continuous Infusions:  ampicillin  (OMNIPEN) IV 2 g (01/18/23 1429)    Labs Reviewed: Phosphorus 4.8 (12/30)  NUTRITION - FOCUSED PHYSICAL EXAM:  Flowsheet Row Most Recent Value  Orbital Region Unable to assess  Upper Arm Region Severe depletion  Thoracic and Lumbar Region Severe depletion  Buccal Region Unable to assess  Temple Region Severe depletion  Clavicle Bone Region Severe depletion  Clavicle and Acromion Bone Region Severe depletion  Scapular Bone Region Unable to assess  Dorsal Hand Severe depletion  Patellar Region Severe depletion  Anterior Thigh Region Severe depletion  Posterior Calf Region Severe depletion  Edema (RD Assessment) None  Hair Unable to assess  Eyes Unable to assess  Mouth Unable to assess  Skin Reviewed  Nails Reviewed       Diet Order:   Diet Order  Diet regular Room service appropriate? Yes; Fluid consistency: Thin  Diet effective now                   EDUCATION NEEDS:   Not appropriate for education at this time  Skin:  Skin Assessment: Reviewed RN Assessment  Last BM:  12/30  Height:   Ht Readings from Last 1 Encounters:  01/14/23 5' (1.524 m)    Weight:   Wt Readings from Last 1 Encounters:  01/18/23 46.8 kg    Ideal Body Weight:  45.45 kg  BMI:  Body mass index is 20.15  kg/m.  Estimated Nutritional Needs:   Kcal:  1800-2000  Protein:  80-95 grams  Fluid:  >1.8 L   Olivia Kenning, RD Registered Dietitian  See Amion for more information

## 2023-01-19 DIAGNOSIS — M4646 Discitis, unspecified, lumbar region: Secondary | ICD-10-CM | POA: Diagnosis not present

## 2023-01-19 DIAGNOSIS — E43 Unspecified severe protein-calorie malnutrition: Secondary | ICD-10-CM | POA: Diagnosis not present

## 2023-01-19 DIAGNOSIS — F1990 Other psychoactive substance use, unspecified, uncomplicated: Secondary | ICD-10-CM | POA: Diagnosis not present

## 2023-01-19 DIAGNOSIS — M4626 Osteomyelitis of vertebra, lumbar region: Secondary | ICD-10-CM | POA: Diagnosis not present

## 2023-01-19 LAB — AEROBIC/ANAEROBIC CULTURE W GRAM STAIN (SURGICAL/DEEP WOUND)

## 2023-01-19 LAB — BASIC METABOLIC PANEL
Anion gap: 8 (ref 5–15)
BUN: 15 mg/dL (ref 6–20)
CO2: 28 mmol/L (ref 22–32)
Calcium: 9.2 mg/dL (ref 8.9–10.3)
Chloride: 101 mmol/L (ref 98–111)
Creatinine, Ser: 0.56 mg/dL (ref 0.44–1.00)
GFR, Estimated: >60 mL/min (ref >60)
Glucose, Bld: 117 mg/dL — ABNORMAL HIGH (ref 70–99)
Potassium: 3.8 mmol/L (ref 3.5–5.1)
Sodium: 137 mmol/L (ref 135–145)

## 2023-01-19 LAB — CBC WITH DIFFERENTIAL/PLATELET
Abs Immature Granulocytes: 0.04 10*3/uL (ref 0.00–0.07)
Basophils Absolute: 0 10*3/uL (ref 0.0–0.1)
Basophils Relative: 0 %
Eosinophils Absolute: 0.2 10*3/uL (ref 0.0–0.5)
Eosinophils Relative: 3 %
HCT: 31.7 % — ABNORMAL LOW (ref 36.0–46.0)
Hemoglobin: 9.8 g/dL — ABNORMAL LOW (ref 12.0–15.0)
Immature Granulocytes: 1 %
Lymphocytes Relative: 37 %
Lymphs Abs: 2.5 10*3/uL (ref 0.7–4.0)
MCH: 25.7 pg — ABNORMAL LOW (ref 26.0–34.0)
MCHC: 30.9 g/dL (ref 30.0–36.0)
MCV: 83.2 fL (ref 80.0–100.0)
Monocytes Absolute: 0.4 10*3/uL (ref 0.1–1.0)
Monocytes Relative: 6 %
Neutro Abs: 3.6 10*3/uL (ref 1.7–7.7)
Neutrophils Relative %: 53 %
Platelets: 441 10*3/uL — ABNORMAL HIGH (ref 150–400)
RBC: 3.81 MIL/uL — ABNORMAL LOW (ref 3.87–5.11)
RDW: 14.3 % (ref 11.5–15.5)
WBC: 6.8 10*3/uL (ref 4.0–10.5)
nRBC: 0 % (ref 0.0–0.2)

## 2023-01-19 NOTE — Plan of Care (Signed)
   Problem: Education: Goal: Knowledge of General Education information will improve Description Including pain rating scale, medication(s)/side effects and non-pharmacologic comfort measures Outcome: Progressing

## 2023-01-19 NOTE — Plan of Care (Signed)
   Problem: Education: Goal: Knowledge of General Education information will improve Description: Including pain rating scale, medication(s)/side effects and non-pharmacologic comfort measures Outcome: Progressing   Problem: Health Behavior/Discharge Planning: Goal: Ability to manage health-related needs will improve Outcome: Progressing   Problem: Clinical Measurements: Goal: Will remain free from infection Outcome: Progressing Goal: Diagnostic test results will improve Outcome: Progressing Goal: Respiratory complications will improve Outcome: Progressing Goal: Cardiovascular complication will be avoided Outcome: Progressing   Problem: Nutrition: Goal: Adequate nutrition will be maintained Outcome: Progressing   Problem: Coping: Goal: Level of anxiety will decrease Outcome: Progressing
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
## 2023-01-20 DIAGNOSIS — E43 Unspecified severe protein-calorie malnutrition: Secondary | ICD-10-CM | POA: Diagnosis not present

## 2023-01-20 DIAGNOSIS — F1990 Other psychoactive substance use, unspecified, uncomplicated: Secondary | ICD-10-CM | POA: Diagnosis not present

## 2023-01-20 DIAGNOSIS — M4626 Osteomyelitis of vertebra, lumbar region: Secondary | ICD-10-CM

## 2023-01-20 DIAGNOSIS — M4646 Discitis, unspecified, lumbar region: Secondary | ICD-10-CM | POA: Diagnosis not present

## 2023-01-20 LAB — CBC WITH DIFFERENTIAL/PLATELET
Abs Immature Granulocytes: 0.02 10*3/uL (ref 0.00–0.07)
Basophils Absolute: 0 10*3/uL (ref 0.0–0.1)
Basophils Relative: 0 %
Eosinophils Absolute: 0.1 10*3/uL (ref 0.0–0.5)
Eosinophils Relative: 2 %
HCT: 30.4 % — ABNORMAL LOW (ref 36.0–46.0)
Hemoglobin: 9.6 g/dL — ABNORMAL LOW (ref 12.0–15.0)
Immature Granulocytes: 0 %
Lymphocytes Relative: 34 %
Lymphs Abs: 2.3 10*3/uL (ref 0.7–4.0)
MCH: 26.3 pg (ref 26.0–34.0)
MCHC: 31.6 g/dL (ref 30.0–36.0)
MCV: 83.3 fL (ref 80.0–100.0)
Monocytes Absolute: 0.5 10*3/uL (ref 0.1–1.0)
Monocytes Relative: 8 %
Neutro Abs: 3.8 10*3/uL (ref 1.7–7.7)
Neutrophils Relative %: 56 %
Platelets: 447 10*3/uL — ABNORMAL HIGH (ref 150–400)
RBC: 3.65 MIL/uL — ABNORMAL LOW (ref 3.87–5.11)
RDW: 14.4 % (ref 11.5–15.5)
WBC: 6.8 10*3/uL (ref 4.0–10.5)
nRBC: 0 % (ref 0.0–0.2)

## 2023-01-20 LAB — BASIC METABOLIC PANEL
Anion gap: 10 (ref 5–15)
BUN: 20 mg/dL (ref 6–20)
CO2: 26 mmol/L (ref 22–32)
Calcium: 8.7 mg/dL — ABNORMAL LOW (ref 8.9–10.3)
Chloride: 102 mmol/L (ref 98–111)
Creatinine, Ser: 0.65 mg/dL (ref 0.44–1.00)
GFR, Estimated: >60 mL/min (ref >60)
Glucose, Bld: 95 mg/dL (ref 70–99)
Potassium: 3.7 mmol/L (ref 3.5–5.1)
Sodium: 138 mmol/L (ref 135–145)
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
## 2023-01-20 NOTE — Plan of Care (Signed)
  Problem: Education: Goal: Knowledge of General Education information will improve Description: Including pain rating scale, medication(s)/side effects and non-pharmacologic comfort measures Outcome: Progressing   Problem: Health Behavior/Discharge Planning: Goal: Ability to manage health-related needs will improve Outcome: Progressing   Problem: Clinical Measurements: Goal: Ability to maintain clinical measurements within normal limits will improve Outcome: Progressing Goal: Will remain free from infection Outcome: Progressing Goal: Diagnostic test results will improve Outcome: Progressing   Problem: Nutrition: Goal: Adequate nutrition will be maintained Outcome: Progressing   Problem: Coping: Goal: Level of anxiety will decrease Outcome: Progressing   Problem: Elimination: Goal: Will not experience complications related to bowel motility Outcome: Progressing   Problem: Pain Management: Goal: General experience of comfort will improve Outcome: Progressing   Problem: Safety: Goal: Ability to remain free from injury will improve Outcome: Progressing   Problem: Skin Integrity: Goal: Risk for impaired skin integrity will decrease Outcome: Progressing

## 2023-01-20 NOTE — Plan of Care (Signed)
  Problem: Education: Goal: Knowledge of General Education information will improve Description: Including pain rating scale, medication(s)/side effects and non-pharmacologic comfort measures Outcome: Progressing   Problem: Health Behavior/Discharge Planning: Goal: Ability to manage health-related needs will improve Outcome: Progressing   Problem: Clinical Measurements: Goal: Ability to maintain clinical measurements within normal limits will improve Outcome: Progressing Goal: Will remain free from infection Outcome: Progressing Goal: Diagnostic test results will improve Outcome: Progressing   Problem: Nutrition: Goal: Adequate nutrition will be maintained Outcome: Progressing   Problem: Elimination: Goal: Will not experience complications related to bowel motility Outcome: Progressing   
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
## 2023-01-21 DIAGNOSIS — Z8679 Personal history of other diseases of the circulatory system: Secondary | ICD-10-CM | POA: Diagnosis not present

## 2023-01-21 DIAGNOSIS — F1910 Other psychoactive substance abuse, uncomplicated: Secondary | ICD-10-CM | POA: Diagnosis not present

## 2023-01-21 DIAGNOSIS — M4626 Osteomyelitis of vertebra, lumbar region: Secondary | ICD-10-CM | POA: Diagnosis not present

## 2023-01-21 DIAGNOSIS — F1990 Other psychoactive substance use, unspecified, uncomplicated: Secondary | ICD-10-CM | POA: Diagnosis not present

## 2023-01-21 DIAGNOSIS — M4646 Discitis, unspecified, lumbar region: Secondary | ICD-10-CM | POA: Diagnosis not present

## 2023-01-21 DIAGNOSIS — D75839 Thrombocytosis, unspecified: Secondary | ICD-10-CM | POA: Diagnosis not present

## 2023-01-21 DIAGNOSIS — B182 Chronic viral hepatitis C: Secondary | ICD-10-CM

## 2023-01-21 LAB — BASIC METABOLIC PANEL
Anion gap: 8 (ref 5–15)
BUN: 18 mg/dL (ref 6–20)
CO2: 26 mmol/L (ref 22–32)
Calcium: 9 mg/dL (ref 8.9–10.3)
Chloride: 106 mmol/L (ref 98–111)
Creatinine, Ser: 0.51 mg/dL (ref 0.44–1.00)
GFR, Estimated: >60 mL/min (ref >60)
Glucose, Bld: 119 mg/dL — ABNORMAL HIGH (ref 70–99)
Potassium: 3.5 mmol/L (ref 3.5–5.1)
Sodium: 140 mmol/L (ref 135–145)

## 2023-01-21 NOTE — Progress Notes (Signed)
 [facility]  Date of Admission:  01/08/2023      Total days of antibiotics 8          ASSESSMENT: Summer Cain is a 38 y.o. female presenting to the hospital with:    L5-S1 Discitis/Osteomyelitis with Ventral Epidural Phlegmon -  [place] Coli (pan sensitive) -  Today she reports her back pain is better. She is more comfortable in our exam and discussion in her room. She has been accepting the toradol  now for pain management which seems to be helping in conjunction with opioids. She still has some trouble getting up and would benefit from ongoing IV antibiotics, PT and reassessment in another week to determine if she can be converted to PO treatment and consider outpatient care  -Continue ampicillin  IV  -reassessment for POSSIBLE PO next Thursday 01/27/2023) -ESR / CRP next week.  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  No evidence of heart valve involvement on TTE and infact all valve structures appear normal.  -She should get prophylactic dental amoxicillin  in the future  -nothing further needed now   Substance Use Disorder -  -she understands these infections are a result of injection use     Syphilis History -  Called state lab and she was treated with 4 weeks doxycycline  in July 2024 with titer 1:512. Titers today have since reduced to 1:16 indicating adequate treatment and 4 fold reduction. This titer seems to reflect recovering treated disease. She requested re-treatment to ensure any possibility of new infection which we provided.    H/O Hepatitis C -  Hep C Ab remains reactive as expected --> GT 1a with VL 9 million in 2022.  -no urgency to treat, will consider outpatient after acute infectious problem  resolves.     PLAN: Would recommend next week to try to get her on PO pain management in preparation for possible D/C  Continue ampicillin   ESR - CRP next Wednesday  Encouraged PT    Principal Problem:   Discitis - with E. 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   History of endocarditis   Normocytic anemia   Thrombocytosis   History of DVT (deep vein thrombosis)   Prolonged QT interval   Mood disorder (HCC)   Positive RPR test   Protein-calorie malnutrition, severe    enoxaparin  (LOVENOX ) injection  40 mg Subcutaneous Q24H   feeding supplement  237 mL Oral TID BM   gabapentin   200 mg Oral BID   ketorolac   30 mg Intravenous Q6H   multivitamin with minerals  1 tablet Oral Daily   nicotine   21 mg Transdermal Daily   polyethylene glycol  17 g Oral BID   senna-docusate  2 tablet Oral BID   sodium chloride  flush  3 mL Intravenous Q12H    SUBJECTIVE: Feeling better.   Review of Systems: Review of Systems  Constitutional:  Negative for chills, fever, malaise/fatigue and weight loss.  HENT:  Negative for sore throat.        No dental problems  Respiratory:  Negative for cough and sputum production.  Cardiovascular:  Negative for chest pain and leg swelling.  Gastrointestinal:  Negative for abdominal pain, diarrhea and vomiting.  Genitourinary:  Negative for dysuria and flank pain.  Musculoskeletal:  Positive for back pain. Negative for joint pain, myalgias and neck pain.  Skin:  Negative for rash.  Neurological:  Negative for dizziness, tingling and headaches.  Psychiatric/Behavioral:  Negative for depression and substance abuse. The patient is not nervous/anxious and does not have insomnia.     Allergies  Allergen Reactions   Latex Swelling    OBJECTIVE: Vitals:   01/20/23 2031 01/21/23 0423 01/21/23 0500 01/21/23 0905  BP: 98/67 109/68  102/73  Pulse:  78 78  65  Resp:    18  Temp: 98 F (36.7 C) 98 F (36.7 C)  98.1 F (36.7 C)  TempSrc: Oral     SpO2: 98% 99%  99%  Weight:   47.3 kg   Height:       Body mass index is 20.37 kg/m.  Physical Exam Constitutional:      Comments: Chronically ill appearing, thin  HENT:     Mouth/Throat:     Mouth: Mucous membranes are moist.  Cardiovascular:     Rate and Rhythm: Normal rate and regular rhythm.     Heart sounds: No murmur heard. Abdominal:     General: Bowel sounds are normal. There is no distension.     Palpations: Abdomen is soft.  Skin:    General: Skin is warm and dry.  Neurological:     Mental Status: She is alert and oriented to person, place, and time.     Lab Results Lab Results  Component Value Date   WBC 6.8 01/20/2023   HGB 9.6 (L) 01/20/2023   HCT 30.4 (L) 01/20/2023   MCV 83.3 01/20/2023   PLT 447 (H) 01/20/2023    Lab Results  Component Value Date   CREATININE 0.51 01/21/2023   BUN 18 01/21/2023   NA 140 01/21/2023   K 3.5 01/21/2023   CL 106 01/21/2023   CO2 26 01/21/2023    Lab Results  Component Value Date   ALT 16 01/13/2023   AST 19 01/13/2023   ALKPHOS 86 01/13/2023   BILITOT 0.3 01/13/2023     Microbiology: Recent Results (from the past 240 hours)  Aerobic/Anaerobic Culture w Gram Stain (surgical/deep wound)     Status: None   Collection Time: 01/14/23  9:16 AM   Specimen: Back  Result Value Ref Range Status   Specimen Description BACK  Final   Special Requests DISC ASP  Final   Gram Stain   Final    RARE WBC PRESENT, PREDOMINANTLY PMN NO ORGANISMS SEEN    Culture   Final    RARE ESCHERICHIA COLI CRITICAL RESULT CALLED TO, READ BACK BY AND VERIFIED WITH: RN CHANCEY STOUT [id number] AT 0932 BY EC NO ANAEROBES ISOLATED Performed at [facility], [address] [place], [place] [address]    Report Status 01/19/2023 FINAL  Final   Organism ID, Bacteria ESCHERICHIA COLI  Final      Susceptibility   Escherichia coli -  MIC*    AMPICILLIN  <=2 SENSITIVE Sensitive     CEFEPIME  <=0.12 SENSITIVE Sensitive     CEFTAZIDIME <=1 SENSITIVE Sensitive     CEFTRIAXONE  <=0.25 SENSITIVE Sensitive     CIPROFLOXACIN  <=0.25 SENSITIVE Sensitive     GENTAMICIN <=1 SENSITIVE Sensitive     IMIPENEM <=0.25 SENSITIVE Sensitive     TRIMETH /SULFA  <=20 SENSITIVE  Sensitive     AMPICILLIN /SULBACTAM <=2 SENSITIVE Sensitive     PIP/TAZO <=4 SENSITIVE Sensitive ug/mL    * RARE ESCHERICHIA COLI  Acid Fast Smear (AFB)     Status: None   Collection Time: 01/14/23  9:16 AM   Specimen: Vertebra  Result Value Ref Range Status   AFB Specimen Processing Concentration  Final   Acid Fast Smear Negative  Final    Comment: (NOTE) Performed At: [facility] [address] [place], [place] [id number] Jennette Shorter MD Ey:[id number]    Source (AFB) DISC ASP  Final    Comment: Performed at [facility], [address] [place], [place] [address]  Culture, fungus without smear     Status: None (Preliminary result)   Collection Time: 01/14/23  9:16 AM   Specimen: Back; Other  Result Value Ref Range Status   Specimen Description BACK  Final   Special Requests DISC ASP  Final   Culture   Final    NO FUNGUS ISOLATED AFTER 6 DAYS Performed at [facility], [address] [place], [place] [address]    Report Status PENDING  Incomplete  Urine Culture     Status: None   Collection Time: 01/15/23  6:11 AM   Specimen: Urine, Random  Result Value Ref Range Status   Specimen Description URINE, RANDOM  Final   Special Requests NONE Reflexed from [id number]  Final   Culture   Final    NO GROWTH Performed at [facility], [address] [place], [place] [address]    Report Status 01/16/2023 FINAL  Final     Corean Fireman, MSN, NP-C [facility]  [email or web address] Pager: [phone] Office: [phone] [facility] Main Line: [phone] *Secure Chat  Communication Welcome

## 2023-01-21 NOTE — Plan of Care (Signed)
  Problem: Education: Goal: Knowledge of General Education information will improve Description: Including pain rating scale, medication(s)/side effects and non-pharmacologic comfort measures Outcome: Progressing   Problem: Activity: Goal: Risk for activity intolerance will decrease Outcome: Progressing   Problem: Nutrition: Goal: Adequate nutrition will be maintained Outcome: Progressing   Problem: Coping: Goal: Level of anxiety will decrease Outcome: Progressing   Problem: Elimination: Goal: Will not experience complications related to bowel motility Outcome: Progressing   Problem: Pain Management: Goal: General experience of comfort will improve Outcome: Progressing   Problem: Safety: Goal: Ability to remain free from injury will improve Outcome: Progressing
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
## 2023-01-22 DIAGNOSIS — M4626 Osteomyelitis of vertebra, lumbar region: Secondary | ICD-10-CM | POA: Diagnosis not present

## 2023-01-22 DIAGNOSIS — F1990 Other psychoactive substance use, unspecified, uncomplicated: Secondary | ICD-10-CM | POA: Diagnosis not present

## 2023-01-22 DIAGNOSIS — M4646 Discitis, unspecified, lumbar region: Secondary | ICD-10-CM | POA: Diagnosis not present

## 2023-01-22 DIAGNOSIS — E876 Hypokalemia: Secondary | ICD-10-CM | POA: Insufficient documentation

## 2023-01-22 LAB — BASIC METABOLIC PANEL
Anion gap: 9 (ref 5–15)
BUN: 17 mg/dL (ref 6–20)
CO2: 25 mmol/L (ref 22–32)
Calcium: 8.6 mg/dL — ABNORMAL LOW (ref 8.9–10.3)
Chloride: 107 mmol/L (ref 98–111)
Creatinine, Ser: 0.58 mg/dL (ref 0.44–1.00)
GFR, Estimated: >60 mL/min (ref >60)
Glucose, Bld: 120 mg/dL — ABNORMAL HIGH (ref 70–99)
Potassium: 3.3 mmol/L — ABNORMAL LOW (ref 3.5–5.1)
Sodium: 141 mmol/L (ref 135–145)

## 2023-01-22 LAB — CK: Total CK: 20 U/L — ABNORMAL LOW (ref 38–234)

## 2023-01-22 MED ORDER — FAMOTIDINE 20 MG PO TABS
40.0000 mg | ORAL_TABLET | Freq: Two times a day (BID) | ORAL | Status: DC
  Administered 2023-01-22 – 2023-01-28 (×13): 40 mg via ORAL
  Filled 2023-01-22 (×13): qty 2

## 2023-01-22 MED ORDER — POTASSIUM CHLORIDE CRYS ER 20 MEQ PO TBCR
40.0000 meq | EXTENDED_RELEASE_TABLET | Freq: Once | ORAL | Status: AC
  Administered 2023-01-22: 40 meq via ORAL
  Filled 2023-01-22: qty 2

## 2023-01-22 MED ORDER — IBUPROFEN 400 MG PO TABS
400.0000 mg | ORAL_TABLET | Freq: Four times a day (QID) | ORAL | Status: DC
  Administered 2023-01-22 – 2023-01-28 (×23): 400 mg via ORAL
  Filled 2023-01-22 (×23): qty 1

## 2023-01-22 NOTE — Plan of Care (Signed)
  Problem: Education: Goal: Knowledge of General Education information will improve Description: Including pain rating scale, medication(s)/side effects and non-pharmacologic comfort measures Outcome: Progressing   Problem: Health Behavior/Discharge Planning: Goal: Ability to manage health-related needs will improve Outcome: Progressing   Problem: Activity: Goal: Risk for activity intolerance will decrease Outcome: Progressing   Problem: Nutrition: Goal: Adequate nutrition will be maintained Outcome: Progressing   Problem: Elimination: Goal: Will not experience complications related to bowel motility Outcome: Progressing Goal: Will not experience complications related to urinary retention Outcome: Progressing   Problem: Pain Management: Goal: General experience of comfort will improve Outcome: Progressing

## 2023-01-22 NOTE — Progress Notes (Signed)

## 2023-01-22 NOTE — Assessment & Plan Note (Addendum)
 01-22-2023 replete with oral kcl 01-24-2023 give more po kcl
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
## 2023-01-23 DIAGNOSIS — F1990 Other psychoactive substance use, unspecified, uncomplicated: Secondary | ICD-10-CM | POA: Diagnosis not present

## 2023-01-23 DIAGNOSIS — M4626 Osteomyelitis of vertebra, lumbar region: Secondary | ICD-10-CM | POA: Diagnosis not present

## 2023-01-23 DIAGNOSIS — M4646 Discitis, unspecified, lumbar region: Secondary | ICD-10-CM | POA: Diagnosis not present

## 2023-01-23 DIAGNOSIS — E43 Unspecified severe protein-calorie malnutrition: Secondary | ICD-10-CM | POA: Diagnosis not present

## 2023-01-23 MED ORDER — METHOCARBAMOL 500 MG PO TABS
500.0000 mg | ORAL_TABLET | ORAL | Status: DC | PRN
  Administered 2023-01-23 – 2023-01-28 (×18): 500 mg via ORAL
  Filled 2023-01-23 (×19): qty 1

## 2023-01-23 MED ORDER — MAGNESIUM SULFATE 2 GM/50ML IV SOLN
2.0000 g | Freq: Once | INTRAVENOUS | Status: AC
  Administered 2023-01-23: 2 g via INTRAVENOUS
  Filled 2023-01-23: qty 50
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
## 2023-01-23 NOTE — Plan of Care (Signed)
 Patient calm and cooperative and resting quietly throughout the night. She states she feels much better than she has in the past few weeks. Pt is A&O x4 and with a pleasant demeanor with this RN. Patient able to have appropriate conversations, unlike in previous shifts with oversedation and uncooperative behavior noted. VS WNL for her, she remains afebrile. IV antibiotics infusing, but pt does have c/o bilat LE muscle spasms that she associates with abx admin. She requested the 2300 and 0300 doses be delayed secondary to inability to sleep because of leg spasms in previous nights. Patient progressing well towards discharge. She states she will be glad to have abx PO vs IV. Will cont to monitor.

## 2023-01-24 ENCOUNTER — Inpatient Hospital Stay (HOSPITAL_COMMUNITY): Payer: 59

## 2023-01-24 DIAGNOSIS — F1910 Other psychoactive substance abuse, uncomplicated: Secondary | ICD-10-CM

## 2023-01-24 DIAGNOSIS — E876 Hypokalemia: Secondary | ICD-10-CM

## 2023-01-24 DIAGNOSIS — M4646 Discitis, unspecified, lumbar region: Secondary | ICD-10-CM

## 2023-01-24 DIAGNOSIS — R9431 Abnormal electrocardiogram [ECG] [EKG]: Secondary | ICD-10-CM

## 2023-01-24 DIAGNOSIS — T3795XA Adverse effect of unspecified systemic anti-infective and antiparasitic, initial encounter: Secondary | ICD-10-CM

## 2023-01-24 DIAGNOSIS — F1990 Other psychoactive substance use, unspecified, uncomplicated: Secondary | ICD-10-CM

## 2023-01-24 DIAGNOSIS — M4626 Osteomyelitis of vertebra, lumbar region: Secondary | ICD-10-CM | POA: Diagnosis not present

## 2023-01-24 DIAGNOSIS — E43 Unspecified severe protein-calorie malnutrition: Secondary | ICD-10-CM

## 2023-01-24 DIAGNOSIS — F39 Unspecified mood [affective] disorder: Secondary | ICD-10-CM | POA: Diagnosis not present

## 2023-01-24 DIAGNOSIS — A530 Latent syphilis, unspecified as early or late: Secondary | ICD-10-CM | POA: Diagnosis not present

## 2023-01-24 DIAGNOSIS — F1120 Opioid dependence, uncomplicated: Secondary | ICD-10-CM | POA: Diagnosis not present

## 2023-01-24 LAB — COMPREHENSIVE METABOLIC PANEL
ALT: 38 U/L (ref 0–44)
AST: 28 U/L (ref 15–41)
Albumin: 2.1 g/dL — ABNORMAL LOW (ref 3.5–5.0)
Alkaline Phosphatase: 101 U/L (ref 38–126)
Anion gap: 8 (ref 5–15)
BUN: 13 mg/dL (ref 6–20)
CO2: 25 mmol/L (ref 22–32)
Calcium: 8.2 mg/dL — ABNORMAL LOW (ref 8.9–10.3)
Chloride: 106 mmol/L (ref 98–111)
Creatinine, Ser: 0.65 mg/dL (ref 0.44–1.00)
GFR, Estimated: >60 mL/min (ref >60)
Glucose, Bld: 107 mg/dL — ABNORMAL HIGH (ref 70–99)
Potassium: 3.4 mmol/L — ABNORMAL LOW (ref 3.5–5.1)
Sodium: 139 mmol/L (ref 135–145)
Total Bilirubin: 0.4 mg/dL (ref 0.0–1.2)
Total Protein: 5.8 g/dL — ABNORMAL LOW (ref 6.5–8.1)

## 2023-01-24 LAB — MAGNESIUM: Magnesium: 1.7 mg/dL (ref 1.7–2.4)

## 2023-01-24 LAB — URINE CYTOLOGY ANCILLARY ONLY
Chlamydia: NEGATIVE
Comment: NEGATIVE
Comment: NEGATIVE
Comment: NORMAL
Neisseria Gonorrhea: NEGATIVE
Trichomonas: POSITIVE — AB

## 2023-01-24 MED ORDER — POTASSIUM CHLORIDE CRYS ER 20 MEQ PO TBCR
40.0000 meq | EXTENDED_RELEASE_TABLET | Freq: Once | ORAL | Status: AC
  Administered 2023-01-24: 40 meq via ORAL
  Filled 2023-01-24: qty 2

## 2023-01-24 MED ORDER — QUETIAPINE FUMARATE 50 MG PO TABS
50.0000 mg | ORAL_TABLET | Freq: Every day | ORAL | Status: DC
  Administered 2023-01-24: 50 mg via ORAL
  Filled 2023-01-24: qty 1

## 2023-01-24 MED ORDER — CEFAZOLIN SODIUM-DEXTROSE 2-4 GM/100ML-% IV SOLN
2.0000 g | Freq: Three times a day (TID) | INTRAVENOUS | Status: DC
  Administered 2023-01-24 – 2023-01-27 (×11): 2 g via INTRAVENOUS
  Filled 2023-01-24 (×12): qty 100
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
## 2023-01-24 NOTE — Consult Note (Signed)
 [facility] Psychiatric Consult Initial  Patient Name: .Summer Cain  MRN: [id number]  DOB: 1985-02-02  Consult Order details:  Dr. Laurie wants to get back on her psych meds. has been off them for several weeks/months.  Mode of Visit: In person   Psychiatry Consult Evaluation  Service Date: January 24, 2023 LOS:  LOS: 15 days  Chief Complaint  Want to get back on psych meds   Primary Psychiatric Diagnoses  1.  Bipolar disorder, depressive episode  2.  Polysubstance Use Disorder (Cocaine, Crack, Opioid, THC)   Assessment  Summer Cain is a 38 y.o. female admitted: Presented to the ED on  01/08/2023  9:11 AM for severe back pain. MRI was concerning for discitis/osteomyelitis of L5/S1 w/ intervertebral purulence.  She is currently being treated with antibiotic therapy.  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   Her current presentation is bipolar, depressive type. Reports that her mood is cycling and feels like she is more depressed currently.  Does have episodes of increased irritability, impulsivity, increased risky behaviorand anger outbursts. Patient reports noncompliance on previous medication trials and has not been on medications the past several years 2020. Patient has been trialed on multiple psychiatric medications including Depakote , Lithium, Seroqeul, Trazodone  and Remeron. Reports moderate to good effects with Seroquel .  Had mild symptom control on Remeron, trazodone , lithium and Depakote . Patient was able to maintain sobriety after placement in a rehab facility in 2023 with suboxone . The past several months she has been obtaining Suboxone  without a prescription.  Due to  being unable to get back with step-by-step program here in [facility].   Given that the patient does have some rapid speech, mood lability, crying episodes during interview we will start her on a low dose of Seroquel .  Medication has seemed to work well for mood stability in the past and will also help with better sleep.  Patient reports prior substance use of crack prior to this current hospitalization. Patient reports that she does cocaine socially and may smoke marijuana once a month. Patient had good effect on suboxone  however has not had a prescription filled per PDMP since February 17, 2022. The patient has been obtaining illicitly. Patient currently being treated for osteomyelitis and receiving oxy for back pain.  We will hold on restarting Suboxone  for the moment with hopes of decreasing frequency of oxycodone  prior to restarting Suboxone .  Please see plan below for detailed recommendations.   Diagnoses:  Active Hospital problems: Principal Problem:   Discitis - with E. 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   History of endocarditis   Normocytic anemia   Thrombocytosis   History of DVT (deep vein thrombosis)   Prolonged QT interval   Mood disorder (HCC)   Positive RPR test   Protein-calorie malnutrition, severe   Hypokalemia    Plan   ## Psychiatric Medication Recommendations:  - Restart patient on Seroquel  50 mg at bedtime, with plan to titrate in a few days if tolerating well - Will consider restarting Suboxone , patient has not been prescribed formally via PDMP since January 2024, will consider restarting after  decreasing current oxycodone  use for osteomyelitis related back pain  ## Medical Decision Making Capacity: Not specifically addressed in this encounter  ## Further Work-up:  TSH, B12, folate, While pt on Qtc prolonging medications, please monitor &  replete K+ to 4 and Mg2+ to 2, or TOC consult for substance abuse resources -- most recent EKG on 12/31 had QtC of 452 -- Pertinent labwork reviewed earlier this admission includes: Hep C positive, syphilis positive with RPR and T. Palladium antibodies, hypokalemic 3.4. No UDS available to review.   ## Disposition:-- No current psychiatric contraindications to discharge -- Plan Post Discharge/Psychiatric Care Follow-up resources with information about establishing at [facility] as a walk-in appointment for therapy and medication management   ## Behavioral / Environmental: -Utilize compassion and acknowledge the patient's experiences while setting clear and realistic expectations for care.   ## Safety and Observation Level:  - Based on my clinical evaluation, I estimate the patient to be at low risk of self harm in the current setting. - At this time, we recommend  routine. This decision is based on my review of the chart including patient's history and current presentation, interview of the patient, mental status examination, and consideration of suicide risk including evaluating suicidal ideation, plan, intent, suicidal or self-harm behaviors, risk factors, and protective factors. This judgment is based on our ability to directly address suicide risk, implement suicide prevention strategies, and develop a safety plan while the patient is in the clinical setting. Please contact our team if there is a concern that risk level has changed.  CSSR Risk Category:C-SSRS RISK CATEGORY: No Risk  Suicide Risk Assessment: Patient has following modifiable risk factors for suicide: untreated depression, under treated depression , social isolation, recklessness, medication noncompliance, and lack of access to outpatient mental health resources, which we are addressing by restarting medication to optimize mood symptoms. Patient has following non-modifiable or demographic risk factors for  suicide: psychiatric hospitalization Patient has the following protective factors against suicide: Cultural, spiritual, or religious beliefs that discourage suicide and no history of suicide attempts  Thank you for this consult request. Recommendations have been communicated to the primary team.  We will continue to follow at this time.   PATTI OLDEN, MD       History of Present Illness  Relevant Aspects of Hospital Summer Cain is a 38 y.o. female admitted: Presented to the ED on  01/08/2023  9:11 AM for severe back pain. MRI was concerning for discitis/osteomyelitis of L5/S1 w/ intervertebral purulence.  She is currently being treated with antibiotic therapy.  Course: Neurosurgery was consulted and recommended antibiotic therapy for treatment.  Patient has had pain controlled with multimodal pain control including Oxy ibuprofen  and Tylenol  and Robaxin .  Psychiatry was consulted as a request of the patient to get back on psychiatric medications.  Patient Report:  Portions of this note scribed by attending MD Dr. Harrison  Pt alert, oriented, and understands why she is in the hospital. She is a slightly challenging historian (gives all relevant information but sometimes out of order/difficult to determine timeline) but is quite pleasant and treatment-seeking.   Pt relates lifetime psych history; had several adolescent diagnoses (DMDD, intermittent explosive disorder, ADHD) before being diagnosed with bipolar disorder as a teenager. Notably sx bipolar d/o predate substance issues. Seems to have done fairly well on mood stabilizers for ?10 years before getting into drugs at 32. Did poorly for a long time, then got clean on suboxone , then went back  and forth on remeron and trazodone  (got more impulsive, irritable). Then was put on seroquel  in addition to suboxone , mirtazapine, trazodone  and did well for some time (this was in early 2020s). Kids taken away in 2020 which is the last time she was  consistently on meds.   Went to jail and was taken off of suboxone . Thinks she was put on lithium in jail at some point in 2023 (1 month in  - when she came out she did not pursue treatment - tried to get street suboxone  and relapsed. Pattern of inability to maintain sobriety when bipolar disorder untreated and relapsed. Hasn't seen a psychiatrist since the last time she was in jail.   Has been clean from fentanyl /heroin since mar 2023. Has been procuring suboxone  from the street for most o that time period, sometimes oxy/perc if she could get it (ie not clean from opioids in total). Has had some problems with cocaine uppers are clearly terrible for someone with bipolar it's either horrible for me or bad for everyone around me or both.   Thinks she would have left this hospital AMA a year ago to use and is proud of herself for staying in the hospital.   Psych ROS:  Depression: Many prior depressive episodes. Currently more depressed. Reports episodes of self isolation and tearful episodes. Anxiety:  outshadowed by manic and depressive episodes, mostly social. Manifests.  Mania (lifetime and current): pt endorses (lifetime) sx of decreased need for sleep, impuslivity, speech rapid.  Hard to figure out how many of these sx occurred in same time frame.  Psychosis: (lifetime and current): yes, when using or manic. Not outside of those periods. None this hospitalization.   Collateral information:  None today  Review of Systems  Gastrointestinal:  Negative for abdominal pain, nausea and vomiting.  Psychiatric/Behavioral:  Positive for depression and substance abuse. Negative for suicidal ideas. The patient is nervous/anxious. The patient does not have insomnia.     - psych specific ROS above  Psychiatric and Social History  Psychiatric History:  Information collected from pt, medical record  Prev Dx/Sx: Bipolar Disorder, Dissociative Identity Disorder, Polysubstance Use Disorder ( Cocaine,  amphetamines, opioid, tobacco)  Current Psych Provider: none Home Meds (current): none Previous Med Trials: several - does best on mood stabilizers and poorly on antidepressants. Mentioned seroquel , depakote , mirtazapine max 30, trazodone  ?, lithium, zyprexa. Doesn't think remeron helpful.  Max lifetime dose seroquel  200.  Therapy: not currently   Prior Psych Hospitalization: Numerous from age 62 on. Had a period of stability between ?early 47s and 30s.   Prior Self Harm: more apathy, no true attempts.  Prior Violence: denied  Family Psych History: yes, everybody, Schizophrenia, Bipolar  Disorder Family Hx suicide: unclear  Social History:  Developmental Hx: no Educational Hx: HS some college Occupational Hx: Participate in gig economy Legal Hx: mostly drug or mania related (joyrides).  Living Situation: with an ex  Spiritual Hx: yes - source of support Access to weapons/lethal means: no   Substance History Alcohol: Occasional. Does not consider this current drug of abuse - priorly did drink heavily.  Type of alcohol whatever available Last Drink prior to admission.  Number of drinks per day sounds like 1-2 not daily  History of alcohol withdrawal seizures; sounds like maybe once in setting of EtOh and Bzd use History of DT's unclear - talks around this Tobacco: 1 pack every other day before admission.  Illicit drugs: hx fentanyl , cocaine. Cocaine was ?a couple of times  a week? Before she came in. Fentanyl  mostly remote. Weed helped with pain, not daily.  Prescription drug abuse: illicit opioids Rehab hx: yes, multiple   Exam Findings  Physical Exam:  Vital Signs:  Temp:  [98.2 F (36.8 C)-98.8 F (37.1 C)] 98.6 F (37 C) (01/06 0450) Pulse Rate:  [63-84] 63 (01/06 0928) Resp:  [17-18] 17 (01/06 0928) BP: (92-105)/(56-65) 93/58 (01/06 0928) SpO2:  [98 %-100 %] 100 % (01/06 0928) Weight:  [48 kg] 48 kg (01/06 0500) Blood pressure (!) 93/58, pulse 63, temperature 98.6  F (37 C), temperature source Oral, resp. rate 17, height 5' (1.524 m), weight 48 kg, SpO2 100%. Body mass index is 20.67 kg/m.   Physical Exam HENT:     Mouth/Throat:     Dentition: Abnormal dentition. Dental caries present.     Comments: Poor dentition with multiple caries and teeth missing Neurological:     Mental Status: She is oriented to person, place, and time.  Psychiatric:        Attention and Perception: She does not perceive auditory or visual hallucinations.        Mood and Affect: Mood is depressed. Affect is labile and tearful.        Behavior: Behavior is not agitated, aggressive, hyperactive or combative. Behavior is cooperative.        Thought Content: Thought content is not paranoid or delusional. Thought content does not include homicidal or suicidal ideation.        Judgment: Judgment is not impulsive.     Comments: Speech more rapid, not pressured     Mental Status Exam: General Appearance: Disheveled  Orientation:  Full (Time, Place, and Person)  Memory:  Immediate;   Fair Remote;   Fair  Concentration:  Concentration: Fair and Attention Span: Fair  Recall:  Poor  Attention  Fair  Eye Contact:  Good  Speech:  fast, not pressured   Language:  Good  Volume:  Normal  Mood: a lot is going on   Affect:  Congruent, Depressed, Full Range, and Tearful  Thought Process:  Coherent   Thought Content:  Logical and future oriented.   Suicidal Thoughts:  No  Homicidal Thoughts:  No  Judgement:  Fair to pair   Insight:  Good   Psychomotor Activity:  Normal  Akathisia:  No  Fund of Knowledge:  Fair      Assets:  Manufacturing Systems Engineer Desire for Improvement Vocational/Educational Others:  religious affiliation   Cognition:  WNL  ADL's:  Intact  AIMS (if indicated):        Other History   These have been pulled in through the EMR, reviewed, and updated if appropriate.  Family History:  The patient's family history includes Depression in her mother;  Diabetes in her paternal grandmother; Hyperlipidemia in her father; Hypertension in her mother.  Medical History: Past Medical History:  Diagnosis Date  . Abscess of aortic root   . ADHD   . Anxiety   . Bipolar 1 disorder, manic, moderate (HCC)   . Dissociative identity disorder (HCC)   . Endocarditis of mitral valve   . Endocarditis of tricuspid valve   . IV drug user   . OCD (obsessive compulsive disorder)   . Pregnant   . Scoliosis     Surgical History: Past Surgical History:  Procedure Laterality Date  . IR LUMBAR DISC ASPIRATION W/IMG GUIDE  01/11/2023  . IR LUMBAR DISC ASPIRATION W/IMG GUIDE  01/14/2023  . IRRIGATION AND DEBRIDEMENT ELBOW  Right 09/17/2019   Procedure: IRRIGATION AND DEBRIDEMENT RIGHT  ELBOW AND RIGHT WRIST;  Surgeon: Beverley Evalene BIRCH, MD;  Location: [facility] OR;  Service: Orthopedics;  Laterality: Right;  . KIDNEY STONE SURGERY    . RADIOLOGY WITH ANESTHESIA N/A 12/10/2020   Procedure: MRI WITH ANESTHESIA- THORACIC WITH AND WITHOUT CONTRAST CERVICAL WITH AND WITHOUT CONTRAST;  Surgeon: Radiologist, Medication, MD;  Location: [facility] OR;  Service: Radiology;  Laterality: N/A;  . RADIOLOGY WITH ANESTHESIA N/A 01/14/2023   Procedure: IR WITH ANESTHESIA;  Surgeon: Radiologist, Medication, MD;  Location: [facility] OR;  Service: Radiology;  Laterality: N/A;  . TEE WITHOUT CARDIOVERSION N/A 12/10/2020   Procedure: TRANSESOPHAGEAL ECHOCARDIOGRAM (TEE);  Surgeon: Hobart Powell BRAVO, MD;  Location: [facility] ENDOSCOPY;  Service: Cardiovascular;  Laterality: N/A;     Medications:   Current Facility-Administered Medications:  .  acetaminophen  (TYLENOL ) tablet 650 mg, 650 mg, Oral, Q6H PRN, 650 mg at 01/22/23 1615 **OR** acetaminophen  (TYLENOL ) suppository 650 mg, 650 mg, Rectal, Q6H PRN, Smith, Rondell A, MD .  albuterol  (PROVENTIL ) (2.5 MG/3ML) 0.083% nebulizer solution 2.5 mg, 2.5 mg, Nebulization, Q6H PRN, Smith, Rondell A, MD .  bisacodyl  (DULCOLAX) EC tablet 5 mg, 5 mg, Oral, Daily  PRN, Chavez, Abigail, NP .  bisacodyl  (DULCOLAX) suppository 10 mg, 10 mg, Rectal, Daily PRN, Akula, Vijaya, MD .  ceFAZolin  (ANCEF ) IVPB 2g/100 mL premix, 2 g, Intravenous, Q8H, Van [place], Jomarie SAILOR, MD .  clonazePAM  (KLONOPIN ) tablet 0.5 mg, 0.5 mg, Oral, BID PRN, Laurence Locus, DO, 0.5 mg at 01/18/23 1831 .  enoxaparin  (LOVENOX ) injection 40 mg, 40 mg, Subcutaneous, Q24H, Smith, Rondell A, MD, 40 mg at 01/23/23 2100 .  famotidine  (PEPCID ) tablet 40 mg, 40 mg, Oral, BID, Laurence Locus, DO, 40 mg at 01/24/23 0934 .  feeding supplement (ENSURE ENLIVE / ENSURE PLUS) liquid 237 mL, 237 mL, Oral, TID BM, Akula, Vijaya, MD, 237 mL at 01/24/23 9062 .  gabapentin  (NEURONTIN ) capsule 200 mg, 200 mg, Oral, BID, Smith, Rondell A, MD, 200 mg at 01/24/23 0934 .  hydrOXYzine  (ATARAX ) tablet 50 mg, 50 mg, Oral, TID PRN, Akula, Vijaya, MD, 50 mg at 01/24/23 1218 .  ibuprofen  (ADVIL ) tablet 400 mg, 400 mg, Oral, QID, Laurence Locus, DO, 400 mg at 01/24/23 0935 .  methocarbamol  (ROBAXIN ) tablet 500 mg, 500 mg, Oral, Q4H PRN, Laurence Locus, DO, 500 mg at 01/24/23 1218 .  multivitamin with minerals tablet 1 tablet, 1 tablet, Oral, Daily, Viktoria Calkin, RPH, 1 tablet at 01/24/23 0935 .  nicotine  (NICODERM CQ  - dosed in mg/24 hours) patch 21 mg, 21 mg, Transdermal, Daily, Smith, Rondell A, MD, 21 mg at 01/16/23 0950 .  ondansetron  (ZOFRAN -ODT) disintegrating tablet 8 mg, 8 mg, Oral, Q8H PRN, Laurence Locus, DO, 8 mg at 01/18/23 1731 .  oxyCODONE -acetaminophen  (PERCOCET/ROXICET) 5-325 MG per tablet 1 tablet, 1 tablet, Oral, Q6H PRN, Reome, Earle J, RPH, 1 tablet at 01/24/23 0938 .  polyethylene glycol (MIRALAX  / GLYCOLAX ) packet 17 g, 17 g, Oral, BID, Akula, Vijaya, MD, 17 g at 01/20/23 2340 .  senna-docusate (Senokot-S) tablet 2 tablet, 2 tablet, Oral, BID, Akula, Vijaya, MD, 2 tablet at 01/22/23 2227 .  sodium chloride  flush (NS) 0.9 % injection 3 mL, 3 mL, Intravenous, Q12H, Smith, Rondell A, MD, 3 mL at 01/23/23  2150  Allergies: Allergies  Allergen Reactions  . Latex Swelling    PATTI OLDEN, MD PGY-1

## 2023-01-24 NOTE — Plan of Care (Signed)
 Patient resting quietly in bed. She continues to improve and progress towards D/C.  Pain and restlessness well managed with current med orders. She is tolerating IV abx, but states she believes it causes leg spasms with each dose. She accepts Robaxin  approx. prior to Abx admin as a solution to help with this c/o. She had 2 large Bms overnight via BSC. Will cont to monitor.

## 2023-01-24 NOTE — Progress Notes (Signed)
 Inpatient Rehab Admissions Coordinator:   Note therapy recommendations updated for outpatient follow up.  Will sign off for CIR.    Estill Dooms, PT, DPT Admissions Coordinator [phone] 01/24/23  4:40 PM
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
## 2023-01-24 NOTE — Progress Notes (Signed)
 Subjective:  Complaining of pain in her arm and also throughout her body with each infusion of ampicillin   Antibiotics:  Anti-infectives (From admission, onward)    Start     Dose/Rate Route Frequency Ordered Stop   01/24/23 1400  ceFAZolin  (ANCEF ) IVPB 2g/100 mL premix        2 g 200 mL/hr over 30 Minutes Intravenous Every 8 hours 01/24/23 1120     01/16/23 1800  ampicillin  (OMNIPEN) 2 g in sodium chloride  0.9 % 100 mL IVPB  Status:  Discontinued        2 g 300 mL/hr over 20 Minutes Intravenous Every 6 hours 01/16/23 1517 01/16/23 1518   01/16/23 1615  ampicillin  (OMNIPEN) 2 g in sodium chloride  0.9 % 100 mL IVPB  Status:  Discontinued        2 g 300 mL/hr over 20 Minutes Intravenous Every 6 hours 01/16/23 1518 01/16/23 1520   01/16/23 1615  ampicillin  (OMNIPEN) 2 g in sodium chloride  0.9 % 100 mL IVPB  Status:  Discontinued        2 g 300 mL/hr over 20 Minutes Intravenous Every 4 hours 01/16/23 1520 01/24/23 1120   01/15/23 1000  DAPTOmycin  (CUBICIN ) IVPB 500 mg/40mL premix  Status:  Discontinued        500 mg 100 mL/hr over 30 Minutes Intravenous Daily 01/15/23 0852 01/16/23 1517   01/14/23 1430  penicillin  g benzathine (BICILLIN  LA) 1200000 UNIT/2ML injection 2.4 Million Units        2.4 Million Units Intramuscular  Once 01/14/23 1334 01/15/23 1112   01/14/23 1430  DAPTOmycin  (CUBICIN ) IVPB 500 mg/50mL premix  Status:  Discontinued        8 mg/kg  57.6 kg 100 mL/hr over 30 Minutes Intravenous Daily 01/14/23 1342 01/15/23 0853   01/14/23 1430  ceFEPIme  (MAXIPIME ) 2 g in sodium chloride  0.9 % 100 mL IVPB  Status:  Discontinued        2 g 200 mL/hr over 30 Minutes Intravenous Every 8 hours 01/14/23 1342 01/16/23 1517   01/09/23 1800  vancomycin  (VANCOREADY) IVPB 500 mg/100 mL  Status:  Discontinued        500 mg 100 mL/hr over 60 Minutes Intravenous Every 12 hours 01/09/23 0529 01/10/23 1118   01/09/23 0600  ceFEPIme  (MAXIPIME ) 2 g in sodium chloride  0.9 % 100 mL  IVPB  Status:  Discontinued        2 g 200 mL/hr over 30 Minutes Intravenous Every 8 hours 01/09/23 0537 01/10/23 1118   01/09/23 0530  vancomycin  (VANCOREADY) IVPB 1250 mg/250 mL        1,250 mg 166.7 mL/hr over 90 Minutes Intravenous  Once 01/09/23 0520 01/09/23 0803       Medications: Scheduled Meds:  enoxaparin  (LOVENOX ) injection  40 mg Subcutaneous Q24H   famotidine   40 mg Oral BID   feeding supplement  237 mL Oral TID BM   gabapentin   200 mg Oral BID   ibuprofen   400 mg Oral QID   multivitamin with minerals  1 tablet Oral Daily   nicotine   21 mg Transdermal Daily   polyethylene glycol  17 g Oral BID   senna-docusate  2 tablet Oral BID   sodium chloride  flush  3 mL Intravenous Q12H   Continuous Infusions:   ceFAZolin  (ANCEF ) IV 2 g (01/24/23 1350)   PRN Meds:.acetaminophen  **OR** acetaminophen , albuterol , bisacodyl , bisacodyl , clonazePAM , hydrOXYzine , methocarbamol , ondansetron , oxyCODONE -acetaminophen     Objective: Weight change: -0.3 kg  No intake or output data in the 24 hours ending 01/24/23 1423 Blood pressure (!) 93/58, pulse 63, temperature 98.6 F (37 C), temperature source Oral, resp. rate 17, height 5' (1.524 m), weight 48 kg, SpO2 100%. Temp:  [98.2 F (36.8 C)-98.8 F (37.1 C)] 98.6 F (37 C) (01/06 0450) Pulse Rate:  [63-84] 63 (01/06 0928) Resp:  [17-18] 17 (01/06 0928) BP: (92-105)/(56-65) 93/58 (01/06 0928) SpO2:  [98 %-100 %] 100 % (01/06 0928) Weight:  [48 kg] 48 kg (01/06 0500)  Physical Exam: Physical Exam Constitutional:      Appearance: She is cachectic.  Eyes:     General:        Right eye: No discharge.        Left eye: No discharge.  Pulmonary:     Effort: No respiratory distress.     Breath sounds: No wheezing.  Abdominal:     General: There is no distension.  Skin:    General: Skin is warm and dry.  Neurological:     General: No focal deficit present.     Mental Status: She is alert and oriented to person, place, and time.   Psychiatric:        Mood and Affect: Mood normal.        Behavior: Behavior normal.        Thought Content: Thought content normal.        Judgment: Judgment normal.     No induration or evidence of a thrombus around the PICC line  CBC:    BMET Recent Labs    01/22/23 0301 01/24/23 0346  NA 141 139  K 3.3* 3.4*  CL 107 106  CO2 25 25  GLUCOSE 120* 107*  BUN 17 13  CREATININE 0.58 0.65  CALCIUM 8.6* 8.2*     Liver Panel  Recent Labs    01/24/23 0346  PROT 5.8*  ALBUMIN 2.1*  AST 28  ALT 38  ALKPHOS 101  BILITOT 0.4       Sedimentation Rate No results for input(s): ESRSEDRATE in the last 72 hours. C-Reactive Protein No results for input(s): CRP in the last 72 hours.  Micro Results: Recent Results (from the past 720 hours)  Blood culture (routine x 2)     Status: None   Collection Time: 01/08/23  1:14 PM   Specimen: BLOOD RIGHT ARM  Result Value Ref Range Status   Specimen Description BLOOD RIGHT ARM  Final   Special Requests   Final    BOTTLES DRAWN AEROBIC AND ANAEROBIC Blood Culture results may not be optimal due to an inadequate volume of blood received in culture bottles   Culture   Final    NO GROWTH 5 DAYS Performed at [facility], [address], [place], [place] [address]    Report Status 01/13/2023 FINAL  Final  MRSA Next Gen by PCR, Nasal     Status: None   Collection Time: 01/09/23  8:07 AM   Specimen: Nasal Mucosa; Nasal Swab  Result Value Ref Range Status   MRSA by PCR Next Gen NOT DETECTED NOT DETECTED Final    Comment: (NOTE) The GeneXpert MRSA Assay (FDA approved for NASAL specimens only), is one component of a comprehensive MRSA colonization surveillance program. It is not intended to diagnose MRSA infection nor to guide or monitor treatment for MRSA infections. Test performance is not FDA approved in patients less than 42 years old. Performed at [facility], [address],  [place], [place] [address]    Blood culture (routine x 2)     Status: None   Collection Time: 01/09/23  1:07 PM   Specimen: BLOOD RIGHT ARM  Result Value Ref Range Status   Specimen Description BLOOD RIGHT ARM  Final   Special Requests   Final    BOTTLES DRAWN AEROBIC ONLY Blood Culture results may not be optimal due to an inadequate volume of blood received in culture bottles   Culture   Final    NO GROWTH 5 DAYS Performed at [facility], [address] [place], [place] [address]    Report Status 01/14/2023 FINAL  Final  Aerobic/Anaerobic Culture w Gram Stain (surgical/deep wound)     Status: None   Collection Time: 01/14/23  9:16 AM   Specimen: Back  Result Value Ref Range Status   Specimen Description BACK  Final   Special Requests DISC ASP  Final   Gram Stain   Final    RARE WBC PRESENT, PREDOMINANTLY PMN NO ORGANISMS SEEN    Culture   Final    RARE ESCHERICHIA COLI CRITICAL RESULT CALLED TO, READ BACK BY AND VERIFIED WITH: RN CHANCEY STOUT [id number] AT 0932 BY EC NO ANAEROBES ISOLATED Performed at [facility], [address], [place], [place] [address]    Report Status 01/19/2023 FINAL  Final   Organism ID, Bacteria ESCHERICHIA COLI  Final      Susceptibility   Escherichia coli - MIC*    AMPICILLIN  <=2 SENSITIVE Sensitive     CEFEPIME  <=0.12 SENSITIVE Sensitive     CEFTAZIDIME <=1 SENSITIVE Sensitive     CEFTRIAXONE  <=0.25 SENSITIVE Sensitive     CIPROFLOXACIN  <=0.25 SENSITIVE Sensitive     GENTAMICIN <=1 SENSITIVE Sensitive     IMIPENEM <=0.25 SENSITIVE Sensitive     TRIMETH /SULFA  <=20 SENSITIVE Sensitive     AMPICILLIN /SULBACTAM <=2 SENSITIVE Sensitive     PIP/TAZO <=4 SENSITIVE Sensitive ug/mL    * RARE ESCHERICHIA COLI  Acid Fast Smear (AFB)     Status: None   Collection Time: 01/14/23  9:16 AM   Specimen: Vertebra  Result Value Ref Range Status   AFB Specimen Processing Concentration  Final   Acid Fast Smear Negative  Final    Comment: (NOTE) Performed At: [facility] [address] [place], [place] [id number] Jennette Shorter MD Ey:[id number]    Source (AFB) DISC ASP  Final    Comment: Performed at [facility], [address], [place], [place] [address]  Culture, fungus without smear     Status: None (Preliminary result)   Collection Time: 01/14/23  9:16 AM   Specimen: Back; Other  Result Value Ref Range Status   Specimen Description BACK  Final   Special Requests DISC ASP  Final   Culture   Final    NO FUNGUS ISOLATED AFTER 10 DAYS Performed at [facility], [address] [place], [place] [address]    Report Status PENDING  Incomplete  Urine Culture     Status: None   Collection Time: 01/15/23  6:11 AM   Specimen: Urine, Random  Result Value Ref Range Status   Specimen Description URINE, RANDOM  Final   Special Requests NONE Reflexed from [id number]  Final   Culture   Final    NO GROWTH Performed at [facility], [address], [place], [place] [address]    Report Status 01/16/2023 FINAL  Final    Studies/Results:  No results found.    Assessment/Plan:  INTERVAL HISTORY:  as mentioned ssx with infusion of AMP   Principal Problem:   Discitis - with E. 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  #1 Symptoms of pain, discomfort in arm and body wit infusion of antibiotic  This is not consistent with allergic reaction and seems at most to be  an intolerance to the ampicillin .  There also could be something wrong with her PICC line that needs to be adjusted.  In the interim we are switching her over to cefazolin  will assess tomorrow  Cefazolin  may also be more user friendly for inpatient rehab  #2  5 S1 discitis osteomyelitis epidural phlegmon due to E. coli:  As mentioned above we will switch to cefazolin  she is going to get a portion of her treatment parenterally followed by oral antibiotics.  It would likely be prudent to repeat an MRI of her spine to reassess the epidural phlegmon prior to discharge from the hospital  #3 history of syphilis status post successful treatment:  This is a marker for high risk for HIV since syphilis and HIV tend to track in the same populations.  I would strongly recommend that she start on HIV preexposure prophylaxis and will bring this up the next time I visit with her.  4.  Chronic hepatitis C without hepatic coma: Would be happy to treat this in the clinic.  She is genotype Ia with a viral load of 9 in in 2022 I will recheck her viral load this admission.  5 IVDU: will need plan for tackling this addiction but in a manner that meets the patient where they are rather than a puritanical one that will be highly likely to be ineffective.   LOS: 15 days   Summer Cain 01/24/2023, 2:23 PM
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
## 2023-01-25 ENCOUNTER — Telehealth (HOSPITAL_COMMUNITY): Payer: Self-pay | Admitting: Pharmacy Technician

## 2023-01-25 ENCOUNTER — Other Ambulatory Visit (HOSPITAL_COMMUNITY): Payer: Self-pay

## 2023-01-25 DIAGNOSIS — T3795XA Adverse effect of unspecified systemic anti-infective and antiparasitic, initial encounter: Secondary | ICD-10-CM

## 2023-01-25 DIAGNOSIS — F1910 Other psychoactive substance abuse, uncomplicated: Secondary | ICD-10-CM | POA: Diagnosis not present

## 2023-01-25 DIAGNOSIS — F39 Unspecified mood [affective] disorder: Secondary | ICD-10-CM | POA: Diagnosis not present

## 2023-01-25 DIAGNOSIS — Z2981 Encounter for HIV pre-exposure prophylaxis: Secondary | ICD-10-CM

## 2023-01-25 DIAGNOSIS — A599 Trichomoniasis, unspecified: Secondary | ICD-10-CM | POA: Diagnosis not present

## 2023-01-25 DIAGNOSIS — F1120 Opioid dependence, uncomplicated: Secondary | ICD-10-CM | POA: Diagnosis not present

## 2023-01-25 DIAGNOSIS — Z8619 Personal history of other infectious and parasitic diseases: Secondary | ICD-10-CM

## 2023-01-25 DIAGNOSIS — M4646 Discitis, unspecified, lumbar region: Secondary | ICD-10-CM | POA: Diagnosis not present

## 2023-01-25 DIAGNOSIS — M4626 Osteomyelitis of vertebra, lumbar region: Secondary | ICD-10-CM | POA: Diagnosis not present

## 2023-01-25 LAB — LIPID PANEL
Cholesterol: 163 mg/dL (ref 0–200)
HDL: 47 mg/dL (ref >40)
LDL Cholesterol: 82 mg/dL (ref 0–99)
Total CHOL/HDL Ratio: 3.5 {ratio}
Triglycerides: 172 mg/dL — ABNORMAL HIGH (ref <150)
VLDL: 34 mg/dL (ref 0–40)

## 2023-01-25 LAB — TSH: TSH: 1.354 u[IU]/mL (ref 0.350–4.500)

## 2023-01-25 LAB — VITAMIN B12: Vitamin B-12: 239 pg/mL (ref 180–914)

## 2023-01-25 LAB — FOLATE: Folate: 10.2 ng/mL (ref >5.9)

## 2023-01-25 MED ORDER — METRONIDAZOLE 500 MG PO TABS
500.0000 mg | ORAL_TABLET | Freq: Two times a day (BID) | ORAL | Status: DC
  Administered 2023-01-25 – 2023-01-28 (×7): 500 mg via ORAL
  Filled 2023-01-25 (×7): qty 1

## 2023-01-25 MED ORDER — GABAPENTIN 300 MG PO CAPS
600.0000 mg | ORAL_CAPSULE | Freq: Every day | ORAL | Status: DC
  Administered 2023-01-25 – 2023-01-27 (×3): 600 mg via ORAL
  Filled 2023-01-25 (×3): qty 2

## 2023-01-25 MED ORDER — QUETIAPINE FUMARATE 50 MG PO TABS
100.0000 mg | ORAL_TABLET | Freq: Every day | ORAL | Status: DC
  Administered 2023-01-25 – 2023-01-26 (×2): 100 mg via ORAL
  Filled 2023-01-25 (×2): qty 2

## 2023-01-25 MED ORDER — CLONIDINE HCL 0.1 MG PO TABS: 0.1000 mg | ORAL_TABLET | Freq: Three times a day (TID) | ORAL | Status: DC | PRN

## 2023-01-25 MED ORDER — GABAPENTIN 100 MG PO CAPS
200.0000 mg | ORAL_CAPSULE | Freq: Two times a day (BID) | ORAL | Status: DC
  Administered 2023-01-25 – 2023-01-28 (×6): 200 mg via ORAL
  Filled 2023-01-25 (×6): qty 2

## 2023-01-25 NOTE — Assessment & Plan Note (Addendum)
 01-25-2023 urine cytology shows trichomonas.  Started on flagyl per ID. 01-26-2023 continue po flagyl for a total of 7 days. 500 mg bid.

## 2023-01-25 NOTE — TOC Progression Note (Addendum)
 Transition of Care (TOC) - Progression Note   Spoke to patient at bedside, regarding updated PT /OT recommendation of OP PT/OT and rolling walker and bedside commode. Will place orders for MD to sign and call [employer] for DME.   Entered order for OP PT/OT for MD to sign   Patient in agreement.   Patient will not be returning to address on face sheet. She is going to stay with friends who can assist her at discharge at [facility] , [place], [place] [address] .   Patient does not have a PCP. Called [facility], [facility] , [facility] and [facility] they are not accepting new patients . [facility] not accepting new patients for suboxone  clini either , spoke to [facility]   NCM scheduled appointment. Information on AVS  Patient Details  Name: Summer Cain MRN: [id number] Date of Birth: 11-Nov-1985  Transition of Care [facility]) CM/SW Contact  Summer Cain, Summer Jansky, RN Phone Number: 01/25/2023, 3:12 PM  Clinical Narrative:       Expected Discharge Plan: Home/Self Care Barriers to Discharge: Continued Medical Work up  Expected Discharge Plan and Services   Discharge Planning Services: CM Consult Post Acute Care Choice: NA                   DME Arranged: N/A DME Agency: NA       HH Arranged: NA HH Agency: NA         Social Determinants of Health (SDOH) Interventions SDOH Screenings   Food Insecurity: No Food Insecurity (01/10/2023)  Recent Concern: Food Insecurity - Food Insecurity Present (01/09/2023)  Housing: High Risk (01/10/2023)  Transportation Needs: No Transportation Needs (01/09/2023)  Utilities: At Risk (01/10/2023)  Social Connections: Unknown (06/02/2021)   Received from [facility], [facility]  Tobacco Use: High Risk (01/14/2023)    Readmission Risk Interventions     No data to display

## 2023-01-25 NOTE — Plan of Care (Signed)

## 2023-01-25 NOTE — Telephone Encounter (Signed)
 Patient Product/process Development Scientist completed.    The patient is insured through [employer]. Patient has [employer], may use a copay card, and/or apply for patient assistance if available.    Ran test claim for emtricitabine -tenofovir  200-300 mg and the current 30 day co-pay is $0.00.  Ran test claim for Apretude  ER 600 mg/3 ml and Product not on formulary   This test claim was processed through [employer]- copay amounts may vary at other pharmacies due to [employer], or as the patient moves through the different stages of their insurance plan.     Reyes Sharps, CPHT Pharmacy Technician III Certified Patient Advocate [facility] Patient Advocate Team Direct Number: [phone]  Fax: [phone]
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
## 2023-01-25 NOTE — Progress Notes (Signed)
 Subjective:  She has no new complaints and did not have same symptoms with IV ancef  he did with ampicillin .    Antibiotics:  Anti-infectives (From admission, onward)    Start     Dose/Rate Route Frequency Ordered Stop   01/25/23 1015  metroNIDAZOLE  (FLAGYL ) tablet 500 mg        500 mg Oral Every 12 hours 01/25/23 0927 02/01/23 0959   01/24/23 1400  ceFAZolin  (ANCEF ) IVPB 2g/100 mL premix        2 g 200 mL/hr over 30 Minutes Intravenous Every 8 hours 01/24/23 1120     01/16/23 1800  ampicillin  (OMNIPEN) 2 g in sodium chloride  0.9 % 100 mL IVPB  Status:  Discontinued        2 g 300 mL/hr over 20 Minutes Intravenous Every 6 hours 01/16/23 1517 01/16/23 1518   01/16/23 1615  ampicillin  (OMNIPEN) 2 g in sodium chloride  0.9 % 100 mL IVPB  Status:  Discontinued        2 g 300 mL/hr over 20 Minutes Intravenous Every 6 hours 01/16/23 1518 01/16/23 1520   01/16/23 1615  ampicillin  (OMNIPEN) 2 g in sodium chloride  0.9 % 100 mL IVPB  Status:  Discontinued        2 g 300 mL/hr over 20 Minutes Intravenous Every 4 hours 01/16/23 1520 01/24/23 1120   01/15/23 1000  DAPTOmycin  (CUBICIN ) IVPB 500 mg/23mL premix  Status:  Discontinued        500 mg 100 mL/hr over 30 Minutes Intravenous Daily 01/15/23 0852 01/16/23 1517   01/14/23 1430  penicillin  g benzathine (BICILLIN  LA) 1200000 UNIT/2ML injection 2.4 Million Units        2.4 Million Units Intramuscular  Once 01/14/23 1334 01/15/23 1112   01/14/23 1430  DAPTOmycin  (CUBICIN ) IVPB 500 mg/31mL premix  Status:  Discontinued        8 mg/kg  57.6 kg 100 mL/hr over 30 Minutes Intravenous Daily 01/14/23 1342 01/15/23 0853   01/14/23 1430  ceFEPIme  (MAXIPIME ) 2 g in sodium chloride  0.9 % 100 mL IVPB  Status:  Discontinued        2 g 200 mL/hr over 30 Minutes Intravenous Every 8 hours 01/14/23 1342 01/16/23 1517   01/09/23 1800  vancomycin  (VANCOREADY) IVPB 500 mg/100 mL  Status:  Discontinued        500 mg 100 mL/hr over 60 Minutes  Intravenous Every 12 hours 01/09/23 0529 01/10/23 1118   01/09/23 0600  ceFEPIme  (MAXIPIME ) 2 g in sodium chloride  0.9 % 100 mL IVPB  Status:  Discontinued        2 g 200 mL/hr over 30 Minutes Intravenous Every 8 hours 01/09/23 0537 01/10/23 1118   01/09/23 0530  vancomycin  (VANCOREADY) IVPB 1250 mg/250 mL        1,250 mg 166.7 mL/hr over 90 Minutes Intravenous  Once 01/09/23 0520 01/09/23 0803       Medications: Scheduled Meds:  enoxaparin  (LOVENOX ) injection  40 mg Subcutaneous Q24H   famotidine   40 mg Oral BID   feeding supplement  237 mL Oral TID BM   gabapentin   200 mg Oral BID   ibuprofen   400 mg Oral QID   metroNIDAZOLE   500 mg Oral Q12H   multivitamin with minerals  1 tablet Oral Daily   nicotine   21 mg Transdermal Daily   polyethylene glycol  17 g Oral BID   QUEtiapine   50 mg Oral QHS   senna-docusate  2 tablet  Oral BID   sodium chloride  flush  3 mL Intravenous Q12H   Continuous Infusions:   ceFAZolin  (ANCEF ) IV 2 g (01/25/23 0610)   PRN Meds:.acetaminophen  **OR** acetaminophen , albuterol , bisacodyl , bisacodyl , clonazePAM , hydrOXYzine , methocarbamol , ondansetron , oxyCODONE -acetaminophen     Objective: Weight change: -0.3 kg  Intake/Output Summary (Last 24 hours) at 01/25/2023 1301 Last data filed at 01/25/2023 0834 Gross per 24 hour  Intake 523 ml  Output --  Net 523 ml   Blood pressure 120/76, pulse 61, temperature 98.5 F (36.9 C), resp. rate 17, height 5' (1.524 m), weight 47.7 kg, SpO2 98%. Temp:  [97.6 F (36.4 C)-98.5 F (36.9 C)] 98.5 F (36.9 C) (01/07 0813) Pulse Rate:  [55-76] 61 (01/07 0813) Resp:  [16-18] 17 (01/07 0813) BP: (96-120)/(59-76) 120/76 (01/07 0813) SpO2:  [98 %-100 %] 98 % (01/07 0813) Weight:  [47.7 kg] 47.7 kg (01/07 0500)  Physical Exam: Physical Exam Constitutional:      General: She is not in acute distress.    Appearance: She is well-developed. She is not diaphoretic.  HENT:     Head: Normocephalic and atraumatic.      Right Ear: External ear normal.     Left Ear: External ear normal.     Mouth/Throat:     Pharynx: No oropharyngeal exudate.  Eyes:     General: No scleral icterus.    Conjunctiva/sclera: Conjunctivae normal.     Pupils: Pupils are equal, round, and reactive to light.  Cardiovascular:     Rate and Rhythm: Normal rate and regular rhythm.     Heart sounds:     No friction rub.  Pulmonary:     Effort: Pulmonary effort is normal. No respiratory distress.     Breath sounds: Normal breath sounds. No wheezing or rales.  Abdominal:     General: Bowel sounds are normal. There is no distension.     Palpations: Abdomen is soft.     Tenderness: There is no abdominal tenderness. There is no rebound.  Musculoskeletal:        General: No tenderness. Normal range of motion.  Lymphadenopathy:     Cervical: No cervical adenopathy.  Skin:    General: Skin is warm and dry.     Coloration: Skin is not pale.     Findings: No bruising, erythema or rash.  Neurological:     General: No focal deficit present.     Mental Status: She is alert and oriented to person, place, and time.     Motor: No abnormal muscle tone.     Coordination: Coordination normal.  Psychiatric:        Mood and Affect: Mood normal.        Behavior: Behavior normal.        Thought Content: Thought content normal.        Judgment: Judgment normal.     CBC:    BMET Recent Labs    01/24/23 0346  NA 139  K 3.4*  CL 106  CO2 25  GLUCOSE 107*  BUN 13  CREATININE 0.65  CALCIUM 8.2*     Liver Panel  Recent Labs    01/24/23 0346  PROT 5.8*  ALBUMIN 2.1*  AST 28  ALT 38  ALKPHOS 101  BILITOT 0.4       Sedimentation Rate No results for input(s): ESRSEDRATE in the last 72 hours. C-Reactive Protein No results for input(s): CRP in the last 72 hours.  Micro Results: Recent Results (from the past 720 hours)  Blood culture (routine x 2)     Status: None   Collection Time: 01/08/23  1:14 PM   Specimen:  BLOOD RIGHT ARM  Result Value Ref Range Status   Specimen Description BLOOD RIGHT ARM  Final   Special Requests   Final    BOTTLES DRAWN AEROBIC AND ANAEROBIC Blood Culture results may not be optimal due to an inadequate volume of blood received in culture bottles   Culture   Final    NO GROWTH 5 DAYS Performed at [facility], [address] [place], [place] [address]    Report Status 01/13/2023 FINAL  Final  MRSA Next Gen by PCR, Nasal     Status: None   Collection Time: 01/09/23  8:07 AM   Specimen: Nasal Mucosa; Nasal Swab  Result Value Ref Range Status   MRSA by PCR Next Gen NOT DETECTED NOT DETECTED Final    Comment: (NOTE) The GeneXpert MRSA Assay (FDA approved for NASAL specimens only), is one component of a comprehensive MRSA colonization surveillance program. It is not intended to diagnose MRSA infection nor to guide or monitor treatment for MRSA infections. Test performance is not FDA approved in patients less than 25 years old. Performed at [facility], [address] [place], [place] [address]   Blood culture (routine x 2)     Status: None   Collection Time: 01/09/23  1:07 PM   Specimen: BLOOD RIGHT ARM  Result Value Ref Range Status   Specimen Description BLOOD RIGHT ARM  Final   Special Requests   Final    BOTTLES DRAWN AEROBIC ONLY Blood Culture results may not be optimal due to an inadequate volume of blood received in culture bottles   Culture   Final    NO GROWTH 5 DAYS Performed at [facility], [address], [place], [place] [address]    Report Status 01/14/2023 FINAL  Final  Aerobic/Anaerobic Culture w Gram Stain (surgical/deep wound)     Status: None   Collection Time: 01/14/23  9:16 AM   Specimen: Back  Result Value Ref Range Status   Specimen Description BACK  Final   Special Requests DISC ASP  Final   Gram Stain   Final    RARE WBC PRESENT, PREDOMINANTLY PMN NO ORGANISMS SEEN    Culture   Final    RARE ESCHERICHIA  COLI CRITICAL RESULT CALLED TO, READ BACK BY AND VERIFIED WITH: RN CHANCEY STOUT [id number] AT 0932 BY EC NO ANAEROBES ISOLATED Performed at [facility], [address], [place], [place] [address]    Report Status 01/19/2023 FINAL  Final   Organism ID, Bacteria ESCHERICHIA COLI  Final      Susceptibility   Escherichia coli - MIC*    AMPICILLIN  <=2 SENSITIVE Sensitive     CEFEPIME  <=0.12 SENSITIVE Sensitive     CEFTAZIDIME <=1 SENSITIVE Sensitive     CEFTRIAXONE  <=0.25 SENSITIVE Sensitive     CIPROFLOXACIN  <=0.25 SENSITIVE Sensitive     GENTAMICIN <=1 SENSITIVE Sensitive     IMIPENEM <=0.25 SENSITIVE Sensitive     TRIMETH /SULFA  <=20 SENSITIVE Sensitive     AMPICILLIN /SULBACTAM <=2 SENSITIVE Sensitive     PIP/TAZO <=4 SENSITIVE Sensitive ug/mL    * RARE ESCHERICHIA COLI  Acid Fast Smear (AFB)     Status: None   Collection Time: 01/14/23  9:16 AM   Specimen: Vertebra  Result Value Ref Range Status   AFB Specimen Processing Concentration  Final  Acid Fast Smear Negative  Final    Comment: (NOTE) Performed At: [facility] [address] [place], [place] [id number] Jennette Shorter MD Ey:[id number]    Source (AFB) DISC ASP  Final    Comment: Performed at [facility], [address], [place], [place] [address]  Culture, fungus without smear     Status: None (Preliminary result)   Collection Time: 01/14/23  9:16 AM   Specimen: Back; Other  Result Value Ref Range Status   Specimen Description BACK  Final   Special Requests DISC ASP  Final   Culture   Final    NO FUNGUS ISOLATED AFTER 11 DAYS Performed at [facility], [address], [place], [place] [address]    Report Status PENDING  Incomplete  Urine Culture     Status: None   Collection Time: 01/15/23  6:11 AM   Specimen: Urine, Random  Result Value Ref Range Status   Specimen Description URINE, RANDOM  Final   Special Requests NONE Reflexed from [id number]  Final   Culture   Final    NO  GROWTH Performed at [facility], [address], [place], [place] [address]    Report Status 01/16/2023 FINAL  Final    Studies/Results: DG CHEST PORT 1 VIEW Result Date: 01/24/2023 CLINICAL DATA:  Check PICC placement, initial encounter EXAM: PORTABLE CHEST 1 VIEW COMPARISON:  02/10/2022 FINDINGS: Cardiac shadows within normal limits. Lungs are clear bilaterally. No acute bony abnormality is noted aside from stable scoliosis. No central PICC is identified. A midline catheter is noted on the right in the mid upper arm. IMPRESSION: Midline catheter on the right as described. No central PICC is noted. Electronically Signed   By: Oneil Devonshire M.D.   On: 01/24/2023 18:14      Assessment/Plan:  INTERVAL HISTORY:  as mentioned ssx with infusion of AMP   Principal Problem:   Discitis - with E. 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  #1  L5-S1 discitis vertebral osteomyelitis epidural phlegmon due to E. coli:  Continue cefazolin  and then follow this up with cefadroxil  to 500 mg twice daily to complete 6 weeks of treatment with follow-up in our clinic   #2  Trichomonas: Will give her metronidazole  for this partners should be tested and treated unclear how long she is had this whether it is a new infection  #3 Syphilis: Is posttreatment but represents a  risk factor for HIV acquisition  #4 hepatitis C without hepatic coma: Plan on seeing her in clinic to treat her hepatitis C  #5 HIV preexposure prophylaxis I would like her to leave the hospital 30 days of [place]  I have talked to her about long-acting PrEP including Apretude  given every 2 months.  It is possible that she might  qualify for the extended for study which is comparing ultralong acting Apretude  given every 4 months versus every 2 months though some of this will depend on if she is active IVDU and recent STI   #6 IV drug abuse: would like to have her in suboxone  clinic with IMTS     LOS: 16 days   Jomarie Fleeta Rothman 01/25/2023, 1:01 PM

## 2023-01-25 NOTE — Progress Notes (Signed)
   Good discussion involving ID and psych. IV ABX can be converted to po abx soon. We are all worried about pt's potential for abusing IV drugs again without proper transition/treatment for her chronic opiate addiction.  Psych going to stop oxycodone  this evening/early AM. Goal is to increase some mood modifying meds to get her to tolerate opiate withdrawal and then start suboxone . With the hope that she gets into suboxone  clinic at discharge.  Pt has been doing so well the last 9 days, I would hate to waste and throw away all this great management due to patient going home and right back into abuse IV drugs because of untreated addiction.  Will coordinate with ID and Psych consults on how to properly balance conversion to PO ABX, starting suboxone  and planning for discharge in a safe manner that will give this patient the best chance of success.  Summer Door, DO [facility]

## 2023-01-25 NOTE — Progress Notes (Signed)
 Nutrition Follow-up  DOCUMENTATION CODES:   Severe malnutrition in context of chronic illness  INTERVENTION:   - Continue Regular diet and encourage PO intake  - Ensure Enlive po TID, each supplement provides 350 kcal and 20 grams of protein, pt prefers chocolate flavor - MVI with minerals and snacks daily - Encouraged pt to have consistent balanced meals, limit sweets, and have a protein at every meal - Discontinue Magic cups due to patients preference    NUTRITION DIAGNOSIS:   Severe Malnutrition related to chronic illness as evidenced by severe muscle depletion, severe fat depletion.  - still applicable   GOAL:   Patient will meet greater than or equal to 90% of their needs  - Ongoing   MONITOR:   PO intake, Weight trends, Supplement acceptance, I & O's, Labs  REASON FOR ASSESSMENT:   Malnutrition Screening Tool, Consult Assessment of nutrition requirement/status  ASSESSMENT:   38 year old female who presented to the ED on 12/21 with lower back pain. 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 Pt admitted with discitis, osteomyelitis, epidural phlegmon.  12/22 - Regular diet 12/24 - s/p attempted L5-S1 disc aspiration that was aborted due to poor visualization and pt movement   Pt having fair appetite and PO intake. Consuming 94% of her meals and drinking at least 1-2 Ensures per day. Drank 3 yesterday. Does not like the Magic cups. Had 90% of breakfast this morning. Weight is stable. She states at baseline she has always weighed around 105-110 lbs. No noted N/V. Still endorses some pain. Reports having a BM yesterday.   Pt has been skipping breakfast some days and did not order dinner yesterday, she says she was provided a frozen meal from the RN.  Pt  has been ordering a lot of sweets from the menu for her meals. Encouraged pt to be consistent in her PO intake. She states she does not usually eat breakfast until later in the day. Was willing to try snacks between meals to help with PO intake. Encouraged pt to order a protein at every meal and limit sweets and sugary foods.   Admit weight: 48 kg  Current weight: 47.7 kg    Hospital Admission Weight History: Date/Time Weight Weight in lbs BMI (Calculated)  01/25/23 0500 47.7 kg 105.16 lbs 20.54  01/24/23 0500 48 kg 105.82 lbs 20.67  01/23/23 0404 48.3 kg 106.48 lbs 20.8  01/22/23 0439 47.4 kg 104.5 lbs 20.41  01/21/23 0500 47.3 kg 104.28 lbs 20.37  01/20/23 0416 45.4 kg 100.09 lbs 19.55  01/19/23 0500 46.2 kg 101.85 lbs 19.89  01/18/23 0307 46.8 kg 103.18 lbs 20.15  01/17/23 0337 48.4 kg 106.7 lbs 20.84  01/16/23 0435 48 kg 105.82 lbs 20.67  01/15/23 0404 47.6 kg 104.94 lbs 20.49  01/14/23 0726 57.6 kg 127 lbs 24.8  01/14/23 0424 48.1 kg 106.04 lbs 20.71  01/13/23 0346 46.6 kg 102.73 lbs 20.06  01/12/23 0419 51.1 kg 112.66 lbs 22  01/11/23 0500 51.1 kg 112.66 lbs 22  01/10/23 0415 45.9 kg 101.19 lbs 19.76  01/09/23 0500 48 kg 105.82 lbs 20.67    Average Meal Intake: 1/2-1/6: 94% intake x 8 recorded meals  Nutritionally Relevant Medications: Scheduled Meds:  enoxaparin  (LOVENOX ) injection  40 mg Subcutaneous Q24H   famotidine   40 mg Oral BID   feeding supplement  237 mL  Oral TID BM   gabapentin   200 mg Oral BID   ibuprofen   400 mg Oral QID   metroNIDAZOLE   500 mg Oral Q12H   multivitamin with minerals  1 tablet Oral Daily   nicotine   21 mg Transdermal Daily   polyethylene glycol  17 g Oral BID   QUEtiapine   50 mg Oral QHS   senna-docusate  2 tablet Oral BID   sodium chloride  flush  3 mL Intravenous Q12H   Continuous Infusions:   ceFAZolin  (ANCEF ) IV 2 g (01/25/23 1437)    Labs Reviewed: 1/6: Potassium 3.4, Calcium 8.2,   NUTRITION - FOCUSED PHYSICAL EXAM: Completed  12/31  Flowsheet Row Most Recent Value  Orbital Region Unable to assess  Upper Arm Region Severe depletion  Thoracic and Lumbar Region Unable to assess  Buccal Region Severe depletion  Temple Region Severe depletion  Clavicle Bone Region Severe depletion  Clavicle and Acromion Bone Region Severe depletion  Scapular Bone Region Unable to assess  Dorsal Hand Severe depletion  Patellar Region Severe depletion  Anterior Thigh Region Severe depletion  Posterior Calf Region Severe depletion  Edema (RD Assessment) None  Hair Unable to assess  Eyes Unable to assess  Mouth Unable to assess  Skin Reviewed  Nails Reviewed       Diet Order:   Diet Order             Diet regular Room service appropriate? Yes; Fluid consistency: Thin  Diet effective now                   EDUCATION NEEDS:   Not appropriate for education at this time  Skin:  Skin Assessment: Skin Integrity Issues: Skin Integrity Issues:: Incisions Incisions: closed, lower back  Last BM:  1/6 - per patient  Height:   Ht Readings from Last 1 Encounters:  01/14/23 5' (1.524 m)    Weight:   Wt Readings from Last 1 Encounters:  01/25/23 47.7 kg    Ideal Body Weight:  45.45 kg  BMI:  Body mass index is 20.54 kg/m.  Estimated Nutritional Needs:   Kcal:  1800-2000  Protein:  80-95 grams  Fluid:  >1.8 L  Olivia Kenning, RD Registered Dietitian  See Amion for more information
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
## 2023-01-25 NOTE — Assessment & Plan Note (Signed)
 01/25/2023 pt c/o of muscle cramping and leg cramping with infusions of IV ampicillin. Change to IV ancef on 01-24-2023 with resolution of her leg/muscle cramping.

## 2023-01-25 NOTE — Consult Note (Addendum)
 Brief Psychiatry Consult Note  Received late afternoon message that pt will go to oral abx from IV sooner than expected - had planned to broach suboxone  tomorrow. Went and saw pt briefly; she was alert, oriented and denied SI/HI/AH/VH. Explained situation - pt agreed to hard stop of PRN opioids at midnight tonight and requested increased non-opioid pain mgmt for same. Will see her in AM and depending on how she looks start either 4 or 8 of suboxone . Discussed with CM/SW need for f/u outpt appt, but would like to start induction at a reasonable time point for it to be completed prior to pt discharging. Asked again about illicit fentanyl  use (pt has snuck opioids into hospital in past) in setting of suboxone  start/precipitated w/d tomorrow - pt expressed understanding and denied.   -- STOP opioids at 12:01 AM 1/8 -- INC quetiapine  to 100 mg at bedtime -- START clonidine  PRN systolic BP >140 in setting of opioid w/d (offered taper; pt stated sensitive/bottoms out easily - shared decision making led to PRN) -- was going to start flexeril , but pt on robaxin , so will hold off on this.  99231/low complexity - will see tomorrow in AM at which point she will hopefully be in mild to moderate opioid withdrawal for next steps. Messaged IM - if he sees her before we see her and she is physically in withdrawal OK to order 4-2 suboxone /naloxone  and we will assess.   Tylynn Braniff A Ewel Lona

## 2023-01-26 DIAGNOSIS — M4646 Discitis, unspecified, lumbar region: Secondary | ICD-10-CM | POA: Diagnosis not present

## 2023-01-26 DIAGNOSIS — F39 Unspecified mood [affective] disorder: Secondary | ICD-10-CM

## 2023-01-26 DIAGNOSIS — M4626 Osteomyelitis of vertebra, lumbar region: Secondary | ICD-10-CM | POA: Diagnosis not present

## 2023-01-26 DIAGNOSIS — F1120 Opioid dependence, uncomplicated: Secondary | ICD-10-CM

## 2023-01-26 DIAGNOSIS — A599 Trichomoniasis, unspecified: Secondary | ICD-10-CM | POA: Diagnosis not present

## 2023-01-26 LAB — HEMOGLOBIN A1C
Hgb A1c MFr Bld: 5.2 % (ref 4.8–5.6)
Mean Plasma Glucose: 103 mg/dL

## 2023-01-26 MED ORDER — BUPRENORPHINE HCL-NALOXONE HCL 8-2 MG SL SUBL
1.0000 | SUBLINGUAL_TABLET | Freq: Once | SUBLINGUAL | Status: AC | PRN
  Administered 2023-01-26: 1 via SUBLINGUAL
  Filled 2023-01-26: qty 1

## 2023-01-26 MED ORDER — BUPRENORPHINE HCL-NALOXONE HCL 2-0.5 MG SL SUBL: 2.0000 | SUBLINGUAL_TABLET | Freq: Once | SUBLINGUAL | Status: DC | PRN

## 2023-01-26 MED ORDER — ACETAMINOPHEN 325 MG PO TABS
650.0000 mg | ORAL_TABLET | Freq: Four times a day (QID) | ORAL | Status: DC | PRN
  Filled 2023-01-26: qty 2

## 2023-01-26 MED ORDER — BUPRENORPHINE HCL-NALOXONE HCL 2-0.5 MG SL SUBL
2.0000 | SUBLINGUAL_TABLET | Freq: Every day | SUBLINGUAL | Status: AC
Start: 2023-01-26 — End: 2023-01-26
  Administered 2023-01-26: 2 via SUBLINGUAL
  Filled 2023-01-26 (×2): qty 2

## 2023-01-26 MED ORDER — ACETAMINOPHEN 500 MG PO TABS
1000.0000 mg | ORAL_TABLET | Freq: Four times a day (QID) | ORAL | Status: AC
  Administered 2023-01-26 – 2023-01-27 (×4): 1000 mg via ORAL
  Filled 2023-01-26 (×4): qty 2

## 2023-01-26 MED ORDER — BUPRENORPHINE HCL-NALOXONE HCL 8-2 MG SL SUBL
1.0000 | SUBLINGUAL_TABLET | Freq: Two times a day (BID) | SUBLINGUAL | Status: DC
  Administered 2023-01-27 – 2023-01-28 (×3): 1 via SUBLINGUAL
  Filled 2023-01-26 (×3): qty 1

## 2023-01-26 MED ORDER — ACETAMINOPHEN 650 MG RE SUPP: 650.0000 mg | Freq: Four times a day (QID) | RECTAL | Status: DC | PRN

## 2023-01-26 NOTE — Progress Notes (Signed)
 PT Cancellation Note  [name] MRN: [id number] DOB: November 25, 1985   Cancelled Treatment:    Reason Eval/Treat Not Completed: Other (comment) Pt politely declining PT until receiving Suboxone .   Aleck Daring, PT, DPT Acute Rehabilitation Services Office [phone]'   [name] 01/26/2023, 4:05 PM

## 2023-01-26 NOTE — Consult Note (Signed)
 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 has been off them for several weeks/months.  Mode of Visit: In person   Psychiatry Consult Evaluation  Service Date: January 26, 2023 LOS:  LOS: 17 days  Chief Complaint  Want to get back on psych meds   Primary Psychiatric Diagnoses  1.  Bipolar disorder, depressive episode  2.  Polysubstance Use Disorder (Cocaine, Crack, Opioid, THC)   Assessment  Jonay Hitchcock is a 38 y.o. female admitted: Presented to the ED on  01/08/2023  9:11 AM for severe back pain. MRI was concerning for discitis/osteomyelitis of L5/S1 w/ intervertebral purulence.  She is currently being treated with antibiotic therapy.  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   Her current presentation is bipolar, depressive type. Reports that her mood is cycling and feels like she is more depressed currently.  Does have episodes of increased irritability, impulsivity, increased risky behaviorand anger outbursts. Patient reports noncompliance on previous medication trials and has not been on medications the past several years 2020. Patient has been trialed on multiple psychiatric medications including Depakote , Lithium, Seroqeul, Trazodone  and Remeron. Reports moderate to good effects with Seroquel .  Had mild symptom control on Remeron, trazodone , lithium and Depakote . Patient was able to maintain sobriety after placement in a rehab facility in 2023 with suboxone . The past several months she has been obtaining Suboxone  without a prescription.  Due to  being unable to get back with step-by-step program here in [facility].   On the morning assessment, patient was was lying down in her bed with covers over her face. Patient reports poor sleep last night due to back pain and mild withdrawal symptoms.  Patient reports experiencing the following symptoms of restlessness, muscle ache, runny nose/sniffling, increased irritability and fatigue. COWS Assessment scored at 10.  Patient last dose of Oxy was yesterday evening and she would like to wait until withdrawal symptoms become moderate start Suboxone .  She was ordered for Suboxone  8 once as needed.  And was instructed to inform nursing staff.  Based on withdrawal symptoms can start a scheduled dose to help with symptom management.  Appreciate social work's assistance with Suboxone  clinic resources for this patient's care.   Please see plan below for detailed recommendations.   Diagnoses:  Active Hospital problems: Principal Problem:   Discitis - with E. 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   History of endocarditis   Normocytic anemia   Thrombocytosis   History of DVT (deep vein thrombosis)   Prolonged QT interval   Mood disorder (HCC)   Positive RPR test   Protein-calorie malnutrition, severe   Hypokalemia   Adverse reaction to anti-infectives   Trichomonas infection   History of syphilis   Encounter for HIV pre-exposure prophylaxis    Plan   ## Psychiatric Medication Recommendations:  - Increased Seroquel  to 100 mg on 1/7 for moods stability, irritability and insomnia - Suboxone  8-2 ordered once  as needed to help with withdrawal symptoms, patient afraid to precipitate herself into withdrawal and will attempt to hold off until symptoms worsen - Will schedule dose for tomorrow  ## Medical Decision Making Capacity: Not specifically  addressed in this encounter  ## Further Work-up:  TSH, B12, folate, While pt on Qtc prolonging medications, please monitor & replete K+ to 4 and Mg2+ to 2, or TOC consult for substance abuse resources -- most recent EKG on 12/31 had QtC of 452 -- Pertinent labwork reviewed earlier this admission includes: Hep C positive, syphilis positive with RPR and T. Palladium antibodies, hypokalemic 3.4. No UDS available to review.   ## Disposition:-- No current psychiatric contraindications to discharge -- Plan Post Discharge/Psychiatric Care Follow-up resources with information about establishing at [facility] as a walk-in appointment for therapy and medication management   ## Behavioral / Environmental: -Utilize compassion and acknowledge the patient's experiences while setting clear and realistic expectations for care.   ## Safety and Observation Level:  - Based on my clinical evaluation, I estimate the patient to be at low risk of self harm in the current setting. - At this time, we recommend  routine. This decision is based on my review of the chart including patient's history and current presentation, interview of the patient, mental status examination, and consideration of suicide risk including evaluating suicidal ideation, plan, intent, suicidal or self-harm behaviors, risk factors, and protective factors. This judgment is based on our ability to directly address suicide risk, implement suicide prevention strategies, and develop a safety plan while the patient is in the clinical setting. Please contact our team if there is a concern that risk level has changed.  CSSR Risk Category:C-SSRS RISK CATEGORY: No Risk  Suicide Risk Assessment: Patient has following modifiable risk factors for suicide: untreated depression, under treated depression , social isolation, recklessness, medication noncompliance, and lack of access to outpatient mental health resources, which we are  addressing by restarting medication to optimize mood symptoms. Patient has following non-modifiable or demographic risk factors for suicide: psychiatric hospitalization Patient has the following protective factors against suicide: Cultural, spiritual, or religious beliefs that discourage suicide and no history of suicide attempts  Thank you for this consult request. Recommendations have been communicated to the primary team.  We will continue to follow at this time.   PATTI OLDEN, MD       History of Present Illness  Relevant Aspects of Hospital Lesly Oxley is a 38 y.o. female admitted: Presented to the ED on  01/08/2023  9:11 AM for severe back pain. MRI was concerning for discitis/osteomyelitis of L5/S1 w/ intervertebral purulence.  She is currently being treated with antibiotic therapy.  Course: Neurosurgery was consulted and recommended antibiotic therapy for treatment.  Patient has had pain controlled with multimodal pain control including Oxy ibuprofen  and Tylenol  and Robaxin .  Psychiatry was consulted as a request of the patient to get back on psychiatric medications.  Patient Report:   On the morning assessment, patient was was lying down in her bed with covers over her face.  Patient reports poor sleep last night due to back pain and mild withdrawal symptoms.  Patient reports experiencing the following symptoms of restlessness, muscle ache, runny nose/sniffling, increased irritability and fatigue.  Patient last dose of Oxy was yesterday evening and she would like to wait until withdrawal symptoms become moderate start Suboxone .  Patient denies SI, HI, AVH.  Psych ROS:  Depression: Many prior depressive episodes. Currently more depressed.  Reports episodes of self isolation and tearful episodes. Anxiety:  outshadowed by manic and depressive episodes, mostly social. Manifests.  Mania (lifetime and current): pt endorses (lifetime) sx of decreased need for sleep, impuslivity, speech  rapid.  Hard to figure out how many of these sx occurred in same time frame.  Psychosis: (lifetime and current): yes, when using or manic. Not outside of those periods. None this hospitalization.   Collateral information:  None today  Review of Systems  Gastrointestinal:  Negative for abdominal pain, nausea and vomiting.  Psychiatric/Behavioral:  Negative for depression, hallucinations, substance abuse and suicidal ideas. The patient is nervous/anxious and has insomnia.     - psych specific ROS above  Psychiatric and Social History  Psychiatric History:  Information collected from pt, medical record  Prev Dx/Sx: Bipolar Disorder, Dissociative Identity Disorder, Polysubstance Use Disorder ( Cocaine, amphetamines, opioid, tobacco)  Current Psych Provider: none Home Meds (current): none Previous Med Trials: several - does best on mood stabilizers and poorly on antidepressants. Mentioned seroquel , depakote , mirtazapine max 30, trazodone  ?, lithium, zyprexa. Doesn't think remeron helpful.  Max lifetime dose seroquel  200.  Therapy: not currently   Prior Psych Hospitalization: Numerous from age 4 on. Had a period of stability between ?early 49s and 30s.   Prior Self Harm: more apathy, no true attempts.  Prior Violence: denied  Family Psych History: yes, everybody, Schizophrenia, Bipolar  Disorder Family Hx suicide: unclear  Social History:  Developmental Hx: no Educational Hx: HS some college Occupational Hx: Participate in gig economy Legal Hx: mostly drug or mania related (joyrides).  Living Situation: with an ex  Spiritual Hx: yes - source of support Access to weapons/lethal means: no   Substance History Alcohol: Occasional. Does not consider this current drug of abuse - priorly did drink heavily.  Type of alcohol whatever available Last Drink prior to admission.  Number of drinks per day sounds like 1-2 not daily  History of alcohol withdrawal seizures; sounds like maybe  once in setting of EtOh and Bzd use History of DT's unclear - talks around this Tobacco: 1 pack every other day before admission.  Illicit drugs: hx fentanyl , cocaine. Cocaine was ?a couple of times a week? Before she came in. Fentanyl  mostly remote. Weed helped with pain, not daily.  Prescription drug abuse: illicit opioids Rehab hx: yes, multiple   Exam Findings  Physical Exam:  Vital Signs:  Temp:  [97.6 F (36.4 C)-97.7 F (36.5 C)] 97.6 F (36.4 C) (01/08 1655) Pulse Rate:  [60-103] 103 (01/08 1655) Resp:  [16-18] 18 (01/08 1655) BP: (93-109)/(56-77) 108/77 (01/08 1655) SpO2:  [96 %-98 %] 98 % (01/08 1655) Weight:  [47.9 kg] 47.9 kg (01/08 0500) Blood pressure 108/77, pulse (!) 103, temperature 97.6 F (36.4 C), temperature source Oral, resp. rate 18, height 5' (1.524 m), weight 47.9 kg, SpO2 98%. Body mass index is 20.62 kg/m.   Physical Exam HENT:     Mouth/Throat:     Dentition: Abnormal dentition. Dental caries present.     Comments: Poor dentition with multiple caries and teeth missing Neurological:     Mental Status: She is oriented to person, place, and time.  Psychiatric:        Attention and Perception: She does not perceive auditory or visual hallucinations.        Mood and Affect: Mood is anxious. Mood is not depressed. Affect is not labile or tearful.        Behavior: Behavior is not agitated, aggressive, hyperactive  or combative. Behavior is cooperative.        Thought Content: Thought content is not paranoid or delusional. Thought content does not include homicidal or suicidal ideation.        Judgment: Judgment is not impulsive.     Comments: Increased irritability    Mental Status Exam: General Appearance: Disheveled, laying in bed with covers over her face  Orientation:  Full (Time, Place, and Person)  Memory:  Immediate;   Fair Remote;   Fair  Concentration:  Concentration: Fair and Attention Span: Fair  Recall:  Poor  Attention  Fair  Eye  Contact:  Good  Speech:  f normal rate clear and coherent  Language:  Good  Volume:  Normal  Mood: More irritable   Affect:  Congruent, intermittently irritable,  Thought Process:  Coherent   Thought Content:  Logical and future oriented.   Suicidal Thoughts:  No  Homicidal Thoughts:  No  Judgement:  Fair to pair   Insight:  Good   Psychomotor Activity: Restless throughout interview, constantly fidgeting throughout exam, moving fingers and legs  Akathisia:  No  Fund of Knowledge:  Fair      Assets:  Manufacturing Systems Engineer Desire for Improvement Vocational/Educational Others:  religious affiliation   Cognition:  WNL  ADL's:  Intact  AIMS (if indicated):        Other History   These have been pulled in through the EMR, reviewed, and updated if appropriate.  Family History:  The patient's family history includes Depression in her mother; Diabetes in her paternal grandmother; Hyperlipidemia in her father; Hypertension in her mother.  Medical History: Past Medical History:  Diagnosis Date  . Abscess of aortic root   . ADHD   . Anxiety   . Bipolar 1 disorder, manic, moderate (HCC)   . Dissociative identity disorder (HCC)   . Endocarditis of mitral valve   . Endocarditis of tricuspid valve   . IV drug user   . OCD (obsessive compulsive disorder)   . Pregnant   . Scoliosis     Surgical History: Past Surgical History:  Procedure Laterality Date  . IR LUMBAR DISC ASPIRATION W/IMG GUIDE  01/11/2023  . IR LUMBAR DISC ASPIRATION W/IMG GUIDE  01/14/2023  . IRRIGATION AND DEBRIDEMENT ELBOW Right 09/17/2019   Procedure: IRRIGATION AND DEBRIDEMENT RIGHT  ELBOW AND RIGHT WRIST;  Surgeon: Beverley Evalene BIRCH, MD;  Location: [facility] OR;  Service: Orthopedics;  Laterality: Right;  . KIDNEY STONE SURGERY    . RADIOLOGY WITH ANESTHESIA N/A 12/10/2020   Procedure: MRI WITH ANESTHESIA- THORACIC WITH AND WITHOUT CONTRAST CERVICAL WITH AND WITHOUT CONTRAST;  Surgeon: Radiologist, Medication, MD;   Location: [facility] OR;  Service: Radiology;  Laterality: N/A;  . RADIOLOGY WITH ANESTHESIA N/A 01/14/2023   Procedure: IR WITH ANESTHESIA;  Surgeon: Radiologist, Medication, MD;  Location: [facility] OR;  Service: Radiology;  Laterality: N/A;  . TEE WITHOUT CARDIOVERSION N/A 12/10/2020   Procedure: TRANSESOPHAGEAL ECHOCARDIOGRAM (TEE);  Surgeon: Hobart Powell BRAVO, MD;  Location: [facility] ENDOSCOPY;  Service: Cardiovascular;  Laterality: N/A;     Medications:   Current Facility-Administered Medications:  .  [START ON 01/27/2023] acetaminophen  (TYLENOL ) tablet 650 mg, 650 mg, Oral, Q6H PRN **OR** [START ON 01/27/2023] acetaminophen  (TYLENOL ) suppository 650 mg, 650 mg, Rectal, Q6H PRN, Reome, Earle J, RPH .  acetaminophen  (TYLENOL ) tablet 1,000 mg, 1,000 mg, Oral, Q6H, Laurence Locus, DO, 1,000 mg at 01/26/23 1827 .  albuterol  (PROVENTIL ) (2.5 MG/3ML) 0.083% nebulizer solution 2.5 mg, 2.5 mg, Nebulization,  Q6H PRN, Claudene Maximino LABOR, MD .  bisacodyl  (DULCOLAX) EC tablet 5 mg, 5 mg, Oral, Daily PRN, Chavez, Abigail, NP .  bisacodyl  (DULCOLAX) suppository 10 mg, 10 mg, Rectal, Daily PRN, Akula, Vijaya, MD .  ceFAZolin  (ANCEF ) IVPB 2g/100 mL premix, 2 g, Intravenous, Q8H, Fleeta Rothman, Jomarie SAILOR, MD, Last Rate: 200 mL/hr at 01/26/23 1410, 2 g at 01/26/23 1410 .  clonazePAM  (KLONOPIN ) tablet 0.5 mg, 0.5 mg, Oral, BID PRN, Laurence Locus, DO, 0.5 mg at 01/26/23 0935 .  cloNIDine  (CATAPRES ) tablet 0.1 mg, 0.1 mg, Oral, TID PRN, Cinderella, Margaret A .  enoxaparin  (LOVENOX ) injection 40 mg, 40 mg, Subcutaneous, Q24H, Smith, Rondell A, MD, 40 mg at 01/25/23 2113 .  famotidine  (PEPCID ) tablet 40 mg, 40 mg, Oral, BID, Laurence Locus, DO, 40 mg at 01/26/23 9070 .  feeding supplement (ENSURE ENLIVE / ENSURE PLUS) liquid 237 mL, 237 mL, Oral, TID BM, Akula, Vijaya, MD, 237 mL at 01/26/23 1413 .  gabapentin  (NEURONTIN ) capsule 200 mg, 200 mg, Oral, BID, Laurence Locus, DO, 200 mg at 01/26/23 1521 .  gabapentin  (NEURONTIN ) capsule 600 mg, 600 mg,  Oral, QHS, Laurence Locus, DO, 600 mg at 01/25/23 2112 .  hydrOXYzine  (ATARAX ) tablet 50 mg, 50 mg, Oral, TID PRN, Akula, Vijaya, MD, 50 mg at 01/25/23 0606 .  ibuprofen  (ADVIL ) tablet 400 mg, 400 mg, Oral, QID, Laurence Locus, DO, 400 mg at 01/26/23 8171 .  methocarbamol  (ROBAXIN ) tablet 500 mg, 500 mg, Oral, Q4H PRN, Laurence Locus, DO, 500 mg at 01/26/23 1621 .  metroNIDAZOLE  (FLAGYL ) tablet 500 mg, 500 mg, Oral, Q12H, Fleeta Rothman, Jomarie SAILOR, MD, 500 mg at 01/26/23 9070 .  multivitamin with minerals tablet 1 tablet, 1 tablet, Oral, Daily, Tucker, Mary-Ashlyn, RPH, 1 tablet at 01/26/23 9070 .  ondansetron  (ZOFRAN -ODT) disintegrating tablet 8 mg, 8 mg, Oral, Q8H PRN, Laurence Locus, DO, 8 mg at 01/18/23 1731 .  polyethylene glycol (MIRALAX  / GLYCOLAX ) packet 17 g, 17 g, Oral, BID, Akula, Vijaya, MD, 17 g at 01/20/23 2340 .  QUEtiapine  (SEROQUEL ) tablet 100 mg, 100 mg, Oral, QHS, Cinderella, Margaret A, 100 mg at 01/25/23 2112 .  senna-docusate (Senokot-S) tablet 2 tablet, 2 tablet, Oral, BID, Akula, Vijaya, MD, 2 tablet at 01/22/23 2227 .  sodium chloride  flush (NS) 0.9 % injection 3 mL, 3 mL, Intravenous, Q12H, Smith, Rondell A, MD, 3 mL at 01/26/23 1000  Allergies: Allergies  Allergen Reactions  . Latex Swelling    PATTI OLDEN, MD PGY-1

## 2023-01-26 NOTE — Social Work (Signed)
 CSW advised by medical team, pt will DC out with Suboxone  and will need to follow up to be treated at a clinic after discharge. CSW met with pt at bedside and provided options for Suboxone  treatment centers and explained the process. Pt is familiar with process and agreeable. Suboxone  clinics also attached to AVS. CSW to update medical team. TOC will be available for further needs.
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
## 2023-01-26 NOTE — Discharge Instructions (Addendum)
 Intensive Outpatient Programs  [facility]    [facility] [address]     [address] [place],  [place]     [place], [place] [phone]      [phone]  [facility]   [facility]  (Inpatient and outpatient)  [phone] (Suboxone  and Methadone ) [address]           [phone]           [facility]: Alcohol [facility] Drug Services    [facility] - Intensive Outpatient [address]     [address] [place], [place] [address]     [place], [place]  [phone]      [phone]  [facility] (Outpatient, Inpatient, Chemical  [facility] (Groups and Residental) (insurance only) [phone]    [place], [place]          [phone] [email or web address] [facility] [address] [place], [place], [place] [address]  2.7 mi [phone]       [facility]    [facility] (for caregivers and family) [address]     [address], [place]     [place], [place] [phone]      [phone]  Residential Treatment Programs  [facility]  [facility](2 years) Residential: 90 days)  [facility]      [address] [place], [place]     [place], [place] [phone]      [phone] or [phone]  [facility]    [facility] [address]      [address] [place], [place]     [place], [place] [phone]      [phone]  [facility]   [facility] ([facility]) [address]     [address] [place], [place] [address]     [place], [place] [phone]      [phone] Admissions: 8am-3pm M-F  [facility]: Residential Program [phone] Days)              [facility]: [facility]  [place], [place]     [place], [place]  [phone] or  [phone]    (Walk in Hours over the weekend or by referral)   Mobile Crisis: [facility]:[phone] (for crisis response 24 hours a day)      EMERGENCY: 988  OPIOID DETOX (and treatment)  See below for opioid-specific detox and treatment programs, and note that area hospitals and emergency departments have LIMITED opioid-detox availability, often only for medical complications. For pregnant patients, only [facility] and [facility].  [facility]--Call [phone] for initial phone assessment, accept most insurance; Medicaid for treatment only(not detox, but will refer Medicaid patients out for detox), showdirectories.co.za for information. [address] [place], [place] [address]  [facility] - (Ask your medical provider for a referral, they will not accept self-referrals, they do have program for pregnant patients), located in [place], [place]  [facility] [place]- Call [phone] accepts both Medicaid and the uninsured www.[email or web address]   [facility] - For detox, come in to emergency department at [address], [place], [place] [address]. You will need to be medically cleared first. Please bring all of  your medications with you. They do accept pregnant patients for opioid detox.   [facility]- Call [phone] for more information about detox program and initial phone assessment, they accept Medicaid and uninsured only, no pregnancy detox ------------------------------------------------------  MEDICAID patients: Call [facility] at [phone] or [email or web address] , [address] [place], [place] [address] to find providers who accept Medicaid   Other insurance: Freight forwarder company (number often on back of insurance card) to find out about providers in your area who take your insurance ------------------------------------------------------  HARM REDUCTION and PREVENTION:    [facility]:  [place], [place], [place] locations  *All locations, call [phone]  [address] [place], [place] 8-2 Mondays and Tuesdays 4pm-8pm Thursdays 1pm-8pm Fridays 4pm-6pm Sundays  [facility]: [address] Wed 2-5, Thurs 3-8 [phone] [email or web address]  *All Services are Free Actor, Fentanyl  test kits, access to Narcan  and training, access to suboxone  and methadone , case management, referrals, nurse counseling groups)  Naloxone - See [facility] below, to obtain Naloxone  (for overdose reversals)   [place]  AIDS training and Education Center([employer])  besttheory.uy is the state-wide HIV/STI/hepatitis C training center for health professionals and advocates in the state of [place] . Professionals interested in training on diseases related to the opioid epidemic should contact the training center.   [facility]/Project Rosalynn fire [place] [phone] or [email or web address] or [email or web address]  [address], [place], [place] [address] Prevention, NOT treatment *Check Partnership for [facility] [place] or Project Lazarus of [place] on facebook  Mother-to-Baby [place]: Medications and More during Pregnancy and Breastfeeding- Questions about medications and substances during pregnancy and while breastfeeding, call [phone] or text questions to [phone] https://mothertobaby.org  METHADONE , SUBOXONE   [facility] [employer] ([facility] [phone] or www.adsyes.com [address], [place], [place] [address] * Methadone  Program: call [phone], extension [phone], Talk to [facility] for an appointment * Non-opioid walk-in on Mondays, Tuesdays, Fridays, 1:30-3:00pm, or call to make an appointment  [facility] [phone]  [address] [place], [place] * Methadone , $14 per day/ Suboxone , $125 per week self-pay (do take [facility]) *  First visit walk-in between 5:30am-8:30am, Monday through Friday, with picture ID and Medicaid card(if you have one)  [facility] [phone] Www.[email or web address].com  [employer], Medicaid, Medicare, some uninsured  [employer] [phone]  [address], [place], [place] [address]  *Medicaid accepted  [facility] [phone] to set up appointment [phone] or www.newseason.com [phone]) [address], [place], [place] [address] * Methadone , $14 per day/ Buprenorphine , $19 per day/ Suboxone , $24 per day *Bring photo ID, must have opiates in system and at least one year of usage prior to admittance to program  [facility] [phone] (Ask to speak to Rumalda Jansky) or www.[facility]adbehavioralresources.com  [address], [place], [place] [address] *Accepts most insurance and Medicaid *Locations also in [place], [place], [place] *Some Suboxone  treatment  [facility] [phone]  [address], [place], [place] [address] *Call ahead for Suboxone  appointment  TREATMENT FACILITIES (No methadone  or suboxone ) [facility] [phone] or www.caringservices.org [address] [place], [place] [address] *Naloxone  Distribution  *Outpatient and Transitional Housing  [facility] [phone] [address] [place], [place], [place] [address] *Must detox first (see detox locations above) *Requirements: Be uninsured OR have Medicaid, be a [employer] resident, and stay a minimum 30 days for inpatient treatment  [facility]  [phone] Www.[email or web address].com  [facility] [phone] or www.fellowshiphall.com  [address] [place], [place] [address] *Detox, residential, partial hospitalization, intensive outpatient(day/evening), extended treatment, transitional housing, sober living *Call for winn[employer]dixie, no [employer] [phone] or www.[email or web address] [address], [place], [place] [address] *Call [phone] after hours, or for information about locations in [place], [place], [place], and [place], [place] *No Medicaid or [employer], limited scholarships available  * NOT a 12-step program  [employer]  A supportive community for homeless women in recovery from substance abuse (336) [phone] or email at maryshousegso@aol .com for more information www.maryshousegso.org   [facility] [phone] or www.[facility].com  [address] [place], [place], [place], [address] Niels LITTIE Cherry, MD and Almeda Coach, NP *detox up to 3 months, most insurance and Medicaid  [facility] [phone] or www.roominn.org  *Comprehensive program helping homeless, single, pregnant women (with or without previous children), during pregnancy and after birth  *Must be a [place] resident, at least [redacted] weeks pregnant, with no severe psychiatric history   INDIVIDUAL TREATMENT PROVIDERS Dr. Gaither Moloney Book, [address] [place], [place] [address], [phone] Dr. Venetia Todd Minerva, [address] [place], [place] [address] [phone] Dr. Charlie Sharper [place], [address] Myrna Teddie [place], [place], [place] [address] [phone] Dr Emilio Jeanine Aurora, [address], [place], [place] [address], [phone] Dr. Lyell A. Akintayo, [address], [place], [place] [address] [phone] Dr. Maple Celena Moats, [address] [place], [place] [address]  [phone] Dr. Britta RAMAN. Noralyn [address], [place], [place] [address], [phone]  Dr. Erminio Sheryle Lesches, [address], [place], [place] [address], [phone], MSW,LCSW,LCAS, [address], [place], [place] [address] [phone], Private pay only                                                          SUPPORT GROUPS  Overdose Loss Support Group [phone] or www.hospicegso.org *Contact Kimberly Grove([phone] or [email or web address] [email or web address]) or Mary Easton([phone] or [email or web address] [email or web address]) for date, time, location, and more information about support group  [facility] 5807448207 or fraudpod.com.pt [address], [place], [place] [address] *Peer support for students and collegiate recovery community, NO TREATMENT, SUPPORT ONLY FOR STUDENTS AND FUTURE STUDENTS  **All information above is for informational purposes only, and is subject to change, please contact agencies and/or providers to find out any updated information

## 2023-01-27 ENCOUNTER — Other Ambulatory Visit (HOSPITAL_COMMUNITY): Payer: Self-pay

## 2023-01-27 DIAGNOSIS — M4647 Discitis, unspecified, lumbosacral region: Secondary | ICD-10-CM | POA: Diagnosis not present

## 2023-01-27 DIAGNOSIS — M869 Osteomyelitis, unspecified: Secondary | ICD-10-CM

## 2023-01-27 MED ORDER — CEFADROXIL 500 MG PO CAPS
1000.0000 mg | ORAL_CAPSULE | Freq: Two times a day (BID) | ORAL | 0 refills | Status: AC
  Filled 2023-01-27: qty 116, 29d supply, fill #0

## 2023-01-27 MED ORDER — METRONIDAZOLE 500 MG PO TABS
500.0000 mg | ORAL_TABLET | Freq: Two times a day (BID) | ORAL | 0 refills | Status: AC
  Filled 2023-01-27: qty 8, 4d supply, fill #0

## 2023-01-27 MED ORDER — EMTRICITABINE-TENOFOVIR DF 200-300 MG PO TABS
1.0000 | ORAL_TABLET | Freq: Every day | ORAL | 0 refills | Status: AC
  Filled 2023-01-27 (×2): qty 30, 30d supply, fill #0

## 2023-01-27 MED ORDER — QUETIAPINE FUMARATE 200 MG PO TABS
200.0000 mg | ORAL_TABLET | Freq: Every day | ORAL | Status: DC
  Administered 2023-01-27: 200 mg via ORAL
  Filled 2023-01-27: qty 1
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
## 2023-01-27 NOTE — Progress Notes (Signed)
    [facility]  Date of Admission:  01/08/2023             BRIEF PROGRESS NOTE:  Summer Cain has completed IV treatment with combination of ampicillin  and transitioned to Cefazolin  secondary to muscle spasm/cramping after receiving ampicillin . Ok to change to Cefadroxil  1 g PO bid and okay for discharge from ID standpoint. Follow up in ID clinic (02/17/23 at 1:00pm). Continue metronidazole  for 7 days for treatment of trichomonas.   Greg Odas Ozer, NP [facility]  01/27/2023  3:45 PM

## 2023-01-27 NOTE — Consult Note (Addendum)
 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 has been off them for several weeks/months.  Mode of Visit: In person   Psychiatry Consult Evaluation  Service Date: January 27, 2023 LOS:  LOS: 18 days  Chief Complaint  Want to get back on psych meds   Primary Psychiatric Diagnoses  1.  Bipolar disorder, depressive episode  2.  Polysubstance Use Disorder (Cocaine, Crack, Opioid, THC)   Assessment  Saamiya Jeppsen is a 38 y.o. female admitted: Presented to the ED on  01/08/2023  9:11 AM for severe back pain. MRI was concerning for discitis/osteomyelitis of L5/S1 w/ intervertebral purulence.  She is currently being treated with antibiotic therapy.  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   Her current presentation is bipolar, depressive type. Reports that her mood is cycling and feels like she is more depressed currently.  Does have episodes of increased irritability, impulsivity, increased risky behaviorand anger outbursts. Patient reports noncompliance on previous medication trials and has not been on medications the past several years 2020. Patient has been trialed on multiple psychiatric medications including Depakote , Lithium, Seroqeul, Trazodone  and Remeron. Reports moderate to good effects with Seroquel .  Had mild symptom control on Remeron, trazodone , lithium and Depakote . Patient was able to maintain sobriety after placement in a rehab facility in 2023 with suboxone . The past several months she has been obtaining Suboxone  without a prescription.  Due to  being unable to get back with step-by-step program here in [place].   On the morning assessment, patient reports mild withdrawal symptoms including body aches, nasal congestion, anxious, nausea and small bouts of emesis.  COWS 6. Patient received Suboxone  dose early afternoon yesterday and did not ask for as needed medication overnight.  Patient reports improvements on Suboxone  with pain and withdrawal symptoms.  Patient now able to move around her room and complete task.  Patient denies any symptoms of depression, and only mild anxiety.  Denies SI, HI, AVH.  Attributes good symptom control on Seroquel  as well.  Just increasing Seroquel  with the patient to optimize mood symptoms and patient amenable.  Will receive Suboxone  8-2 mg twice daily.  Patient provided with resources to establishment Suboxone  clinic and encouraged to call. Pending tomorrow morning assessment, patient may be stable for discharge from a psychiatric standpoint and patient is aware. Follow-up with psychiatry and NP Harl on 02/07/2023 at 2:00 PM    Please see plan below for detailed recommendations.   Diagnoses:  Active Hospital problems: Principal Problem:   Discitis - with E. 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   History of endocarditis   Normocytic anemia   Thrombocytosis   History of DVT (deep vein thrombosis)   Prolonged QT interval   Mood disorder (HCC)   Positive RPR test   Protein-calorie malnutrition, severe   Hypokalemia   Adverse reaction to anti-infectives   Trichomonas infection   History of syphilis   Encounter for HIV  pre-exposure prophylaxis    Plan   ## Psychiatric Medication Recommendations:  - Increased Seroquel  to 200 mg on 1/9 for moods stability, irritability and insomnia - Increased Suboxone  8-2 to BID on 1/9   ## Medical Decision  Making Capacity: Not specifically addressed in this encounter  ## Further Work-up:  TSH, B12, folate, While pt on Qtc prolonging medications, please monitor & replete K+ to 4 and Mg2+ to 2, or TOC consult for substance abuse resources -- most recent EKG on 12/31 had QtC of 452 -- Pertinent labwork reviewed earlier this admission includes: Hep C positive, syphilis positive with RPR and T. Palladium antibodies, hypokalemic 3.4. No UDS available to review.   ## Disposition:-- No current psychiatric contraindications to discharge -- Plan Post Discharge/Psychiatric Care Follow-up resources has follow-up appointment with NP Harl on 02/07/2023 at 2:00 PM    ## Behavioral / Environmental: -Utilize compassion and acknowledge the patient's experiences while setting clear and realistic expectations for care.   ## Safety and Observation Level:  - Based on my clinical evaluation, I estimate the patient to be at low risk of self harm in the current setting. - At this time, we recommend  routine. This decision is based on my review of the chart including patient's history and current presentation, interview of the patient, mental status examination, and consideration of suicide risk including evaluating suicidal ideation, plan, intent, suicidal or self-harm behaviors, risk factors, and protective factors. This judgment is based on our ability to directly address suicide risk, implement suicide prevention strategies, and develop a safety plan while the patient is in the clinical setting. Please contact our team if there is a concern that risk level has changed.  CSSR Risk Category:C-SSRS RISK CATEGORY: No Risk  Suicide Risk Assessment: Patient has following modifiable risk factors for suicide: untreated depression, under treated depression , social isolation, recklessness, medication noncompliance, and lack of access to outpatient mental health resources, which we are addressing by restarting medication to  optimize mood symptoms. Patient has following non-modifiable or demographic risk factors for suicide: psychiatric hospitalization Patient has the following protective factors against suicide: Cultural, spiritual, or religious beliefs that discourage suicide and no history of suicide attempts  Thank you for this consult request. Recommendations have been communicated to the primary team.  We will continue to follow at this time.   PATTI OLDEN, MD       History of Present Illness  Relevant Aspects of Hospital Lashaundra Klapper is a 38 y.o. female admitted: Presented to the ED on  01/08/2023  9:11 AM for severe back pain. MRI was concerning for discitis/osteomyelitis of L5/S1 w/ intervertebral purulence.  She is currently being treated with antibiotic therapy.  Course: Neurosurgery was consulted and recommended antibiotic therapy for treatment.  Patient has had pain controlled with multimodal pain control including Oxy ibuprofen  and Tylenol  and Robaxin .  Psychiatry was consulted as a request of the patient to get back on psychiatric medications.  Patient Report:   On the morning assessment, patient reports mild withdrawal symptoms including body aches, nasal congestion, anxious, nausea and small bouts of emesis.  COWS 6. Patient received Suboxone  dose early afternoon yesterday and did not ask for as needed medication overnight.  Patient reports improvements on Suboxone  with pain and withdrawal symptoms.  Patient now able to move around her room and complete task.  Patient denies any symptoms of depression, and only mild anxiety.  Denies SI, HI, AVH.  Attributes good symptom control on Seroquel  as well.  Just increasing Seroquel  with the patient to optimize mood symptoms and patient amenable.  Will receive Suboxone  8-2 mg twice daily.  Patient provided with resources to establishment Suboxone  clinic and encouraged to call.  Pending tomorrow morning assessment, patient may be stable for discharge from a  psychiatric standpoint and patient is aware.  Psych ROS:  Depression: Many prior depressive episodes. Currently more depressed. Reports episodes of self isolation and tearful episodes. Anxiety:  outshadowed by manic and depressive episodes, mostly social. Manifests.  Mania (lifetime and current): pt endorses (lifetime) sx of decreased need for sleep, impuslivity, speech rapid.  Hard to figure out how many of these sx occurred in same time frame.  Psychosis: (lifetime and current): yes, when using or manic. Not outside of those periods. None this hospitalization.   Collateral information:  None today  Review of Systems  Constitutional:  Negative for chills, diaphoresis and fever.  HENT:  Positive for congestion.   Gastrointestinal:  Positive for nausea and vomiting. Negative for abdominal pain, constipation and diarrhea.  Musculoskeletal:  Positive for myalgias.  Neurological:  Positive for headaches.  Psychiatric/Behavioral:  Negative for depression, hallucinations, substance abuse and suicidal ideas. The patient is nervous/anxious. The patient does not have insomnia.     - psych specific ROS above  Psychiatric and Social History  Psychiatric History:  Information collected from pt, medical record  Prev Dx/Sx: Bipolar Disorder, Dissociative Identity Disorder, Polysubstance Use Disorder ( Cocaine, amphetamines, opioid, tobacco)  Current Psych Provider: none Home Meds (current): none Previous Med Trials: several - does best on mood stabilizers and poorly on antidepressants. Mentioned seroquel , depakote , mirtazapine max 30, trazodone  ?, lithium, zyprexa. Doesn't think remeron helpful.  Max lifetime dose seroquel  200.  Therapy: not currently   Prior Psych Hospitalization: Numerous from age 37 on. Had a period of stability between ?early 70s and 30s.   Prior Self Harm: more apathy, no true attempts.  Prior Violence: denied  Family Psych History: yes, everybody, Schizophrenia, Bipolar   Disorder Family Hx suicide: unclear  Social History:  Developmental Hx: no Educational Hx: HS some college Occupational Hx: Participate in gig economy Legal Hx: mostly drug or mania related (joyrides).  Living Situation: with an ex  Spiritual Hx: yes - source of support Access to weapons/lethal means: no   Substance History Alcohol: Occasional. Does not consider this current drug of abuse - priorly did drink heavily.  Type of alcohol whatever available Last Drink prior to admission.  Number of drinks per day sounds like 1-2 not daily  History of alcohol withdrawal seizures; sounds like maybe once in setting of EtOh and Bzd use History of DT's unclear - talks around this Tobacco: 1 pack every other day before admission.  Illicit drugs: hx fentanyl , cocaine. Cocaine was ?a couple of times a week? Before she came in. Fentanyl  mostly remote. Weed helped with pain, not daily.  Prescription drug abuse: illicit opioids Rehab hx: yes, multiple   Exam Findings  Physical Exam:  Vital Signs:  Temp:  [97.6 F (36.4 C)-97.8 F (36.6 C)] 97.8 F (36.6 C) (01/09 0337) Pulse Rate:  [56-103] 56 (01/09 0337) Resp:  [16-18] 16 (01/09 0337) BP: (99-108)/(56-77) 99/56 (01/09 0337) SpO2:  [91 %-99 %] 99 % (01/09 0337) Weight:  [49.8 kg] 49.8 kg (01/09 0342) Blood pressure (!) 99/56, pulse (!) 56, temperature 97.8 F (36.6 C), resp. rate 16, height 5' (1.524 m), weight 49.8 kg, SpO2 99%. Body mass index is 21.44 kg/m.   Physical Exam HENT:  Mouth/Throat:     Dentition: Abnormal dentition. Dental caries present.     Comments: Poor dentition with multiple caries and teeth missing Eyes:     Extraocular Movements: Extraocular movements intact.     Conjunctiva/sclera: Conjunctivae normal.     Pupils: Pupils are equal, round, and reactive to light.  Neurological:     Mental Status: She is oriented to person, place, and time.  Psychiatric:        Attention and Perception: She does not  perceive auditory or visual hallucinations.        Mood and Affect: Mood is anxious. Mood is not depressed. Affect is not labile or tearful.        Behavior: Behavior is not agitated, aggressive, hyperactive or combative. Behavior is cooperative.        Thought Content: Thought content is not paranoid or delusional. Thought content does not include homicidal or suicidal ideation.        Judgment: Judgment is not impulsive.    Mental Status Exam:   General Appearance: Disheveled, laying in bed with covers over her face  Orientation:  Full (Time, Place, and Person)  Memory:  Immediate;   Fair Remote;   Fair  Concentration:  Concentration: Fair and Attention Span: Fair  Recall:  Fair  Attention  Fair  Eye Contact:  Good  Speech:  normal rate clear and coherent  Language:  Good  Volume:  Normal  Mood:  Feeling better after starting suboxone    Affect:  Congruent and appropriate   Thought Process:  Coherent   Thought Content:  Logical and future oriented.   Suicidal Thoughts:  No  Homicidal Thoughts:  No  Judgement:  Fair to pair   Insight:  Good   Psychomotor Activity: Normal, less fidgety than prior exam   Akathisia:  No  Fund of Knowledge:  Fair      Assets:  Manufacturing Systems Engineer Desire for Improvement Vocational/Educational Others:  religious affiliation   Cognition:  WNL  ADL's:  Intact  AIMS (if indicated):        Other History   These have been pulled in through the EMR, reviewed, and updated if appropriate.  Family History:  The patient's family history includes Depression in her mother; Diabetes in her paternal grandmother; Hyperlipidemia in her father; Hypertension in her mother.  Medical History: Past Medical History:  Diagnosis Date  . Abscess of aortic root   . ADHD   . Anxiety   . Bipolar 1 disorder, manic, moderate (HCC)   . Dissociative identity disorder (HCC)   . Endocarditis of mitral valve   . Endocarditis of tricuspid valve   . IV drug user   .  OCD (obsessive compulsive disorder)   . Pregnant   . Scoliosis     Surgical History: Past Surgical History:  Procedure Laterality Date  . IR LUMBAR DISC ASPIRATION W/IMG GUIDE  01/11/2023  . IR LUMBAR DISC ASPIRATION W/IMG GUIDE  01/14/2023  . IRRIGATION AND DEBRIDEMENT ELBOW Right 09/17/2019   Procedure: IRRIGATION AND DEBRIDEMENT RIGHT  ELBOW AND RIGHT WRIST;  Surgeon: Beverley Evalene BIRCH, MD;  Location: [facility] OR;  Service: Orthopedics;  Laterality: Right;  . KIDNEY STONE SURGERY    . RADIOLOGY WITH ANESTHESIA N/A 12/10/2020   Procedure: MRI WITH ANESTHESIA- THORACIC WITH AND WITHOUT CONTRAST CERVICAL WITH AND WITHOUT CONTRAST;  Surgeon: Radiologist, Medication, MD;  Location: [facility] OR;  Service: Radiology;  Laterality: N/A;  . RADIOLOGY WITH ANESTHESIA N/A 01/14/2023   Procedure: IR WITH  ANESTHESIA;  Surgeon: Radiologist, Medication, MD;  Location: [facility] OR;  Service: Radiology;  Laterality: N/A;  . TEE WITHOUT CARDIOVERSION N/A 12/10/2020   Procedure: TRANSESOPHAGEAL ECHOCARDIOGRAM (TEE);  Surgeon: Hobart Powell BRAVO, MD;  [facility] ENDOSCOPY;  Service: Cardiovascular;  Laterality: N/A;     Medications:   Current Facility-Administered Medications:  .  acetaminophen  (TYLENOL ) tablet 650 mg, 650 mg, Oral, Q6H PRN **OR** acetaminophen  (TYLENOL ) suppository 650 mg, 650 mg, Rectal, Q6H PRN, Reome, Earle J, RPH .  albuterol  (PROVENTIL ) (2.5 MG/3ML) 0.083% nebulizer solution 2.5 mg, 2.5 mg, Nebulization, Q6H PRN, Smith, Rondell A, MD .  bisacodyl  (DULCOLAX) EC tablet 5 mg, 5 mg, Oral, Daily PRN, Chavez, Abigail, NP .  bisacodyl  (DULCOLAX) suppository 10 mg, 10 mg, Rectal, Daily PRN, Akula, Vijaya, MD .  buprenorphine -naloxone  (SUBOXONE ) 2-0.5 mg per SL tablet 2 tablet, 2 tablet, Sublingual, Once PRN, Cinderella, Margaret A .  buprenorphine -naloxone  (SUBOXONE ) 8-2 mg per SL tablet 1 tablet, 1 tablet, Sublingual, BID, Cinderella, Margaret A .  ceFAZolin  (ANCEF ) IVPB 2g/100 mL premix, 2 g,  Intravenous, Q8H, Fleeta Rothman, Jomarie SAILOR, MD, Last Rate: 200 mL/hr at 01/27/23 0630, 2 g at 01/27/23 0630 .  clonazePAM  (KLONOPIN ) tablet 0.5 mg, 0.5 mg, Oral, BID PRN, Laurence Locus, DO, 0.5 mg at 01/26/23 0935 .  cloNIDine  (CATAPRES ) tablet 0.1 mg, 0.1 mg, Oral, TID PRN, Cinderella, Margaret A .  enoxaparin  (LOVENOX ) injection 40 mg, 40 mg, Subcutaneous, Q24H, Smith, Rondell A, MD, 40 mg at 01/26/23 2215 .  famotidine  (PEPCID ) tablet 40 mg, 40 mg, Oral, BID, Laurence Locus, DO, 40 mg at 01/26/23 2214 .  feeding supplement (ENSURE ENLIVE / ENSURE PLUS) liquid 237 mL, 237 mL, Oral, TID BM, Akula, Vijaya, MD, 237 mL at 01/26/23 1949 .  gabapentin  (NEURONTIN ) capsule 200 mg, 200 mg, Oral, BID, Laurence Locus, DO, 200 mg at 01/26/23 1521 .  gabapentin  (NEURONTIN ) capsule 600 mg, 600 mg, Oral, QHS, Laurence Locus, DO, 600 mg at 01/26/23 2214 .  hydrOXYzine  (ATARAX ) tablet 50 mg, 50 mg, Oral, TID PRN, Akula, Vijaya, MD, 50 mg at 01/25/23 0606 .  ibuprofen  (ADVIL ) tablet 400 mg, 400 mg, Oral, QID, Laurence Locus, DO, 400 mg at 01/26/23 2214 .  methocarbamol  (ROBAXIN ) tablet 500 mg, 500 mg, Oral, Q4H PRN, Laurence Locus, DO, 500 mg at 01/26/23 1621 .  metroNIDAZOLE  (FLAGYL ) tablet 500 mg, 500 mg, Oral, Q12H, Fleeta Rothman, Jomarie SAILOR, MD, 500 mg at 01/26/23 2214 .  multivitamin with minerals tablet 1 tablet, 1 tablet, Oral, Daily, Tucker, Mary-Ashlyn, RPH, 1 tablet at 01/26/23 9070 .  ondansetron  (ZOFRAN -ODT) disintegrating tablet 8 mg, 8 mg, Oral, Q8H PRN, Laurence Locus, DO, 8 mg at 01/18/23 1731 .  polyethylene glycol (MIRALAX  / GLYCOLAX ) packet 17 g, 17 g, Oral, BID, Akula, Vijaya, MD, 17 g at 01/20/23 2340 .  QUEtiapine  (SEROQUEL ) tablet 100 mg, 100 mg, Oral, QHS, Cinderella, Margaret A, 100 mg at 01/26/23 2214 .  senna-docusate (Senokot-S) tablet 2 tablet, 2 tablet, Oral, BID, Akula, Vijaya, MD, 2 tablet at 01/22/23 2227 .  sodium chloride  flush (NS) 0.9 % injection 3 mL, 3 mL, Intravenous, Q12H, Smith, Rondell A, MD, 3 mL at  01/26/23 2215  Allergies: Allergies  Allergen Reactions  . Latex Swelling    PATTI OLDEN, MD PGY-1

## 2023-01-27 NOTE — Plan of Care (Signed)
  Problem: Education: Goal: Knowledge of General Education information will improve Description: Including pain rating scale, medication(s)/side effects and non-pharmacologic comfort measures Outcome: Progressing   Problem: Clinical Measurements: Goal: Ability to maintain clinical measurements within normal limits will improve Outcome: Progressing Goal: Will remain free from infection Outcome: Progressing   Problem: Pain Management: Goal: General experience of comfort will improve Outcome: Progressing   Problem: Safety: Goal: Ability to remain free from injury will improve Outcome: Progressing   Problem: Skin Integrity: Goal: Risk for impaired skin integrity will decrease Outcome: Progressing
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
## 2023-01-27 NOTE — Plan of Care (Signed)
  Problem: Nutrition: Goal: Adequate nutrition will be maintained Outcome: Progressing   Problem: Elimination: Goal: Will not experience complications related to bowel motility Outcome: Progressing   Problem: Pain Management: Goal: General experience of comfort will improve Outcome: Progressing   Problem: Safety: Goal: Ability to remain free from injury will improve Outcome: Progressing

## 2023-01-28 ENCOUNTER — Other Ambulatory Visit (HOSPITAL_COMMUNITY): Payer: Self-pay

## 2023-01-28 DIAGNOSIS — M869 Osteomyelitis, unspecified: Secondary | ICD-10-CM | POA: Diagnosis not present

## 2023-01-28 DIAGNOSIS — M4647 Discitis, unspecified, lumbosacral region: Secondary | ICD-10-CM | POA: Diagnosis not present

## 2023-01-28 LAB — CBC
HCT: 33.1 % — ABNORMAL LOW (ref 36.0–46.0)
Hemoglobin: 10.2 g/dL — ABNORMAL LOW (ref 12.0–15.0)
MCH: 26.7 pg (ref 26.0–34.0)
MCHC: 30.8 g/dL (ref 30.0–36.0)
MCV: 86.6 fL (ref 80.0–100.0)
Platelets: 308 10*3/uL (ref 150–400)
RBC: 3.82 MIL/uL — ABNORMAL LOW (ref 3.87–5.11)
RDW: 16.7 % — ABNORMAL HIGH (ref 11.5–15.5)
WBC: 7 10*3/uL (ref 4.0–10.5)
nRBC: 0 % (ref 0.0–0.2)

## 2023-01-28 LAB — BASIC METABOLIC PANEL
Anion gap: 8 (ref 5–15)
BUN: 23 mg/dL — ABNORMAL HIGH (ref 6–20)
CO2: 29 mmol/L (ref 22–32)
Calcium: 9.1 mg/dL (ref 8.9–10.3)
Chloride: 99 mmol/L (ref 98–111)
Creatinine, Ser: 0.66 mg/dL (ref 0.44–1.00)
GFR, Estimated: >60 mL/min (ref >60)
Glucose, Bld: 120 mg/dL — ABNORMAL HIGH (ref 70–99)
Potassium: 4.1 mmol/L (ref 3.5–5.1)
Sodium: 136 mmol/L (ref 135–145)

## 2023-01-28 MED ORDER — CYCLOBENZAPRINE HCL 5 MG PO TABS
5.0000 mg | ORAL_TABLET | Freq: Three times a day (TID) | ORAL | 0 refills | Status: AC
  Filled 2023-01-28: qty 30, 10d supply, fill #0

## 2023-01-28 MED ORDER — IBUPROFEN 800 MG PO TABS
800.0000 mg | ORAL_TABLET | Freq: Three times a day (TID) | ORAL | 0 refills | Status: AC | PRN
  Filled 2023-01-28: qty 30, 10d supply, fill #0

## 2023-01-28 MED ORDER — CYCLOBENZAPRINE HCL 5 MG PO TABS
5.0000 mg | ORAL_TABLET | Freq: Three times a day (TID) | ORAL | Status: DC
  Administered 2023-01-28: 5 mg via ORAL
  Filled 2023-01-28: qty 1

## 2023-01-28 MED ORDER — ACETAMINOPHEN 325 MG PO TABS: 650.0000 mg | ORAL_TABLET | Freq: Four times a day (QID) | ORAL | Status: AC | PRN

## 2023-01-28 MED ORDER — LIDOCAINE 5 % EX PTCH
1.0000 | MEDICATED_PATCH | CUTANEOUS | Status: DC
  Administered 2023-01-28: 1 via TRANSDERMAL
  Filled 2023-01-28: qty 1

## 2023-01-28 MED ORDER — GABAPENTIN 300 MG PO CAPS
600.0000 mg | ORAL_CAPSULE | Freq: Every day | ORAL | 1 refills | Status: AC
  Filled 2023-01-28: qty 60, 30d supply, fill #0

## 2023-01-28 MED ORDER — BUPRENORPHINE HCL-NALOXONE HCL 8-2 MG SL SUBL
1.0000 | SUBLINGUAL_TABLET | Freq: Two times a day (BID) | SUBLINGUAL | 0 refills | Status: AC
  Filled 2023-01-28: qty 14, 7d supply, fill #0

## 2023-01-28 MED ORDER — HYDROXYZINE HCL 25 MG PO TABS
50.0000 mg | ORAL_TABLET | Freq: Three times a day (TID) | ORAL | 0 refills | Status: AC | PRN
  Filled 2023-01-28: qty 60, 10d supply, fill #0

## 2023-01-28 MED ORDER — SENNOSIDES-DOCUSATE SODIUM 8.6-50 MG PO TABS: 2.0000 | ORAL_TABLET | Freq: Two times a day (BID) | ORAL | Status: AC

## 2023-01-28 MED ORDER — FAMOTIDINE 40 MG PO TABS
40.0000 mg | ORAL_TABLET | Freq: Two times a day (BID) | ORAL | 0 refills | Status: AC
  Filled 2023-01-28: qty 60, 30d supply, fill #0

## 2023-01-28 MED ORDER — IBUPROFEN 600 MG PO TABS: 600.0000 mg | ORAL_TABLET | Freq: Four times a day (QID) | ORAL | Status: DC

## 2023-01-28 MED ORDER — QUETIAPINE FUMARATE 200 MG PO TABS
200.0000 mg | ORAL_TABLET | Freq: Every day | ORAL | 1 refills | Status: AC
  Filled 2023-01-28: qty 30, 30d supply, fill #0

## 2023-01-28 MED ORDER — GABAPENTIN 100 MG PO CAPS
200.0000 mg | ORAL_CAPSULE | Freq: Two times a day (BID) | ORAL | 1 refills | Status: AC
  Filled 2023-01-28: qty 120, 30d supply, fill #0

## 2023-01-28 MED ORDER — LIDOCAINE 5 % EX PTCH: 1.0000 | MEDICATED_PATCH | CUTANEOUS | Status: AC

## 2023-01-28 MED ORDER — ADULT MULTIVITAMIN W/MINERALS CH: 1.0000 | ORAL_TABLET | Freq: Every day | ORAL | Status: AC

## 2023-01-28 MED ORDER — POLYETHYLENE GLYCOL 3350 17 G PO PACK: 17.0000 g | PACK | Freq: Two times a day (BID) | ORAL | Status: AC

## 2023-01-28 MED ORDER — CEFADROXIL 500 MG PO CAPS
1000.0000 mg | ORAL_CAPSULE | Freq: Two times a day (BID) | ORAL | Status: DC
  Administered 2023-01-28: 1000 mg via ORAL
  Filled 2023-01-28 (×3): qty 2

## 2023-01-28 NOTE — Progress Notes (Addendum)
 AVS reviewed and questions answered  patient to be taken to dc lounge and medication be be picked up from [facility]

## 2023-01-28 NOTE — TOC Progression Note (Addendum)
 Transition of Care (TOC) - Progression Note   Called Mitch with [employer] for walker. Patient requesting cab voucher home . Nurse getting transportation waiver signed and confirming address [address] , [place], [place] [address]   Cab voucher on chart  Patient Details  Name: Summer Cain MRN: [id number] Date of Birth: [birth year]  Transition of Care [employer]) CM/SW Contact  Stephane, Powell Jansky, RN Phone Number: 01/28/2023, 10:40 AM  Clinical Narrative:       Expected Discharge Plan: Home/Self Care Barriers to Discharge: Continued Medical Work up  Expected Discharge Plan and Services   Discharge Planning Services: CM Consult Post Acute Care Choice: NA   Expected Discharge Date: 01/28/23               DME Arranged: N/A DME Agency: NA       HH Arranged: NA HH Agency: NA         Social Determinants of Health (SDOH) Interventions SDOH Screenings   Food Insecurity: No Food Insecurity (01/10/2023)  Recent Concern: Food Insecurity - Food Insecurity Present (01/09/2023)  Housing: High Risk (01/10/2023)  Transportation Needs: No Transportation Needs (01/09/2023)  Utilities: At Risk (01/10/2023)  Social Connections: Unknown (06/02/2021)   Received from [facility], [facility]  Tobacco Use: High Risk (01/14/2023)    Readmission Risk Interventions     No data to display
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
## 2023-01-28 NOTE — Progress Notes (Signed)
 Midline removed per order. Pt tolerated well.

## 2023-01-28 NOTE — Plan of Care (Signed)
  Problem: Pain Management: Goal: General experience of comfort will improve Outcome: Progressing   Problem: Safety: Goal: Ability to remain free from injury will improve Outcome: Progressing   Problem: Skin Integrity: Goal: Risk for impaired skin integrity will decrease Outcome: Progressing

## 2023-01-28 NOTE — Consult Note (Signed)
 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 has been off them for several weeks/months.  Mode of Visit: In person   Psychiatry Consult Evaluation  Service Date: January 28, 2023 LOS:  LOS: 19 days  Chief Complaint  Want to get back on psych meds   Primary Psychiatric Diagnoses  1.  Bipolar disorder, depressive episode  2.  Polysubstance Use Disorder (Cocaine, Crack, Opioid, THC)   Assessment  Aeralyn Barna is a 38 y.o. female admitted: Presented to the ED on  01/08/2023  9:11 AM for severe back pain. MRI was concerning for discitis/osteomyelitis of L5/S1 w/ intervertebral purulence.  She is currently being treated with antibiotic therapy.  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   Her current presentation is bipolar, depressive type. Reports that her mood is cycling and feels like she is more depressed currently.  Does have episodes of increased irritability, impulsivity, increased risky behaviorand anger outbursts. Patient reports noncompliance on previous medication trials and has not been on medications the past several years 2020. Patient has been trialed on multiple psychiatric medications including Depakote , Lithium, Seroqeul, Trazodone  and Remeron. Reports moderate to good effects with Seroquel .  Had mild symptom control on Remeron, trazodone , lithium and Depakote . Patient was able to maintain sobriety after placement in a rehab facility in 2023 with suboxone . The past several months she has been obtaining Suboxone  without a prescription.  Due  to being unable to get back with step-by-step program here in [facility].   On the morning assessment, patient continues to have mild withdrawal symptoms including body aches, nasal congestion, anxious, nausea and small bouts of emesis with limited PO intake. COWS 6. Patient believes she overexerted herself with therapy yesterday. Sleep improved on seroquel . Continues to report some pain and requested additional medicine. Patient aware appointment with behavioral health is to refill her seroquel . Discussed patient would be discharged on short course prescription and she would be responsible for establishing with suboxone  clinic for further refills.  Patient denies any symptoms of depression, and only mild anxiety.  Denies SI, HI, AVH.  Stable for home discharge and will follow-up with psychiatry (NP Harl) on 02/07/2023 at 2:00 PM for psychotropic medication management. She was provided resources to establish with a suboxone  clinic and instructed to call for appointment.   Please see plan below for detailed recommendations.   Diagnoses:  Active Hospital problems: Principal Problem:   Discitis - with E. 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   History of endocarditis   Normocytic anemia   Thrombocytosis   History of DVT (deep vein thrombosis)   Mood disorder (HCC)   Positive RPR test   Protein-calorie malnutrition, severe   Hypokalemia   Adverse reaction to anti-infectives   Trichomonas infection   History of syphilis   Encounter for HIV pre-exposure prophylaxis    Plan   ## Psychiatric Medication Recommendations:  -  Continue Seroquel  to 200 mg on 1/9 for moods stability, irritability and insomnia - Continue Suboxone  8-2 to BID at time of discharge, discharged on 7 day  course and presented with resources for outpatient follow-up  -- Can consider restarting an anti-depressant in outpatient setting if depression not well controlled  ## Medical Decision Making Capacity: Not specifically addressed in this encounter  ## Further Work-up:  TSH, B12, folate, While pt on Qtc prolonging medications, please monitor & replete K+ to 4 and Mg2+ to 2, or TOC consult for substance abuse resources -- most recent EKG on 12/31 had QtC of 452 -- Pertinent labwork reviewed earlier this admission includes: Hep C positive, syphilis positive with RPR and T. Palladium antibodies, hypokalemic 3.4. No UDS available to review.   ## Disposition:-- No current psychiatric contraindications to discharge -- Plan Post Discharge/Psychiatric Care Follow-up resources has follow-up appointment with NP Harl on 02/07/2023 at 2:00 PM    ## Behavioral / Environmental: -Utilize compassion and acknowledge the patient's experiences while setting clear and realistic expectations for care.   ## Safety and Observation Level:  - Based on my clinical evaluation, I estimate the patient to be at low risk of self harm in the current setting. - At this time, we recommend  routine. This decision is based on my review of the chart including patient's history and current presentation, interview of the patient, mental status examination, and consideration of suicide risk including evaluating suicidal ideation, plan, intent, suicidal or self-harm behaviors, risk factors, and protective factors. This judgment is based on our ability to directly address suicide risk, implement suicide prevention strategies, and develop a safety plan while the patient is in the clinical setting. Please contact our team if there is a concern that risk level has changed.  CSSR Risk Category:C-SSRS RISK CATEGORY: No Risk  Suicide Risk Assessment: Patient has following modifiable risk factors for suicide: untreated depression, under  treated depression , social isolation, recklessness, medication noncompliance, and lack of access to outpatient mental health resources, which we are addressing by restarting medication to optimize mood symptoms. Patient has following non-modifiable or demographic risk factors for suicide: psychiatric hospitalization Patient has the following protective factors against suicide: Cultural, spiritual, or religious beliefs that discourage suicide and no history of suicide attempts  Thank you for this consult request. Recommendations have been communicated to the primary team.  We will sign off at this time.   PATTI OLDEN, MD       History of Present Illness  Relevant Aspects of Hospital Analeah Allnutt is a 38 y.o. female admitted: Presented to the ED on  01/08/2023  9:11 AM for severe back pain. MRI was concerning for discitis/osteomyelitis of L5/S1 w/ intervertebral purulence.  She is currently being treated with antibiotic therapy.  Course: Neurosurgery was consulted and recommended antibiotic therapy for treatment.  Patient has had pain controlled with multimodal pain control including Oxy ibuprofen  and Tylenol  and Robaxin .  Psychiatry was consulted as a request of the patient to get back on psychiatric medications.  Patient Report:   On the morning assessment, patient continues to have mild withdrawal symptoms including body aches, nasal congestion, anxious, nausea and small bouts of emesis with limited PO intake. COWS 6. Patient believes she overexerted herself with therapy yesterday. Sleep improved on seroquel . Continues to report some pain and requested additional medicine. Patient aware appointment with behavioral health is to refill her seroquel . Discussed patient would be discharged on short course prescription and she would be responsible for  establishing with suboxone  clinic for further refills. Denies SI/HI/AVH.   Psych ROS:  Depression: Many prior depressive episodes. Currently more  depressed. Reports episodes of self isolation and tearful episodes. Anxiety:  outshadowed by manic and depressive episodes, mostly social. Manifests.  Mania (lifetime and current): pt endorses (lifetime) sx of decreased need for sleep, impuslivity, speech rapid.  Hard to figure out how many of these sx occurred in same time frame.  Psychosis: (lifetime and current): yes, when using or manic. Not outside of those periods. None this hospitalization.   Collateral information:  None today  Review of Systems  Constitutional:  Negative for chills, diaphoresis and fever.  HENT:  Negative for congestion.   Gastrointestinal:  Negative for abdominal pain, constipation, diarrhea, nausea and vomiting.  Musculoskeletal:  Positive for back pain and myalgias.  Neurological:  Negative for headaches.  Psychiatric/Behavioral:  Negative for depression, hallucinations, substance abuse and suicidal ideas. The patient is nervous/anxious. The patient does not have insomnia.     - psych specific ROS above  Psychiatric and Social History  Psychiatric History:  Information collected from pt, medical record  Prev Dx/Sx: Bipolar Disorder, Dissociative Identity Disorder, Polysubstance Use Disorder ( Cocaine, amphetamines, opioid, tobacco)  Current Psych Provider: none Home Meds (current): none Previous Med Trials: several - does best on mood stabilizers and poorly on antidepressants. Mentioned seroquel , depakote , mirtazapine max 30, trazodone  ?, lithium, zyprexa. Doesn't think remeron helpful.  Max lifetime dose seroquel  200.  Therapy: not currently   Prior Psych Hospitalization: Numerous from age 65 on. Had a period of stability between ?early 70s and 30s.   Prior Self Harm: more apathy, no true attempts.  Prior Violence: denied  Family Psych History: yes, everybody, Schizophrenia, Bipolar  Disorder Family Hx suicide: unclear  Social History:  Developmental Hx: no Educational Hx: HS some  college Occupational Hx: Participate in gig economy Legal Hx: mostly drug or mania related (joyrides).  Living Situation: with an ex  Spiritual Hx: yes - source of support Access to weapons/lethal means: no   Substance History Alcohol: Occasional. Does not consider this current drug of abuse - priorly did drink heavily.  Type of alcohol whatever available Last Drink prior to admission.  Number of drinks per day sounds like 1-2 not daily  History of alcohol withdrawal seizures; sounds like maybe once in setting of EtOh and Bzd use History of DT's unclear - talks around this Tobacco: 1 pack every other day before admission.  Illicit drugs: hx fentanyl , cocaine. Cocaine was ?a couple of times a week? Before she came in. Fentanyl  mostly remote. Weed helped with pain, not daily.  Prescription drug abuse: illicit opioids Rehab hx: yes, multiple   Exam Findings  Physical Exam:  Vital Signs:  Temp:  [97.4 F (36.3 C)-98.6 F (37 C)] 98.6 F (37 C) (01/10 0747) Pulse Rate:  [72-89] 89 (01/10 0747) Resp:  [16-18] 16 (01/10 0747) BP: (100-110)/(46-68) 108/46 (01/10 0747) SpO2:  [97 %-100 %] 100 % (01/10 0747) Weight:  [49.8 kg] 49.8 kg (01/10 0433) Blood pressure (!) 108/46, pulse 89, temperature 98.6 F (37 C), resp. rate 16, height 5' (1.524 m), weight 49.8 kg, SpO2 100%. Body mass index is 21.44 kg/m.   Physical Exam HENT:     Mouth/Throat:     Dentition: Abnormal dentition. Dental caries present.     Comments: Poor dentition with multiple caries and teeth missing Eyes:     Extraocular Movements: Extraocular movements intact.     Conjunctiva/sclera: Conjunctivae normal.  Pupils: Pupils are equal, round, and reactive to light.  Neurological:     Mental Status: She is oriented to person, place, and time.  Psychiatric:        Attention and Perception: She does not perceive auditory or visual hallucinations.        Mood and Affect: Mood is anxious. Mood is not depressed.  Affect is not labile or tearful.        Behavior: Behavior is not agitated, aggressive, hyperactive or combative. Behavior is cooperative.        Thought Content: Thought content is not paranoid or delusional. Thought content does not include homicidal or suicidal ideation.        Judgment: Judgment is not impulsive.    Mental Status Exam:   General Appearance: Disheveled, laying in bed   Orientation:  Full (Time, Place, and Person)  Memory:  Immediate;   Fair Remote;   Fair  Concentration:  Concentration: Fair and Attention Span: Fair  Recall:  Fair  Attention  Fair  Eye Contact:  Good  Speech:  normal rate clear and coherent  Language:  Good  Volume:  Normal  Mood:  I'm in pain   Affect:  Congruent, increased frustration d/t to pain   Thought Process:  Coherent   Thought Content:  Logical   Suicidal Thoughts:  No  Homicidal Thoughts:  No  Judgement:  Fair to pair   Insight:  Good   Psychomotor Activity: Intermittently fidgety, due to pain   Akathisia:  No  Fund of Knowledge:  Fair      Assets:  Manufacturing Systems Engineer Desire for Improvement Vocational/Educational Others:  religious affiliation   Cognition:  WNL  ADL's:  Intact  AIMS (if indicated):        Other History   These have been pulled in through the EMR, reviewed, and updated if appropriate.  Family History:  The patient's family history includes Depression in her mother; Diabetes in her paternal grandmother; Hyperlipidemia in her father; Hypertension in her mother.  Medical History: Past Medical History:  Diagnosis Date  . Abscess of aortic root   . ADHD   . Anxiety   . Bipolar 1 disorder, manic, moderate (HCC)   . Dissociative identity disorder (HCC)   . Endocarditis of mitral valve   . Endocarditis of tricuspid valve   . IV drug user   . OCD (obsessive compulsive disorder)   . Pregnant   . Scoliosis     Surgical History: Past Surgical History:  Procedure Laterality Date  . IR LUMBAR DISC  ASPIRATION W/IMG GUIDE  01/11/2023  . IR LUMBAR DISC ASPIRATION W/IMG GUIDE  01/14/2023  . IRRIGATION AND DEBRIDEMENT ELBOW Right 09/17/2019   Procedure: IRRIGATION AND DEBRIDEMENT RIGHT  ELBOW AND RIGHT WRIST;  Surgeon: Beverley Evalene BIRCH, MD;  Location: [facility] OR;  Service: Orthopedics;  Laterality: Right;  . KIDNEY STONE SURGERY    . RADIOLOGY WITH ANESTHESIA N/A 12/10/2020   Procedure: MRI WITH ANESTHESIA- THORACIC WITH AND WITHOUT CONTRAST CERVICAL WITH AND WITHOUT CONTRAST;  Surgeon: Radiologist, Medication, MD;  Location: [facility] OR;  Service: Radiology;  Laterality: N/A;  . RADIOLOGY WITH ANESTHESIA N/A 01/14/2023   Procedure: IR WITH ANESTHESIA;  Surgeon: Radiologist, Medication, MD;  Location: [facility] OR;  Service: Radiology;  Laterality: N/A;  . TEE WITHOUT CARDIOVERSION N/A 12/10/2020   Procedure: TRANSESOPHAGEAL ECHOCARDIOGRAM (TEE);  Surgeon: Hobart Powell BRAVO, MD;  Location: [facility] ENDOSCOPY;  Service: Cardiovascular;  Laterality: N/A;     Medications:  Current Facility-Administered Medications:  .  acetaminophen  (TYLENOL ) tablet 650 mg, 650 mg, Oral, Q6H PRN **OR** acetaminophen  (TYLENOL ) suppository 650 mg, 650 mg, Rectal, Q6H PRN, Reome, Earle J, RPH .  albuterol  (PROVENTIL ) (2.5 MG/3ML) 0.083% nebulizer solution 2.5 mg, 2.5 mg, Nebulization, Q6H PRN, Smith, Rondell A, MD .  bisacodyl  (DULCOLAX) EC tablet 5 mg, 5 mg, Oral, Daily PRN, Chavez, Abigail, NP .  bisacodyl  (DULCOLAX) suppository 10 mg, 10 mg, Rectal, Daily PRN, Akula, Vijaya, MD .  buprenorphine -naloxone  (SUBOXONE ) 8-2 mg per SL tablet 1 tablet, 1 tablet, Sublingual, BID, Cinderella, Margaret A, 1 tablet at 01/27/23 2114 .  ceFAZolin  (ANCEF ) IVPB 2g/100 mL premix, 2 g, Intravenous, Q8H, Fleeta Rothman, Jomarie SAILOR, MD, Last Rate: 200 mL/hr at 01/27/23 2123, 2 g at 01/27/23 2123 .  clonazePAM  (KLONOPIN ) tablet 0.5 mg, 0.5 mg, Oral, BID PRN, Laurence Locus, DO, 0.5 mg at 01/27/23 1553 .  cloNIDine  (CATAPRES ) tablet 0.1 mg, 0.1 mg, Oral, TID  PRN, Cinderella, Margaret A .  enoxaparin  (LOVENOX ) injection 40 mg, 40 mg, Subcutaneous, Q24H, Smith, Rondell A, MD, 40 mg at 01/27/23 2120 .  famotidine  (PEPCID ) tablet 40 mg, 40 mg, Oral, BID, Laurence Locus, DO, 40 mg at 01/27/23 2114 .  feeding supplement (ENSURE ENLIVE / ENSURE PLUS) liquid 237 mL, 237 mL, Oral, TID BM, Akula, Vijaya, MD, 237 mL at 01/27/23 2100 .  gabapentin  (NEURONTIN ) capsule 200 mg, 200 mg, Oral, BID, Laurence Locus, DO, 200 mg at 01/27/23 1553 .  gabapentin  (NEURONTIN ) capsule 600 mg, 600 mg, Oral, QHS, Laurence Locus, DO, 600 mg at 01/27/23 2115 .  hydrOXYzine  (ATARAX ) tablet 50 mg, 50 mg, Oral, TID PRN, Akula, Vijaya, MD, 50 mg at 01/27/23 2122 .  ibuprofen  (ADVIL ) tablet 400 mg, 400 mg, Oral, QID, Laurence Locus, DO, 400 mg at 01/27/23 2115 .  methocarbamol  (ROBAXIN ) tablet 500 mg, 500 mg, Oral, Q4H PRN, Laurence Locus, DO, 500 mg at 01/28/23 0219 .  metroNIDAZOLE  (FLAGYL ) tablet 500 mg, 500 mg, Oral, Q12H, [place], Jomarie SAILOR, MD, 500 mg at 01/27/23 2115 .  multivitamin with minerals tablet 1 tablet, 1 tablet, Oral, Daily, Viktoria Calkin, RPH, 1 tablet at 01/27/23 0950 .  ondansetron  (ZOFRAN -ODT) disintegrating tablet 8 mg, 8 mg, Oral, Q8H PRN, Laurence Locus, DO, 8 mg at 01/18/23 1731 .  polyethylene glycol (MIRALAX  / GLYCOLAX ) packet 17 g, 17 g, Oral, BID, Akula, Vijaya, MD, 17 g at 01/20/23 2340 .  QUEtiapine  (SEROQUEL ) tablet 200 mg, 200 mg, Oral, QHS, Fredia Dorothe HERO, MD, 200 mg at 01/27/23 2114 .  senna-docusate (Senokot-S) tablet 2 tablet, 2 tablet, Oral, BID, Akula, Vijaya, MD, 2 tablet at 01/27/23 0950 .  sodium chloride  flush (NS) 0.9 % injection 3 mL, 3 mL, Intravenous, Q12H, Smith, Rondell A, MD, 3 mL at 01/27/23 2100  Allergies: Allergies  Allergen Reactions  . Latex Swelling    PATTI OLDEN, MD PGY-1

## 2023-01-28 NOTE — Progress Notes (Signed)
 Patient continues to progress well towards d/c to home. Her pain and anxiety have been well managed in the past several days as observed by this RN vs a couple of weeks ago. Patient has had noticeable, increased c/o LBP this shift. Pt states she feels this increased pain is secondary to PT working on steps earlier during previous day shift and states she may have overdid it. Patient verbalized her concern regarding being d/c'd to home without appropriate pain control, as she fears she could be tempted to seek illegal drugs in desperation to control her pain (as she had in the past). This RN encouraged pt to speak honestly and candidly with her Providers/Care Team about her concerns and sincere desire to remain clean and off of all street drugs and that she must have the appropriate tools in place to help ensure she will not have a need/want to seek out illicit drugs when she id d/c'd to home. Patient verbalized understanding and states she will share her concerns and feelings, but she is afraid because of her past drug abuse people will not listen or take her concerns seriously. Will cont to monitor patient status. Detailed report of the previously mentioned given to Kristi, RN day shift nurse.

## 2023-01-31 ENCOUNTER — Ambulatory Visit: Payer: Self-pay | Admitting: Nurse Practitioner

## 2023-02-04 LAB — CULTURE, FUNGUS WITHOUT SMEAR

## 2023-02-07 ENCOUNTER — Ambulatory Visit (HOSPITAL_COMMUNITY): Payer: MEDICAID | Admitting: Psychiatry

## 2023-02-17 ENCOUNTER — Inpatient Hospital Stay: Payer: MEDICAID | Admitting: Family

## 2023-02-27 LAB — ACID FAST CULTURE WITH REFLEXED SENSITIVITIES (MYCOBACTERIA): Acid Fast Culture: NEGATIVE
